# Patient Record
Sex: Male | Born: 1950 | Race: Black or African American | Hispanic: No | Marital: Single | Smoking: Current every day smoker
Health system: Southern US, Community
[De-identification: ages and names within clinical notes are randomized; demographics above are authoritative.]

## PROBLEM LIST (undated history)

## (undated) DIAGNOSIS — E785 Hyperlipidemia, unspecified: Secondary | ICD-10-CM

## (undated) DIAGNOSIS — H269 Unspecified cataract: Secondary | ICD-10-CM

## (undated) DIAGNOSIS — C3492 Malignant neoplasm of unspecified part of left bronchus or lung: Secondary | ICD-10-CM

## (undated) DIAGNOSIS — J449 Chronic obstructive pulmonary disease, unspecified: Secondary | ICD-10-CM

## (undated) DIAGNOSIS — R05 Cough: Secondary | ICD-10-CM

## (undated) DIAGNOSIS — R0600 Dyspnea, unspecified: Secondary | ICD-10-CM

## (undated) DIAGNOSIS — Z86010 Personal history of colonic polyps: Secondary | ICD-10-CM

## (undated) DIAGNOSIS — R0609 Other forms of dyspnea: Secondary | ICD-10-CM

## (undated) DIAGNOSIS — R053 Chronic cough: Secondary | ICD-10-CM

## (undated) DIAGNOSIS — Z972 Presence of dental prosthetic device (complete) (partial): Secondary | ICD-10-CM

## (undated) DIAGNOSIS — R058 Other specified cough: Secondary | ICD-10-CM

## (undated) DIAGNOSIS — K5520 Angiodysplasia of colon without hemorrhage: Secondary | ICD-10-CM

## (undated) DIAGNOSIS — K08109 Complete loss of teeth, unspecified cause, unspecified class: Secondary | ICD-10-CM

## (undated) DIAGNOSIS — C61 Malignant neoplasm of prostate: Secondary | ICD-10-CM

## (undated) HISTORY — DX: Personal history of colonic polyps: Z86.010

## (undated) HISTORY — DX: Hyperlipidemia, unspecified: E78.5

## (undated) HISTORY — DX: Malignant neoplasm of unspecified part of left bronchus or lung: C34.92

## (undated) HISTORY — DX: Unspecified cataract: H26.9

## (undated) HISTORY — DX: Angiodysplasia of colon without hemorrhage: K55.20

## (undated) HISTORY — DX: Malignant neoplasm of prostate: C61

---

## 1998-04-02 ENCOUNTER — Ambulatory Visit (HOSPITAL_COMMUNITY): Admission: RE | Admit: 1998-04-02 | Discharge: 1998-04-02 | Payer: Self-pay | Admitting: Hematology and Oncology

## 1998-04-02 ENCOUNTER — Encounter: Admission: RE | Admit: 1998-04-02 | Discharge: 1998-04-02 | Payer: Self-pay | Admitting: *Deleted

## 1998-04-03 ENCOUNTER — Ambulatory Visit (HOSPITAL_COMMUNITY): Admission: RE | Admit: 1998-04-03 | Discharge: 1998-04-03 | Payer: Self-pay | Admitting: Hematology and Oncology

## 1998-04-08 ENCOUNTER — Encounter: Admission: RE | Admit: 1998-04-08 | Discharge: 1998-04-08 | Payer: Self-pay | Admitting: Hematology and Oncology

## 1998-04-25 ENCOUNTER — Ambulatory Visit (HOSPITAL_COMMUNITY): Admission: RE | Admit: 1998-04-25 | Discharge: 1998-04-25 | Payer: Self-pay | Admitting: *Deleted

## 2001-12-24 ENCOUNTER — Emergency Department (HOSPITAL_COMMUNITY): Admission: EM | Admit: 2001-12-24 | Discharge: 2001-12-24 | Payer: Self-pay | Admitting: *Deleted

## 2001-12-24 ENCOUNTER — Encounter: Payer: Self-pay | Admitting: *Deleted

## 2004-12-28 ENCOUNTER — Encounter: Admission: RE | Admit: 2004-12-28 | Discharge: 2005-01-06 | Payer: Self-pay | Admitting: Family Medicine

## 2007-11-21 ENCOUNTER — Encounter: Payer: Self-pay | Admitting: Internal Medicine

## 2008-04-24 ENCOUNTER — Ambulatory Visit: Payer: Self-pay | Admitting: Internal Medicine

## 2008-04-24 DIAGNOSIS — R195 Other fecal abnormalities: Secondary | ICD-10-CM

## 2008-04-30 HISTORY — PX: COLONOSCOPY: SHX174

## 2008-05-16 ENCOUNTER — Ambulatory Visit: Payer: Self-pay | Admitting: Internal Medicine

## 2008-05-16 ENCOUNTER — Encounter: Payer: Self-pay | Admitting: Internal Medicine

## 2008-05-29 ENCOUNTER — Encounter: Payer: Self-pay | Admitting: Internal Medicine

## 2014-01-28 ENCOUNTER — Encounter: Payer: Self-pay | Admitting: Internal Medicine

## 2014-01-28 ENCOUNTER — Ambulatory Visit (INDEPENDENT_AMBULATORY_CARE_PROVIDER_SITE_OTHER): Payer: Medicaid Other | Admitting: Internal Medicine

## 2014-01-28 VITALS — BP 148/76 | HR 72 | Ht 74.0 in | Wt 198.0 lb

## 2014-01-28 DIAGNOSIS — R590 Localized enlarged lymph nodes: Secondary | ICD-10-CM

## 2014-01-28 DIAGNOSIS — J439 Emphysema, unspecified: Secondary | ICD-10-CM

## 2014-01-28 DIAGNOSIS — R0609 Other forms of dyspnea: Secondary | ICD-10-CM

## 2014-01-28 DIAGNOSIS — R0600 Dyspnea, unspecified: Secondary | ICD-10-CM

## 2014-01-28 DIAGNOSIS — F17200 Nicotine dependence, unspecified, uncomplicated: Secondary | ICD-10-CM

## 2014-01-28 DIAGNOSIS — J438 Other emphysema: Secondary | ICD-10-CM

## 2014-01-28 DIAGNOSIS — J449 Chronic obstructive pulmonary disease, unspecified: Secondary | ICD-10-CM

## 2014-01-28 DIAGNOSIS — R0989 Other specified symptoms and signs involving the circulatory and respiratory systems: Secondary | ICD-10-CM

## 2014-01-28 DIAGNOSIS — Z720 Tobacco use: Secondary | ICD-10-CM | POA: Insufficient documentation

## 2014-01-28 DIAGNOSIS — R599 Enlarged lymph nodes, unspecified: Secondary | ICD-10-CM

## 2014-01-28 MED ORDER — FLUTICASONE FUROATE-VILANTEROL 100-25 MCG/INH IN AEPB: 1.0000 | INHALATION_SPRAY | Freq: Every day | RESPIRATORY_TRACT | Status: DC

## 2014-01-28 NOTE — Assessment & Plan Note (Signed)
Long term smoker with COPD and no comparison imaging from past for comparison. Possibility of bronchogenic CA is significant. No recent acute illness to cause reactive nodes. Rather than waiting for a couple of months for comparison image, or proceeding now with bronchoscopy when airways appear clear and there may not be visible endobronchial disease, it is more appropriate to get PET now. If cold, then therapy can focus on his COPD and smoking cessation.

## 2014-01-28 NOTE — Patient Instructions (Addendum)
Order- Office spirometry- done    Dx COPD with emphysema  Order- Schedule PET scan neck to thigh    Dx left hilar adenopathy, COPD with emphysema (radiologist to compare with CT from [facility] 01/03/14)  Sample x 2 Breo Ellipta Inhaler     One puff then rinse mouth, once time daily  Please stop smoking!

## 2014-01-28 NOTE — Assessment & Plan Note (Signed)
Counseling efforts begun

## 2014-01-28 NOTE — Assessment & Plan Note (Signed)
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  Plan- Sample [facility] for initial trial

## 2014-01-28 NOTE — Progress Notes (Signed)
01/28/14- 25 yoM 1PPD smoker, Pt referred courtesy of  [name], [place] at [facility]  for SOB and abnormal CT. Mr Larry [name] is moderately illiterate and unable to provide much information. He has been noting increasing DOE on hills and stairs for several months, and stable chronic daily cough with white phlegm over the past year. He denies blood, chest pain, fever or weight loss.  He doesn't know of previous lung or heart disease diagnosis, pneumonia or TB exposure. He does know his smoking has caused his problems. 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 Followed for BPH CT 01/03/14 [facility]- Left hilar adenopathy, etiology indeterminate, with patent airways, emphysema, miniscule left upper lobe nodules.  Office Spirometry- 01/28/14- severe obstructive airways disease. FVC 2.47/ 55%, FEV1 1.43/ 42%, FEV1/FVC 0.58, FEF25-75% 0.66/19%  Prior to Admission medications   Medication Sig Start Date End Date Taking? Authorizing Provider  pravastatin (PRAVACHOL) 80 MG tablet Take 80 mg by mouth daily.   Yes Historical Provider, MD  Fluticasone Furoate-Vilanterol (BREO ELLIPTA) 100-25 MCG/INH AEPB Inhale 1 puff into the lungs daily. 01/28/14   Deneise Lever, MD   Past Medical History  Diagnosis Date  . Hyperlipidemia    Past Surgical History  Procedure Laterality Date  . Colon surgery     History reviewed. No pertinent family history. History   Social History  . Marital Status: Single    Spouse Name: N/A    Number of Children: N/A  . Years of Education: N/A   Occupational History  . unemployed    Social History Main Topics  . Smoking status: Current Every Day Smoker -- 1.00 packs/day for 30 years    Types: Cigarettes  . Smokeless tobacco: Never Used  . Alcohol Use: No  . Drug Use: No  . Sexual Activity: Not on file   Other Topics Concern  . Not on file   Social History Narrative  . No narrative on file   ROS-see HPI Constitutional:   No-   weight loss,  night sweats, fevers, chills, fatigue, lassitude. HEENT:   No-  headaches, difficulty swallowing, tooth/dental problems, sore throat,       No-  sneezing, itching, ear ache, nasal congestion, post nasal drip,  CV:  No-   chest pain, orthopnea, PND, swelling in lower extremities, anasarca,                                  dizziness, palpitations Resp: +shortness of breath with exertion or at rest.            +productive cough,  No non-productive cough,  No- coughing up of blood.              No-   change in color of mucus.  No- wheezing.   Skin: No-   rash or lesions. GI:  No-   heartburn, indigestion, abdominal pain, nausea, vomiting, diarrhea,                 change in bowel habits, loss of appetite GU: No-   dysuria, change in color of urine, + urgency or frequency.  No- flank pain. MS:  No-   joint pain or swelling.  No- decreased range of motion.  No- back pain. Neuro-     nothing unusual Psych:  No- change in mood or affect. No depression or anxiety.  No memory loss.  OBJ- Physical Exam General-  Alert, Oriented, Affect-appropriate, Distress- none acute Skin- rash-none, lesions- none, excoriation- none Lymphadenopathy- none Head- +old scar R cheek            Eyes- Gross vision intact, PERRLA, conjunctivae and secretions clear            Ears- Hearing, canals-normal            Nose- Clear, no-Septal dev, mucus, polyps, erosion, perforation             Throat- Mallampati II , mucosa clear , drainage- none, tonsils- atrophic Neck- flexible , trachea midline, no stridor , thyroid nl, carotid no bruit Chest - symmetrical excursion , unlabored           Heart/CV- RRR , no murmur , no gallop  , no rub, nl s1 s2                           - JVD- none , edema- none, stasis changes- none, varices- none           Lung-  +diminished, Wheeze+mild, unlabored cough- none , dullness-none, rub- none           Chest wall-  Abd- tender-no, distended-no, bowel sounds-present, HSM- no Br/ Gen/ Rectal-  Not done, not indicated Extrem- cyanosis- none, clubbing, none, atrophy- none, strength- nl Neuro- grossly intact to observation

## 2014-02-05 ENCOUNTER — Encounter (HOSPITAL_COMMUNITY): Payer: Self-pay

## 2014-02-05 ENCOUNTER — Ambulatory Visit (HOSPITAL_COMMUNITY)
Admission: RE | Admit: 2014-02-05 | Discharge: 2014-02-05 | Disposition: A | Discharge status: Home / Self Care | Payer: Medicaid Other | Source: Ambulatory Visit | Attending: Internal Medicine | Admitting: Internal Medicine

## 2014-02-05 DIAGNOSIS — R599 Enlarged lymph nodes, unspecified: Secondary | ICD-10-CM | POA: Insufficient documentation

## 2014-02-05 DIAGNOSIS — J438 Other emphysema: Secondary | ICD-10-CM | POA: Diagnosis not present

## 2014-02-05 DIAGNOSIS — R918 Other nonspecific abnormal finding of lung field: Secondary | ICD-10-CM | POA: Diagnosis not present

## 2014-02-05 DIAGNOSIS — J439 Emphysema, unspecified: Secondary | ICD-10-CM

## 2014-02-05 LAB — GLUCOSE, CAPILLARY: GLUCOSE-CAPILLARY: 123 mg/dL — AB (ref 70–99)

## 2014-02-05 MED ORDER — FLUDEOXYGLUCOSE F - 18 (FDG) INJECTION: 9.6900 | Freq: Once | INTRAVENOUS | Status: AC | PRN

## 2014-03-18 ENCOUNTER — Telehealth: Payer: Self-pay | Admitting: Internal Medicine

## 2014-03-18 ENCOUNTER — Telehealth: Payer: Self-pay | Admitting: Pulmonary Disease

## 2014-03-18 ENCOUNTER — Ambulatory Visit (INDEPENDENT_AMBULATORY_CARE_PROVIDER_SITE_OTHER): Payer: Medicaid Other | Admitting: Internal Medicine

## 2014-03-18 ENCOUNTER — Encounter: Payer: Self-pay | Admitting: *Deleted

## 2014-03-18 ENCOUNTER — Ambulatory Visit (INDEPENDENT_AMBULATORY_CARE_PROVIDER_SITE_OTHER)
Admission: RE | Admit: 2014-03-18 | Discharge: 2014-03-18 | Disposition: A | Discharge status: Home / Self Care | Payer: Medicaid Other | Source: Ambulatory Visit | Attending: Internal Medicine | Admitting: Internal Medicine

## 2014-03-18 ENCOUNTER — Encounter: Payer: Self-pay | Admitting: Internal Medicine

## 2014-03-18 ENCOUNTER — Other Ambulatory Visit (INDEPENDENT_AMBULATORY_CARE_PROVIDER_SITE_OTHER): Payer: Medicaid Other

## 2014-03-18 VITALS — BP 108/78 | HR 74 | Ht 74.0 in | Wt 194.4 lb

## 2014-03-18 DIAGNOSIS — R911 Solitary pulmonary nodule: Secondary | ICD-10-CM | POA: Diagnosis not present

## 2014-03-18 DIAGNOSIS — R590 Localized enlarged lymph nodes: Secondary | ICD-10-CM

## 2014-03-18 DIAGNOSIS — R918 Other nonspecific abnormal finding of lung field: Secondary | ICD-10-CM

## 2014-03-18 DIAGNOSIS — J449 Chronic obstructive pulmonary disease, unspecified: Secondary | ICD-10-CM

## 2014-03-18 DIAGNOSIS — Z720 Tobacco use: Secondary | ICD-10-CM

## 2014-03-18 DIAGNOSIS — R599 Enlarged lymph nodes, unspecified: Secondary | ICD-10-CM

## 2014-03-18 LAB — CBC WITH DIFFERENTIAL/PLATELET
BASOS PCT: 0.6 % (ref 0.0–3.0)
Basophils Absolute: 0.1 10*3/uL (ref 0.0–0.1)
Eosinophils Absolute: 0.4 10*3/uL (ref 0.0–0.7)
Eosinophils Relative: 4.3 % (ref 0.0–5.0)
HCT: 42 % (ref 39.0–52.0)
Hemoglobin: 13.8 g/dL (ref 13.0–17.0)
LYMPHS ABS: 3.2 10*3/uL (ref 0.7–4.0)
Lymphocytes Relative: 33.1 % (ref 12.0–46.0)
MCHC: 32.8 g/dL (ref 30.0–36.0)
MCV: 81.3 fl (ref 78.0–100.0)
MONO ABS: 0.9 10*3/uL (ref 0.1–1.0)
MONOS PCT: 9.2 % (ref 3.0–12.0)
Neutro Abs: 5.2 10*3/uL (ref 1.4–7.7)
Neutrophils Relative %: 52.8 % (ref 43.0–77.0)
PLATELETS: 214 10*3/uL (ref 150.0–400.0)
RBC: 5.17 Mil/uL (ref 4.22–5.81)
RDW: 15.1 % (ref 11.5–15.5)
WBC: 9.8 10*3/uL (ref 4.0–10.5)

## 2014-03-18 LAB — BASIC METABOLIC PANEL
BUN: 10 mg/dL (ref 6–23)
CO2: 28 meq/L (ref 19–32)
Calcium: 9.3 mg/dL (ref 8.4–10.5)
Chloride: 101 mEq/L (ref 96–112)
Creatinine, Ser: 0.8 mg/dL (ref 0.4–1.5)
GFR: 118.23 mL/min (ref >60.00)
GLUCOSE: 100 mg/dL — AB (ref 70–99)
POTASSIUM: 4.2 meq/L (ref 3.5–5.1)
SODIUM: 139 meq/L (ref 135–145)

## 2014-03-18 LAB — PROTIME-INR
INR: 1.1 ratio — ABNORMAL HIGH (ref 0.8–1.0)
Prothrombin Time: 11.7 s (ref 9.6–13.1)

## 2014-03-18 LAB — APTT: APTT: 28.9 s (ref 23.4–32.7)

## 2014-03-18 MED ORDER — FLUTICASONE FUROATE-VILANTEROL 100-25 MCG/INH IN AEPB
1.0000 | INHALATION_SPRAY | Freq: Every day | RESPIRATORY_TRACT | Status: DC
Start: 2014-03-18 — End: 2014-03-21

## 2014-03-18 NOTE — Assessment & Plan Note (Signed)
Confirmed with abnormal PET scan Dr Gwenette Greet has reviewed at my request and will contact the patient a bput possible  bronchoscopy

## 2014-03-18 NOTE — Patient Instructions (Addendum)
Flu vax  Order- CXR   Dx Lung nodules  Order- lab- Pt/INR. PTT, CBC w diff, BMET, liver enzymes      Dx lung nodules  Script for Breo Ellipta Inhaler   1 puff, then rinse mouth, once daily  I will be asking one of the doctors here to call about scheduling a bronchpscopy test to look in your lungs and try to get biopsy samples of the lung nodules

## 2014-03-18 NOTE — Progress Notes (Signed)
01/28/14- 2 yoM 1PPD smoker, Pt referred courtesy of  [name], [place] at [facility]  for SOB and abnormal CT. Larry [name] is moderately illiterate and unable to provide much information. He has been noting increasing DOE on hills and stairs for several months, and stable chronic daily cough with white phlegm over the past year. He denies blood, chest pain, fever or weight loss.  He doesn't know of previous lung or heart disease diagnosis, pneumonia or TB exposure. He does know his smoking has caused his problems. 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 Followed for BPH CT 01/03/14 [facility]- Left hilar adenopathy, etiology indeterminate, with patent airways, emphysema, miniscule left upper lobe nodules.  Office Spirometry- 01/28/14- severe obstructive airways disease. FVC 2.47/ 55%, FEV1 1.43/ 42%, FEV1/FVC 0.58, FEF25-75% 0.66/19%  03/18/14- 58 yoM 1PPD smoker followed for lung mass, COPD, tobacco use, illiterate FOLLOWS FOR: Pt states he keeps "a cold" year round; review PET scan with patient in detail. Refill needed for [facility] sent to pharmacy as he felt the inhaler helped his breathing.  He denies fever, chest pain, adenopathy, blood. We reviewed PET scan. I asked about problem of blood in stool on problem list. He denies this his current in chart lists benign endoscopies in 2009. PET scan 02/05/14 IMPRESSION:  1. Enlarged hypermetabolic nodule versus lymph node in the left  suprahilar location is concerning for metastatic adenopathy.  2. Small hypermetabolic nodular focus in the left upper lobe could  represent a primary bronchogenic carcinoma.  3. Hypermetabolic nodular focus in the superior segment of the right  lower lobe could represent a second primary neoplasm versus focus of  infection.  4. Low metabolic activity of small mediastinal favors reactive  adenopathy.  5. These central lesions would likely be accessible for tissue  sampling by bronchoscopy.   Electronically Signed  By: Suzy Bouchard M.D.   On: 02/05/2014 09:04  ROS-see HPI Constitutional:   No-   weight loss, night sweats, fevers, chills, fatigue, lassitude. HEENT:   No-  headaches, difficulty swallowing, tooth/dental problems, sore throat,       No-  sneezing, itching, ear ache, nasal congestion, post nasal drip,  CV:  No-   chest pain, orthopnea, PND, swelling in lower extremities, anasarca,                                  dizziness, palpitations Resp: +shortness of breath with exertion or at rest.            +productive cough,  No non-productive cough,  No- coughing up of blood.              No-   change in color of mucus.  No- wheezing.   Skin: No-   rash or lesions. GI:  No-   heartburn, indigestion, abdominal pain, nausea, vomiting,  GU: . MS:  No-   joint pain or swelling.   Neuro-     nothing unusual Psych:  No- change in mood or affect. No depression or anxiety.  No memory loss.  OBJ- Physical Exam General- Alert, Oriented, Affect-appropriate, Distress- none acute, +odor tobacco Skin- rash-none, lesions- none, excoriation- none Lymphadenopathy- none Head- +old scar R cheek            Eyes- Gross vision intact, PERRLA, conjunctivae and secretions clear            Ears- Hearing, canals-normal  Nose- Clear, no-Septal dev, mucus, polyps, erosion, perforation             Throat- Mallampati II , mucosa clear , drainage- none, tonsils- atrophic Neck- flexible , trachea midline, no stridor , thyroid nl, carotid no bruit Chest - symmetrical excursion , unlabored           Heart/CV- RRR , no murmur , no gallop  , no rub, nl s1 s2                           - JVD- none , edema- none, stasis changes- none, varices- none           Lung-  +diminished, Wheeze+-none, unlabored cough- none , dullness-none, rub- none           Chest wall-  Abd-  Br/ Gen/ Rectal- Not done, not indicated Extrem- cyanosis- none, clubbing, none, atrophy- none, strength- nl Neuro-  grossly intact to observation

## 2014-03-18 NOTE — Telephone Encounter (Signed)
Pt is aware that I have scheduled him with CY on 04-19-14 at 11:15am. I have sent a letter as a reminder as well. Also, patient states his [facility] Rx is not at the pharmacy. I have called the Rx to the [facility]. Nothing more needed at this time.

## 2014-03-18 NOTE — Assessment & Plan Note (Signed)
Not sure that these nodules can be reached. CT and clinical history do not indicate obvious endobronchial disease. Guided bronchoscopy, versus needle biopsy, versus observation strategy options discussed with the patient. I discussed bronchoscopy with him. Dr Gwenette Greet is reviewing and can talk with the patient about possible guided bronch, if lung function allows.

## 2014-03-18 NOTE — Telephone Encounter (Signed)
Larry [name], would like to get this pt in to discuss his xray findings.  Dr. Annamaria Boots asked me to see him about the abnormal xray.  He may not understand you, and we may have to send him a letter to get in.  Thanks.

## 2014-03-18 NOTE — Assessment & Plan Note (Signed)
Ongoing. He is not yet motivated to quit

## 2014-03-18 NOTE — Assessment & Plan Note (Signed)
Bronchitis and emphysema features Office Spirometry- 01/28/14- severe obstructive airways disease. FVC 2.47/ 55%, FEV1 1.43/ 42%, FEV1/FVC 0.58, FEF25-75% 0.66/19% He found Breo helpful Plan-Breo Ellipta, flu shot

## 2014-03-19 LAB — HEPATIC FUNCTION PANEL
ALBUMIN: 3.7 g/dL (ref 3.5–5.2)
ALT: 12 U/L (ref 0–53)
AST: 22 U/L (ref 0–37)
Alkaline Phosphatase: 80 U/L (ref 39–117)
Bilirubin, Direct: 0.1 mg/dL (ref 0.0–0.3)
Total Bilirubin: 0.4 mg/dL (ref 0.2–1.2)
Total Protein: 8.4 g/dL — ABNORMAL HIGH (ref 6.0–8.3)

## 2014-03-19 NOTE — Telephone Encounter (Signed)
I called pt. He is scheduled to see [facility] 03/21/14 at 11:45 but will arrive at 11:30. Pt is aware of our address. Will forward to [facility] as an [place]

## 2014-03-21 ENCOUNTER — Encounter: Payer: Self-pay | Admitting: Pulmonary Disease

## 2014-03-21 ENCOUNTER — Telehealth: Payer: Self-pay | Admitting: *Deleted

## 2014-03-21 ENCOUNTER — Ambulatory Visit (INDEPENDENT_AMBULATORY_CARE_PROVIDER_SITE_OTHER): Payer: Medicaid Other | Admitting: Pulmonary Disease

## 2014-03-21 VITALS — BP 140/76 | HR 80 | Temp 98.0°F | Ht 74.0 in | Wt 193.8 lb

## 2014-03-21 DIAGNOSIS — R918 Other nonspecific abnormal finding of lung field: Secondary | ICD-10-CM

## 2014-03-21 DIAGNOSIS — R599 Enlarged lymph nodes, unspecified: Secondary | ICD-10-CM

## 2014-03-21 DIAGNOSIS — R590 Localized enlarged lymph nodes: Secondary | ICD-10-CM

## 2014-03-21 MED ORDER — BUDESONIDE-FORMOTEROL FUMARATE 160-4.5 MCG/ACT IN AERO: 2.0000 | INHALATION_SPRAY | Freq: Two times a day (BID) | RESPIRATORY_TRACT | Status: DC

## 2014-03-21 NOTE — Progress Notes (Signed)
   Subjective:    Patient ID: Larry [name], male    DOB: 1950/06/30, 63 y.o.   MRN: [id number]  HPI The patient is a 63 year old male who I've been asked to see for pulmonary nodules and lymphadenopathy.  The patient has been diagnosed with COPD, and is being followed by Dr. Annamaria Boots.  The patient was found to have an abnormal left hilar density on chest x-ray, and this was verified on CT chest. This scan is not available for my review, but the report shows a left hilar lymph node enlargement, as well as nodular densities in the left upper lobe. The patient has subsequently had a PET scan showing significant uptake in the left hilar lymph node, and 69mm left upper lobe nodule with mildly increased uptake, as well as at 15 mm nodule in the superior segment of the right lower lobe with borderline uptake. The patient tells me that he has an intermittent cough but has not had hemoptysis. Unfortunately he has continued to smoke. He tells me that his weight has been up and down, but he thinks he has lost about 17 pounds this year. He denies any issues with his appetite.   Review of Systems  Constitutional: Negative for fever and unexpected weight change.  HENT: Negative for congestion, dental problem, ear pain, nosebleeds, postnasal drip, rhinorrhea, sinus pressure, sneezing, sore throat and trouble swallowing.   Eyes: Negative for redness and itching.  Respiratory: Positive for cough and shortness of breath. Negative for chest tightness and wheezing.   Cardiovascular: Negative for palpitations and leg swelling.  Gastrointestinal: Negative for nausea and vomiting.  Genitourinary: Negative for dysuria.  Musculoskeletal: Negative for joint swelling.  Skin: Negative for rash.  Neurological: Negative for headaches.  Hematological: Does not bruise/bleed easily.  Psychiatric/Behavioral: Negative for dysphoric mood. The patient is not nervous/anxious.        Objective:   Physical Exam  Constitutional:   Well developed, no acute distress  HENT:  Nares patent without discharge  Oropharynx without exudate, palate and uvula are normal  Eyes:  Perrla, eomi, no scleral icterus  Neck:  No JVD, no TMG  Cardiovascular:  Normal rate, regular rhythm, no rubs or gallops.  No murmurs        Intact distal pulses  Pulmonary :  Mildly decreased breath sounds, no stridor or respiratory distress   No rales, rhonchi, or wheezing  Abdominal:  Soft, nondistended, bowel sounds present.  No tenderness noted.   Musculoskeletal:  No lower extremity edema noted.  Lymph Nodes:  No cervical lymphadenopathy noted  Skin:  No cyanosis noted  Neurologic:  Alert, appropriate, moves all 4 extremities without obvious deficit.        Assessment & Plan:

## 2014-03-21 NOTE — Assessment & Plan Note (Signed)
The patient has left hilar lymphadenopathy on his CT and PET scan that has abnormal uptake. He also has 2 nodules that are 23mm in the left upper lobe and 15 mm in the superior segment of the right lower lobe. I have tried to reviewed the various ways to obtain tissue diagnosis, and unfortunately the nodules are small and in a difficult position. Be a millimeter nodule in the left upper lobe is approachable by navigational bronchoscopy, but its size makes the yield very poor.  The superior segment of the right lower lobe nodule is in a very difficult position, and has a poor yield even with navigational bronchoscopy. I do think the left hilar lymph node is approachable with endobronchial ultrasound, and have recommended this procedure along with traditional bronchoscopy to try and approach the nodules in question. I have discussed with him the procedure in detail, and reviewed the potential complications. The patient is agreeable to this approach.

## 2014-03-21 NOTE — Patient Instructions (Signed)
Will arrange for bronchoscopy to get a biopsy of your abnormal lung spot.  Will check the schedule, and call you.  Work on stopping smoking.

## 2014-03-21 NOTE — Telephone Encounter (Signed)
Pt was seen in the office by [facility] today. Pt was told by the pharmacy BREO is not covered under his insurance.  Spoke with CDY. He wants to change pt to symbicort 160 2 puffs BID.  Explained inhaler and technique to pt. RX sent to the pharmacy.

## 2014-03-22 ENCOUNTER — Encounter: Payer: Self-pay | Admitting: Pulmonary Disease

## 2014-03-22 ENCOUNTER — Telehealth: Payer: Self-pay | Admitting: Pulmonary Disease

## 2014-03-22 NOTE — Telephone Encounter (Signed)
Please see if there is an EBUS slot at [place] at 730 am On NOV 9th.  Let me know and I can give you pt info to make contact.

## 2014-03-22 NOTE — Telephone Encounter (Signed)
Will give to [facility] on Monday 03/25/14 Dawne J Law

## 2014-03-25 ENCOUNTER — Encounter (HOSPITAL_COMMUNITY): Payer: Self-pay | Admitting: *Deleted

## 2014-03-25 NOTE — Telephone Encounter (Signed)
EBUS @[facility]  04/02/14@7 :30am Joellen Jersey

## 2014-03-29 NOTE — Progress Notes (Signed)
Quick Note:  ATC pt. Line rang then stated " person you are trying to call is unavailable." WCB. ______

## 2014-03-29 NOTE — Progress Notes (Signed)
Quick Note:  ATC pt. Line rang then stated " person you are trying to call is unavailable." WCB.  ______

## 2014-04-07 NOTE — Anesthesia Preprocedure Evaluation (Addendum)
Anesthesia Evaluation  Patient identified by MRN, date of birth, ID band Patient awake    Reviewed: Allergy & Precautions, H&P , NPO status , Patient's Chart, lab work & pertinent test results  Airway Mallampati: II       Dental  (+) Edentulous Upper, Edentulous Lower   Pulmonary COPDCurrent Smoker (30+ pack year),  PFT FVC 42% FEV25 only 22% both obstructive and restrictive, 2nd COPD smoker breath sounds clear to auscultation        Cardiovascular Rhythm:Regular     Neuro/Psych    GI/Hepatic   Endo/Other    Renal/GU      Musculoskeletal   Abdominal (+)  Abdomen: soft.    Peds  Hematology   Anesthesia Other Findings   Reproductive/Obstetrics                            Anesthesia Physical Anesthesia Plan  ASA: III  Anesthesia Plan: General   Post-op Pain Management:    Induction: Intravenous  Airway Management Planned: Oral ETT  Additional Equipment:   Intra-op Plan:   Post-operative Plan:   Informed Consent: I have reviewed the patients History and Physical, chart, labs and discussed the procedure including the risks, benefits and alternatives for the proposed anesthesia with the patient or authorized representative who has indicated his/her understanding and acceptance.     Plan Discussed with:   Anesthesia Plan Comments: (For mediastinal node BX, good IV, EKG if not done, 8.5 0r larger ET tube)        Anesthesia Quick Evaluation

## 2014-04-08 ENCOUNTER — Ambulatory Visit (HOSPITAL_COMMUNITY)
Admission: RE | Admit: 2014-04-08 | Discharge: 2014-04-08 | Disposition: A | Discharge status: Home / Self Care | Payer: Medicaid Other | Source: Ambulatory Visit | Attending: Pulmonary Disease | Admitting: Pulmonary Disease

## 2014-04-08 ENCOUNTER — Encounter (HOSPITAL_COMMUNITY)
Admission: RE | Disposition: A | Discharge status: Home / Self Care | Payer: Self-pay | Source: Ambulatory Visit | Attending: Pulmonary Disease

## 2014-04-08 ENCOUNTER — Ambulatory Visit (HOSPITAL_COMMUNITY): Payer: Medicaid Other | Admitting: Anesthesiology

## 2014-04-08 ENCOUNTER — Encounter (HOSPITAL_COMMUNITY): Payer: Self-pay | Admitting: *Deleted

## 2014-04-08 DIAGNOSIS — R590 Localized enlarged lymph nodes: Secondary | ICD-10-CM | POA: Diagnosis present

## 2014-04-08 DIAGNOSIS — J449 Chronic obstructive pulmonary disease, unspecified: Secondary | ICD-10-CM | POA: Insufficient documentation

## 2014-04-08 DIAGNOSIS — R918 Other nonspecific abnormal finding of lung field: Secondary | ICD-10-CM | POA: Diagnosis not present

## 2014-04-08 DIAGNOSIS — F17210 Nicotine dependence, cigarettes, uncomplicated: Secondary | ICD-10-CM | POA: Diagnosis not present

## 2014-04-08 DIAGNOSIS — R599 Enlarged lymph nodes, unspecified: Secondary | ICD-10-CM | POA: Insufficient documentation

## 2014-04-08 HISTORY — DX: Chronic obstructive pulmonary disease, unspecified: J44.9

## 2014-04-08 HISTORY — PX: ENDOBRONCHIAL ULTRASOUND: SHX5096

## 2014-04-08 SURGERY — ENDOBRONCHIAL ULTRASOUND (EBUS)
Anesthesia: General | Laterality: Bilateral

## 2014-04-08 MED ORDER — DEXAMETHASONE SODIUM PHOSPHATE 10 MG/ML IJ SOLN
INTRAMUSCULAR | Status: AC
  Filled 2014-04-08: qty 1

## 2014-04-08 MED ORDER — SUCCINYLCHOLINE CHLORIDE 20 MG/ML IJ SOLN
INTRAMUSCULAR | Status: DC | PRN
  Administered 2014-04-08: 100 mg via INTRAVENOUS

## 2014-04-08 MED ORDER — DEXAMETHASONE SODIUM PHOSPHATE 10 MG/ML IJ SOLN
INTRAMUSCULAR | Status: DC | PRN
  Administered 2014-04-08: 10 mg via INTRAVENOUS

## 2014-04-08 MED ORDER — LIDOCAINE HCL (CARDIAC) 20 MG/ML IV SOLN
INTRAVENOUS | Status: AC
  Filled 2014-04-08: qty 5

## 2014-04-08 MED ORDER — PROMETHAZINE HCL 25 MG/ML IJ SOLN: 6.2500 mg | INTRAMUSCULAR | Status: DC | PRN

## 2014-04-08 MED ORDER — FENTANYL CITRATE 0.05 MG/ML IJ SOLN: 25.0000 ug | INTRAMUSCULAR | Status: DC | PRN

## 2014-04-08 MED ORDER — MIDAZOLAM HCL 2 MG/2ML IJ SOLN
INTRAMUSCULAR | Status: AC
  Filled 2014-04-08: qty 2

## 2014-04-08 MED ORDER — MEPERIDINE HCL 100 MG/ML IJ SOLN
6.2500 mg | INTRAMUSCULAR | Status: DC | PRN
Start: 2014-04-08 — End: 2014-04-08

## 2014-04-08 MED ORDER — LACTATED RINGERS IV SOLN
INTRAVENOUS | Status: DC
  Administered 2014-04-08: 1000 mL via INTRAVENOUS

## 2014-04-08 MED ORDER — ONDANSETRON HCL 4 MG/2ML IJ SOLN
INTRAMUSCULAR | Status: DC | PRN
  Administered 2014-04-08: 4 mg via INTRAVENOUS

## 2014-04-08 MED ORDER — PROPOFOL 10 MG/ML IV BOLUS
INTRAVENOUS | Status: AC
  Filled 2014-04-08: qty 20

## 2014-04-08 MED ORDER — FENTANYL CITRATE 0.05 MG/ML IJ SOLN
INTRAMUSCULAR | Status: AC
  Filled 2014-04-08: qty 2

## 2014-04-08 MED ORDER — FENTANYL CITRATE 0.05 MG/ML IJ SOLN
INTRAMUSCULAR | Status: DC | PRN
  Administered 2014-04-08 (×4): 50 ug via INTRAVENOUS

## 2014-04-08 MED ORDER — LIDOCAINE HCL (PF) 2 % IJ SOLN
INTRAMUSCULAR | Status: DC | PRN
  Administered 2014-04-08: 30 mg via INTRADERMAL

## 2014-04-08 MED ORDER — PROPOFOL 10 MG/ML IV BOLUS
INTRAVENOUS | Status: DC | PRN
  Administered 2014-04-08: 70 mg via INTRAVENOUS
  Administered 2014-04-08: 130 mg via INTRAVENOUS

## 2014-04-08 MED ORDER — ONDANSETRON HCL 4 MG/2ML IJ SOLN
INTRAMUSCULAR | Status: AC
  Filled 2014-04-08: qty 2

## 2014-04-08 NOTE — H&P (View-Only) (Signed)
   Subjective:    Patient ID: Larry [name], male    DOB: 1950-07-18, 63 y.o.   MRN: [id number]  HPI The patient is a 63 year old male who I've been asked to see for pulmonary nodules and lymphadenopathy.  The patient has been diagnosed with COPD, and is being followed by Dr. Annamaria Boots.  The patient was found to have an abnormal left hilar density on chest x-ray, and this was verified on CT chest. This scan is not available for my review, but the report shows a left hilar lymph node enlargement, as well as nodular densities in the left upper lobe. The patient has subsequently had a PET scan showing significant uptake in the left hilar lymph node, and 25mm left upper lobe nodule with mildly increased uptake, as well as at 15 mm nodule in the superior segment of the right lower lobe with borderline uptake. The patient tells me that he has an intermittent cough but has not had hemoptysis. Unfortunately he has continued to smoke. He tells me that his weight has been up and down, but he thinks he has lost about 17 pounds this year. He denies any issues with his appetite.   Review of Systems  Constitutional: Negative for fever and unexpected weight change.  HENT: Negative for congestion, dental problem, ear pain, nosebleeds, postnasal drip, rhinorrhea, sinus pressure, sneezing, sore throat and trouble swallowing.   Eyes: Negative for redness and itching.  Respiratory: Positive for cough and shortness of breath. Negative for chest tightness and wheezing.   Cardiovascular: Negative for palpitations and leg swelling.  Gastrointestinal: Negative for nausea and vomiting.  Genitourinary: Negative for dysuria.  Musculoskeletal: Negative for joint swelling.  Skin: Negative for rash.  Neurological: Negative for headaches.  Hematological: Does not bruise/bleed easily.  Psychiatric/Behavioral: Negative for dysphoric mood. The patient is not nervous/anxious.        Objective:   Physical Exam  Constitutional:   Well developed, no acute distress  HENT:  Nares patent without discharge  Oropharynx without exudate, palate and uvula are normal  Eyes:  Perrla, eomi, no scleral icterus  Neck:  No JVD, no TMG  Cardiovascular:  Normal rate, regular rhythm, no rubs or gallops.  No murmurs        Intact distal pulses  Pulmonary :  Mildly decreased breath sounds, no stridor or respiratory distress   No rales, rhonchi, or wheezing  Abdominal:  Soft, nondistended, bowel sounds present.  No tenderness noted.   Musculoskeletal:  No lower extremity edema noted.  Lymph Nodes:  No cervical lymphadenopathy noted  Skin:  No cyanosis noted  Neurologic:  Alert, appropriate, moves all 4 extremities without obvious deficit.        Assessment & Plan:

## 2014-04-08 NOTE — Transfer of Care (Signed)
Immediate Anesthesia Transfer of Care Note  Patient: Larry [name]  Procedure(s) Performed: Procedure(s) (LRB): ENDOBRONCHIAL ULTRASOUND (Bilateral)  Patient Location: PACU  Anesthesia Type: General  Level of Consciousness: sedated, patient cooperative and responds to stimulation  Airway & Oxygen Therapy: Patient Spontanous Breathing and Patient connected to face mask oxgen  Post-op Assessment: Report given to PACU RN and Post -op Vital signs reviewed and stable  Post vital signs: Reviewed and stable  Complications: No apparent anesthesia complications

## 2014-04-08 NOTE — Progress Notes (Signed)
Video Bronchoscopy done  Post EBUS procedure  Intervention Bronchial washing done Intervention Bronchial brushing done

## 2014-04-08 NOTE — Op Note (Signed)
NAME:  CHASON, MCIVER NO.:  [id number]  MEDICAL RECORD NO.:  [id number]  LOCATION:                               FACILITY:  [facility]  PHYSICIAN:  Kathee Delton, MD,FCCPDATE OF BIRTH:  13-[birth year]1949  DATE OF PROCEDURE:  04/08/2014 DATE OF DISCHARGE:  04/08/2014                              OPERATIVE REPORT   PROCEDURE:  Flexible fiberoptic bronchoscopy with biopsy and endobronchial ultrasound.  OPERATOR:  Kathee Delton, MD, FCCP  INDICATION FOR THE PROCEDURE:  Left hilar mass/adenopathy with pulmonary nodules.  ANESTHESIA:  General with endotracheal intubation.  DESCRIPTION OF PROCEDURE:  After obtaining informed consent and under close cardiopulmonary monitoring, the patient underwent general anesthesia with endotracheal intubation for the procedure.  The standard fiberoptic bronchoscope was passed through the endotracheal tube and an examination of the airways was done which showed no endobronchial abnormality.  The scope was then removed and the endobronchial ultrasound scope was passed through the endotracheal tube and into the left tracheobronchial tree.  The scope was passed into the left upper lobe where the lymph node/mass in question were easily imaged with ultrasound.  However, when the biopsy needle was passed through the scope, I was unable to flex the ultrasound probe to the proper location for imaging because of increased stiffness of the scope.  Multiple attempts were tried with various maneuvers, however, there was no way to get the ultrasound probe into the proper location with the biopsy needle within the channel.  Biopsy of the 10L station was not able to be done. The scope was then passed into the right tracheobronchial tree and into the superior segment of the right lower lobe where brushes and lavage were done in the area of the abnormality seen on PET scanning.  Overall, the patient tolerated the procedure well and there were  no complications.     Kathee Delton, MD,FCCP     [facility]/MEDQ  D:  04/08/2014  T:  04/08/2014  Job:  [id number]

## 2014-04-08 NOTE — Discharge Instructions (Signed)
Flexible Bronchoscopy, Care After These instructions give you information on caring for yourself after your procedure. Your doctor may also give you more specific instructions. Call your doctor if you have any problems or questions after your procedure. HOME CARE  Do not eat or drink anything for 2 hours after your procedure. If you try to eat or drink before the medicine wears off, food or drink could go into your lungs. You could also burn yourself.  After 2 hours have passed and when you can cough and gag normally, you may eat soft food and drink liquids slowly.  The day after the test, you may eat your normal diet.  You may do your normal activities.  Keep all doctor visits. GET HELP RIGHT AWAY IF:  You get more and more short of breath.  You get light-headed.  You feel like you are going to pass out (faint).  You have chest pain.  You have new problems that worry you.  You cough up more than a little blood.  You cough up more blood than before. MAKE SURE YOU:  Understand these instructions.  Will watch your condition.  Will get help right away if you are not doing well or get worse. Document Released: 03/14/2009 Document Revised: 05/22/2013 Document Reviewed: 01/19/2013 [facility] Patient Information 2015 [place], [place]. This information is not intended to replace advice given to you by your health care provider. Make sure you discuss any questions you have with your health care provider.   General Anesthesia, Care After Refer to this sheet in the next few weeks. These instructions provide you with information on caring for yourself after your procedure. Your health care provider may also give you more specific instructions. Your treatment has been planned according to current medical practices, but problems sometimes occur. Call your health care provider if you have any problems or questions after your procedure. WHAT TO EXPECT AFTER THE PROCEDURE After the procedure,  it is typical to experience:  Sleepiness.  Nausea and vomiting. HOME CARE INSTRUCTIONS  For the first 24 hours after general anesthesia:  Have a responsible person with you.  Do not drive a car. If you are alone, do not take public transportation.  Do not drink alcohol.  Do not take medicine that has not been prescribed by your health care provider.  Do not sign important papers or make important decisions.  You may resume a normal diet and activities as directed by your health care provider.  Change bandages (dressings) as directed.  If you have questions or problems that seem related to general anesthesia, call the hospital and ask for the anesthetist or anesthesiologist on call. SEEK MEDICAL CARE IF:  You have nausea and vomiting that continue the day after anesthesia.  You develop a rash. SEEK IMMEDIATE MEDICAL CARE IF:   You have difficulty breathing.  You have chest pain.  You have any allergic problems. Document Released: 08/23/2000 Document Revised: 05/22/2013 Document Reviewed: 11/30/2012 [facility] Patient Information 2015 [place], [place]. This information is not intended to replace advice given to you by your health care provider. Make sure you discuss any questions you have with your health care provider.

## 2014-04-08 NOTE — Interval H&P Note (Signed)
History and Physical Interval Note:  04/08/2014 7:30 AM  Larry [name]  has presented today for surgery, with the diagnosis of lung mass  The various methods of treatment have been discussed with the patient and family. After consideration of risks, benefits and other options for treatment, the patient has consented to  Procedure(s): ENDOBRONCHIAL ULTRASOUND (Bilateral) as a surgical intervention .  The patient's history has been reviewed, patient examined, no change in status, stable for surgery.  I have reviewed the patient's chart and labs.  Questions were answered to the patient's satisfaction.     Kathee Delton

## 2014-04-08 NOTE — Op Note (Signed)
Dictation #:  [id number]

## 2014-04-08 NOTE — Anesthesia Postprocedure Evaluation (Signed)
  Anesthesia Post-op Note  Patient: Larry [name]  Procedure(s) Performed: Procedure(s): ENDOBRONCHIAL ULTRASOUND (Bilateral)  Patient Location: PACU  Anesthesia Type:General  Level of Consciousness: awake  Airway and Oxygen Therapy: Patient Spontanous Breathing  Post-op Pain: none  Post-op Assessment: Post-op Vital signs reviewed and Patient's Cardiovascular Status Stable  Post-op Vital Signs: Reviewed and stable  Last Vitals:  Filed Vitals:   04/08/14 0910  BP: 143/85  Pulse: 88  Temp:   Resp: 13    Complications: No apparent anesthesia complications

## 2014-04-08 NOTE — Op Note (Deleted)
NAME:  Larry [name], Larry [name] NO.:  [id number]  MEDICAL RECORD NO.:  [id number]  LOCATION:                               FACILITY:  [facility]  PHYSICIAN:  Kathee Delton, MD,FCCPDATE OF BIRTH:  02-20[birth year]50  DATE OF PROCEDURE:  04/08/2014 DATE OF DISCHARGE:  04/08/2014                              OPERATIVE REPORT   PROCEDURE:  Flexible fiberoptic bronchoscopy with biopsy and endobronchial ultrasound.  OPERATOR:  Kathee Delton, MD, FCCP  INDICATION FOR THE PROCEDURE:  Left hilar mass/adenopathy with pulmonary nodules.  ANESTHESIA:  General with endotracheal intubation.  DESCRIPTION OF PROCEDURE:  After obtaining informed consent and under close cardiopulmonary monitoring, the patient underwent general anesthesia with endotracheal intubation for the procedure.  The standard fiberoptic bronchoscope was passed through the endotracheal tube and an examination of the airways was done which showed no endobronchial abnormality.  The scope was then removed and the endobronchial ultrasound scope was passed through the endotracheal tube and into the left tracheobronchial tree.  The scope was passed into the left upper lobe where the lymph node/mass in question were easily imaged with ultrasound.  However, when the biopsy needle was passed through the scope, I was unable to flex the ultrasound probe to the proper location for imaging because of increased stiffness of the scope.  Multiple attempts were tried with various maneuvers, however, there was no way to get the ultrasound probe into the proper location with the biopsy needle within the channel.  Biopsy of the 10L station was not able to be done. The scope was then passed into the right tracheobronchial tree and into the superior segment of the right lower lobe where brushes and lavage were done in the area of the abnormality seen on PET scanning.  Overall, the patient tolerated the procedure well and there were  no complications.     Kathee Delton, MD,FCCP     [facility]/MEDQ  D:  04/08/2014  T:  04/08/2014  Job:  [id number]

## 2014-04-09 ENCOUNTER — Encounter (HOSPITAL_COMMUNITY): Payer: Self-pay | Admitting: Pulmonary Disease

## 2014-04-10 ENCOUNTER — Telehealth: Payer: Self-pay | Admitting: Pulmonary Disease

## 2014-04-10 NOTE — Telephone Encounter (Signed)
Called spoke with pt sister. Appt scheduled for 11/13 to see [facility] to discuss results. Nothing further needed

## 2014-04-12 ENCOUNTER — Encounter: Payer: Self-pay | Admitting: Pulmonary Disease

## 2014-04-12 ENCOUNTER — Encounter (INDEPENDENT_AMBULATORY_CARE_PROVIDER_SITE_OTHER): Payer: Self-pay

## 2014-04-12 ENCOUNTER — Ambulatory Visit (INDEPENDENT_AMBULATORY_CARE_PROVIDER_SITE_OTHER): Payer: Medicaid Other | Admitting: Pulmonary Disease

## 2014-04-12 VITALS — BP 140/78 | HR 78 | Temp 99.0°F | Ht 74.0 in | Wt 197.6 lb

## 2014-04-12 DIAGNOSIS — R918 Other nonspecific abnormal finding of lung field: Secondary | ICD-10-CM

## 2014-04-12 DIAGNOSIS — R599 Enlarged lymph nodes, unspecified: Secondary | ICD-10-CM

## 2014-04-12 DIAGNOSIS — R590 Localized enlarged lymph nodes: Secondary | ICD-10-CM

## 2014-04-12 NOTE — Patient Instructions (Signed)
Will schedule for special ct chest to get the GPS coordinates for your upcoming procedure. Will speak with my partner and get you scheduled for the GPS bronchoscopy.

## 2014-04-12 NOTE — Assessment & Plan Note (Signed)
The patient has had endobronchial ultrasound with very easy visualization of the lymph node mass in question. However, once the biopsy needle was placed into the channel of the scope, there was not adequate and to acquire the ultrasound image again for accurate biopsy. We obviously still need tissue diagnosis, and I've discussed with him trying navigational bronchoscopy versus thoracoscopic surgical biopsy. After a long discussion, the the patient wishes to try navigational bronchoscopy, but understands the yield is not 100%. He will need a super dimension CT chest, and will arrange for his procedure. This will also allow [place] to biopsy the abnormality in the superior segment of the right lower lobe.

## 2014-04-12 NOTE — Progress Notes (Signed)
   Subjective:    Patient ID: Larry [name], male    DOB: 11-07-1950, 63 y.o.   MRN: [id number]  HPI The patient comes in today for follow-up of his recent endobronchial ultrasound. The lymph node mass in the 10 L position was easily identified by ultrasound, but unfortunately there was not adequate flexion of the scope once the biopsy needle was entered into the channel. The patient also had BAL and brushings of his sleep or segment right lower lobe, with only inflammatory cells being noted. I have reviewed the results with him in detail.   Review of Systems  Constitutional: Negative for fever and unexpected weight change.  HENT: Negative for congestion, dental problem, ear pain, nosebleeds, postnasal drip, rhinorrhea, sinus pressure, sneezing, sore throat and trouble swallowing.   Eyes: Negative for redness and itching.  Respiratory: Positive for cough. Negative for chest tightness, shortness of breath and wheezing.   Cardiovascular: Negative for palpitations and leg swelling.  Gastrointestinal: Negative for nausea and vomiting.  Genitourinary: Negative for dysuria.  Musculoskeletal: Negative for joint swelling.  Skin: Negative for rash.  Neurological: Negative for headaches.  Hematological: Does not bruise/bleed easily.  Psychiatric/Behavioral: Negative for dysphoric mood. The patient is not nervous/anxious.        Objective:   Physical Exam Thin male in no acute distress Nose without purulence or discharge noted Neck without lymphadenopathy or thyromegaly Lower extremities without edema, no cyanosis Alert and oriented, moves all 4 extremities.       Assessment & Plan:

## 2014-04-15 ENCOUNTER — Ambulatory Visit (INDEPENDENT_AMBULATORY_CARE_PROVIDER_SITE_OTHER)
Admission: RE | Admit: 2014-04-15 | Discharge: 2014-04-15 | Disposition: A | Discharge status: Home / Self Care | Payer: Medicaid Other | Source: Ambulatory Visit | Attending: Pulmonary Disease | Admitting: Pulmonary Disease

## 2014-04-15 DIAGNOSIS — R918 Other nonspecific abnormal finding of lung field: Secondary | ICD-10-CM

## 2014-04-15 DIAGNOSIS — R599 Enlarged lymph nodes, unspecified: Secondary | ICD-10-CM

## 2014-04-15 DIAGNOSIS — R590 Localized enlarged lymph nodes: Secondary | ICD-10-CM

## 2014-04-19 ENCOUNTER — Other Ambulatory Visit: Payer: Self-pay | Admitting: Emergency Medicine

## 2014-04-19 ENCOUNTER — Ambulatory Visit (INDEPENDENT_AMBULATORY_CARE_PROVIDER_SITE_OTHER): Payer: Medicaid Other | Admitting: Internal Medicine

## 2014-04-19 ENCOUNTER — Encounter: Payer: Self-pay | Admitting: Internal Medicine

## 2014-04-19 VITALS — BP 122/84 | HR 75 | Ht 74.0 in | Wt 197.6 lb

## 2014-04-19 DIAGNOSIS — Z720 Tobacco use: Secondary | ICD-10-CM

## 2014-04-19 DIAGNOSIS — R918 Other nonspecific abnormal finding of lung field: Secondary | ICD-10-CM

## 2014-04-19 DIAGNOSIS — J449 Chronic obstructive pulmonary disease, unspecified: Secondary | ICD-10-CM

## 2014-04-19 NOTE — Assessment & Plan Note (Signed)
Decreased smoking- encouraged. Fair control.

## 2014-04-19 NOTE — Assessment & Plan Note (Signed)
RML process improved so was inflammatory Now scheduled for navigational bronch/ Dr Gwenette Greet to try again to sample the most concerning area.  The patient does not read or write, by self-report, and is unclear about when, where and for which tasks he is to present next week (lab, bronchoscopy). Plan- We will have [facility] meet and review schedule for next week with him and  His neice. He will follow with Dr Gwenette Greet. I will see him again as needed.

## 2014-04-19 NOTE — Patient Instructions (Addendum)
Plan to follow with Dr Gwenette Greet as your lung doctor for now, until he directs otherwise.  Our Patient Care Coordinators will help with instructions so you know when to be where for your blood tests on Monday 10/23  At 11:00AM at [facility] and your Bronchoscopy in the operating room at [facility] with Dr Gwenette Greet on Wednesday 10/25.   Please call if you need help

## 2014-04-19 NOTE — Progress Notes (Signed)
01/28/14- 45 yoM 1PPD smoker, Pt referred courtesy of  [name], [place] at [facility]  for SOB and abnormal CT. Larry [name] is moderately illiterate and unable to provide much information. He has been noting increasing DOE on hills and stairs for several months, and stable chronic daily cough with white phlegm over the past year. He denies blood, chest pain, fever or weight loss.  He doesn't know of previous lung or heart disease diagnosis, pneumonia or TB exposure. He does know his smoking has caused his problems. 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 Followed for BPH CT 01/03/14 [facility]- Left hilar adenopathy, etiology indeterminate, with patent airways, emphysema, miniscule left upper lobe nodules.  Office Spirometry- 01/28/14- severe obstructive airways disease. FVC 2.47/ 55%, FEV1 1.43/ 42%, FEV1/FVC 0.58, FEF25-75% 0.66/19%  03/18/14- 11 yoM 1PPD smoker followed for lung mass, COPD, tobacco use, illiterate FOLLOWS FOR: Pt states he keeps "a cold" year round; review PET scan with patient in detail. Refill needed for [facility] sent to pharmacy as he felt the inhaler helped his breathing.  He denies fever, chest pain, adenopathy, blood. We reviewed PET scan. I asked about problem of blood in stool on problem list. He denies this his current in chart lists benign endoscopies in 2009. PET scan 02/05/14 IMPRESSION:  1. Enlarged hypermetabolic nodule versus lymph node in the left  suprahilar location is concerning for metastatic adenopathy.  2. Small hypermetabolic nodular focus in the left upper lobe could  represent a primary bronchogenic carcinoma.  3. Hypermetabolic nodular focus in the superior segment of the right  lower lobe could represent a second primary neoplasm versus focus of  infection.  4. Low metabolic activity of small mediastinal favors reactive  adenopathy.  5. These central lesions would likely be accessible for tissue  sampling by bronchoscopy.   Electronically Signed  By: Suzy Bouchard M.D.   On: 02/05/2014 09:04  04/12/14-Dr Clance HPI The patient comes in today for follow-up of his recent endobronchial ultrasound. The lymph node mass in the 10 L position was easily identified by ultrasound, but unfortunately there was not adequate flexion of the scope once the biopsy needle was entered into the channel. The patient also had BAL and brushings of his superior segment right lower lobe, with only inflammatory cells being noted. I have reviewed the results with him in detail.  04/19/14- 13 yoM 1PPD smoker followed for lung mass, COPD, tobacco use, illiterate S/P EBUS bronchoscopy by Dr Gwenette Greet, limited samples due to difficult airway anatomy.  FOLLOWS FOR: seen by [facility] for bronch-pt states he is doing well.  Niece here Smoking less. Denies cough, chest pain, acute discomfort He doesn't understand scheduling for next week- labs then navigational bronchoscopy. CT Super D 04/15/14 IMPRESSION: 1. 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 This is intimately associated with the left pulmonary artery. 2. This superior segment right lower lobe opacity/nodule has nearly completely resolved and was likely infectious. 3. An area of endobronchial soft tissue density measures 5 mm within the posterior left upper lobe and is felt to be similar to on the prior exam. This causes adjacent bronchiectasis and could represent an endobronchial synchronous neoplasm or an area of mucous plugging. Electronically Signed  By: Abigail Miyamoto M.D.  On: 04/15/2014 17:36  ROS-see HPI Constitutional:   No-   weight loss, night sweats, fevers, chills, fatigue, lassitude. HEENT:   No-  headaches, difficulty swallowing, tooth/dental problems, sore throat,  No-  sneezing, itching, ear ache, nasal congestion, post nasal drip,  CV:  No-   chest pain, orthopnea, PND, swelling in lower extremities,  anasarca,                                  dizziness, palpitations Resp: +shortness of breath with exertion or at rest.            +productive cough,  No non-productive cough,  No- coughing up of blood.              No-   change in color of mucus.  No- wheezing.   Skin: No-   rash or lesions. GI:  No-   heartburn, indigestion, abdominal pain, nausea, vomiting,  GU: . MS:  No-   joint pain or swelling.   Neuro-     nothing unusual Psych:  No- change in mood or affect. No depression or anxiety.  No memory loss.  OBJ- Physical Exam General- Alert, Oriented, Affect-appropriate, Distress- none acute,  Skin- rash-none, lesions- none, excoriation- none Lymphadenopathy- none Head- +old scar R cheek            Eyes- Gross vision intact, PERRLA, conjunctivae and secretions clear            Ears- Hearing, canals-normal            Nose- Clear, no-Septal dev, mucus, polyps, erosion, perforation             Throat- Mallampati II , mucosa clear , drainage- none, tonsils- atrophic Neck- flexible , trachea midline, no stridor , thyroid nl, carotid no bruit Chest - symmetrical excursion , unlabored           Heart/CV- RRR , no murmur , no gallop  , no rub, nl s1 s2                           - JVD- none , edema- none, stasis changes- none, varices- none           Lung-  +diminished, Wheeze-none, unlabored cough- none , dullness-none,                rub- none           Chest wall-  Abd-  Br/ Gen/ Rectal- Not done, not indicated Extrem- cyanosis- none, clubbing, none, atrophy- none, strength- nl Neuro- grossly intact to observation

## 2014-04-19 NOTE — Assessment & Plan Note (Signed)
He has reduced but not stopped. Cessation goal.

## 2014-04-20 NOTE — Pre-Procedure Instructions (Addendum)
Larry [name]  04/20/2014   Your procedure is scheduled on:  November 25  Report to [facility] Admitting at 08:20 AM.  Call this number if you have problems the morning of surgery: [phone]   Remember:   Do not eat food or drink liquids after midnight.   Take these medicines the morning of surgery with A SIP OF WATER: Symbicort     STOP Fish Oil today   STOP/ Do not take Aspirin, Aleve, Naproxen, Advil, Ibuprofen, Motrin, Vitamins, Herbs, or Supplements starting today   Do not wear jewelry, make-up or nail polish.  Do not wear lotions, powders, or perfumes. You may wear deodorant.  Do not shave 48 hours prior to surgery. Men may shave face and neck.  Do not bring valuables to the hospital.  [facility] is not responsible  for any belongings or valuables.               Contacts, dentures or bridgework may not be worn into surgery.  Leave suitcase in the car. After surgery it may be brought to your room.  For patients admitted to the hospital, discharge time is determined by your treatment team.               Patients discharged the day of surgery will not be allowed to drive  home.  Name and phone number of your driver: Family/ Friend  Special Instructions: [place] - Preparing for Surgery  Before surgery, you can play an important role.  Because skin is not sterile, your skin needs to be as free of germs as possible.  You can reduce the number of germs on you skin by washing with CHG (chlorahexidine gluconate) soap before surgery.  CHG is an antiseptic cleaner which kills germs and bonds with the skin to continue killing germs even after washing.  Please DO NOT use if you have an allergy to CHG or antibacterial soaps.  If your skin becomes reddened/irritated stop using the CHG and inform your nurse when you arrive at Short Stay.  Do not shave (including legs and underarms) for at least 48 hours prior to the first CHG shower.  You may shave your face.  Please  follow these instructions carefully:   1.  Shower with CHG Soap the night before surgery and the morning of Surgery.  2.  If you choose to wash your hair, wash your hair first as usual with your normal shampoo.  3.  After you shampoo, rinse your hair and body thoroughly to remove the shampoo.  4.  Use CHG as you would any other liquid soap.  You can apply CHG directly to the skin and wash gently with scrungie or a clean washcloth.  5.  Apply the CHG Soap to your body ONLY FROM THE NECK DOWN.  Do not use on open wounds or open sores.  Avoid contact with your eyes, ears, mouth and genitals (private parts).  Wash genitals (private parts) with your normal soap.  6.  Wash thoroughly, paying special attention to the area where your surgery will be performed.  7.  Thoroughly rinse your body with warm water from the neck down.  8.  DO NOT shower/wash with your normal soap after using and rinsing off the CHG Soap.  9.  Pat yourself dry with a clean towel.            10.  Wear clean pajamas.  11.  Place clean sheets on your bed the night of your first shower and do not sleep with pets.  Day of Surgery  Do not apply any lotions the morning of surgery.  Please wear clean clothes to the hospital/surgery center.     Please read over the following fact sheets that you were given: Pain Booklet, Coughing and Deep Breathing and Surgical Site Infection Prevention

## 2014-04-22 ENCOUNTER — Encounter (HOSPITAL_COMMUNITY)
Admission: RE | Admit: 2014-04-22 | Discharge: 2014-04-22 | Disposition: A | Discharge status: Home / Self Care | Payer: Medicaid Other | Source: Ambulatory Visit | Attending: Emergency Medicine | Admitting: Emergency Medicine

## 2014-04-22 ENCOUNTER — Encounter (HOSPITAL_COMMUNITY): Payer: Self-pay

## 2014-04-22 DIAGNOSIS — E785 Hyperlipidemia, unspecified: Secondary | ICD-10-CM | POA: Diagnosis not present

## 2014-04-22 DIAGNOSIS — M1990 Unspecified osteoarthritis, unspecified site: Secondary | ICD-10-CM | POA: Diagnosis not present

## 2014-04-22 DIAGNOSIS — R911 Solitary pulmonary nodule: Secondary | ICD-10-CM | POA: Diagnosis present

## 2014-04-22 DIAGNOSIS — R591 Generalized enlarged lymph nodes: Secondary | ICD-10-CM | POA: Diagnosis not present

## 2014-04-22 DIAGNOSIS — J42 Unspecified chronic bronchitis: Secondary | ICD-10-CM | POA: Diagnosis not present

## 2014-04-22 DIAGNOSIS — F17210 Nicotine dependence, cigarettes, uncomplicated: Secondary | ICD-10-CM | POA: Diagnosis not present

## 2014-04-22 DIAGNOSIS — J449 Chronic obstructive pulmonary disease, unspecified: Secondary | ICD-10-CM | POA: Diagnosis not present

## 2014-04-22 LAB — COMPREHENSIVE METABOLIC PANEL
ALT: 13 U/L (ref 0–53)
AST: 16 U/L (ref 0–37)
Albumin: 4 g/dL (ref 3.5–5.2)
Alkaline Phosphatase: 81 U/L (ref 39–117)
Anion gap: 14 (ref 5–15)
BUN: 8 mg/dL (ref 6–23)
CO2: 23 meq/L (ref 19–32)
Calcium: 9.5 mg/dL (ref 8.4–10.5)
Chloride: 102 mEq/L (ref 96–112)
Creatinine, Ser: 0.71 mg/dL (ref 0.50–1.35)
GFR calc non Af Amer: >90 mL/min (ref >90)
Glucose, Bld: 89 mg/dL (ref 70–99)
POTASSIUM: 4.2 meq/L (ref 3.7–5.3)
SODIUM: 139 meq/L (ref 137–147)
Total Bilirubin: 0.2 mg/dL — ABNORMAL LOW (ref 0.3–1.2)
Total Protein: 8 g/dL (ref 6.0–8.3)

## 2014-04-22 LAB — PROTIME-INR
INR: 0.93 (ref 0.00–1.49)
Prothrombin Time: 12.6 seconds (ref 11.6–15.2)

## 2014-04-22 LAB — CBC
HCT: 39 % (ref 39.0–52.0)
Hemoglobin: 13.5 g/dL (ref 13.0–17.0)
MCH: 26.4 pg (ref 26.0–34.0)
MCHC: 34.6 g/dL (ref 30.0–36.0)
MCV: 76.3 fL — ABNORMAL LOW (ref 78.0–100.0)
PLATELETS: 194 10*3/uL (ref 150–400)
RBC: 5.11 MIL/uL (ref 4.22–5.81)
RDW: 14.3 % (ref 11.5–15.5)
WBC: 9.3 10*3/uL (ref 4.0–10.5)

## 2014-04-22 NOTE — Progress Notes (Signed)
req'd notes, ekg any heart tests from [facility]

## 2014-04-22 NOTE — Progress Notes (Signed)
Anesthesia Chart Review:  Pt is 63 year old male scheduled for video bronchoscopy with endobronchial navigation on 04/24/2014 with Dr. Gwenette Greet.   PMH: COPD, hyperlipidemia. Current smoker.   Preoperative labs reviewed.    EKG: NSR. Anteroseptal infarct, age undetermined. No prior EKG available for comparison.   Pt underwent bronchoscopy with endobronchial ultrasound on 82/12/32 without complications.   Pt reported to RN in PAT remote hx of some kind of "heart test" in the past. Doesn't remember what kind of test or where he had it.   If no changes, I anticipate pt can proceed with surgery as scheduled.   Willeen Cass, FNP-BC [facility] Stay [facility]/Anesthesiology Phone: [phone] 04/22/2014 3:31 PM

## 2014-04-24 ENCOUNTER — Ambulatory Visit (HOSPITAL_COMMUNITY): Payer: Medicaid Other | Admitting: Emergency Medicine

## 2014-04-24 ENCOUNTER — Encounter (HOSPITAL_COMMUNITY): Payer: Self-pay | Admitting: *Deleted

## 2014-04-24 ENCOUNTER — Encounter (HOSPITAL_COMMUNITY)
Admission: RE | Disposition: A | Discharge status: Home / Self Care | Payer: Self-pay | Source: Ambulatory Visit | Attending: Emergency Medicine

## 2014-04-24 ENCOUNTER — Ambulatory Visit (HOSPITAL_COMMUNITY)
Admission: RE | Admit: 2014-04-24 | Discharge: 2014-04-24 | Disposition: A | Discharge status: Home / Self Care | Payer: Medicaid Other | Source: Ambulatory Visit | Attending: Emergency Medicine | Admitting: Emergency Medicine

## 2014-04-24 ENCOUNTER — Ambulatory Visit (HOSPITAL_COMMUNITY): Payer: Medicaid Other

## 2014-04-24 ENCOUNTER — Ambulatory Visit (HOSPITAL_COMMUNITY): Payer: Medicaid Other | Admitting: Critical Care Medicine

## 2014-04-24 DIAGNOSIS — R591 Generalized enlarged lymph nodes: Secondary | ICD-10-CM | POA: Diagnosis not present

## 2014-04-24 DIAGNOSIS — M1990 Unspecified osteoarthritis, unspecified site: Secondary | ICD-10-CM | POA: Insufficient documentation

## 2014-04-24 DIAGNOSIS — J449 Chronic obstructive pulmonary disease, unspecified: Secondary | ICD-10-CM | POA: Diagnosis not present

## 2014-04-24 DIAGNOSIS — Z419 Encounter for procedure for purposes other than remedying health state, unspecified: Secondary | ICD-10-CM

## 2014-04-24 DIAGNOSIS — J42 Unspecified chronic bronchitis: Secondary | ICD-10-CM | POA: Diagnosis not present

## 2014-04-24 DIAGNOSIS — R599 Enlarged lymph nodes, unspecified: Secondary | ICD-10-CM

## 2014-04-24 DIAGNOSIS — E785 Hyperlipidemia, unspecified: Secondary | ICD-10-CM | POA: Insufficient documentation

## 2014-04-24 DIAGNOSIS — R918 Other nonspecific abnormal finding of lung field: Secondary | ICD-10-CM

## 2014-04-24 DIAGNOSIS — R590 Localized enlarged lymph nodes: Secondary | ICD-10-CM

## 2014-04-24 DIAGNOSIS — Z720 Tobacco use: Secondary | ICD-10-CM

## 2014-04-24 DIAGNOSIS — R911 Solitary pulmonary nodule: Secondary | ICD-10-CM | POA: Diagnosis not present

## 2014-04-24 DIAGNOSIS — F17210 Nicotine dependence, cigarettes, uncomplicated: Secondary | ICD-10-CM | POA: Insufficient documentation

## 2014-04-24 HISTORY — PX: VIDEO BRONCHOSCOPY WITH ENDOBRONCHIAL ULTRASOUND: SHX6177

## 2014-04-24 HISTORY — PX: VIDEO BRONCHOSCOPY WITH ENDOBRONCHIAL NAVIGATION: SHX6175

## 2014-04-24 SURGERY — VIDEO BRONCHOSCOPY WITH ENDOBRONCHIAL NAVIGATION
Anesthesia: General | Laterality: Right

## 2014-04-24 MED ORDER — PROPOFOL 10 MG/ML IV BOLUS
INTRAVENOUS | Status: AC
  Filled 2014-04-24: qty 20

## 2014-04-24 MED ORDER — LACTATED RINGERS IV SOLN
INTRAVENOUS | Status: DC
  Administered 2014-04-24 (×2): via INTRAVENOUS

## 2014-04-24 MED ORDER — PHENYLEPHRINE HCL 10 MG/ML IJ SOLN
INTRAMUSCULAR | Status: DC | PRN
  Administered 2014-04-24 (×3): 80 ug via INTRAVENOUS
  Administered 2014-04-24: 40 ug via INTRAVENOUS
  Administered 2014-04-24: 80 ug via INTRAVENOUS

## 2014-04-24 MED ORDER — OXYCODONE HCL 5 MG/5ML PO SOLN: 5.0000 mg | Freq: Once | ORAL | Status: DC | PRN

## 2014-04-24 MED ORDER — PHENYLEPHRINE HCL 10 MG/ML IJ SOLN
10.0000 mg | INTRAVENOUS | Status: DC | PRN
  Administered 2014-04-24: 15 ug/min via INTRAVENOUS

## 2014-04-24 MED ORDER — ONDANSETRON HCL 4 MG/2ML IJ SOLN
INTRAMUSCULAR | Status: AC
  Filled 2014-04-24: qty 2

## 2014-04-24 MED ORDER — NEOSTIGMINE METHYLSULFATE 10 MG/10ML IV SOLN
INTRAVENOUS | Status: AC
  Filled 2014-04-24: qty 1

## 2014-04-24 MED ORDER — MIDAZOLAM HCL 5 MG/5ML IJ SOLN
INTRAMUSCULAR | Status: DC | PRN
  Administered 2014-04-24 (×2): 1 mg via INTRAVENOUS

## 2014-04-24 MED ORDER — FENTANYL CITRATE 0.05 MG/ML IJ SOLN
INTRAMUSCULAR | Status: AC
  Filled 2014-04-24: qty 5

## 2014-04-24 MED ORDER — LIDOCAINE HCL (CARDIAC) 20 MG/ML IV SOLN
INTRAVENOUS | Status: AC
  Filled 2014-04-24: qty 5

## 2014-04-24 MED ORDER — ROCURONIUM BROMIDE 100 MG/10ML IV SOLN
INTRAVENOUS | Status: DC | PRN
  Administered 2014-04-24: 50 mg via INTRAVENOUS

## 2014-04-24 MED ORDER — ROCURONIUM BROMIDE 50 MG/5ML IV SOLN
INTRAVENOUS | Status: AC
  Filled 2014-04-24: qty 1

## 2014-04-24 MED ORDER — LIDOCAINE HCL (CARDIAC) 20 MG/ML IV SOLN
INTRAVENOUS | Status: DC | PRN
  Administered 2014-04-24: 80 mg via INTRAVENOUS

## 2014-04-24 MED ORDER — SODIUM CHLORIDE 0.9 % IJ SOLN
INTRAMUSCULAR | Status: AC
  Filled 2014-04-24: qty 10

## 2014-04-24 MED ORDER — PROMETHAZINE HCL 25 MG/ML IJ SOLN: 6.2500 mg | INTRAMUSCULAR | Status: DC | PRN

## 2014-04-24 MED ORDER — EPHEDRINE SULFATE 50 MG/ML IJ SOLN
INTRAMUSCULAR | Status: AC
  Filled 2014-04-24: qty 1

## 2014-04-24 MED ORDER — 0.9 % SODIUM CHLORIDE (POUR BTL) OPTIME
TOPICAL | Status: DC | PRN
  Administered 2014-04-24: 1000 mL

## 2014-04-24 MED ORDER — MIDAZOLAM HCL 2 MG/2ML IJ SOLN
INTRAMUSCULAR | Status: AC
  Filled 2014-04-24: qty 2

## 2014-04-24 MED ORDER — GLYCOPYRROLATE 0.2 MG/ML IJ SOLN
INTRAMUSCULAR | Status: AC
  Filled 2014-04-24: qty 3

## 2014-04-24 MED ORDER — FENTANYL CITRATE 0.05 MG/ML IJ SOLN
INTRAMUSCULAR | Status: DC | PRN
  Administered 2014-04-24 (×3): 50 ug via INTRAVENOUS

## 2014-04-24 MED ORDER — OXYCODONE HCL 5 MG PO TABS: 5.0000 mg | ORAL_TABLET | Freq: Once | ORAL | Status: DC | PRN

## 2014-04-24 MED ORDER — PROPOFOL 10 MG/ML IV BOLUS
INTRAVENOUS | Status: DC | PRN
  Administered 2014-04-24: 150 mg via INTRAVENOUS
  Administered 2014-04-24: 50 mg via INTRAVENOUS

## 2014-04-24 MED ORDER — PHENYLEPHRINE 40 MCG/ML (10ML) SYRINGE FOR IV PUSH (FOR BLOOD PRESSURE SUPPORT)
PREFILLED_SYRINGE | INTRAVENOUS | Status: AC
  Filled 2014-04-24: qty 10

## 2014-04-24 MED ORDER — GLYCOPYRROLATE 0.2 MG/ML IJ SOLN
INTRAMUSCULAR | Status: DC | PRN
  Administered 2014-04-24: 0.6 mg via INTRAVENOUS

## 2014-04-24 MED ORDER — ONDANSETRON HCL 4 MG/2ML IJ SOLN
INTRAMUSCULAR | Status: DC | PRN
  Administered 2014-04-24: 4 mg via INTRAVENOUS

## 2014-04-24 MED ORDER — HYDROMORPHONE HCL 1 MG/ML IJ SOLN: 0.2500 mg | INTRAMUSCULAR | Status: DC | PRN

## 2014-04-24 MED ORDER — NEOSTIGMINE METHYLSULFATE 10 MG/10ML IV SOLN
INTRAVENOUS | Status: DC | PRN
  Administered 2014-04-24: 4 mg via INTRAVENOUS

## 2014-04-24 SURGICAL SUPPLY — 44 items
BLADE 10 SAFETY STRL DISP (BLADE) ×1 IMPLANT
BRUSH CYTOL CELLEBRITY 1.5X140 (MISCELLANEOUS) ×3 IMPLANT
BRUSH SUPERTRAX BIOPSY (INSTRUMENTS) IMPLANT
BRUSH SUPERTRAX NDL-TIP CYTO (INSTRUMENTS) ×2 IMPLANT
CANISTER SUCTION 2500CC (MISCELLANEOUS) ×3 IMPLANT
CHANNEL WORK EXTEND EDGE 180 (KITS) IMPLANT
CHANNEL WORK EXTEND EDGE 45 (KITS) IMPLANT
CHANNEL WORK EXTEND EDGE 90 (KITS) IMPLANT
CONT SPEC 4OZ CLIKSEAL STRL BL (MISCELLANEOUS) ×5 IMPLANT
COVER TABLE BACK 60X90 (DRAPES) ×3 IMPLANT
FILTER STRAW FLUID ASPIR (MISCELLANEOUS) IMPLANT
FORCEPS BIOP RJ4 1.8 (CUTTING FORCEPS) ×2 IMPLANT
FORCEPS BIOP SUPERTRX PREMAR (INSTRUMENTS) IMPLANT
GAUZE SPONGE 4X4 12PLY STRL (GAUZE/BANDAGES/DRESSINGS) ×3 IMPLANT
GLOVE BIO SURGEON STRL SZ7.5 (GLOVE) ×6 IMPLANT
GLOVE BIOGEL M 6.5 STRL (GLOVE) ×2 IMPLANT
GLOVE BIOGEL M STRL SZ7.5 (GLOVE) ×3 IMPLANT
GOWN STRL REUS W/ TWL LRG LVL3 (GOWN DISPOSABLE) ×1 IMPLANT
GOWN STRL REUS W/TWL LRG LVL3 (GOWN DISPOSABLE) ×3
KIT CLEAN ENDO COMPLIANCE (KITS) ×3 IMPLANT
KIT LOCATABLE GUIDE (CANNULA) IMPLANT
KIT MARKER FIDUCIAL DELIVERY (KITS) IMPLANT
KIT PROCEDURE EDGE 180 (KITS) IMPLANT
KIT PROCEDURE EDGE 45 (KITS) ×2 IMPLANT
KIT PROCEDURE EDGE 90 (KITS) IMPLANT
KIT ROOM TURNOVER OR (KITS) ×3 IMPLANT
MARKER SKIN DUAL TIP RULER LAB (MISCELLANEOUS) ×3 IMPLANT
NDL BIOPSY TRANSBRONCH 21G (NEEDLE) IMPLANT
NDL SUPERTRX PREMARK BIOPSY (NEEDLE) IMPLANT
NEEDLE BIOPSY TRANSBRONCH 21G (NEEDLE) ×6 IMPLANT
NEEDLE SUPERTRX PREMARK BIOPSY (NEEDLE) ×3 IMPLANT
NEEDLE SYS SONOTIP II EBUSTBNA (NEEDLE) ×4 IMPLANT
NS IRRIG 1000ML POUR BTL (IV SOLUTION) ×3 IMPLANT
OIL SILICONE PENTAX (PARTS (SERVICE/REPAIRS)) ×3 IMPLANT
PAD ARMBOARD 7.5X6 YLW CONV (MISCELLANEOUS) ×6 IMPLANT
PATCHES PATIENT (LABEL) ×7 IMPLANT
SYR 20CC LL (SYRINGE) ×3 IMPLANT
SYR 20ML ECCENTRIC (SYRINGE) ×6 IMPLANT
SYR 50ML SLIP (SYRINGE) ×3 IMPLANT
SYR 5ML LUER SLIP (SYRINGE) ×3 IMPLANT
TOWEL OR 17X24 6PK STRL BLUE (TOWEL DISPOSABLE) ×3 IMPLANT
TRAP SPECIMEN MUCOUS 40CC (MISCELLANEOUS) IMPLANT
TUBE CONNECTING 20'X1/4 (TUBING) ×2
TUBE CONNECTING 20X1/4 (TUBING) ×4 IMPLANT

## 2014-04-24 NOTE — Op Note (Signed)
Video Bronchoscopy with Endobronchial Ultrasound and Electromagnetic Navigation Procedure Note  Date of Operation: 04/24/2014  Pre-op Diagnosis: pulmonary nodules and L hilar mass  Post-op Diagnosis: same  Surgeon: Baltazar Apo  Assistants: none  Anesthesia: General endotracheal anesthesia  Operation: Flexible video fiberoptic bronchoscopy with electromagnetic navigation and biopsies.  Estimated Blood Loss: Minimal  Complications: none apparent  Indications and History: AADYN BUCHHEIT is a 63 y.o. male smoker with pulmonary nodules and a L hilar mass on CT scan. The pulm nodules have decreased in size on subsequent PET, but the L hilar mass remained and was hypermetabolic. Attempts at needle biopsy using standard EBUS were unsuccessful due to the difficult anatomical location of the mass. Recommendation was made to repeat the bronchoscopy and to take advantage of electromagnetic navigation to assist with localization.  The risks, benefits, complications, treatment options and expected outcomes were discussed with the patient.  The possibilities of pneumothorax, pneumonia, reaction to medication, pulmonary aspiration, perforation of a viscus, bleeding, failure to diagnose a condition and creating a complication requiring transfusion or operation were discussed with the patient who freely signed the consent.    Description of Procedure: The patient was seen in the Preoperative Area, was examined and was deemed appropriate to proceed.  The patient was taken to OR10, identified as Lauralee Evener and the procedure verified as Flexible Video Fiberoptic Bronchoscopy.  A Time Out was held and the above information confirmed.   Prior to the date of the procedure a high-resolution CT scan of the chest was performed. Utilizing [place] a virtual tracheobronchial tree was generated to allow specific targeting of the L hilar mass. General anesthesia was initiated and the patient  was  orally intubated. The video fiberoptic bronchoscope was introduced via the endotracheal tube and a general inspection was performed which showed normal airways with the exception of a hypopigmented raised area on a LLL segmental carina. This area was biopsied. The extendable working channel and locator guide then were introduced into the bronchoscope. The distinct navigation target was identified and localized in the very proximal LUL airway.  The EBUS was then introduced into the same area with the baloon in place to confirm the L hilar mass location. Both blind Wang needle biopsies and EBUS-guided biopsies (with some difficulty due to location) were performed. Utilizing the balloon to assist with ultrasound I was able to definitely get into the lesion in question for several passes. There was a small amount of blood loss with the needle biopsies.  At the end of the procedure a general airway inspection was performed and there was no evidence of active bleeding. The bronchoscope was removed.  The patient tolerated the procedure well.   Samples: 1. Wang needle biopsies from L hilar mass 2. Endobronchial biopsies from LLL segmental carina  Plans:  The patient will be discharged from the PACU to home when recovered from anesthesia. We will review the cytology, pathology results with the patient when they become available. Outpatient followup will be with Dr Gwenette Greet or Dr Lamonte Sakai.    Baltazar Apo, MD, PhD 04/24/2014, 12:53 PM  Pulmonary and Critical Care [phone] or if no answer [phone]

## 2014-04-24 NOTE — Discharge Instructions (Signed)
Flexible Bronchoscopy, Care After These instructions give you information on caring for yourself after your procedure. Your doctor may also give you more specific instructions. Call your doctor if you have any problems or questions after your procedure. HOME CARE  Do not eat or drink anything for 2 hours after your procedure. If you try to eat or drink before the medicine wears off, food or drink could go into your lungs. You could also burn yourself.  After 2 hours have passed and when you can cough and gag normally, you may eat soft food and drink liquids slowly.  The day after the test, you may eat your normal diet.  You may do your normal activities.  Keep all doctor visits. GET HELP RIGHT AWAY IF:  You get more and more short of breath.  You get light-headed.  You feel like you are going to pass out (faint).  You have chest pain.  You have new problems that worry you.  You cough up more than a little blood.  You cough up more blood than before. MAKE SURE YOU:  Understand these instructions.  Will watch your condition.  Will get help right away if you are not doing well or get worse. Document Released: 03/14/2009 Document Revised: 05/22/2013 Document Reviewed: 01/19/2013 [facility] Patient Information 2015 [place], [place]. This information is not intended to replace advice given to you by your health care provider. Make sure you discuss any questions you have with your health care provider.  Please call our office for any questions or problems. [phone]  What to eat:  For your first meals, you should eat lightly; only small meals initially.  If you do not have nausea, you may eat larger meals.  Avoid spicy, greasy and heavy food.    General Anesthesia, Adult, Care After  Refer to this sheet in the next few weeks. These instructions provide you with information on caring for yourself after your procedure. Your health care provider may also give you more specific  instructions. Your treatment has been planned according to current medical practices, but problems sometimes occur. Call your health care provider if you have any problems or questions after your procedure.  WHAT TO EXPECT AFTER THE PROCEDURE  After the procedure, it is typical to experience:  Sleepiness.  Nausea and vomiting. HOME CARE INSTRUCTIONS  For the first 24 hours after general anesthesia:  Have a responsible person with you.  Do not drive a car. If you are alone, do not take public transportation.  Do not drink alcohol.  Do not take medicine that has not been prescribed by your health care provider.  Do not sign important papers or make important decisions.  You may resume a normal diet and activities as directed by your health care provider.  Change bandages (dressings) as directed.  If you have questions or problems that seem related to general anesthesia, call the hospital and ask for the anesthetist or anesthesiologist on call. SEEK MEDICAL CARE IF:  You have nausea and vomiting that continue the day after anesthesia.  You develop a rash. SEEK IMMEDIATE MEDICAL CARE IF:  You have difficulty breathing.  You have chest pain.  You have any allergic problems. Document Released: 08/23/2000 Document Revised: 01/17/2013 Document Reviewed: 11/30/2012  [facility] Patient Information 2014 [place], [place].

## 2014-04-24 NOTE — Anesthesia Procedure Notes (Signed)
Procedure Name: Intubation Date/Time: 04/24/2014 10:41 AM Performed by: Carola Frost Pre-anesthesia Checklist: Patient identified, Timeout performed, Emergency Drugs available, Suction available and Patient being monitored Patient Re-evaluated:Patient Re-evaluated prior to inductionOxygen Delivery Method: Circle system utilized Preoxygenation: Pre-oxygenation with 100% oxygen Intubation Type: IV induction Ventilation: Mask ventilation without difficulty Laryngoscope Size: Mac, 3, Miller and 2 Grade View: Grade II Tube type: Oral Tube size: 7.5 mm Number of attempts: 2 Airway Equipment and Method: Stylet Placement Confirmation: CO2 detector,  positive ETCO2,  ETT inserted through vocal cords under direct vision and breath sounds checked- equal and bilateral Secured at: 23 cm Tube secured with: Tape Dental Injury: Teeth and Oropharynx as per pre-operative assessment  Comments: Intubation by Letta Kocher

## 2014-04-24 NOTE — H&P (Signed)
HPI The patient is a 63 year old male who I've been asked to see for pulmonary nodules and lymphadenopathy. The patient has been diagnosed with COPD, and is being followed by Dr. Annamaria Boots. The patient was found to have an abnormal left hilar density on chest x-ray, and this was verified on CT chest. This scan is not available for my review, but the report shows a left hilar lymph node enlargement, as well as nodular densities in the left upper lobe. The patient has subsequently had a PET scan showing significant uptake in the left hilar lymph node, and 80mm left upper lobe nodule with mildly increased uptake, as well as at 15 mm nodule in the superior segment of the right lower lobe with borderline uptake. The patient tells me that he has an intermittent cough but has not had hemoptysis. Unfortunately he has continued to smoke. He tells me that his weight has been up and down, but he thinks he has lost about 17 pounds this year. He denies any issues with his appetite.  Hospital H&P 04/24/14 --  Patient presents today for repeat EBUS and possible ENB to biopsy L hilar LAD. He states that he has felt well, no significant changes in symptoms. No new meds. He understands the procedure, indications, risks.  His niece is here with him.   Past Medical History  Diagnosis Date  . Hyperlipidemia   . COPD (chronic obstructive pulmonary disease)   . Arthritis      Family History  Problem Relation Age of Onset  . Heart attack Mother   . Heart attack Father      History   Social History  . Marital Status: Single    Spouse Name: N/A    Number of Children: N/A  . Years of Education: N/A   Occupational History  . unemployed    Social History Main Topics  . Smoking status: Current Every Day Smoker -- 0.50 packs/day for 30 years    Types: Cigarettes  . Smokeless tobacco: Never Used     Comment: 3-4 cigs daily 04/12/14.   Marland Kitchen Alcohol Use: No  . Drug Use: No  . Sexual Activity: Not on file   Other  Topics Concern  . Not on file   Social History Narrative     No Known Allergies   Filed Vitals:   04/24/14 0803  BP: 130/80  Pulse: 63  Temp: 98.7 F (37.1 C)  TempSrc: Oral  Resp: 16  SpO2: 100%   Gen: Pleasant, well-nourished, in no distress,  normal affect  ENT: No lesions,  mouth clear,  oropharynx clear, no postnasal drip  Neck: No JVD, no TMG, no carotid bruits  Lungs: No use of accessory muscles, clear without rales or rhonchi  Cardiovascular: RRR, heart sounds normal, no murmur or gallops, no peripheral edema  Musculoskeletal: No deformities, no cyanosis or clubbing  Neuro: alert, non focal  Skin: Warm, no lesions or rashes   04/15/14 --  COMPARISON: Chest radiograph of 02/1914.  FINDINGS: Lungs/Pleura: Patent airways, including to the left upper lobe.  Moderate centrilobular emphysema.  The spiculated right lower lobe superior segment density is nearly completely resolved. Residual nodule measures 7 mm on image 32 of series 3 (compare 1.5 cm on the prior exam).  The 8 mm left upper lobe hypermetabolic nodule detailed on the prior PET is less apparent today. There is likely soft tissue density within a posterior left upper lobe bronchial, measuring 5 mm on image 22. The previously measured 8 mm  density was likely this endobronchial soft tissue nodule and the adjacent vascular pedicle. There is immediately supra adjacent bronchiectasis on image 17 of series 3.  No new nodules are identified. Left suprahilar versus central left upper lobe soft tissue mass is inseparable from the left pulmonary artery. Measures on the order of 3.1 x 2.4 cm on image 29 of series 2. Similar to on the prior exam (when remeasured). This is positioned immediately superior and anterior to the bifurcation of the left upper lobe bronchus.  No pleural fluid.  Heart/Mediastinum: Aortic and branch vessel atherosclerosis. Normal heart size, without pericardial  effusion. Multivessel coronary artery atherosclerosis. No middle mediastinal or right hilar adenopathy.  Upper Abdomen: Normal imaged portions of the liver, spleen, stomach, pancreas, adrenal glands, kidneys.  Bones/Musculoskeletal: No acute osseous abnormality.  IMPRESSION: 1. 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 This is intimately associated with the left pulmonary artery. 2. This superior segment right lower lobe opacity/nodule has nearly completely resolved and was likely infectious. 3. An area of endobronchial soft tissue density measures 5 mm within the posterior left upper lobe and is felt to be similar to on the prior exam. This causes adjacent bronchiectasis and could represent an endobronchial synchronous neoplasm or an area of mucous plugging.   Plans:  FOB + EBUS this am. If unable to reach the node with EBUS then will utilize ENB to localize.   Baltazar Apo, MD, PhD 04/24/2014, 10:39 AM [facility] [phone] or if no answer [phone]

## 2014-04-24 NOTE — Transfer of Care (Signed)
Immediate Anesthesia Transfer of Care Note  Patient: Larry [name]  Procedure(s) Performed: Procedure(s): VIDEO BRONCHOSCOPY WITH ENDOBRONCHIAL NAVIGATION (Right) VIDEO BRONCHOSCOPY WITH ENDOBRONCHIAL ULTRASOUND (Right)  Patient Location: PACU  Anesthesia Type:General  Level of Consciousness: awake and alert   Airway & Oxygen Therapy: Patient Spontanous Breathing and Patient connected to nasal cannula oxygen  Post-op Assessment: Report given to PACU RN, Post -op Vital signs reviewed and stable and Patient moving all extremities X 4  Post vital signs: Reviewed and stable  Complications: No apparent anesthesia complications

## 2014-04-24 NOTE — Anesthesia Postprocedure Evaluation (Signed)
Anesthesia Post Note  Patient: Larry [name]  Procedure(s) Performed: Procedure(s) (LRB): VIDEO BRONCHOSCOPY WITH ENDOBRONCHIAL NAVIGATION (Right) VIDEO BRONCHOSCOPY WITH ENDOBRONCHIAL ULTRASOUND (Right)  Anesthesia type: general  Patient location: PACU  Post pain: Pain level controlled  Post assessment: Patient's Cardiovascular Status Stable  Last Vitals:  Filed Vitals:   04/24/14 1345  BP: 130/66  Pulse: 62  Temp:   Resp: 14    Post vital signs: Reviewed and stable  Level of consciousness: sedated  Complications: No apparent anesthesia complications

## 2014-04-24 NOTE — Anesthesia Preprocedure Evaluation (Signed)
Anesthesia Evaluation  Patient identified by MRN, date of birth, ID band Patient awake    Reviewed: Allergy & Precautions, H&P , NPO status , Patient's Chart, lab work & pertinent test results  History of Anesthesia Complications Negative for: history of anesthetic complications  Airway Mallampati: I  TM Distance: >3 FB Neck ROM: Full    Dental  (+) Edentulous Upper, Edentulous Lower, Dental Advisory Given   Pulmonary COPDCurrent Smoker,    Pulmonary exam normal       Cardiovascular negative cardio ROS      Neuro/Psych negative neurological ROS  negative psych ROS   GI/Hepatic negative GI ROS, Neg liver ROS,   Endo/Other  negative endocrine ROS  Renal/GU negative Renal ROS     Musculoskeletal   Abdominal   Peds  Hematology   Anesthesia Other Findings   Reproductive/Obstetrics                             Anesthesia Physical Anesthesia Plan  ASA: III  Anesthesia Plan: General   Post-op Pain Management:    Induction: Intravenous  Airway Management Planned: Oral ETT  Additional Equipment:   Intra-op Plan:   Post-operative Plan: Extubation in OR  Informed Consent: I have reviewed the patients History and Physical, chart, labs and discussed the procedure including the risks, benefits and alternatives for the proposed anesthesia with the patient or authorized representative who has indicated his/her understanding and acceptance.   Dental advisory given  Plan Discussed with: CRNA, Anesthesiologist and Surgeon  Anesthesia Plan Comments:         Anesthesia Quick Evaluation

## 2014-04-29 ENCOUNTER — Encounter (HOSPITAL_COMMUNITY): Payer: Self-pay | Admitting: Emergency Medicine

## 2014-04-29 ENCOUNTER — Telehealth: Payer: Self-pay | Admitting: Internal Medicine

## 2014-04-29 NOTE — Telephone Encounter (Signed)
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 I will have his case presented for review at [facility] this week and call Larry [name] after that to discuss net steps. He indicated agreement.

## 2014-05-06 LAB — FUNGUS CULTURE W SMEAR
FUNGAL SMEAR: NONE SEEN
Special Requests: NORMAL

## 2014-05-09 ENCOUNTER — Other Ambulatory Visit: Payer: Self-pay | Admitting: Internal Medicine

## 2014-05-09 DIAGNOSIS — C3490 Malignant neoplasm of unspecified part of unspecified bronchus or lung: Secondary | ICD-10-CM

## 2014-05-13 ENCOUNTER — Other Ambulatory Visit: Payer: Self-pay | Admitting: *Deleted

## 2014-05-13 ENCOUNTER — Telehealth: Payer: Self-pay | Admitting: *Deleted

## 2014-05-13 DIAGNOSIS — R918 Other nonspecific abnormal finding of lung field: Secondary | ICD-10-CM

## 2014-05-13 NOTE — Telephone Encounter (Signed)
Called to set up appt.  Unable to reach patient or leave vm message

## 2014-05-13 NOTE — Telephone Encounter (Signed)
Called to schedule appt with Dr. Julien Nordmann.  I spoke with patient and he requested I speak with niece to schedule appt.  I did.  I spoke with Peter Congo and gave her appt time and place.  05/22/14 at 11:15.  She verbalized understanding of appt time and place.

## 2014-05-21 LAB — AFB CULTURE WITH SMEAR (NOT AT ARMC)
ACID FAST SMEAR: NONE SEEN
Special Requests: NORMAL

## 2014-05-22 ENCOUNTER — Ambulatory Visit (HOSPITAL_BASED_OUTPATIENT_CLINIC_OR_DEPARTMENT_OTHER): Payer: Medicaid Other | Admitting: Internal Medicine

## 2014-05-22 ENCOUNTER — Telehealth: Payer: Self-pay | Admitting: Internal Medicine

## 2014-05-22 ENCOUNTER — Telehealth: Payer: Self-pay | Admitting: *Deleted

## 2014-05-22 ENCOUNTER — Encounter: Payer: Self-pay | Admitting: Internal Medicine

## 2014-05-22 ENCOUNTER — Ambulatory Visit (HOSPITAL_BASED_OUTPATIENT_CLINIC_OR_DEPARTMENT_OTHER): Payer: Medicaid Other | Admitting: Lab

## 2014-05-22 VITALS — BP 130/69 | HR 70 | Temp 98.6°F | Resp 18 | Ht 74.0 in | Wt 203.1 lb

## 2014-05-22 DIAGNOSIS — C3402 Malignant neoplasm of left main bronchus: Secondary | ICD-10-CM

## 2014-05-22 DIAGNOSIS — C3492 Malignant neoplasm of unspecified part of left bronchus or lung: Secondary | ICD-10-CM

## 2014-05-22 DIAGNOSIS — Z720 Tobacco use: Secondary | ICD-10-CM

## 2014-05-22 DIAGNOSIS — R918 Other nonspecific abnormal finding of lung field: Secondary | ICD-10-CM

## 2014-05-22 HISTORY — DX: Malignant neoplasm of unspecified part of left bronchus or lung: C34.92

## 2014-05-22 LAB — CBC WITH DIFFERENTIAL/PLATELET
BASO%: 0.3 % (ref 0.0–2.0)
BASOS ABS: 0 10*3/uL (ref 0.0–0.1)
EOS%: 4 % (ref 0.0–7.0)
Eosinophils Absolute: 0.4 10*3/uL (ref 0.0–0.5)
HEMATOCRIT: 38.5 % (ref 38.4–49.9)
HEMOGLOBIN: 13.5 g/dL (ref 13.0–17.1)
LYMPH#: 2.9 10*3/uL (ref 0.9–3.3)
LYMPH%: 31.4 % (ref 14.0–49.0)
MCH: 27.1 pg — AB (ref 27.2–33.4)
MCHC: 35.1 g/dL (ref 32.0–36.0)
MCV: 77.3 fL — ABNORMAL LOW (ref 79.3–98.0)
MONO#: 0.8 10*3/uL (ref 0.1–0.9)
MONO%: 9.1 % (ref 0.0–14.0)
NEUT#: 5.1 10*3/uL (ref 1.5–6.5)
NEUT%: 55.2 % (ref 39.0–75.0)
Platelets: 184 10*3/uL (ref 140–400)
RBC: 4.98 10*6/uL (ref 4.20–5.82)
RDW: 14.8 % — ABNORMAL HIGH (ref 11.0–14.6)
WBC: 9.3 10*3/uL (ref 4.0–10.3)

## 2014-05-22 LAB — COMPREHENSIVE METABOLIC PANEL (CC13)
ALBUMIN: 4 g/dL (ref 3.5–5.0)
ALT: 12 U/L (ref 0–55)
ANION GAP: 9 meq/L (ref 3–11)
AST: 14 U/L (ref 5–34)
Alkaline Phosphatase: 81 U/L (ref 40–150)
BUN: 7 mg/dL (ref 7.0–26.0)
CALCIUM: 9.7 mg/dL (ref 8.4–10.4)
CO2: 24 mEq/L (ref 22–29)
Chloride: 106 mEq/L (ref 98–109)
Creatinine: 0.8 mg/dL (ref 0.7–1.3)
EGFR: >90 mL/min/{1.73_m2} (ref >90)
Glucose: 94 mg/dl (ref 70–140)
POTASSIUM: 4 meq/L (ref 3.5–5.1)
Sodium: 140 mEq/L (ref 136–145)
TOTAL PROTEIN: 7.5 g/dL (ref 6.4–8.3)
Total Bilirubin: 0.34 mg/dL (ref 0.20–1.20)

## 2014-05-22 NOTE — Progress Notes (Signed)
[place] [phone]   [phone]  CONSULT NOTE  REFERRING PHYSICIAN: Dr. Danton Sewer  REASON FOR CONSULTATION:  63 years old African-American male recently diagnosed with lung cancer.  HPI Larry [name] is a 63 y.o. male with past medical history significant for COPD, dyslipidemia, arthritis and long history of smoking. The patient mentions that he has been complaining of shortness of breath and cough for years. He was seen by his pulmonologist and treated with inhaler. His symptoms were not improving and the patient was seen by Dr. Braulio Conte and chest x-ray was performed on 12/07/2013 and it showed left hilar prominence. This was followed by CT scan of the chest on 01/03/2014 and it showed clusters of 3 mm nodules in the anterior left upper lobe in addition to enlarge and left hilar lymph node measuring 2.0 x 2.2 cm. A PET scan was performed on 02/05/2014 and it showed enlarged hypermetabolic nodule versus lymph node in the left suprahilar location concerning for metastatic adenopathy. There was a small hypermetabolic nodule or focus in the left upper lobe that could represent a primary bronchogenic carcinoma. There was also hypermetabolic nodular focus in the superior segment of the right lower lobe concerning for a second primary neoplasm versus a focus of infection. On 04/08/2014 the patient underwent flexible fiberoptic bronchoscopy with biopsy and endobronchial ultrasound under the care of Dr. Gwenette Greet. The final cytology was not conclusive for malignancy and showed reactive bronchial epithelial cells. On 04/24/2014 the patient underwent a video bronchoscopy with endobronchial ultrasound and electromagnetic navigational procedure under the care of Dr. Lamonte Sakai. The final cytology of the fine-needle aspiration of the left hilar mass (Accession: [id number]) was positive for squamous cell carcinoma. Repeat CT scan of the chest on 04/15/2014 showed the left  suprahilar soft tissue mass favored to represent a central primary bronchogenic carcinoma. The superior segment right lower lobe opacity/nodule had nearly completely resolved and was likely infectious. There was an area of endobronchial soft tissue density measured 5 mm within the posterior left upper lobe and is felt to be similar to the one on the prior exam. This causes adjacent bronchiectasis and could represent an endobronchial synchronous neoplasm or an area of mucous plugging. Dr. Braulio Conte kindly referred the patient to me today for evaluation and recommendation regarding treatment of his condition. When seen today the patient is feeling fine with no specific complaints except for cough productive of whitish sputum. He denied having any significant chest pain or shortness of breath. He has no hemoptysis. He denied having any significant weight loss or night sweats. He has no nausea or vomiting. He has no visual changes or headache. Family history significant for mother with diabetes mellitus and heart disease. Father has heart disease. Social history the patient is single. He was accompanied by his niece Larry [name]. He is currently on disability. He has a history of smoking 2 pack per day for around 50 years and unfortunately he continues to smoke. He has no history of alcohol or drug abuse. HPI  Past Medical History  Diagnosis Date  . Hyperlipidemia   . COPD (chronic obstructive pulmonary disease)   . Arthritis     Past Surgical History  Procedure Laterality Date  . Colonoscopy    . Endobronchial ultrasound Bilateral 04/08/2014    Procedure: ENDOBRONCHIAL ULTRASOUND;  Surgeon: Kathee Delton, MD;  Location: [facility];  Service: Cardiopulmonary;  Laterality: Bilateral;  . Video bronchoscopy with endobronchial navigation Right 04/24/2014    Procedure:  VIDEO BRONCHOSCOPY WITH ENDOBRONCHIAL NAVIGATION;  Surgeon: Collene Gobble, MD;  Location: [facility];  Service: Thoracic;  Laterality: Right;  .  Video bronchoscopy with endobronchial ultrasound Right 04/24/2014    Procedure: VIDEO BRONCHOSCOPY WITH ENDOBRONCHIAL ULTRASOUND;  Surgeon: Collene Gobble, MD;  Location: [facility];  Service: Thoracic;  Laterality: Right;    Family History  Problem Relation Age of Onset  . Heart attack Mother   . Heart attack Father     Social History History  Substance Use Topics  . Smoking status: Current Every Day Smoker -- 0.50 packs/day for 30 years    Types: Cigarettes  . Smokeless tobacco: Never Used     Comment: 3-4 cigs daily 04/12/14.   Marland Kitchen Alcohol Use: No    No Known Allergies  Current Outpatient Prescriptions  Medication Sig Dispense Refill  . atorvastatin (LIPITOR) 80 MG tablet Take 80 mg by mouth daily.    . budesonide-formoterol (SYMBICORT) 160-4.5 MCG/ACT inhaler Inhale 2 puffs into the lungs 2 (two) times daily. 1 Inhaler 11  . Omega-3 Fatty Acids (FISH OIL PO) Take 1 tablet by mouth 2 (two) times daily.      No current facility-administered medications for this visit.    Review of Systems  Constitutional: positive for fatigue Eyes: negative Ears, nose, mouth, throat, and face: negative Respiratory: positive for cough and sputum Cardiovascular: negative Gastrointestinal: negative Genitourinary:negative Integument/breast: negative Hematologic/lymphatic: negative Musculoskeletal:negative Neurological: negative Behavioral/Psych: negative Endocrine: negative Allergic/Immunologic: negative  Physical Exam  OEV:OJJKK, healthy, no distress, well nourished and well developed SKIN: skin color, texture, turgor are normal, no rashes or significant lesions HEAD: Normocephalic, No masses, lesions, tenderness or abnormalities EYES: normal, PERRLA EARS: External ears normal, Canals clear OROPHARYNX:no exudate, no erythema and lips, buccal mucosa, and tongue normal  NECK: supple, no adenopathy, no JVD LYMPH:  no palpable lymphadenopathy, no hepatosplenomegaly LUNGS: clear to  auscultation , and palpation HEART: regular rate & rhythm, no murmurs and no gallops ABDOMEN:abdomen soft, non-tender, normal bowel sounds and no masses or organomegaly BACK: Back symmetric, no curvature., No CVA tenderness EXTREMITIES:no joint deformities, effusion, or inflammation, no edema, no skin discoloration, no clubbing  NEURO: alert & oriented x 3 with fluent speech, no focal motor/sensory deficits  PERFORMANCE STATUS: ECOG 1  LABORATORY DATA: Lab Results  Component Value Date   WBC 9.3 05/22/2014   HGB 13.5 05/22/2014   HCT 38.5 05/22/2014   MCV 77.3* 05/22/2014   PLT 184 05/22/2014      Chemistry      Component Value Date/Time   NA 140 05/22/2014 1055   NA 139 04/22/2014 1132   K 4.0 05/22/2014 1055   K 4.2 04/22/2014 1132   CL 102 04/22/2014 1132   CO2 24 05/22/2014 1055   CO2 23 04/22/2014 1132   BUN 7.0 05/22/2014 1055   BUN 8 04/22/2014 1132   CREATININE 0.8 05/22/2014 1055   CREATININE 0.71 04/22/2014 1132      Component Value Date/Time   CALCIUM 9.7 05/22/2014 1055   CALCIUM 9.5 04/22/2014 1132   ALKPHOS 81 05/22/2014 1055   ALKPHOS 81 04/22/2014 1132   AST 14 05/22/2014 1055   AST 16 04/22/2014 1132   ALT 12 05/22/2014 1055   ALT 13 04/22/2014 1132   BILITOT 0.34 05/22/2014 1055   BILITOT 0.2* 04/22/2014 1132       RADIOGRAPHIC STUDIES: No results found.  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 This was initially thought to be stage IV with the presence of the right lower lobe lung nodule which completely resolved on restaging scan and was suspicious for inflammatory process.   PLAN: I had a lengthy discussion with the patient and his niece today about his current disease is stage, prognosis and treatment options. I will complete the staging workup by ordering a  MRI of the brain to rule out brain metastasis. I discussed with the patient has treatment options including treatment with neoadjuvant systemic chemotherapy with carboplatin and paclitaxel. I will also consider the patient for evaluation at the multidisciplinary thoracic oncology clinic to see if he is a surgical candidate at this point or if you could benefit from a course of concurrent chemoradiation if he is not a surgical candidate. He is expected to start the first cycle of his chemotherapy on 06/05/2014. I will arrange for the patient to have a chemotherapy education class before starting the first dose of his chemotherapy. I will call his pharmacy was prescription for Compazine 10 mg by mouth every 6 hours as needed for nausea. For smoke cessation, I strongly encouraged the patient to quit smoking and offered him a smoking cessation program. The patient was also seen by the thoracic navigator for smoke cessation counseling today. The patient and his niece that time to ask questions and I answered them completely to their satisfaction. He was advised to call immediately if he has any concerning symptoms in the interval. The patient voices understanding of current disease status and treatment options and is in agreement with the current care plan.  All questions were answered. The patient knows to call the clinic with any problems, questions or concerns. We can certainly see the patient much sooner if necessary.  Thank you so much for allowing me to participate in the care of Larry [name]. I will continue to follow up the patient with you and assist in his care.  I spent 55 minutes counseling the patient face to face. The total time spent in the appointment was 80 minutes.  Disclaimer: This note was dictated with voice recognition software. Similar sounding words can inadvertently be transcribed and may not be corrected upon review.   Delany Steury K. 05/22/2014, 11:56 AM

## 2014-05-22 NOTE — Telephone Encounter (Signed)
Per staff message and POF I have scheduled appts. Advised scheduler of appts. JMW  

## 2014-05-22 NOTE — Telephone Encounter (Signed)
Gave avs & cal for Jan. Sent mess to sch tx.

## 2014-05-27 ENCOUNTER — Telehealth: Payer: Self-pay | Admitting: *Deleted

## 2014-05-27 ENCOUNTER — Other Ambulatory Visit: Payer: Self-pay | Admitting: *Deleted

## 2014-05-27 ENCOUNTER — Encounter: Payer: Self-pay | Admitting: *Deleted

## 2014-05-27 NOTE — Telephone Encounter (Signed)
Per staff message and POF I have scheduled appts. Advised scheduler of appts. JMW  

## 2014-05-27 NOTE — Progress Notes (Signed)
Patient called me back.  He requested I call Peter Congo with appt needs.  I called her and gave her appt's and update on schedule.  She asked that I call her with appt.  I put a note in chart to call her with all appt.

## 2014-05-27 NOTE — Telephone Encounter (Signed)
Spoke with Dr. Julien Nordmann to follow up on appt for thoracic clinic on 06/06/14.  He stated to hold his first chemo until after thoracic clinic.  I called patient to notify him.  He verbalized understanding of appt time and place.

## 2014-05-28 ENCOUNTER — Other Ambulatory Visit: Payer: Self-pay | Admitting: *Deleted

## 2014-05-28 ENCOUNTER — Other Ambulatory Visit: Payer: Medicaid Other

## 2014-05-28 ENCOUNTER — Encounter: Payer: Self-pay | Admitting: *Deleted

## 2014-05-28 DIAGNOSIS — C3490 Malignant neoplasm of unspecified part of unspecified bronchus or lung: Secondary | ICD-10-CM

## 2014-05-28 MED ORDER — PROCHLORPERAZINE MALEATE 10 MG PO TABS: 10.0000 mg | ORAL_TABLET | Freq: Four times a day (QID) | ORAL | Status: DC | PRN

## 2014-06-03 ENCOUNTER — Telehealth: Payer: Self-pay | Admitting: Internal Medicine

## 2014-06-03 NOTE — Telephone Encounter (Signed)
per Norton Blizzard add pt for 1.7 @ 3pm and pt aware.

## 2014-06-05 ENCOUNTER — Ambulatory Visit: Payer: Medicaid Other

## 2014-06-05 ENCOUNTER — Other Ambulatory Visit: Payer: Medicaid Other

## 2014-06-06 ENCOUNTER — Encounter: Payer: Self-pay | Admitting: *Deleted

## 2014-06-06 ENCOUNTER — Encounter: Payer: Self-pay | Admitting: Thoracic Surgery (Cardiothoracic Vascular Surgery)

## 2014-06-06 ENCOUNTER — Telehealth: Payer: Self-pay | Admitting: Internal Medicine

## 2014-06-06 ENCOUNTER — Ambulatory Visit (HOSPITAL_BASED_OUTPATIENT_CLINIC_OR_DEPARTMENT_OTHER): Payer: Medicaid Other | Admitting: Internal Medicine

## 2014-06-06 ENCOUNTER — Ambulatory Visit: Payer: Medicaid Other | Attending: Internal Medicine | Admitting: Physical Therapy

## 2014-06-06 ENCOUNTER — Ambulatory Visit (INDEPENDENT_AMBULATORY_CARE_PROVIDER_SITE_OTHER): Payer: Medicaid Other | Admitting: Thoracic Surgery (Cardiothoracic Vascular Surgery)

## 2014-06-06 ENCOUNTER — Encounter: Payer: Self-pay | Admitting: Internal Medicine

## 2014-06-06 ENCOUNTER — Ambulatory Visit: Payer: Medicaid Other | Admitting: Radiation Oncology

## 2014-06-06 ENCOUNTER — Other Ambulatory Visit: Payer: Self-pay | Admitting: *Deleted

## 2014-06-06 VITALS — BP 151/75 | HR 92 | Temp 98.8°F | Resp 19 | Ht 74.0 in | Wt 203.3 lb

## 2014-06-06 VITALS — BP 151/75 | HR 92 | Temp 98.8°F | Resp 19 | Wt 203.2 lb

## 2014-06-06 DIAGNOSIS — C3490 Malignant neoplasm of unspecified part of unspecified bronchus or lung: Secondary | ICD-10-CM

## 2014-06-06 DIAGNOSIS — C3402 Malignant neoplasm of left main bronchus: Secondary | ICD-10-CM | POA: Diagnosis not present

## 2014-06-06 DIAGNOSIS — J449 Chronic obstructive pulmonary disease, unspecified: Secondary | ICD-10-CM

## 2014-06-06 DIAGNOSIS — R0602 Shortness of breath: Secondary | ICD-10-CM | POA: Diagnosis present

## 2014-06-06 DIAGNOSIS — C3492 Malignant neoplasm of unspecified part of left bronchus or lung: Secondary | ICD-10-CM

## 2014-06-06 NOTE — Progress Notes (Signed)
[place] Clinical Social Work  Clinical Social Work met with patient/family at [employer] appointment to offer support and assess for psychosocial needs.  Patient was accompanied by niece- he reports his sister and niece are his primary supports at this time.  The patient lives alone.  CSW and patient plan to complete SCAT application and financial assistance applications at upcoming chemotherapy appointments.  Clinical Social Work briefly discussed Clinical Social Work role and [employer] support programs/services.  Clinical Social Work encouraged patient to call with any additional questions or concerns.   Polo Riley, MSW, LCSW, OSW-C Clinical Social Worker [facility] [phone]

## 2014-06-06 NOTE — Telephone Encounter (Signed)
gv adn printed appt sched and avs for pt for Jan and Feb....sed added tx. °

## 2014-06-06 NOTE — Progress Notes (Signed)
PCP is Millsaps, Luane School, NP Referring Provider is [name], NP  No chief complaint on file.   HPI: Larry [name] is a 64 yo man sent for consultation regarding a left upper lobe lung cancer.  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 He saw Dr. Gwenette Greet in July and a chest x-ray showed a prominent left hilum. A CT was done in August which showed a left hilar mass 2.0 x 2.4 cm. That led to a PET/CT which showed teh left hilar mass to be hypermetabolic and also showed a hypermetabolic lesion in the superior segment of the RLL. This was felt concerning for stage IV lung cancer.   A bronch/ EBUS was done in early November, but was nondiagnostic. 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 Of note on the new CT the right lower lobe opacity had nearly completely resolved. In light of this finding he no longer had stage 4 disease.  Larry [name] is single and lives alone. He is independent. He smoked over a ppd x 45 years, but says he is now down to 3-4 cigarettes/ day. He denies chest pain. He denies shortness of breath except with heavy exertion. He has a cough productive of white phlegm. He denies hemoptysis. He has gained 6 pounds over the past 3 months but is down 7 pounds over the past 6 months. He does not have any problems with wheezing as long as he is taking his Symbicort.  Zubrod Score: At the time of surgery this patient's most appropriate activity status/level should be described as: [x]     0    Normal activity, no symptoms []     1    Restricted in physical strenuous activity but ambulatory, able to do out light work []     2    Ambulatory and capable of self care, unable to do work activities, up and about >50 % of waking hours                              []     3    Only limited self care, in bed greater than 50% of waking hours []     4    Completely disabled, no self care, confined to bed or chair []     5    Moribund   Past  Medical History  Diagnosis Date  . Hyperlipidemia   . COPD (chronic obstructive pulmonary disease)   . Arthritis     Past Surgical History  Procedure Laterality Date  . Colonoscopy    . Endobronchial ultrasound Bilateral 04/08/2014    Procedure: ENDOBRONCHIAL ULTRASOUND;  Surgeon: Kathee Delton, MD;  Location: [facility];  Service: Cardiopulmonary;  Laterality: Bilateral;  . Video bronchoscopy with endobronchial navigation Right 04/24/2014    Procedure: VIDEO BRONCHOSCOPY WITH ENDOBRONCHIAL NAVIGATION;  Surgeon: Collene Gobble, MD;  Location: [place];  Service: Thoracic;  Laterality: Right;  . Video bronchoscopy with endobronchial ultrasound Right 04/24/2014    Procedure: VIDEO BRONCHOSCOPY WITH ENDOBRONCHIAL ULTRASOUND;  Surgeon: Collene Gobble, MD;  Location: [place];  Service: Thoracic;  Laterality: Right;    Family History  Problem Relation Age of Onset  . Heart attack Mother   . Heart attack Father   . Diabetes Mother   . Heart failure Father   . Cancer Brother     Social History History  Substance Use  Topics  . Smoking status: Current Some Day Smoker -- 1.00 packs/day for 45 years    Types: Cigarettes  . Smokeless tobacco: Never Used     Comment: 3-4 cigs daily 04/12/14.   Marland Kitchen Alcohol Use: No    Current Outpatient Prescriptions  Medication Sig Dispense Refill  . atorvastatin (LIPITOR) 80 MG tablet Take 80 mg by mouth daily.    . budesonide-formoterol (SYMBICORT) 160-4.5 MCG/ACT inhaler Inhale 2 puffs into the lungs 2 (two) times daily. 1 Inhaler 11  . Omega-3 Fatty Acids (FISH OIL PO) Take 1 tablet by mouth 2 (two) times daily.     . prochlorperazine (COMPAZINE) 10 MG tablet Take 1 tablet (10 mg total) by mouth every 6 (six) hours as needed for nausea or vomiting. (Patient not taking: Reported on 06/06/2014) 30 tablet 1   No current facility-administered medications for this visit.    No Known Allergies  Review of Systems  Constitutional: Negative for fever, chills,  appetite change and unexpected weight change.  Respiratory: Positive for cough (white phlegm, no hemoptysis) and shortness of breath (only with heavy exertion). Negative for wheezing (no wheezing on symbicort).   Cardiovascular: Negative for chest pain.  Neurological: Negative.   Hematological: Does not bruise/bleed easily.  All other systems reviewed and are negative.   BP 151/75 mmHg  Pulse 92  Temp(Src) 98.8 F (37.1 C)  Resp 19  Wt 203 lb 3 oz (92.165 kg)  SpO2 100% Physical Exam  Constitutional: He is oriented to person, place, and time. He appears well-developed and well-nourished. No distress.  HENT:  Head: Normocephalic and atraumatic.  Eyes: EOM are normal. Pupils are equal, round, and reactive to light.  Neck: Neck supple. No thyromegaly present.  Cardiovascular: Normal rate, regular rhythm, normal heart sounds and intact distal pulses.  Exam reveals no gallop and no friction rub.   No murmur heard. Pulmonary/Chest: Effort normal and breath sounds normal. He has no wheezes. He has no rales.  Abdominal: Soft. There is no tenderness.  Musculoskeletal: He exhibits no edema.  Lymphadenopathy:    He has no cervical adenopathy.  Neurological: He is alert and oriented to person, place, and time. No cranial nerve deficit.  Skin: Skin is warm and dry.  Vitals reviewed.    Diagnostic Tests: NUCLEAR MEDICINE PET SKULL BASE TO THIGH 02/05/2014  TECHNIQUE: 9.7 mCi F-18 FDG was injected intravenously. Full-ring PET imaging was performed from the skull base to thigh after the radiotracer. CT data was obtained and used for attenuation correction and anatomic localization.  FASTING BLOOD GLUCOSE: Value: 123 mg/dl  COMPARISON: None available  FINDINGS: NECK  No hypermetabolic lymph nodes in the neck.  CHEST  Within the left upper lobe, there is a small nodular focus with peripheral spiculation measuring 8 mm (image 26, series 7) and associated metabolic activity  (SUV max equals 7.3).  There is a hypermetabolic nodular lesion within the left suprahilar location just superior to the left upper lobe bronchus measuring 22 mm short axis with SUV max equal 12.4 (image 84).  Within the superior segment of the right lower lobe, just posterior to the bronchus intermedius there is a 15 mm nodule lesion with irregular margins (image 39, series 7) with SUV max equal 6.6.  There small paratracheal lymph nodes which are less than 10 mm short axis and have low metabolic activity.  ABDOMEN/PELVIS  No abnormal hypermetabolic activity within the liver, pancreas, adrenal glands, or spleen. No hypermetabolic lymph nodes in the abdomen or pelvis.  SKELETON  No focal hypermetabolic activity to suggest skeletal metastasis.  IMPRESSION: 1. Enlarged hypermetabolic nodule versus lymph node in the left suprahilar location is concerning for metastatic adenopathy. 2. Small hypermetabolic nodular focus in the left upper lobe could represent a primary bronchogenic carcinoma. 3. Hypermetabolic nodular focus in the superior segment of the right lower lobe could represent a second primary neoplasm versus focus of infection. 4. Low metabolic activity of small mediastinal favors reactive adenopathy. 5. These central lesions would likely be accessible for tissue sampling by bronchoscopy.   Electronically Signed  By: Suzy Bouchard M.D.  On: 02/05/2014 09:04  CT CHEST WITHOUT CONTRAST 04/15/2014  TECHNIQUE: Multidetector CT imaging of the chest was performed using thin slice collimation for electromagnetic bronchoscopy planning purposes, without intravenous contrast.  COMPARISON: Chest radiograph of 02/1914.  FINDINGS: Lungs/Pleura: Patent airways, including to the left upper lobe.  Moderate centrilobular emphysema.  The spiculated right lower lobe superior segment density is nearly completely resolved. Residual nodule measures 7 mm on  image 32 of series 3 (compare 1.5 cm on the prior exam).  The 8 mm left upper lobe hypermetabolic nodule detailed on the prior PET is less apparent today. There is likely soft tissue density within a posterior left upper lobe bronchial, measuring 5 mm on image 22. The previously measured 8 mm density was likely this endobronchial soft tissue nodule and the adjacent vascular pedicle. There is immediately supra adjacent bronchiectasis on image 17 of series 3.  No new nodules are identified. Left suprahilar versus central left upper lobe soft tissue mass is inseparable from the left pulmonary artery. Measures on the order of 3.1 x 2.4 cm on image 29 of series 2. Similar to on the prior exam (when remeasured). This is positioned immediately superior and anterior to the bifurcation of the left upper lobe bronchus.  No pleural fluid.  Heart/Mediastinum: Aortic and branch vessel atherosclerosis. Normal heart size, without pericardial effusion. Multivessel coronary artery atherosclerosis. No middle mediastinal or right hilar adenopathy.  Upper Abdomen: Normal imaged portions of the liver, spleen, stomach, pancreas, adrenal glands, kidneys.  Bones/Musculoskeletal: No acute osseous abnormality.  IMPRESSION: 1. 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 This is intimately associated with the left pulmonary artery. 2. This superior segment right lower lobe opacity/nodule has nearly completely resolved and was likely infectious. 3. An area of endobronchial soft tissue density measures 5 mm within the posterior left upper lobe and is felt to be similar to on the prior exam. This causes adjacent bronchiectasis and could represent an endobronchial synchronous neoplasm or an area of mucous plugging.   Electronically Signed  By: Abigail Miyamoto M.D.  On: 04/15/2014 17:36'  Adequacy Reason Satisfactory For  Evaluation. Diagnosis LEFT HILAR MASS, ENDOSCOPIC ULTRASOUND GUIDED FINE NEEDLE ASPIRATION (C) (SPECIMEN 3 OF 3, COLLECTED ON 04/24/14): POSITIVE FOR SQUAMOUS CELL CARCINOMA. 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 This is consistent with stage I disease and is potentially resectable. It is in close proximity to the left main pulmonary artery, which could preclude a lobectomy. Unfortunately, given his FEV1 of 1.4 I do not believe he would tolerate a pneumonectomy.  The case was discussed with Dr. Julien Nordmann at our [facility] conference. Our consensus was to try 3 cycles of chemotherapy to see if the lesion would shrink enough to provide  an additional margin of safety for a lobectomy.   I discussed this treatment strategy with Larry [name]. He understands the potential benefit of this strategy as well as the risks associated with it.   He does need a brain MR.  I will see him back after chemo/ restaging studies to discuss surgery.

## 2014-06-06 NOTE — Therapy (Signed)
[place], [place], [address] Phone: [phone]   Fax:  [phone]  Physical Therapy Evaluation  Patient Details  Name: Larry [name] MRN: [id number] Date of Birth: 1950-09-25 Referring Provider:  Curt Bears, MD  Encounter Date: 06/06/2014      PT End of Session - 06/06/14 1608    Visit Number 1   Number of Visits 1   Date for PT Re-Evaluation 08/04/14   PT Start Time 6226   PT Stop Time 1550   PT Time Calculation (min) 20 min      Past Medical History  Diagnosis Date  . Hyperlipidemia   . COPD (chronic obstructive pulmonary disease)   . Arthritis     Past Surgical History  Procedure Laterality Date  . Colonoscopy    . Endobronchial ultrasound Bilateral 04/08/2014    Procedure: ENDOBRONCHIAL ULTRASOUND;  Surgeon: Kathee Delton, MD;  Location: [facility];  Service: Cardiopulmonary;  Laterality: Bilateral;  . Video bronchoscopy with endobronchial navigation Right 04/24/2014    Procedure: VIDEO BRONCHOSCOPY WITH ENDOBRONCHIAL NAVIGATION;  Surgeon: Collene Gobble, MD;  Location: [place];  Service: Thoracic;  Laterality: Right;  . Video bronchoscopy with endobronchial ultrasound Right 04/24/2014    Procedure: VIDEO BRONCHOSCOPY WITH ENDOBRONCHIAL ULTRASOUND;  Surgeon: Collene Gobble, MD;  Location: [place];  Service: Thoracic;  Laterality: Right;    There were no vitals taken for this visit.  Visit Diagnosis:  Shortness of breath - Plan: PT plan of care cert/re-cert  Malignant neoplasm of hilus of left lung - Plan: PT plan of care cert/re-cert      Subjective Assessment - 06/06/14 1558    Symptoms Pt. presented with cough and shortness of breath in July.   Pertinent History Chest x-ray showed left hilar mass; had CT chest, PET, and bronchoscopy with fine needle aspiration showing squamous cell carcinoma.  h/o smoking (15 pack-years).  May have chemotherapy first and/or resection, but  needs a new scan first.  Pt. reports having had a "bad back" for a long time, but has nad no treatmt for that.  COPD, dyslipidemia, arthritis.                                                         Currently in Pain? No/denies          [facility] PT Assessment - 06/06/14 0001    Assessment   Medical Diagnosis left hilar mass: squamous cell carcinoma   Precautions   Precautions Other (comment)   Precaution Comments cancer precautions   Restrictions   Weight Bearing Restrictions No   Balance Screen   Has the patient fallen in the past 6 months No   Has the patient had a decrease in activity level because of a fear of falling?  No   Is the patient reluctant to leave their home because of a fear of falling?  No   Home Environment   Living Enviornment Private residence   Living Arrangements Alone   Type of [place] to enter   Prior Function   Level of Independence Independent with basic ADLs;Independent with homemaking with ambulation;Independent with gait  walks to grocery store but has a car   Sensation   Light Touch Not tested  denies numbness  in feet   Posture/Postural Control   Posture/Postural Control No significant limitations   AROM   Overall AROM  Within functional limits for tasks performed  standing active trunk ROM   Strength   Overall Strength Other (comment)   Overall Strength Comments not tested; pt. denies weakness   Ambulation/Gait   Ambulation/Gait Yes   Ambulation/Gait Assistance 7: Independent   Balance   Balance Assessed Yes   Dynamic Standing Balance   Dynamic Standing - Comments reaches 10 inches forward in standing           LYMPHEDEMA/ONCOLOGY QUESTIONNAIRE - 06/06/14 1606    Type   Cancer Type squamous cell, ? stage IIA                       PT Education - 06/06/14 1608    Education provided Yes   Education Details posture, breathing, walking, energy conservation, cough splinting   Person(s) Educated  Patient;Other (comment)  niece   Methods Explanation;Handout;Demonstration   Comprehension Verbalized understanding               Lung Clinic Goals - 06/06/14 1611    Patient will be able to verbalize understanding of the benefit of exercise to decrease fatigue.   Status Achieved   Patient will be able to verbalize the importance of posture.   Status Achieved   Patient will be able to demonstrate diaphragmatic breathing for improved lung function.   Status Achieved   Patient will be able to verbalize understanding of the role of physical therapy to prevent functional decline and who to contact if physical therapy is needed.   Status Achieved             Plan - 06/06/14 1609    Clinical Impression Statement No further therapy at this time, but patient may benefit from therapy to work on his endurance in the future due to shortness of breath.   Pt will benefit from skilled therapeutic intervention in order to improve on the following deficits Decreased endurance;Cardiopulmonary status limiting activity   Rehab Potential Good   PT Frequency One time visit   PT Treatment/Interventions Patient/family education   PT Next Visit Plan None at this time.   Consulted and Agree with Plan of Care Patient;Family member/caregiver  niece         Problem List Patient Active Problem List   Diagnosis Date Noted  . Squamous cell carcinoma of lung, stage IV 05/22/2014  . Lung nodules 03/18/2014  . Hilar adenopathy, left 01/28/2014  . COPD mixed type 01/28/2014  . Tobacco abuse 01/28/2014  . BLOOD IN STOOL, OCCULT 04/24/2008    SALISBURY,DONNA 06/06/2014, 4:14 PM  [place], [place], [address] Phone: [phone]   Fax:  [phone]   Serafina Royals, [place]

## 2014-06-06 NOTE — Patient Instructions (Signed)
Smoking Cessation, Tips for Success  If you are ready to quit smoking, congratulations! You have chosen to help yourself be healthier. Cigarettes bring nicotine, tar, carbon monoxide, and other irritants into your body. Your lungs, heart, and blood vessels will be able to work better without these poisons. There are many different ways to quit smoking. Nicotine gum, nicotine patches, a nicotine inhaler, or nicotine nasal spray can help with physical craving. Hypnosis, support groups, and medicines help break the habit of smoking.  WHAT THINGS CAN I DO TO MAKE QUITTING EASIER?   Here are some tips to help you quit for good:  · Pick a date when you will quit smoking completely. Tell all of your friends and family about your plan to quit on that date.  · Do not try to slowly cut down on the number of cigarettes you are smoking. Pick a quit date and quit smoking completely starting on that day.  · Throw away all cigarettes.    · Clean and remove all ashtrays from your home, work, and car.  · On a card, write down your reasons for quitting. Carry the card with you and read it when you get the urge to smoke.  · Cleanse your body of nicotine. Drink enough water and fluids to keep your urine clear or pale yellow. Do this after quitting to flush the nicotine from your body.  · Learn to predict your moods. Do not let a bad situation be your excuse to have a cigarette. Some situations in your life might tempt you into wanting a cigarette.  · Never have "just one" cigarette. It leads to wanting another and another. Remind yourself of your decision to quit.  · Change habits associated with smoking. If you smoked while driving or when feeling stressed, try other activities to replace smoking. Stand up when drinking your coffee. Brush your teeth after eating. Sit in a different chair when you read the paper. Avoid alcohol while trying to quit, and try to drink fewer caffeinated beverages. Alcohol and caffeine may urge you to  smoke.  · Avoid foods and drinks that can trigger a desire to smoke, such as sugary or spicy foods and alcohol.  · Ask people who smoke not to smoke around you.  · Have something planned to do right after eating or having a cup of coffee. For example, plan to take a walk or exercise.  · Try a relaxation exercise to calm you down and decrease your stress. Remember, you may be tense and nervous for the first 2 weeks after you quit, but this will pass.  · Find new activities to keep your hands busy. Play with a pen, coin, or rubber band. Doodle or draw things on paper.  · Brush your teeth right after eating. This will help cut down on the craving for the taste of tobacco after meals. You can also try mouthwash.    · Use oral substitutes in place of cigarettes. Try using lemon drops, carrots, cinnamon sticks, or chewing gum. Keep them handy so they are available when you have the urge to smoke.  · When you have the urge to smoke, try deep breathing.  · Designate your home as a nonsmoking area.  · If you are a heavy smoker, ask your health care provider about a prescription for nicotine chewing gum. It can ease your withdrawal from nicotine.  · Reward yourself. Set aside the cigarette money you save and buy yourself something nice.  · Look for   support from others. Join a support group or smoking cessation program. Ask someone at home or at work to help you with your plan to quit smoking.  · Always ask yourself, "Do I need this cigarette or is this just a reflex?" Tell yourself, "Today, I choose not to smoke," or "I do not want to smoke." You are reminding yourself of your decision to quit.  · Do not replace cigarette smoking with electronic cigarettes (commonly called e-cigarettes). The safety of e-cigarettes is unknown, and some may contain harmful chemicals.  · If you relapse, do not give up! Plan ahead and think about what you will do the next time you get the urge to smoke.  HOW WILL I FEEL WHEN I QUIT SMOKING?  You  may have symptoms of withdrawal because your body is used to nicotine (the addictive substance in cigarettes). You may crave cigarettes, be irritable, feel very hungry, cough often, get headaches, or have difficulty concentrating. The withdrawal symptoms are only temporary. They are strongest when you first quit but will go away within 10-14 days. When withdrawal symptoms occur, stay in control. Think about your reasons for quitting. Remind yourself that these are signs that your body is healing and getting used to being without cigarettes. Remember that withdrawal symptoms are easier to treat than the major diseases that smoking can cause.   Even after the withdrawal is over, expect periodic urges to smoke. However, these cravings are generally short lived and will go away whether you smoke or not. Do not smoke!  WHAT RESOURCES ARE AVAILABLE TO HELP ME QUIT SMOKING?  Your health care provider can direct you to community resources or hospitals for support, which may include:  · Group support.  · Education.  · Hypnosis.  · Therapy.  Document Released: 02/13/2004 Document Revised: 10/01/2013 Document Reviewed: 11/02/2012  [employer]® Patient Information ©2015 [employer]. This information is not intended to replace advice given to you by your health care provider. Make sure you discuss any questions you have with your health care provider.

## 2014-06-07 ENCOUNTER — Encounter: Payer: Self-pay | Admitting: Internal Medicine

## 2014-06-07 NOTE — Progress Notes (Signed)
[place] [phone]   [phone] Multidisciplinary thoracic oncology clinic OFFICE PROGRESS NOTE  Millsaps, Luane School, NP [facility] [place] [address]  DIAGNOSIS: Stage IIA (T1a, N1, M0) non-small cell lung cancer, squamous cell carcinoma diagnosed in November 2015, presented with left suprahilar soft tissue mass.  PRIOR THERAPY: None  CURRENT THERAPY: Neoadjuvant systemic chemotherapy with carboplatin for AUC of 5 and paclitaxel 175 MG/M2 every 3 weeks with Neulasta support. First dose 06/13/2014.  INTERVAL HISTORY: Larry [name] 64 y.o. male returns to the clinic today for follow-up visit accompanied by his niece. The patient is feeling fine today with no specific complaints. He was supposed to start the first cycle of his systemic chemotherapy earlier this week but we delayed his treatment until he receives evaluation by thoracic surgery for consideration of surgical resection. The patient denied having any significant chest pain but continues to have shortness breath with exertion with no cough or hemoptysis. He denied having any significant weight loss or night sweats. He has no nausea or vomiting, no fever or chills.  MEDICAL HISTORY: Past Medical History  Diagnosis Date  . Hyperlipidemia   . COPD (chronic obstructive pulmonary disease)   . Arthritis     ALLERGIES:  has No Known Allergies.  MEDICATIONS:  Current Outpatient Prescriptions  Medication Sig Dispense Refill  . atorvastatin (LIPITOR) 80 MG tablet Take 80 mg by mouth daily.    . budesonide-formoterol (SYMBICORT) 160-4.5 MCG/ACT inhaler Inhale 2 puffs into the lungs 2 (two) times daily. 1 Inhaler 11  . Omega-3 Fatty Acids (FISH OIL PO) Take 1 tablet by mouth 2 (two) times daily.     . prochlorperazine (COMPAZINE) 10 MG tablet Take 1 tablet (10 mg total) by mouth every 6 (six) hours as needed for nausea or vomiting. (Patient not  taking: Reported on 06/06/2014) 30 tablet 1   No current facility-administered medications for this visit.    SURGICAL HISTORY:  Past Surgical History  Procedure Laterality Date  . Colonoscopy    . Endobronchial ultrasound Bilateral 04/08/2014    Procedure: ENDOBRONCHIAL ULTRASOUND;  Surgeon: Kathee Delton, MD;  Location: [facility];  Service: Cardiopulmonary;  Laterality: Bilateral;  . Video bronchoscopy with endobronchial navigation Right 04/24/2014    Procedure: VIDEO BRONCHOSCOPY WITH ENDOBRONCHIAL NAVIGATION;  Surgeon: Collene Gobble, MD;  Location: [facility];  Service: Thoracic;  Laterality: Right;  . Video bronchoscopy with endobronchial ultrasound Right 04/24/2014    Procedure: VIDEO BRONCHOSCOPY WITH ENDOBRONCHIAL ULTRASOUND;  Surgeon: Collene Gobble, MD;  Location: [facility];  Service: Thoracic;  Laterality: Right;    REVIEW OF SYSTEMS:  Constitutional: positive for fatigue Eyes: negative Ears, nose, mouth, throat, and face: negative Respiratory: positive for dyspnea on exertion Cardiovascular: negative Gastrointestinal: negative Genitourinary:negative Integument/breast: negative Hematologic/lymphatic: negative Musculoskeletal:negative Neurological: negative Behavioral/Psych: negative Endocrine: negative Allergic/Immunologic: negative   PHYSICAL EXAMINATION: General appearance: alert, cooperative, fatigued and no distress Head: Normocephalic, without obvious abnormality, atraumatic Neck: no adenopathy, no JVD, supple, symmetrical, trachea midline and thyroid not enlarged, symmetric, no tenderness/mass/nodules Lymph nodes: Cervical, supraclavicular, and axillary nodes normal. Resp: clear to auscultation bilaterally Back: symmetric, no curvature. ROM normal. No CVA tenderness. Cardio: regular rate and rhythm, S1, S2 normal, no murmur, click, rub or gallop GI: soft, non-tender; bowel sounds normal; no masses,  no organomegaly Extremities: extremities normal, atraumatic, no  cyanosis or edema Neurologic: Alert and oriented X 3, normal strength and tone. Normal symmetric reflexes.  Normal coordination and gait  ECOG PERFORMANCE STATUS: 1 - Symptomatic but completely ambulatory  Blood pressure 151/75, pulse 92, temperature 98.8 F (37.1 C), temperature source Oral, resp. rate 19, height 6\' 2"  (1.88 m), weight 203 lb 4.8 oz (92.216 kg), SpO2 100 %.  LABORATORY DATA: Lab Results  Component Value Date   WBC 9.3 05/22/2014   HGB 13.5 05/22/2014   HCT 38.5 05/22/2014   MCV 77.3* 05/22/2014   PLT 184 05/22/2014      Chemistry      Component Value Date/Time   NA 140 05/22/2014 1055   NA 139 04/22/2014 1132   K 4.0 05/22/2014 1055   K 4.2 04/22/2014 1132   CL 102 04/22/2014 1132   CO2 24 05/22/2014 1055   CO2 23 04/22/2014 1132   BUN 7.0 05/22/2014 1055   BUN 8 04/22/2014 1132   CREATININE 0.8 05/22/2014 1055   CREATININE 0.71 04/22/2014 1132      Component Value Date/Time   CALCIUM 9.7 05/22/2014 1055   CALCIUM 9.5 04/22/2014 1132   ALKPHOS 81 05/22/2014 1055   ALKPHOS 81 04/22/2014 1132   AST 14 05/22/2014 1055   AST 16 04/22/2014 1132   ALT 12 05/22/2014 1055   ALT 13 04/22/2014 1132   BILITOT 0.34 05/22/2014 1055   BILITOT 0.2* 04/22/2014 1132       RADIOGRAPHIC STUDIES: No results found.  ASSESSMENT AND PLAN: This is a very pleasant 64 years old African-American male with potentially resectable stage IIA non-small cell lung cancer, squamous cell carcinoma. The patient is seen by Dr. Roxan Hockey today and he would probably consider him for surgical resection but preferred for the patient to receive few cycles of neoadjuvant chemotherapy first to avoid the need for left pneumonectomy. I had a lengthy discussion with the patient about these recommendations. I recommended for him to proceed with neoadjuvant chemotherapy with carboplatin and paclitaxel as previously scheduled. I don't think the patient would tolerate aggressive treatment with  cisplatin. He is expected to start the first cycle of his chemotherapy on 06/13/2014. He was reminded of the adverse effect of the chemotherapy. He would come back for follow-up visit in 2 weeks for reevaluation and management of any adverse effect of his treatment. He was advised to call immediately if he has any concerning symptoms in the interval. The patient voices understanding of current disease status and treatment options and is in agreement with the current care plan.  All questions were answered. The patient knows to call the clinic with any problems, questions or concerns. We can certainly see the patient much sooner if necessary.  I spent 15 minutes counseling the patient face to face. The total time spent in the appointment was 25 minutes.  Disclaimer: This note was dictated with voice recognition software. Similar sounding words can inadvertently be transcribed and may not be corrected upon review.

## 2014-06-10 ENCOUNTER — Ambulatory Visit (HOSPITAL_COMMUNITY)
Admission: RE | Admit: 2014-06-10 | Discharge: 2014-06-10 | Disposition: A | Discharge status: Home / Self Care | Payer: Medicaid Other | Source: Ambulatory Visit | Attending: Internal Medicine | Admitting: Internal Medicine

## 2014-06-10 ENCOUNTER — Other Ambulatory Visit: Payer: Self-pay | Admitting: *Deleted

## 2014-06-10 ENCOUNTER — Telehealth: Payer: Self-pay | Admitting: Internal Medicine

## 2014-06-10 DIAGNOSIS — C3492 Malignant neoplasm of unspecified part of left bronchus or lung: Secondary | ICD-10-CM

## 2014-06-10 DIAGNOSIS — Z720 Tobacco use: Secondary | ICD-10-CM | POA: Diagnosis not present

## 2014-06-10 MED ORDER — GADOBENATE DIMEGLUMINE 529 MG/ML IV SOLN
20.0000 mL | Freq: Once | INTRAVENOUS | Status: AC | PRN
  Administered 2014-06-10: 19 mL via INTRAVENOUS

## 2014-06-10 NOTE — Telephone Encounter (Signed)
s.w. Peter Congo and advised on 1.14 and 1.21 appts...Marland Kitchenok and aware

## 2014-06-13 ENCOUNTER — Ambulatory Visit (HOSPITAL_BASED_OUTPATIENT_CLINIC_OR_DEPARTMENT_OTHER): Payer: Medicaid Other

## 2014-06-13 ENCOUNTER — Other Ambulatory Visit: Payer: Medicaid Other

## 2014-06-13 ENCOUNTER — Other Ambulatory Visit (HOSPITAL_BASED_OUTPATIENT_CLINIC_OR_DEPARTMENT_OTHER): Payer: Medicaid Other

## 2014-06-13 ENCOUNTER — Encounter: Payer: Self-pay | Admitting: *Deleted

## 2014-06-13 ENCOUNTER — Ambulatory Visit: Payer: Medicaid Other | Admitting: Physician Assistant

## 2014-06-13 DIAGNOSIS — Z5111 Encounter for antineoplastic chemotherapy: Secondary | ICD-10-CM

## 2014-06-13 DIAGNOSIS — C3402 Malignant neoplasm of left main bronchus: Secondary | ICD-10-CM

## 2014-06-13 DIAGNOSIS — C3492 Malignant neoplasm of unspecified part of left bronchus or lung: Secondary | ICD-10-CM

## 2014-06-13 LAB — COMPREHENSIVE METABOLIC PANEL (CC13)
ALK PHOS: 92 U/L (ref 40–150)
ALT: 17 U/L (ref 0–55)
ANION GAP: 7 meq/L (ref 3–11)
AST: 18 U/L (ref 5–34)
Albumin: 3.9 g/dL (ref 3.5–5.0)
BILIRUBIN TOTAL: 0.23 mg/dL (ref 0.20–1.20)
BUN: 8.6 mg/dL (ref 7.0–26.0)
CHLORIDE: 106 meq/L (ref 98–109)
CO2: 24 meq/L (ref 22–29)
CREATININE: 0.9 mg/dL (ref 0.7–1.3)
Calcium: 9.3 mg/dL (ref 8.4–10.4)
EGFR: >90 mL/min/{1.73_m2} (ref >90)
GLUCOSE: 104 mg/dL (ref 70–140)
Potassium: 4 mEq/L (ref 3.5–5.1)
SODIUM: 137 meq/L (ref 136–145)
TOTAL PROTEIN: 7.6 g/dL (ref 6.4–8.3)

## 2014-06-13 LAB — CBC WITH DIFFERENTIAL/PLATELET
BASO%: 0.3 % (ref 0.0–2.0)
Basophils Absolute: 0 10*3/uL (ref 0.0–0.1)
EOS%: 3.6 % (ref 0.0–7.0)
Eosinophils Absolute: 0.3 10*3/uL (ref 0.0–0.5)
HEMATOCRIT: 38.1 % — AB (ref 38.4–49.9)
HEMOGLOBIN: 13.1 g/dL (ref 13.0–17.1)
LYMPH%: 33.1 % (ref 14.0–49.0)
MCH: 26.7 pg — ABNORMAL LOW (ref 27.2–33.4)
MCHC: 34.4 g/dL (ref 32.0–36.0)
MCV: 77.8 fL — AB (ref 79.3–98.0)
MONO#: 0.8 10*3/uL (ref 0.1–0.9)
MONO%: 8.9 % (ref 0.0–14.0)
NEUT#: 4.7 10*3/uL (ref 1.5–6.5)
NEUT%: 54.1 % (ref 39.0–75.0)
Platelets: 217 10*3/uL (ref 140–400)
RBC: 4.9 10*6/uL (ref 4.20–5.82)
RDW: 14.4 % (ref 11.0–14.6)
WBC: 8.7 10*3/uL (ref 4.0–10.3)
lymph#: 2.9 10*3/uL (ref 0.9–3.3)

## 2014-06-13 MED ORDER — ONDANSETRON 16 MG/50ML IVPB (CHCC)
16.0000 mg | Freq: Once | INTRAVENOUS | Status: AC
  Administered 2014-06-13: 16 mg via INTRAVENOUS

## 2014-06-13 MED ORDER — DIPHENHYDRAMINE HCL 50 MG/ML IJ SOLN
50.0000 mg | Freq: Once | INTRAMUSCULAR | Status: AC
  Administered 2014-06-13: 50 mg via INTRAVENOUS

## 2014-06-13 MED ORDER — DEXTROSE 5 % IV SOLN
175.0000 mg/m2 | Freq: Once | INTRAVENOUS | Status: AC
  Administered 2014-06-13: 384 mg via INTRAVENOUS
  Filled 2014-06-13: qty 64

## 2014-06-13 MED ORDER — DEXAMETHASONE SODIUM PHOSPHATE 20 MG/5ML IJ SOLN
20.0000 mg | Freq: Once | INTRAMUSCULAR | Status: AC
  Administered 2014-06-13: 20 mg via INTRAVENOUS

## 2014-06-13 MED ORDER — ONDANSETRON 16 MG/50ML IVPB (CHCC)
INTRAVENOUS | Status: AC
  Filled 2014-06-13: qty 16

## 2014-06-13 MED ORDER — SODIUM CHLORIDE 0.9 % IV SOLN
700.0000 mg | Freq: Once | INTRAVENOUS | Status: AC
  Administered 2014-06-13: 700 mg via INTRAVENOUS
  Filled 2014-06-13: qty 70

## 2014-06-13 MED ORDER — FAMOTIDINE IN NACL 20-0.9 MG/50ML-% IV SOLN
INTRAVENOUS | Status: AC
  Filled 2014-06-13: qty 50

## 2014-06-13 MED ORDER — DEXAMETHASONE SODIUM PHOSPHATE 20 MG/5ML IJ SOLN
INTRAMUSCULAR | Status: AC
  Filled 2014-06-13: qty 5

## 2014-06-13 MED ORDER — FAMOTIDINE IN NACL 20-0.9 MG/50ML-% IV SOLN
20.0000 mg | Freq: Once | INTRAVENOUS | Status: AC
  Administered 2014-06-13: 20 mg via INTRAVENOUS

## 2014-06-13 MED ORDER — SODIUM CHLORIDE 0.9 % IV SOLN
Freq: Once | INTRAVENOUS | Status: AC
  Administered 2014-06-13: 11:00:00 via INTRAVENOUS

## 2014-06-13 MED ORDER — DIPHENHYDRAMINE HCL 50 MG/ML IJ SOLN
INTRAMUSCULAR | Status: AC
  Filled 2014-06-13: qty 1

## 2014-06-13 NOTE — Progress Notes (Signed)
[place] Work  Holiday representative met with patient at [facility] to complete SCAT application. CSW and pt completed application and CSW faxed in forms to SCAT. Pt educated on how to use SCAT and next steps. No other needs noted at this time. Pt's niece, Peter Congo plans to pick him up after his treatment today.     Clinical Social Work interventions: Resource education and assistance  Loren Racer, [place] Worker [facility]  [place] Phone: [phone] Fax: [phone]

## 2014-06-13 NOTE — Patient Instructions (Signed)
[facility] Discharge Instructions for Patients Receiving Chemotherapy  Today you received the following chemotherapy agents taxol, carboplatin To help prevent nausea and vomiting after your treatment, we encourage you to take your nausea medication as prescribed by MD   If you develop nausea and vomiting that is not controlled by your nausea medication, call the clinic.   BELOW ARE SYMPTOMS THAT SHOULD BE REPORTED IMMEDIATELY:  *FEVER GREATER THAN 100.5 F  *CHILLS WITH OR WITHOUT FEVER  NAUSEA AND VOMITING THAT IS NOT CONTROLLED WITH YOUR NAUSEA MEDICATION  *UNUSUAL SHORTNESS OF BREATH  *UNUSUAL BRUISING OR BLEEDING  TENDERNESS IN MOUTH AND THROAT WITH OR WITHOUT PRESENCE OF ULCERS  *URINARY PROBLEMS  *BOWEL PROBLEMS  UNUSUAL RASH Items with * indicate a potential emergency and should be followed up as soon as possible.  Feel free to call the clinic you have any questions or concerns. The clinic phone number is [phone].   Carboplatin injection What is this medicine? CARBOPLATIN (KAR boe pla tin) is a chemotherapy drug. It targets fast dividing cells, like cancer cells, and causes these cells to die. This medicine is used to treat ovarian cancer and many other cancers. This medicine may be used for other purposes; ask your health care provider or pharmacist if you have questions. COMMON BRAND NAME(S): Paraplatin What should I tell my health care provider before I take this medicine? They need to know if you have any of these conditions: -blood disorders -hearing problems -kidney disease -recent or ongoing radiation therapy -an unusual or allergic reaction to carboplatin, cisplatin, other chemotherapy, other medicines, foods, dyes, or preservatives -pregnant or trying to get pregnant -breast-feeding How should I use this medicine? This drug is usually given as an infusion into a vein. It is administered in a hospital or clinic by a specially trained  health care professional. Talk to your pediatrician regarding the use of this medicine in children. Special care may be needed. Overdosage: If you think you have taken too much of this medicine contact a poison control center or emergency room at once. NOTE: This medicine is only for you. Do not share this medicine with others. What if I miss a dose? It is important not to miss a dose. Call your doctor or health care professional if you are unable to keep an appointment. What may interact with this medicine? -medicines for seizures -medicines to increase blood counts like filgrastim, pegfilgrastim, sargramostim -some antibiotics like amikacin, gentamicin, neomycin, streptomycin, tobramycin -vaccines Talk to your doctor or health care professional before taking any of these medicines: -acetaminophen -aspirin -ibuprofen -ketoprofen -naproxen This list may not describe all possible interactions. Give your health care provider a list of all the medicines, herbs, non-prescription drugs, or dietary supplements you use. Also tell them if you smoke, drink alcohol, or use illegal drugs. Some items may interact with your medicine. What should I watch for while using this medicine? Your condition will be monitored carefully while you are receiving this medicine. You will need important blood work done while you are taking this medicine. This drug may make you feel generally unwell. This is not uncommon, as chemotherapy can affect healthy cells as well as cancer cells. Report any side effects. Continue your course of treatment even though you feel ill unless your doctor tells you to stop. In some cases, you may be given additional medicines to help with side effects. Follow all directions for their use. Call your doctor or health care professional for advice if  you get a fever, chills or sore throat, or other symptoms of a cold or flu. Do not treat yourself. This drug decreases your body's ability to fight  infections. Try to avoid being around people who are sick. This medicine may increase your risk to bruise or bleed. Call your doctor or health care professional if you notice any unusual bleeding. Be careful brushing and flossing your teeth or using a toothpick because you may get an infection or bleed more easily. If you have any dental work done, tell your dentist you are receiving this medicine. Avoid taking products that contain aspirin, acetaminophen, ibuprofen, naproxen, or ketoprofen unless instructed by your doctor. These medicines may hide a fever. Do not become pregnant while taking this medicine. Women should inform their doctor if they wish to become pregnant or think they might be pregnant. There is a potential for serious side effects to an unborn child. Talk to your health care professional or pharmacist for more information. Do not breast-feed an infant while taking this medicine. What side effects may I notice from receiving this medicine? Side effects that you should report to your doctor or health care professional as soon as possible: -allergic reactions like skin rash, itching or hives, swelling of the face, lips, or tongue -signs of infection - fever or chills, cough, sore throat, pain or difficulty passing urine -signs of decreased platelets or bleeding - bruising, pinpoint red spots on the skin, black, tarry stools, nosebleeds -signs of decreased red blood cells - unusually weak or tired, fainting spells, lightheadedness -breathing problems -changes in hearing -changes in vision -chest pain -high blood pressure -low blood counts - This drug may decrease the number of white blood cells, red blood cells and platelets. You may be at increased risk for infections and bleeding. -nausea and vomiting -pain, swelling, redness or irritation at the injection site -pain, tingling, numbness in the hands or feet -problems with balance, talking, walking -trouble passing urine or change  in the amount of urine Side effects that usually do not require medical attention (report to your doctor or health care professional if they continue or are bothersome): -hair loss -loss of appetite -metallic taste in the mouth or changes in taste This list may not describe all possible side effects. Call your doctor for medical advice about side effects. You may report side effects to FDA at 1-800-FDA-1088. Where should I keep my medicine? This drug is given in a hospital or clinic and will not be stored at home. NOTE: This sheet is a summary. It may not cover all possible information. If you have questions about this medicine, talk to your doctor, pharmacist, or health care provider.  2015, Elsevier/Gold Standard. (2007-08-22 14:38:05) Paclitaxel injection What is this medicine? PACLITAXEL (PAK li TAX el) is a chemotherapy drug. It targets fast dividing cells, like cancer cells, and causes these cells to die. This medicine is used to treat ovarian cancer, breast cancer, and other cancers. This medicine may be used for other purposes; ask your health care provider or pharmacist if you have questions. COMMON BRAND NAME(S): Onxol, Taxol What should I tell my health care provider before I take this medicine? They need to know if you have any of these conditions: -blood disorders -irregular heartbeat -infection (especially a virus infection such as chickenpox, cold sores, or herpes) -liver disease -previous or ongoing radiation therapy -an unusual or allergic reaction to paclitaxel, alcohol, polyoxyethylated castor oil, other chemotherapy agents, other medicines, foods, dyes, or preservatives -pregnant or  trying to get pregnant -breast-feeding How should I use this medicine? This drug is given as an infusion into a vein. It is administered in a hospital or clinic by a specially trained health care professional. Talk to your pediatrician regarding the use of this medicine in children. Special  care may be needed. Overdosage: If you think you have taken too much of this medicine contact a poison control center or emergency room at once. NOTE: This medicine is only for you. Do not share this medicine with others. What if I miss a dose? It is important not to miss your dose. Call your doctor or health care professional if you are unable to keep an appointment. What may interact with this medicine? Do not take this medicine with any of the following medications: -disulfiram -metronidazole This medicine may also interact with the following medications: -cyclosporine -diazepam -ketoconazole -medicines to increase blood counts like filgrastim, pegfilgrastim, sargramostim -other chemotherapy drugs like cisplatin, doxorubicin, epirubicin, etoposide, teniposide, vincristine -quinidine -testosterone -vaccines -verapamil Talk to your doctor or health care professional before taking any of these medicines: -acetaminophen -aspirin -ibuprofen -ketoprofen -naproxen This list may not describe all possible interactions. Give your health care provider a list of all the medicines, herbs, non-prescription drugs, or dietary supplements you use. Also tell them if you smoke, drink alcohol, or use illegal drugs. Some items may interact with your medicine. What should I watch for while using this medicine? Your condition will be monitored carefully while you are receiving this medicine. You will need important blood work done while you are taking this medicine. This drug may make you feel generally unwell. This is not uncommon, as chemotherapy can affect healthy cells as well as cancer cells. Report any side effects. Continue your course of treatment even though you feel ill unless your doctor tells you to stop. In some cases, you may be given additional medicines to help with side effects. Follow all directions for their use. Call your doctor or health care professional for advice if you get a fever,  chills or sore throat, or other symptoms of a cold or flu. Do not treat yourself. This drug decreases your body's ability to fight infections. Try to avoid being around people who are sick. This medicine may increase your risk to bruise or bleed. Call your doctor or health care professional if you notice any unusual bleeding. Be careful brushing and flossing your teeth or using a toothpick because you may get an infection or bleed more easily. If you have any dental work done, tell your dentist you are receiving this medicine. Avoid taking products that contain aspirin, acetaminophen, ibuprofen, naproxen, or ketoprofen unless instructed by your doctor. These medicines may hide a fever. Do not become pregnant while taking this medicine. Women should inform their doctor if they wish to become pregnant or think they might be pregnant. There is a potential for serious side effects to an unborn child. Talk to your health care professional or pharmacist for more information. Do not breast-feed an infant while taking this medicine. Men are advised not to father a child while receiving this medicine. What side effects may I notice from receiving this medicine? Side effects that you should report to your doctor or health care professional as soon as possible: -allergic reactions like skin rash, itching or hives, swelling of the face, lips, or tongue -low blood counts - This drug may decrease the number of white blood cells, red blood cells and platelets. You may be at  increased risk for infections and bleeding. -signs of infection - fever or chills, cough, sore throat, pain or difficulty passing urine -signs of decreased platelets or bleeding - bruising, pinpoint red spots on the skin, black, tarry stools, nosebleeds -signs of decreased red blood cells - unusually weak or tired, fainting spells, lightheadedness -breathing problems -chest pain -high or low blood pressure -mouth sores -nausea and  vomiting -pain, swelling, redness or irritation at the injection site -pain, tingling, numbness in the hands or feet -slow or irregular heartbeat -swelling of the ankle, feet, hands Side effects that usually do not require medical attention (report to your doctor or health care professional if they continue or are bothersome): -bone pain -complete hair loss including hair on your head, underarms, pubic hair, eyebrows, and eyelashes -changes in the color of fingernails -diarrhea -loosening of the fingernails -loss of appetite -muscle or joint pain -red flush to skin -sweating This list may not describe all possible side effects. Call your doctor for medical advice about side effects. You may report side effects to FDA at 1-800-FDA-1088. Where should I keep my medicine? This drug is given in a hospital or clinic and will not be stored at home. NOTE: This sheet is a summary. It may not cover all possible information. If you have questions about this medicine, talk to your doctor, pharmacist, or health care provider.  2015, Elsevier/Gold Standard. (2012-07-10 16:41:21)

## 2014-06-14 ENCOUNTER — Ambulatory Visit (HOSPITAL_BASED_OUTPATIENT_CLINIC_OR_DEPARTMENT_OTHER): Payer: Medicaid Other

## 2014-06-14 ENCOUNTER — Telehealth: Payer: Self-pay | Admitting: *Deleted

## 2014-06-14 DIAGNOSIS — C3492 Malignant neoplasm of unspecified part of left bronchus or lung: Secondary | ICD-10-CM

## 2014-06-14 DIAGNOSIS — Z5189 Encounter for other specified aftercare: Secondary | ICD-10-CM

## 2014-06-14 DIAGNOSIS — C3402 Malignant neoplasm of left main bronchus: Secondary | ICD-10-CM

## 2014-06-14 MED ORDER — PEGFILGRASTIM INJECTION 6 MG/0.6ML ~~LOC~~
6.0000 mg | PREFILLED_SYRINGE | Freq: Once | SUBCUTANEOUS | Status: AC
  Administered 2014-06-14: 6 mg via SUBCUTANEOUS
  Filled 2014-06-14: qty 0.6

## 2014-06-14 NOTE — Telephone Encounter (Signed)
No adverse effect from chemo. Understands his Neulasta needs to be given at least 24 hours after completion of his last chemo drug. Will be here at 4 pm today.

## 2014-06-14 NOTE — Patient Instructions (Signed)
Pegfilgrastim injection What is this medicine? PEGFILGRASTIM (peg fil GRA stim) is a long-acting granulocyte colony-stimulating factor that stimulates the growth of neutrophils, a type of white blood cell important in the body's fight against infection. It is used to reduce the incidence of fever and infection in patients with certain types of cancer who are receiving chemotherapy that affects the bone marrow. This medicine may be used for other purposes; ask your health care provider or pharmacist if you have questions. COMMON BRAND NAME(S): Neulasta What should I tell my health care provider before I take this medicine? They need to know if you have any of these conditions: -latex allergy -ongoing radiation therapy -sickle cell disease -skin reactions to acrylic adhesives (On-Body Injector only) -an unusual or allergic reaction to pegfilgrastim, filgrastim, other medicines, foods, dyes, or preservatives -pregnant or trying to get pregnant -breast-feeding How should I use this medicine? This medicine is for injection under the skin. If you get this medicine at home, you will be taught how to prepare and give the pre-filled syringe or how to use the On-body Injector. Refer to the patient Instructions for Use for detailed instructions. Use exactly as directed. Take your medicine at regular intervals. Do not take your medicine more often than directed. It is important that you put your used needles and syringes in a special sharps container. Do not put them in a trash can. If you do not have a sharps container, call your pharmacist or healthcare provider to get one. Talk to your pediatrician regarding the use of this medicine in children. Special care may be needed. Overdosage: If you think you have taken too much of this medicine contact a poison control center or emergency room at once. NOTE: This medicine is only for you. Do not share this medicine with others. What if I miss a dose? It is  important not to miss your dose. Call your doctor or health care professional if you miss your dose. If you miss a dose due to an On-body Injector failure or leakage, a new dose should be administered as soon as possible using a single prefilled syringe for manual use. What may interact with this medicine? Interactions have not been studied. Give your health care provider a list of all the medicines, herbs, non-prescription drugs, or dietary supplements you use. Also tell them if you smoke, drink alcohol, or use illegal drugs. Some items may interact with your medicine. This list may not describe all possible interactions. Give your health care provider a list of all the medicines, herbs, non-prescription drugs, or dietary supplements you use. Also tell them if you smoke, drink alcohol, or use illegal drugs. Some items may interact with your medicine. What should I watch for while using this medicine? You may need blood work done while you are taking this medicine. If you are going to need a MRI, CT scan, or other procedure, tell your doctor that you are using this medicine (On-Body Injector only). What side effects may I notice from receiving this medicine? Side effects that you should report to your doctor or health care professional as soon as possible: -allergic reactions like skin rash, itching or hives, swelling of the face, lips, or tongue -dizziness -fever -pain, redness, or irritation at site where injected -pinpoint red spots on the skin -shortness of breath or breathing problems -stomach or side pain, or pain at the shoulder -swelling -tiredness -trouble passing urine Side effects that usually do not require medical attention (report to your doctor   or health care professional if they continue or are bothersome): -bone pain -muscle pain This list may not describe all possible side effects. Call your doctor for medical advice about side effects. You may report side effects to FDA at  1-800-FDA-1088. Where should I keep my medicine? Keep out of the reach of children. Store pre-filled syringes in a refrigerator between 2 and 8 degrees C (36 and 46 degrees F). Do not freeze. Keep in carton to protect from light. Throw away this medicine if it is left out of the refrigerator for more than 48 hours. Throw away any unused medicine after the expiration date. NOTE: This sheet is a summary. It may not cover all possible information. If you have questions about this medicine, talk to your doctor, pharmacist, or health care provider.  2015, Elsevier/Gold Standard. (2013-08-16 16:14:05)  

## 2014-06-20 ENCOUNTER — Other Ambulatory Visit: Payer: Medicaid Other

## 2014-06-20 ENCOUNTER — Ambulatory Visit (HOSPITAL_BASED_OUTPATIENT_CLINIC_OR_DEPARTMENT_OTHER): Payer: Medicaid Other | Admitting: Physician Assistant

## 2014-06-20 ENCOUNTER — Other Ambulatory Visit (HOSPITAL_BASED_OUTPATIENT_CLINIC_OR_DEPARTMENT_OTHER): Payer: Medicaid Other

## 2014-06-20 ENCOUNTER — Encounter: Payer: Self-pay | Admitting: *Deleted

## 2014-06-20 ENCOUNTER — Encounter: Payer: Self-pay | Admitting: Physician Assistant

## 2014-06-20 VITALS — BP 148/81 | HR 97 | Temp 98.6°F | Resp 18 | Ht 74.0 in | Wt 204.2 lb

## 2014-06-20 DIAGNOSIS — C3402 Malignant neoplasm of left main bronchus: Secondary | ICD-10-CM

## 2014-06-20 DIAGNOSIS — C3492 Malignant neoplasm of unspecified part of left bronchus or lung: Secondary | ICD-10-CM

## 2014-06-20 DIAGNOSIS — Z720 Tobacco use: Secondary | ICD-10-CM

## 2014-06-20 LAB — CBC WITH DIFFERENTIAL/PLATELET
BASO%: 0.5 % (ref 0.0–2.0)
Basophils Absolute: 0.1 10*3/uL (ref 0.0–0.1)
EOS ABS: 0.3 10*3/uL (ref 0.0–0.5)
EOS%: 1.1 % (ref 0.0–7.0)
HCT: 37.7 % — ABNORMAL LOW (ref 38.4–49.9)
HEMOGLOBIN: 12.5 g/dL — AB (ref 13.0–17.1)
LYMPH#: 3.1 10*3/uL (ref 0.9–3.3)
LYMPH%: 13.1 % — AB (ref 14.0–49.0)
MCH: 26.5 pg — ABNORMAL LOW (ref 27.2–33.4)
MCHC: 33.1 g/dL (ref 32.0–36.0)
MCV: 80 fL (ref 79.3–98.0)
MONO#: 2.8 10*3/uL — ABNORMAL HIGH (ref 0.1–0.9)
MONO%: 12.2 % (ref 0.0–14.0)
NEUT#: 17.1 10*3/uL — ABNORMAL HIGH (ref 1.5–6.5)
NEUT%: 73.1 % (ref 39.0–75.0)
Platelets: 169 10*3/uL (ref 140–400)
RBC: 4.71 10*6/uL (ref 4.20–5.82)
RDW: 14.3 % (ref 11.0–14.6)
WBC: 23.3 10*3/uL — AB (ref 4.0–10.3)

## 2014-06-20 LAB — COMPREHENSIVE METABOLIC PANEL (CC13)
ALT: 32 U/L (ref 0–55)
ANION GAP: 8 meq/L (ref 3–11)
AST: 24 U/L (ref 5–34)
Albumin: 3.9 g/dL (ref 3.5–5.0)
Alkaline Phosphatase: 155 U/L — ABNORMAL HIGH (ref 40–150)
BILIRUBIN TOTAL: 0.21 mg/dL (ref 0.20–1.20)
BUN: 10.9 mg/dL (ref 7.0–26.0)
CALCIUM: 8.8 mg/dL (ref 8.4–10.4)
CO2: 23 mEq/L (ref 22–29)
Chloride: 106 mEq/L (ref 98–109)
Creatinine: 0.8 mg/dL (ref 0.7–1.3)
EGFR: >90 mL/min/{1.73_m2} (ref >90)
Glucose: 116 mg/dl (ref 70–140)
POTASSIUM: 3.8 meq/L (ref 3.5–5.1)
Sodium: 138 mEq/L (ref 136–145)
Total Protein: 7.5 g/dL (ref 6.4–8.3)

## 2014-06-20 NOTE — [facility] Oncology Navigator Note (Unsigned)
Spoke with patient today regarding smoking cessation.  He stated he is smoking but has reduced to 2 or 3 per day.  We discussed tips to help quit all together.  Patient states he is committed to quit.  He will work on quitting.  I asked that he call me if he is not successful.  He stated he would.

## 2014-06-20 NOTE — Patient Instructions (Signed)
Take care nausea pills as prescribed but only as needed Continue weekly labs as scheduled Follow-up in 2 weeks prior to your next scheduled cycle of chemotherapy She was a quit date to stop smoking completely
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
## 2014-06-21 ENCOUNTER — Telehealth: Payer: Self-pay | Admitting: Internal Medicine

## 2014-06-21 NOTE — Telephone Encounter (Signed)
s.w. pt and advised on appts....pt ok adn aware of all appts

## 2014-06-24 ENCOUNTER — Encounter (HOSPITAL_COMMUNITY): Payer: Self-pay | Admitting: Emergency Medicine

## 2014-06-24 ENCOUNTER — Emergency Department (HOSPITAL_COMMUNITY)
Admission: EM | Admit: 2014-06-24 | Discharge: 2014-06-25 | Disposition: A | Discharge status: Home / Self Care | Payer: Medicaid Other | Attending: Emergency Medicine | Admitting: Emergency Medicine

## 2014-06-24 DIAGNOSIS — Z720 Tobacco use: Secondary | ICD-10-CM | POA: Diagnosis not present

## 2014-06-24 DIAGNOSIS — Z8739 Personal history of other diseases of the musculoskeletal system and connective tissue: Secondary | ICD-10-CM | POA: Insufficient documentation

## 2014-06-24 DIAGNOSIS — R1084 Generalized abdominal pain: Secondary | ICD-10-CM | POA: Insufficient documentation

## 2014-06-24 DIAGNOSIS — Z79899 Other long term (current) drug therapy: Secondary | ICD-10-CM | POA: Insufficient documentation

## 2014-06-24 DIAGNOSIS — Z7951 Long term (current) use of inhaled steroids: Secondary | ICD-10-CM | POA: Insufficient documentation

## 2014-06-24 DIAGNOSIS — R109 Unspecified abdominal pain: Secondary | ICD-10-CM | POA: Diagnosis present

## 2014-06-24 DIAGNOSIS — C3491 Malignant neoplasm of unspecified part of right bronchus or lung: Secondary | ICD-10-CM | POA: Insufficient documentation

## 2014-06-24 DIAGNOSIS — E785 Hyperlipidemia, unspecified: Secondary | ICD-10-CM | POA: Diagnosis not present

## 2014-06-24 DIAGNOSIS — J441 Chronic obstructive pulmonary disease with (acute) exacerbation: Secondary | ICD-10-CM | POA: Diagnosis not present

## 2014-06-24 LAB — COMPREHENSIVE METABOLIC PANEL
ALT: 29 U/L (ref 0–53)
AST: 25 U/L (ref 0–37)
Albumin: 4.1 g/dL (ref 3.5–5.2)
Alkaline Phosphatase: 127 U/L — ABNORMAL HIGH (ref 39–117)
Anion gap: 12 (ref 5–15)
BILIRUBIN TOTAL: 0.4 mg/dL (ref 0.3–1.2)
BUN: 8 mg/dL (ref 6–23)
CALCIUM: 9.3 mg/dL (ref 8.4–10.5)
CO2: 24 mmol/L (ref 19–32)
Chloride: 103 mmol/L (ref 96–112)
Creatinine, Ser: 0.87 mg/dL (ref 0.50–1.35)
GFR calc Af Amer: >90 mL/min (ref >90)
GFR calc non Af Amer: 89 mL/min — ABNORMAL LOW (ref >90)
GLUCOSE: 154 mg/dL — AB (ref 70–99)
Potassium: 4.2 mmol/L (ref 3.5–5.1)
Sodium: 139 mmol/L (ref 135–145)
Total Protein: 7.7 g/dL (ref 6.0–8.3)

## 2014-06-24 LAB — CBC WITH DIFFERENTIAL/PLATELET
Basophils Absolute: 0 10*3/uL (ref 0.0–0.1)
Basophils Relative: 0 % (ref 0–1)
EOS ABS: 0 10*3/uL (ref 0.0–0.7)
Eosinophils Relative: 0 % (ref 0–5)
HEMATOCRIT: 34.8 % — AB (ref 39.0–52.0)
HEMOGLOBIN: 12.2 g/dL — AB (ref 13.0–17.0)
Lymphocytes Relative: 8 % — ABNORMAL LOW (ref 12–46)
Lymphs Abs: 2.1 10*3/uL (ref 0.7–4.0)
MCH: 27.4 pg (ref 26.0–34.0)
MCHC: 35.1 g/dL (ref 30.0–36.0)
MCV: 78.2 fL (ref 78.0–100.0)
MONO ABS: 1.1 10*3/uL — AB (ref 0.1–1.0)
Monocytes Relative: 4 % (ref 3–12)
NEUTROS PCT: 88 % — AB (ref 43–77)
Neutro Abs: 23.5 10*3/uL — ABNORMAL HIGH (ref 1.7–7.7)
PLATELETS: 177 10*3/uL (ref 150–400)
RBC: 4.45 MIL/uL (ref 4.22–5.81)
RDW: 14.2 % (ref 11.5–15.5)
WBC: 26.7 10*3/uL — ABNORMAL HIGH (ref 4.0–10.5)

## 2014-06-24 LAB — LIPASE, BLOOD: LIPASE: 24 U/L (ref 11–59)

## 2014-06-24 MED ORDER — SODIUM CHLORIDE 0.9 % IV BOLUS (SEPSIS)
500.0000 mL | Freq: Once | INTRAVENOUS | Status: AC
  Administered 2014-06-24: 500 mL via INTRAVENOUS

## 2014-06-24 MED ORDER — ONDANSETRON HCL 4 MG/2ML IJ SOLN
4.0000 mg | Freq: Once | INTRAMUSCULAR | Status: AC
  Administered 2014-06-24: 4 mg via INTRAVENOUS
  Filled 2014-06-24: qty 2

## 2014-06-24 MED ORDER — GI COCKTAIL ~~LOC~~
30.0000 mL | Freq: Once | ORAL | Status: AC
  Administered 2014-06-24: 30 mL via ORAL
  Filled 2014-06-24: qty 30

## 2014-06-24 NOTE — ED Notes (Signed)
Pt is aware that urine is needed for sample, urinal at bedside.

## 2014-06-24 NOTE — ED Notes (Signed)
Per EMS, Pt started chemo last week for stage 4 lung cancer. Today around 4pm pt began having abdominal pain, generalized and sharp. About thirty minutes ago pt began to feel nauseous. Pt was advised to call 911 if experiencing any symptoms from chemo. Pt took "brown pill" for the nausea, and has not helped. A&Ox4. Ambulatory.

## 2014-06-24 NOTE — ED Provider Notes (Signed)
CSN: [id number]     Arrival date & time 06/24/14  2052 History   First MD Initiated Contact with Patient 06/24/14 2129     Chief Complaint  Patient presents with  . Abdominal Pain  . Nausea  . Cancer     Patient is a 64 y.o. male presenting with abdominal pain. The history is provided by the patient. No language interpreter was used.  Abdominal Pain  [name] presents for evaluation of abdominal pain. He developed central abdominal pain that feels like pins and needles in his abdomen that started about 4 PM today. He has nausea and dry heaves but no vomiting. No fevers. He denies any diarrhea or constipation. He is able to pass gas without difficulty. He has not had similar symptoms previously. He was started on chemotherapy about a week ago for lung cancer. He's had no prior abdominal surgeries. Symptoms are moderate and constant. There is no radiation of some abdominal pain.  Past Medical History  Diagnosis Date  . Hyperlipidemia   . COPD (chronic obstructive pulmonary disease)   . Arthritis    Past Surgical History  Procedure Laterality Date  . Colonoscopy    . Endobronchial ultrasound Bilateral 04/08/2014    Procedure: ENDOBRONCHIAL ULTRASOUND;  Surgeon: Kathee Delton, MD;  Location: [facility];  Service: Cardiopulmonary;  Laterality: Bilateral;  . Video bronchoscopy with endobronchial navigation Right 04/24/2014    Procedure: VIDEO BRONCHOSCOPY WITH ENDOBRONCHIAL NAVIGATION;  Surgeon: Collene Gobble, MD;  Location: [facility] [place];  Service: Thoracic;  Laterality: Right;  . Video bronchoscopy with endobronchial ultrasound Right 04/24/2014    Procedure: VIDEO BRONCHOSCOPY WITH ENDOBRONCHIAL ULTRASOUND;  Surgeon: Collene Gobble, MD;  Location: [place];  Service: Thoracic;  Laterality: Right;   Family History  Problem Relation Age of Onset  . Heart attack Mother   . Heart attack Father   . Diabetes Mother   . Heart failure Father   . Cancer Brother    History  Substance Use Topics   . Smoking status: Current Some Day Smoker -- 1.00 packs/day for 45 years    Types: Cigarettes  . Smokeless tobacco: Never Used     Comment: 3-4 cigs daily 04/12/14.   Marland Kitchen Alcohol Use: No    Review of Systems  Gastrointestinal: Positive for abdominal pain.  All other systems reviewed and are negative.     Allergies  Review of patient's allergies indicates no known allergies.  Home Medications   Prior to Admission medications   Medication Sig Start Date End Date Taking? Authorizing Provider  atorvastatin (LIPITOR) 80 MG tablet Take 80 mg by mouth daily.   Yes Historical Provider, MD  budesonide-formoterol (SYMBICORT) 160-4.5 MCG/ACT inhaler Inhale 2 puffs into the lungs 2 (two) times daily. 03/21/14  Yes Deneise Lever, MD  Omega-3 Fatty Acids (FISH OIL PO) Take 1 tablet by mouth 2 (two) times daily.    Yes Historical Provider, MD  prochlorperazine (COMPAZINE) 10 MG tablet Take 1 tablet (10 mg total) by mouth every 6 (six) hours as needed for nausea or vomiting. 05/28/14  Yes Curt Bears, MD   BP 149/79 mmHg  Pulse 78  Temp(Src) 98.8 F (37.1 C) (Oral)  Resp 18  Ht 6\' 2"  (1.88 m)  Wt 204 lb (92.534 kg)  BMI 26.18 kg/m2  SpO2 98% Physical Exam  Constitutional: He is oriented to person, place, and time. He appears well-developed and well-nourished.  HENT:  Head: Normocephalic and atraumatic.  Cardiovascular: Normal rate and regular rhythm.  No murmur heard. Pulmonary/Chest: Effort normal. No respiratory distress.  Wheezing and rhonchi intact  Abdominal: Soft. There is no rebound and no guarding.  Mild diffuse abdominal tenderness  Musculoskeletal: He exhibits no edema or tenderness.  Neurological: He is alert and oriented to person, place, and time.  Skin: Skin is warm and dry.  Psychiatric: He has a normal mood and affect. His behavior is normal.  Nursing note and vitals reviewed.   ED Course  Procedures (including critical care time) Labs Review Labs  Reviewed  COMPREHENSIVE METABOLIC PANEL - Abnormal; Notable for the following:    Glucose, Bld 154 (*)    Alkaline Phosphatase 127 (*)    GFR calc non Af Amer 89 (*)    All other components within normal limits  CBC WITH DIFFERENTIAL/PLATELET - Abnormal; Notable for the following:    WBC 26.7 (*)    Hemoglobin 12.2 (*)    HCT 34.8 (*)    Neutrophils Relative % 88 (*)    Lymphocytes Relative 8 (*)    Neutro Abs 23.5 (*)    Monocytes Absolute 1.1 (*)    All other components within normal limits  URINALYSIS, ROUTINE W REFLEX MICROSCOPIC - Abnormal; Notable for the following:    APPearance CLOUDY (*)    Specific Gravity, Urine 1.036 (*)    Protein, ur 30 (*)    All other components within normal limits  I-STAT CG4 LACTIC ACID, ED - Abnormal; Notable for the following:    Lactic Acid, Venous 2.29 (*)    All other components within normal limits  LIPASE, BLOOD  URINE MICROSCOPIC-ADD ON    Imaging Review Ct Abdomen Pelvis W Contrast  06/25/2014   CLINICAL DATA:  Generalized sharp abdominal pain and nausea. Recently started chemotherapy for stage IV lung cancer.  EXAM: CT ABDOMEN AND PELVIS WITH CONTRAST  TECHNIQUE: Multidetector CT imaging of the abdomen and pelvis was performed using the standard protocol following bolus administration of intravenous contrast.  CONTRAST:  190mL OMNIPAQUE IOHEXOL 300 MG/ML  SOLN  COMPARISON:  CT chest 04/15/2014.  PET-CT 02/05/2014.  FINDINGS: Focal infiltration or atelectasis in the right lung base.  The liver, spleen, gallbladder, pancreas, adrenal glands, inferior vena cava, and retroperitoneal lymph nodes are unremarkable. Calcification of the abdominal aorta without aneurysm. Small subcentimeter cysts in the kidneys. No hydronephrosis in either kidney. The stomach, small bowel, and colon are normal for degree of distention. Small periumbilical hernia containing fat. No free air or free fluid in the abdomen.  Pelvis: The appendix is normal. Diverticulosis  of the sigmoid colon without evidence of diverticulitis. Prostate gland is not enlarged. Bladder wall is not thickened. No free or loculated pelvic fluid collections. No pelvic mass or lymphadenopathy. Degenerative changes in the lower lumbar spine. Circumscribed lucency in the sacrum towards the right. This was present on prior PET-CT without interval change and likely represents degenerative cyst.  IMPRESSION: Focal infiltration or atelectasis in the right lung base. No acute process demonstrated in the abdomen or pelvis. Small umbilical hernia containing fat. Sub cm renal cysts.   Electronically Signed   By: Lucienne Capers M.D.   On: 06/25/2014 01:53     EKG Interpretation None      MDM   Final diagnoses:  Abdominal pain in male    Patient here for evaluation of abdominal pain after chemotherapy. Patient's abdominal exam is benign but he does have some ongoing discomfort in the emergency department. He did not have any change with GI cocktail.  He likely has pain secondary to his chemotherapy use, but given leukocytosis will check CT abdomen to rule out other causes. Clinical picture is not consistent with cholecystitis or acute appendicitis. Patient's leukocytosis may be secondary to Neulasta injection.    Quintella Reichert, MD 06/25/14 [phone]

## 2014-06-24 NOTE — ED Notes (Signed)
Pt provided w/ sprite for PO challenge.

## 2014-06-25 ENCOUNTER — Encounter (HOSPITAL_COMMUNITY): Payer: Self-pay

## 2014-06-25 ENCOUNTER — Emergency Department (HOSPITAL_COMMUNITY): Payer: Medicaid Other

## 2014-06-25 LAB — URINE MICROSCOPIC-ADD ON

## 2014-06-25 LAB — URINALYSIS, ROUTINE W REFLEX MICROSCOPIC
Bilirubin Urine: NEGATIVE
Glucose, UA: NEGATIVE mg/dL
Hgb urine dipstick: NEGATIVE
KETONES UR: NEGATIVE mg/dL
Leukocytes, UA: NEGATIVE
Nitrite: NEGATIVE
Protein, ur: 30 mg/dL — AB
SPECIFIC GRAVITY, URINE: 1.036 — AB (ref 1.005–1.030)
Urobilinogen, UA: 0.2 mg/dL (ref 0.0–1.0)
pH: 7.5 (ref 5.0–8.0)

## 2014-06-25 LAB — I-STAT CG4 LACTIC ACID, ED: Lactic Acid, Venous: 2.29 mmol/L (ref 0.5–2.0)

## 2014-06-25 MED ORDER — ONDANSETRON 8 MG PO TBDP: 8.0000 mg | ORAL_TABLET | Freq: Three times a day (TID) | ORAL | Status: DC | PRN

## 2014-06-25 MED ORDER — HYDROCODONE-ACETAMINOPHEN 5-325 MG PO TABS: 1.0000 | ORAL_TABLET | Freq: Four times a day (QID) | ORAL | Status: DC | PRN

## 2014-06-25 MED ORDER — IOHEXOL 300 MG/ML  SOLN
50.0000 mL | Freq: Once | INTRAMUSCULAR | Status: AC | PRN
  Administered 2014-06-25: 50 mL via ORAL

## 2014-06-25 MED ORDER — IOHEXOL 300 MG/ML  SOLN
100.0000 mL | Freq: Once | INTRAMUSCULAR | Status: AC | PRN
  Administered 2014-06-25: 100 mL via INTRAVENOUS

## 2014-06-25 NOTE — ED Notes (Signed)
Pt stated that he is not able to urinate.   Will try again in 30 minutes.

## 2014-06-25 NOTE — Discharge Instructions (Signed)
We saw you in the ER for the abdominal pain. All of our results are normal, including all labs and imaging. Kidney function is fine as well. We are not sure what is causing your abdominal pain, and recommend that you see your primary care doctor within 2-3 days for further evaluation. If your symptoms get worse, return to the ER. Take the pain meds and nausea meds as prescribed.   Abdominal Pain Many things can cause abdominal pain. Usually, abdominal pain is not caused by a disease and will improve without treatment. It can often be observed and treated at home. Your health care provider will do a physical exam and possibly order blood tests and X-rays to help determine the seriousness of your pain. However, in many cases, more time must pass before a clear cause of the pain can be found. Before that point, your health care provider may not know if you need more testing or further treatment. HOME CARE INSTRUCTIONS  Monitor your abdominal pain for any changes. The following actions may help to alleviate any discomfort you are experiencing:  Only take over-the-counter or prescription medicines as directed by your health care provider.  Do not take laxatives unless directed to do so by your health care provider.  Try a clear liquid diet (broth, tea, or water) as directed by your health care provider. Slowly move to a bland diet as tolerated. SEEK MEDICAL CARE IF:  You have unexplained abdominal pain.  You have abdominal pain associated with nausea or diarrhea.  You have pain when you urinate or have a bowel movement.  You experience abdominal pain that wakes you in the night.  You have abdominal pain that is worsened or improved by eating food.  You have abdominal pain that is worsened with eating fatty foods.  You have a fever. SEEK IMMEDIATE MEDICAL CARE IF:   Your pain does not go away within 2 hours.  You keep throwing up (vomiting).  Your pain is felt only in portions of the  abdomen, such as the right side or the left lower portion of the abdomen.  You pass bloody or black tarry stools. MAKE SURE YOU:  Understand these instructions.   Will watch your condition.   Will get help right away if you are not doing well or get worse.  Document Released: 02/24/2005 Document Revised: 05/22/2013 Document Reviewed: 01/24/2013 [facility] Patient Information 2015 [place], [place]. This information is not intended to replace advice given to you by your health care provider. Make sure you discuss any questions you have with your health care provider.

## 2014-06-25 NOTE — ED Provider Notes (Addendum)
  Physical Exam  BP 142/77 mmHg  Pulse 84  Temp(Src) 98.8 F (37.1 C) (Oral)  Resp 18  Ht 6\' 2"  (1.88 m)  Wt 204 lb (92.534 kg)  BMI 26.18 kg/m2  SpO2 98%  Physical Exam  ED Course  Procedures  MDM Pt  Comes in with cc of abd pain. Pt has stage 2 lung CA, comes in with abd pain. He has diffuse tenderness, no peritoneal findings. Pt has leukocytosis. Elevated WBC - suspected from his mefications.  CT abd ordered, if neg and pain in control then pt can be discharged.  Filed Vitals:   06/24/14 2338  BP: 142/77  Pulse: 84  Temp:   Resp: 18      Celester Lech, MD 06/25/14 0112  7:13 AM CT scan shows no acute findings. UA is normal. Pt has leukocytosis - no fevers. Dr. Ayesha Rumpf already looked into the cause of the elevated WC, and she is sure it is not infectious. Pt has had leukocytosis before.  Post CT - abd exam is non peritoneal, and with distraction he had no tenderness. PO challenge passed. Will d.c. Return precautions discussed.   Varney Biles, MD 06/25/14 [phone]

## 2014-06-26 ENCOUNTER — Ambulatory Visit: Payer: Medicaid Other

## 2014-06-27 ENCOUNTER — Other Ambulatory Visit (HOSPITAL_BASED_OUTPATIENT_CLINIC_OR_DEPARTMENT_OTHER): Payer: Medicaid Other

## 2014-06-27 ENCOUNTER — Other Ambulatory Visit: Payer: Self-pay | Admitting: Medical Oncology

## 2014-06-27 DIAGNOSIS — C3492 Malignant neoplasm of unspecified part of left bronchus or lung: Secondary | ICD-10-CM

## 2014-06-27 DIAGNOSIS — C3402 Malignant neoplasm of left main bronchus: Secondary | ICD-10-CM

## 2014-06-27 LAB — CBC WITH DIFFERENTIAL/PLATELET
BASO%: 0.2 % (ref 0.0–2.0)
BASOS ABS: 0 10*3/uL (ref 0.0–0.1)
EOS%: 0.9 % (ref 0.0–7.0)
Eosinophils Absolute: 0.2 10*3/uL (ref 0.0–0.5)
HEMATOCRIT: 35.3 % — AB (ref 38.4–49.9)
HGB: 12.5 g/dL — ABNORMAL LOW (ref 13.0–17.1)
LYMPH%: 7.9 % — AB (ref 14.0–49.0)
MCH: 27.1 pg — AB (ref 27.2–33.4)
MCHC: 35.4 g/dL (ref 32.0–36.0)
MCV: 76.6 fL — AB (ref 79.3–98.0)
MONO#: 1.2 10*3/uL — ABNORMAL HIGH (ref 0.1–0.9)
MONO%: 6.9 % (ref 0.0–14.0)
NEUT%: 84.1 % — ABNORMAL HIGH (ref 39.0–75.0)
NEUTROS ABS: 13.9 10*3/uL — AB (ref 1.5–6.5)
Platelets: 138 10*3/uL — ABNORMAL LOW (ref 140–400)
RBC: 4.61 10*6/uL (ref 4.20–5.82)
RDW: 14.2 % (ref 11.0–14.6)
WBC: 16.6 10*3/uL — AB (ref 4.0–10.3)
lymph#: 1.3 10*3/uL (ref 0.9–3.3)

## 2014-06-27 LAB — COMPREHENSIVE METABOLIC PANEL (CC13)
ALT: 144 U/L — ABNORMAL HIGH (ref 0–55)
AST: 66 U/L — ABNORMAL HIGH (ref 5–34)
Albumin: 3.3 g/dL — ABNORMAL LOW (ref 3.5–5.0)
Alkaline Phosphatase: 275 U/L — ABNORMAL HIGH (ref 40–150)
Anion Gap: 11 mEq/L (ref 3–11)
BILIRUBIN TOTAL: 4.43 mg/dL — AB (ref 0.20–1.20)
BUN: 10.9 mg/dL (ref 7.0–26.0)
CALCIUM: 9.2 mg/dL (ref 8.4–10.4)
CO2: 20 mEq/L — ABNORMAL LOW (ref 22–29)
CREATININE: 0.9 mg/dL (ref 0.7–1.3)
Chloride: 104 mEq/L (ref 98–109)
EGFR: >90 mL/min/{1.73_m2} (ref >90)
Glucose: 135 mg/dl (ref 70–140)
POTASSIUM: 3.5 meq/L (ref 3.5–5.1)
Sodium: 135 mEq/L — ABNORMAL LOW (ref 136–145)
Total Protein: 7.6 g/dL (ref 6.4–8.3)

## 2014-06-27 NOTE — Progress Notes (Signed)
Per Julien Nordmann he reviewed CT scan and ordered redraw bilirubin next week . I spoke to pt in lobby . He denies abdominal pain, n/v/d/c or any other problems. I advised pt to call for any new abdominal pain , nausea vomiting and he understands. He is aware of redraw next week.

## 2014-07-04 ENCOUNTER — Encounter: Payer: Self-pay | Admitting: Internal Medicine

## 2014-07-04 ENCOUNTER — Other Ambulatory Visit (HOSPITAL_BASED_OUTPATIENT_CLINIC_OR_DEPARTMENT_OTHER): Payer: Medicaid Other

## 2014-07-04 ENCOUNTER — Ambulatory Visit (HOSPITAL_BASED_OUTPATIENT_CLINIC_OR_DEPARTMENT_OTHER): Payer: Medicaid Other | Admitting: Internal Medicine

## 2014-07-04 ENCOUNTER — Encounter: Payer: Self-pay | Admitting: *Deleted

## 2014-07-04 ENCOUNTER — Telehealth: Payer: Self-pay | Admitting: Internal Medicine

## 2014-07-04 ENCOUNTER — Ambulatory Visit (HOSPITAL_BASED_OUTPATIENT_CLINIC_OR_DEPARTMENT_OTHER): Payer: Medicaid Other

## 2014-07-04 ENCOUNTER — Other Ambulatory Visit: Payer: Medicaid Other

## 2014-07-04 VITALS — BP 125/83 | HR 87 | Temp 98.4°F | Resp 18 | Ht 74.0 in | Wt 198.1 lb

## 2014-07-04 DIAGNOSIS — C3402 Malignant neoplasm of left main bronchus: Secondary | ICD-10-CM

## 2014-07-04 DIAGNOSIS — C3492 Malignant neoplasm of unspecified part of left bronchus or lung: Secondary | ICD-10-CM

## 2014-07-04 DIAGNOSIS — Z5111 Encounter for antineoplastic chemotherapy: Secondary | ICD-10-CM

## 2014-07-04 LAB — COMPREHENSIVE METABOLIC PANEL (CC13)
ALT: 66 U/L — AB (ref 0–55)
ANION GAP: 11 meq/L (ref 3–11)
AST: 34 U/L (ref 5–34)
Albumin: 3.4 g/dL — ABNORMAL LOW (ref 3.5–5.0)
Alkaline Phosphatase: 209 U/L — ABNORMAL HIGH (ref 40–150)
BUN: 9.4 mg/dL (ref 7.0–26.0)
CHLORIDE: 104 meq/L (ref 98–109)
CO2: 24 meq/L (ref 22–29)
Calcium: 9.9 mg/dL (ref 8.4–10.4)
Creatinine: 0.8 mg/dL (ref 0.7–1.3)
Glucose: 132 mg/dl (ref 70–140)
POTASSIUM: 4.2 meq/L (ref 3.5–5.1)
Sodium: 139 mEq/L (ref 136–145)
TOTAL PROTEIN: 8 g/dL (ref 6.4–8.3)
Total Bilirubin: 1.01 mg/dL (ref 0.20–1.20)

## 2014-07-04 LAB — CBC WITH DIFFERENTIAL/PLATELET
BASO%: 0.9 % (ref 0.0–2.0)
BASOS ABS: 0.1 10*3/uL (ref 0.0–0.1)
EOS%: 1.2 % (ref 0.0–7.0)
Eosinophils Absolute: 0.1 10*3/uL (ref 0.0–0.5)
HEMATOCRIT: 34.3 % — AB (ref 38.4–49.9)
HEMOGLOBIN: 11.8 g/dL — AB (ref 13.0–17.1)
LYMPH#: 2.2 10*3/uL (ref 0.9–3.3)
LYMPH%: 21.4 % (ref 14.0–49.0)
MCH: 27.1 pg — AB (ref 27.2–33.4)
MCHC: 34.4 g/dL (ref 32.0–36.0)
MCV: 78.7 fL — AB (ref 79.3–98.0)
MONO#: 1.2 10*3/uL — AB (ref 0.1–0.9)
MONO%: 11.3 % (ref 0.0–14.0)
NEUT%: 65.2 % (ref 39.0–75.0)
NEUTROS ABS: 6.8 10*3/uL — AB (ref 1.5–6.5)
PLATELETS: 466 10*3/uL — AB (ref 140–400)
RBC: 4.36 10*6/uL (ref 4.20–5.82)
RDW: 14.7 % — AB (ref 11.0–14.6)
WBC: 10.4 10*3/uL — ABNORMAL HIGH (ref 4.0–10.3)

## 2014-07-04 MED ORDER — ONDANSETRON 16 MG/50ML IVPB (CHCC)
INTRAVENOUS | Status: AC
  Filled 2014-07-04: qty 16

## 2014-07-04 MED ORDER — ONDANSETRON 16 MG/50ML IVPB (CHCC)
16.0000 mg | Freq: Once | INTRAVENOUS | Status: AC
  Administered 2014-07-04: 16 mg via INTRAVENOUS

## 2014-07-04 MED ORDER — DEXAMETHASONE SODIUM PHOSPHATE 20 MG/5ML IJ SOLN
INTRAMUSCULAR | Status: AC
  Filled 2014-07-04: qty 5

## 2014-07-04 MED ORDER — FAMOTIDINE IN NACL 20-0.9 MG/50ML-% IV SOLN
INTRAVENOUS | Status: AC
Start: 2014-07-04 — End: 2014-07-04
  Filled 2014-07-04: qty 50

## 2014-07-04 MED ORDER — SODIUM CHLORIDE 0.9 % IV SOLN
Freq: Once | INTRAVENOUS | Status: AC
  Administered 2014-07-04: 10:00:00 via INTRAVENOUS

## 2014-07-04 MED ORDER — PACLITAXEL CHEMO INJECTION 300 MG/50ML
175.0000 mg/m2 | Freq: Once | INTRAVENOUS | Status: AC
  Administered 2014-07-04: 384 mg via INTRAVENOUS
  Filled 2014-07-04: qty 64

## 2014-07-04 MED ORDER — DEXAMETHASONE SODIUM PHOSPHATE 20 MG/5ML IJ SOLN
20.0000 mg | Freq: Once | INTRAMUSCULAR | Status: AC
  Administered 2014-07-04: 20 mg via INTRAVENOUS

## 2014-07-04 MED ORDER — DIPHENHYDRAMINE HCL 50 MG/ML IJ SOLN
50.0000 mg | Freq: Once | INTRAMUSCULAR | Status: AC
  Administered 2014-07-04: 50 mg via INTRAVENOUS

## 2014-07-04 MED ORDER — SODIUM CHLORIDE 0.9 % IV SOLN
700.0000 mg | Freq: Once | INTRAVENOUS | Status: AC
  Administered 2014-07-04: 700 mg via INTRAVENOUS
  Filled 2014-07-04: qty 70

## 2014-07-04 MED ORDER — DIPHENHYDRAMINE HCL 50 MG/ML IJ SOLN
INTRAMUSCULAR | Status: AC
  Filled 2014-07-04: qty 1

## 2014-07-04 MED ORDER — FAMOTIDINE IN NACL 20-0.9 MG/50ML-% IV SOLN
20.0000 mg | Freq: Once | INTRAVENOUS | Status: AC
  Administered 2014-07-04: 20 mg via INTRAVENOUS

## 2014-07-04 NOTE — Patient Instructions (Signed)
[facility] Discharge Instructions for Patients Receiving Chemotherapy  Today you received the following chemotherapy agents: Taxol and Carboplatin.  To help prevent nausea and vomiting after your treatment, we encourage you to take your nausea medication as prescribed.   If you develop nausea and vomiting that is not controlled by your nausea medication, call the clinic.   BELOW ARE SYMPTOMS THAT SHOULD BE REPORTED IMMEDIATELY:  *FEVER GREATER THAN 100.5 F  *CHILLS WITH OR WITHOUT FEVER  NAUSEA AND VOMITING THAT IS NOT CONTROLLED WITH YOUR NAUSEA MEDICATION  *UNUSUAL SHORTNESS OF BREATH  *UNUSUAL BRUISING OR BLEEDING  TENDERNESS IN MOUTH AND THROAT WITH OR WITHOUT PRESENCE OF ULCERS  *URINARY PROBLEMS  *BOWEL PROBLEMS  UNUSUAL RASH Items with * indicate a potential emergency and should be followed up as soon as possible.  Feel free to call the clinic you have any questions or concerns. The clinic phone number is [phone].    

## 2014-07-04 NOTE — [facility] Oncology Navigator Note (Unsigned)
Spoke with Mr. Silverio today at clinic.  He is doing well.  He gave me insurance papers and I took to [place] to complete.   I spoke to Mr. Ostrow about smoking cessation.  He stated he quit a few days ago  I asked what was working for him. He stated that peppermint candy is working best for him.

## 2014-07-04 NOTE — Patient Instructions (Signed)
Smoking Cessation, Tips for Success  If you are ready to quit smoking, congratulations! You have chosen to help yourself be healthier. Cigarettes bring nicotine, tar, carbon monoxide, and other irritants into your body. Your lungs, heart, and blood vessels will be able to work better without these poisons. There are many different ways to quit smoking. Nicotine gum, nicotine patches, a nicotine inhaler, or nicotine nasal spray can help with physical craving. Hypnosis, support groups, and medicines help break the habit of smoking.  WHAT THINGS CAN I DO TO MAKE QUITTING EASIER?   Here are some tips to help you quit for good:  · Pick a date when you will quit smoking completely. Tell all of your friends and family about your plan to quit on that date.  · Do not try to slowly cut down on the number of cigarettes you are smoking. Pick a quit date and quit smoking completely starting on that day.  · Throw away all cigarettes.    · Clean and remove all ashtrays from your home, work, and car.  · On a card, write down your reasons for quitting. Carry the card with you and read it when you get the urge to smoke.  · Cleanse your body of nicotine. Drink enough water and fluids to keep your urine clear or pale yellow. Do this after quitting to flush the nicotine from your body.  · Learn to predict your moods. Do not let a bad situation be your excuse to have a cigarette. Some situations in your life might tempt you into wanting a cigarette.  · Never have "just one" cigarette. It leads to wanting another and another. Remind yourself of your decision to quit.  · Change habits associated with smoking. If you smoked while driving or when feeling stressed, try other activities to replace smoking. Stand up when drinking your coffee. Brush your teeth after eating. Sit in a different chair when you read the paper. Avoid alcohol while trying to quit, and try to drink fewer caffeinated beverages. Alcohol and caffeine may urge you to  smoke.  · Avoid foods and drinks that can trigger a desire to smoke, such as sugary or spicy foods and alcohol.  · Ask people who smoke not to smoke around you.  · Have something planned to do right after eating or having a cup of coffee. For example, plan to take a walk or exercise.  · Try a relaxation exercise to calm you down and decrease your stress. Remember, you may be tense and nervous for the first 2 weeks after you quit, but this will pass.  · Find new activities to keep your hands busy. Play with a pen, coin, or rubber band. Doodle or draw things on paper.  · Brush your teeth right after eating. This will help cut down on the craving for the taste of tobacco after meals. You can also try mouthwash.    · Use oral substitutes in place of cigarettes. Try using lemon drops, carrots, cinnamon sticks, or chewing gum. Keep them handy so they are available when you have the urge to smoke.  · When you have the urge to smoke, try deep breathing.  · Designate your home as a nonsmoking area.  · If you are a heavy smoker, ask your health care provider about a prescription for nicotine chewing gum. It can ease your withdrawal from nicotine.  · Reward yourself. Set aside the cigarette money you save and buy yourself something nice.  · Look for   support from others. Join a support group or smoking cessation program. Ask someone at home or at work to help you with your plan to quit smoking.  · Always ask yourself, "Do I need this cigarette or is this just a reflex?" Tell yourself, "Today, I choose not to smoke," or "I do not want to smoke." You are reminding yourself of your decision to quit.  · Do not replace cigarette smoking with electronic cigarettes (commonly called e-cigarettes). The safety of e-cigarettes is unknown, and some may contain harmful chemicals.  · If you relapse, do not give up! Plan ahead and think about what you will do the next time you get the urge to smoke.  HOW WILL I FEEL WHEN I QUIT SMOKING?  You  may have symptoms of withdrawal because your body is used to nicotine (the addictive substance in cigarettes). You may crave cigarettes, be irritable, feel very hungry, cough often, get headaches, or have difficulty concentrating. The withdrawal symptoms are only temporary. They are strongest when you first quit but will go away within 10-14 days. When withdrawal symptoms occur, stay in control. Think about your reasons for quitting. Remind yourself that these are signs that your body is healing and getting used to being without cigarettes. Remember that withdrawal symptoms are easier to treat than the major diseases that smoking can cause.   Even after the withdrawal is over, expect periodic urges to smoke. However, these cravings are generally short lived and will go away whether you smoke or not. Do not smoke!  WHAT RESOURCES ARE AVAILABLE TO HELP ME QUIT SMOKING?  Your health care provider can direct you to community resources or hospitals for support, which may include:  · Group support.  · Education.  · Hypnosis.  · Therapy.  Document Released: 02/13/2004 Document Revised: 10/01/2013 Document Reviewed: 11/02/2012  [employer]® Patient Information ©2015 [employer]. This information is not intended to replace advice given to you by your health care provider. Make sure you discuss any questions you have with your health care provider.

## 2014-07-04 NOTE — Progress Notes (Signed)
Put [employer] form on nurse's desk
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
## 2014-07-04 NOTE — Telephone Encounter (Signed)
gv and printed appt sched and avs for pt for Feb and March....sed added tx.

## 2014-07-05 ENCOUNTER — Encounter: Payer: Self-pay | Admitting: Internal Medicine

## 2014-07-05 ENCOUNTER — Encounter: Payer: Self-pay | Admitting: Medical Oncology

## 2014-07-05 ENCOUNTER — Ambulatory Visit (HOSPITAL_BASED_OUTPATIENT_CLINIC_OR_DEPARTMENT_OTHER): Payer: Medicaid Other

## 2014-07-05 DIAGNOSIS — Z5189 Encounter for other specified aftercare: Secondary | ICD-10-CM

## 2014-07-05 DIAGNOSIS — C3492 Malignant neoplasm of unspecified part of left bronchus or lung: Secondary | ICD-10-CM

## 2014-07-05 DIAGNOSIS — C3402 Malignant neoplasm of left main bronchus: Secondary | ICD-10-CM

## 2014-07-05 MED ORDER — PEGFILGRASTIM INJECTION 6 MG/0.6ML ~~LOC~~
6.0000 mg | PREFILLED_SYRINGE | Freq: Once | SUBCUTANEOUS | Status: AC
  Administered 2014-07-05: 6 mg via SUBCUTANEOUS
  Filled 2014-07-05: qty 0.6

## 2014-07-05 NOTE — Progress Notes (Signed)
employee for given to [place].

## 2014-07-05 NOTE — Progress Notes (Signed)
Put [employer] form in registration desk

## 2014-07-11 ENCOUNTER — Other Ambulatory Visit (HOSPITAL_BASED_OUTPATIENT_CLINIC_OR_DEPARTMENT_OTHER): Payer: Medicaid Other

## 2014-07-11 DIAGNOSIS — C3492 Malignant neoplasm of unspecified part of left bronchus or lung: Secondary | ICD-10-CM

## 2014-07-11 DIAGNOSIS — C3490 Malignant neoplasm of unspecified part of unspecified bronchus or lung: Secondary | ICD-10-CM

## 2014-07-11 LAB — CBC WITH DIFFERENTIAL/PLATELET
BASO%: 0.3 % (ref 0.0–2.0)
Basophils Absolute: 0.1 10*3/uL (ref 0.0–0.1)
EOS ABS: 0.5 10*3/uL (ref 0.0–0.5)
EOS%: 1.9 % (ref 0.0–7.0)
HCT: 34.9 % — ABNORMAL LOW (ref 38.4–49.9)
HGB: 12.3 g/dL — ABNORMAL LOW (ref 13.0–17.1)
LYMPH%: 11 % — ABNORMAL LOW (ref 14.0–49.0)
MCH: 27.8 pg (ref 27.2–33.4)
MCHC: 35.2 g/dL (ref 32.0–36.0)
MCV: 78.8 fL — AB (ref 79.3–98.0)
MONO#: 2.4 10*3/uL — AB (ref 0.1–0.9)
MONO%: 8.3 % (ref 0.0–14.0)
NEUT#: 22.1 10*3/uL — ABNORMAL HIGH (ref 1.5–6.5)
NEUT%: 78.5 % — ABNORMAL HIGH (ref 39.0–75.0)
PLATELETS: 326 10*3/uL (ref 140–400)
RBC: 4.43 10*6/uL (ref 4.20–5.82)
RDW: 14.4 % (ref 11.0–14.6)
WBC: 28.2 10*3/uL — ABNORMAL HIGH (ref 4.0–10.3)
lymph#: 3.1 10*3/uL (ref 0.9–3.3)

## 2014-07-11 LAB — COMPREHENSIVE METABOLIC PANEL (CC13)
ALBUMIN: 3.8 g/dL (ref 3.5–5.0)
ALT: 32 U/L (ref 0–55)
AST: 21 U/L (ref 5–34)
Alkaline Phosphatase: 246 U/L — ABNORMAL HIGH (ref 40–150)
Anion Gap: 8 mEq/L (ref 3–11)
BILIRUBIN TOTAL: 0.61 mg/dL (ref 0.20–1.20)
BUN: 12 mg/dL (ref 7.0–26.0)
CO2: 25 mEq/L (ref 22–29)
CREATININE: 0.9 mg/dL (ref 0.7–1.3)
Calcium: 9.7 mg/dL (ref 8.4–10.4)
Chloride: 104 mEq/L (ref 98–109)
EGFR: >90 mL/min/{1.73_m2} (ref >90)
Glucose: 111 mg/dl (ref 70–140)
Potassium: 4.3 mEq/L (ref 3.5–5.1)
Sodium: 138 mEq/L (ref 136–145)
Total Protein: 7.9 g/dL (ref 6.4–8.3)

## 2014-07-18 ENCOUNTER — Other Ambulatory Visit (HOSPITAL_BASED_OUTPATIENT_CLINIC_OR_DEPARTMENT_OTHER): Payer: Medicaid Other

## 2014-07-18 DIAGNOSIS — C3492 Malignant neoplasm of unspecified part of left bronchus or lung: Secondary | ICD-10-CM

## 2014-07-18 DIAGNOSIS — C3490 Malignant neoplasm of unspecified part of unspecified bronchus or lung: Secondary | ICD-10-CM

## 2014-07-18 LAB — COMPREHENSIVE METABOLIC PANEL (CC13)
ALT: 19 U/L (ref 0–55)
ANION GAP: 10 meq/L (ref 3–11)
AST: 20 U/L (ref 5–34)
Albumin: 3.7 g/dL (ref 3.5–5.0)
Alkaline Phosphatase: 175 U/L — ABNORMAL HIGH (ref 40–150)
BUN: 7.1 mg/dL (ref 7.0–26.0)
CHLORIDE: 104 meq/L (ref 98–109)
CO2: 25 meq/L (ref 22–29)
Calcium: 9.7 mg/dL (ref 8.4–10.4)
Creatinine: 0.9 mg/dL (ref 0.7–1.3)
EGFR: >90 mL/min/{1.73_m2} (ref >90)
Glucose: 115 mg/dl (ref 70–140)
Potassium: 4.3 mEq/L (ref 3.5–5.1)
SODIUM: 139 meq/L (ref 136–145)
TOTAL PROTEIN: 7.9 g/dL (ref 6.4–8.3)
Total Bilirubin: 0.52 mg/dL (ref 0.20–1.20)

## 2014-07-18 LAB — CBC WITH DIFFERENTIAL/PLATELET
BASO%: 0.6 % (ref 0.0–2.0)
Basophils Absolute: 0.1 10*3/uL (ref 0.0–0.1)
EOS%: 2.5 % (ref 0.0–7.0)
Eosinophils Absolute: 0.4 10*3/uL (ref 0.0–0.5)
HEMATOCRIT: 37.1 % — AB (ref 38.4–49.9)
HGB: 12 g/dL — ABNORMAL LOW (ref 13.0–17.1)
LYMPH%: 16.1 % (ref 14.0–49.0)
MCH: 26.5 pg — ABNORMAL LOW (ref 27.2–33.4)
MCHC: 32.5 g/dL (ref 32.0–36.0)
MCV: 81.5 fL (ref 79.3–98.0)
MONO#: 1.3 10*3/uL — AB (ref 0.1–0.9)
MONO%: 7.7 % (ref 0.0–14.0)
NEUT%: 73.1 % (ref 39.0–75.0)
NEUTROS ABS: 11.9 10*3/uL — AB (ref 1.5–6.5)
PLATELETS: 176 10*3/uL (ref 140–400)
RBC: 4.54 10*6/uL (ref 4.20–5.82)
RDW: 15.2 % — ABNORMAL HIGH (ref 11.0–14.6)
WBC: 16.3 10*3/uL — AB (ref 4.0–10.3)
lymph#: 2.6 10*3/uL (ref 0.9–3.3)

## 2014-07-24 ENCOUNTER — Telehealth: Payer: Self-pay | Admitting: *Deleted

## 2014-07-24 NOTE — Telephone Encounter (Signed)
Patient called asking if the form he left for Charlena Cross is ready for pick up.  Ebony's note documentation for 07-05-2014 reads form at registration desk.  Patient "will pick up tomorrow before infusion appointment".

## 2014-07-25 ENCOUNTER — Ambulatory Visit (HOSPITAL_BASED_OUTPATIENT_CLINIC_OR_DEPARTMENT_OTHER): Payer: Medicaid Other

## 2014-07-25 ENCOUNTER — Ambulatory Visit (HOSPITAL_BASED_OUTPATIENT_CLINIC_OR_DEPARTMENT_OTHER): Payer: Medicaid Other | Admitting: Physician Assistant

## 2014-07-25 ENCOUNTER — Encounter: Payer: Self-pay | Admitting: Physician Assistant

## 2014-07-25 ENCOUNTER — Other Ambulatory Visit (HOSPITAL_BASED_OUTPATIENT_CLINIC_OR_DEPARTMENT_OTHER): Payer: Medicaid Other

## 2014-07-25 ENCOUNTER — Ambulatory Visit: Payer: Medicaid Other

## 2014-07-25 ENCOUNTER — Telehealth: Payer: Self-pay | Admitting: Internal Medicine

## 2014-07-25 ENCOUNTER — Telehealth: Payer: Self-pay | Admitting: *Deleted

## 2014-07-25 VITALS — BP 140/81 | HR 98 | Temp 98.5°F | Resp 18 | Ht 74.0 in | Wt 198.0 lb

## 2014-07-25 DIAGNOSIS — Z5111 Encounter for antineoplastic chemotherapy: Secondary | ICD-10-CM

## 2014-07-25 DIAGNOSIS — C3402 Malignant neoplasm of left main bronchus: Secondary | ICD-10-CM

## 2014-07-25 DIAGNOSIS — C3492 Malignant neoplasm of unspecified part of left bronchus or lung: Secondary | ICD-10-CM

## 2014-07-25 LAB — COMPREHENSIVE METABOLIC PANEL (CC13)
ALK PHOS: 125 U/L (ref 40–150)
ALT: 18 U/L (ref 0–55)
AST: 20 U/L (ref 5–34)
Albumin: 3.6 g/dL (ref 3.5–5.0)
Anion Gap: 11 mEq/L (ref 3–11)
BUN: 10.5 mg/dL (ref 7.0–26.0)
CHLORIDE: 106 meq/L (ref 98–109)
CO2: 23 mEq/L (ref 22–29)
Calcium: 9.9 mg/dL (ref 8.4–10.4)
Creatinine: 0.8 mg/dL (ref 0.7–1.3)
Glucose: 101 mg/dl (ref 70–140)
Potassium: 4.1 mEq/L (ref 3.5–5.1)
SODIUM: 139 meq/L (ref 136–145)
TOTAL PROTEIN: 7.7 g/dL (ref 6.4–8.3)
Total Bilirubin: 0.45 mg/dL (ref 0.20–1.20)

## 2014-07-25 LAB — CBC WITH DIFFERENTIAL/PLATELET
BASO%: 1.4 % (ref 0.0–2.0)
Basophils Absolute: 0.1 10*3/uL (ref 0.0–0.1)
EOS%: 2.3 % (ref 0.0–7.0)
Eosinophils Absolute: 0.2 10*3/uL (ref 0.0–0.5)
HCT: 36 % — ABNORMAL LOW (ref 38.4–49.9)
HGB: 12 g/dL — ABNORMAL LOW (ref 13.0–17.1)
LYMPH%: 25.9 % (ref 14.0–49.0)
MCH: 26.8 pg — AB (ref 27.2–33.4)
MCHC: 33.3 g/dL (ref 32.0–36.0)
MCV: 80.5 fL (ref 79.3–98.0)
MONO#: 1.2 10*3/uL — AB (ref 0.1–0.9)
MONO%: 13.5 % (ref 0.0–14.0)
NEUT#: 5.1 10*3/uL (ref 1.5–6.5)
NEUT%: 56.9 % (ref 39.0–75.0)
PLATELETS: 211 10*3/uL (ref 140–400)
RBC: 4.47 10*6/uL (ref 4.20–5.82)
RDW: 15.1 % — ABNORMAL HIGH (ref 11.0–14.6)
WBC: 8.9 10*3/uL (ref 4.0–10.3)
lymph#: 2.3 10*3/uL (ref 0.9–3.3)

## 2014-07-25 MED ORDER — DIPHENHYDRAMINE HCL 50 MG/ML IJ SOLN
50.0000 mg | Freq: Once | INTRAMUSCULAR | Status: AC
  Administered 2014-07-25: 50 mg via INTRAVENOUS

## 2014-07-25 MED ORDER — FAMOTIDINE IN NACL 20-0.9 MG/50ML-% IV SOLN
INTRAVENOUS | Status: AC
  Filled 2014-07-25: qty 50

## 2014-07-25 MED ORDER — FAMOTIDINE IN NACL 20-0.9 MG/50ML-% IV SOLN
20.0000 mg | Freq: Once | INTRAVENOUS | Status: AC
  Administered 2014-07-25: 20 mg via INTRAVENOUS

## 2014-07-25 MED ORDER — SODIUM CHLORIDE 0.9 % IV SOLN
700.0000 mg | Freq: Once | INTRAVENOUS | Status: AC
  Administered 2014-07-25: 700 mg via INTRAVENOUS
  Filled 2014-07-25: qty 70

## 2014-07-25 MED ORDER — DEXAMETHASONE SODIUM PHOSPHATE 20 MG/5ML IJ SOLN
INTRAMUSCULAR | Status: AC
  Filled 2014-07-25: qty 5

## 2014-07-25 MED ORDER — ONDANSETRON 16 MG/50ML IVPB (CHCC)
INTRAVENOUS | Status: AC
  Filled 2014-07-25: qty 16

## 2014-07-25 MED ORDER — SODIUM CHLORIDE 0.9 % IV SOLN
Freq: Once | INTRAVENOUS | Status: AC
  Administered 2014-07-25: 12:00:00 via INTRAVENOUS

## 2014-07-25 MED ORDER — ONDANSETRON 16 MG/50ML IVPB (CHCC)
16.0000 mg | Freq: Once | INTRAVENOUS | Status: AC
  Administered 2014-07-25: 16 mg via INTRAVENOUS

## 2014-07-25 MED ORDER — DEXAMETHASONE SODIUM PHOSPHATE 20 MG/5ML IJ SOLN
20.0000 mg | Freq: Once | INTRAMUSCULAR | Status: AC
  Administered 2014-07-25: 20 mg via INTRAVENOUS

## 2014-07-25 MED ORDER — DEXTROSE 5 % IV SOLN
175.0000 mg/m2 | Freq: Once | INTRAVENOUS | Status: AC
  Administered 2014-07-25: 384 mg via INTRAVENOUS
  Filled 2014-07-25: qty 64

## 2014-07-25 MED ORDER — DIPHENHYDRAMINE HCL 50 MG/ML IJ SOLN
INTRAMUSCULAR | Status: AC
  Filled 2014-07-25: qty 1

## 2014-07-25 NOTE — Progress Notes (Addendum)
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  PRIOR THERAPY: None  CURRENT THERAPY: Neoadjuvant systemic chemotherapy with carboplatin for AUC of 5 and paclitaxel 175 MG/M2 every 3 weeks with Neulasta support. First dose 06/13/2014. Status post 2 cycles.  INTERVAL HISTORY: Larry [name] 64 y.o. male returns to the clinic today for follow. The patient is feeling fine today with no specific complaints. He is tolerating his neoadjuvant systemic chemotherapy with carboplatin and paclitaxel fairly well. The patient denied having any significant nausea or vomiting. The patient denied having any significant chest pain but continues to have shortness breath with exertion with no cough or hemoptysis. He denied having any significant weight loss or night sweats. He has no fever or chills. He notes some episodes of itching without documented rash.  He is here today to start cycle #3 of his chemotherapy.  MEDICAL HISTORY: Past Medical History  Diagnosis Date  . Hyperlipidemia   . COPD (chronic obstructive pulmonary disease)   . Arthritis     ALLERGIES:  has No Known Allergies.  MEDICATIONS:  Current Outpatient Prescriptions  Medication Sig Dispense Refill  . atorvastatin (LIPITOR) 80 MG tablet Take 80 mg by mouth daily.    . budesonide-formoterol (SYMBICORT) 160-4.5 MCG/ACT inhaler Inhale 2 puffs into the lungs 2 (two) times daily. 1 Inhaler 11  . Omega-3 Fatty Acids (FISH OIL PO) Take 1 tablet by mouth 2 (two) times daily.     Marland Kitchen HYDROcodone-acetaminophen (NORCO/VICODIN) 5-325 MG per tablet Take 1 tablet by mouth every 6 (six) hours as needed.  (Patient not taking: Reported on 07/25/2014) 15 tablet 0  . ondansetron (ZOFRAN ODT) 8 MG disintegrating tablet Take 1 tablet (8 mg total) by mouth every 8 (eight) hours as needed for nausea. (Patient not taking: Reported on 07/25/2014) 20 tablet 0  . prochlorperazine (COMPAZINE) 10 MG tablet Take 1 tablet (10 mg total) by mouth every 6 (six) hours as needed for nausea or vomiting. (Patient not taking: Reported on 07/25/2014) 30 tablet 1   No current facility-administered medications for this visit.    SURGICAL HISTORY:  Past Surgical History  Procedure Laterality Date  . Colonoscopy    . Endobronchial ultrasound Bilateral 04/08/2014    Procedure: ENDOBRONCHIAL ULTRASOUND;  Surgeon: Kathee Delton, MD;  [facility];  Service: Cardiopulmonary;  Laterality: Bilateral;  . Video bronchoscopy with endobronchial navigation Right 04/24/2014    Procedure: VIDEO BRONCHOSCOPY WITH ENDOBRONCHIAL NAVIGATION;  Surgeon: Collene Gobble, MD;  Location: [place];  Service: Thoracic;  Laterality: Right;  . Video bronchoscopy with endobronchial ultrasound Right 04/24/2014    Procedure: VIDEO BRONCHOSCOPY WITH ENDOBRONCHIAL ULTRASOUND;  Surgeon: Collene Gobble, MD;  Location: [place];  Service: Thoracic;  Laterality: Right;    REVIEW OF SYSTEMS:  Constitutional: positive for fatigue Eyes: negative Ears, nose, mouth, throat, and face: negative Respiratory: positive for dyspnea on exertion Cardiovascular: negative Gastrointestinal: negative Genitourinary:negative Integument/breast: negative Hematologic/lymphatic: negative Musculoskeletal:negative Neurological: negative Behavioral/Psych: negative Endocrine: negative Allergic/Immunologic: negative   PHYSICAL EXAMINATION: General appearance: alert, cooperative, fatigued and no distress Head: Normocephalic, without obvious abnormality, atraumatic Neck: no adenopathy, no JVD, supple, symmetrical, trachea midline and thyroid not enlarged, symmetric, no  tenderness/mass/nodules  Lymph nodes: Cervical, supraclavicular, and axillary nodes normal. Resp: clear to auscultation bilaterally Back: symmetric, no curvature. ROM normal. No CVA tenderness. Cardio: regular rate and rhythm, S1, S2 normal, no murmur, click, rub or gallop GI: soft, non-tender; bowel sounds normal; no masses,  no organomegaly Extremities: extremities normal, atraumatic, no cyanosis or edema Neurologic: Alert and oriented X 3, normal strength and tone. Normal symmetric reflexes. Normal coordination and gait Skin: no skin eruptions/lesions noted  ECOG PERFORMANCE STATUS: 1 - Symptomatic but completely ambulatory  Blood pressure 140/81, pulse 98, temperature 98.5 F (36.9 C), temperature source Oral, resp. rate 18, height 6\' 2"  (1.88 m), weight 198 lb (89.812 kg), SpO2 99 %.  LABORATORY DATA: Lab Results  Component Value Date   WBC 8.9 07/25/2014   HGB 12.0* 07/25/2014   HCT 36.0* 07/25/2014   MCV 80.5 07/25/2014   PLT 211 07/25/2014      Chemistry      Component Value Date/Time   NA 139 07/25/2014 1026   NA 139 06/24/2014 2152   K 4.1 07/25/2014 1026   K 4.2 06/24/2014 2152   CL 103 06/24/2014 2152   CO2 23 07/25/2014 1026   CO2 24 06/24/2014 2152   BUN 10.5 07/25/2014 1026   BUN 8 06/24/2014 2152   CREATININE 0.8 07/25/2014 1026   CREATININE 0.87 06/24/2014 2152      Component Value Date/Time   CALCIUM 9.9 07/25/2014 1026   CALCIUM 9.3 06/24/2014 2152   ALKPHOS 125 07/25/2014 1026   ALKPHOS 127* 06/24/2014 2152   AST 20 07/25/2014 1026   AST 25 06/24/2014 2152   ALT 18 07/25/2014 1026   ALT 29 06/24/2014 2152   BILITOT 0.45 07/25/2014 1026   BILITOT 0.4 06/24/2014 2152       RADIOGRAPHIC STUDIES: No results found.  ASSESSMENT AND PLAN: This is a very pleasant 64 years old African-American male with potentially resectable stage IIA non-small cell lung cancer, squamous cell carcinoma. He is currently undergoing neoadjuvant systemic chemotherapy  was carboplatin and paclitaxel status post 2 cycles and tolerating his treatment fairly well with no significant adverse effects. Patient was discussed with and also seen by Dr. Julien Nordmann. He'll proceed with cycle #3 today as scheduled. Will follow-up in 3 weeks prior to the start of cycle #4. In the interim he will continue with weekly labs as scheduled. recommended for the patient to proceed with cycle #2 today as scheduled.  He was advised to call immediately if he has any concerning symptoms in the interval. The patient voices understanding of current disease status and treatment options and is in agreement with the current care plan.  All questions were answered. The patient knows to call the clinic with any problems, questions or concerns. We can certainly see the patient much sooner if necessary.  Carlton Adam, PA-C 07/25/2014  ADDENDUM: Hematology/Oncology Attending: I had a face to face encounter with the patient. I recommended his care plan. This is a very pleasant 64 years old African-American male with unresectable a stage IIa non-small cell lung cancer who is currently undergoing neoadjuvant systemic chemotherapy was carboplatin and paclitaxel is status post 2 cycles. The patient is doing fine and tolerating his treatment fairly well was no significant adverse effects. I recommended for him to proceed with cycle #3 today as a scheduled. He would come back for follow-up visit in 3 weeks for evaluation after repeating CT scan of the chest for restaging of his disease. If he has a good response, we  will consider referring the patient back to thoracic surgery for consideration of surgical resection. The patient was advised to call immediately if he has any concerning symptoms in the interval.  Disclaimer: This note was dictated with voice recognition software. Similar sounding words can inadvertently be transcribed and may not be corrected upon review. Eilleen Kempf.,  MD 07/31/2014

## 2014-07-25 NOTE — Telephone Encounter (Signed)
Per staff message and POF I have scheduled appts. Advised scheduler of appts. JMW  

## 2014-07-25 NOTE — Patient Instructions (Signed)
[facility] Discharge Instructions for Patients Receiving Chemotherapy  Today you received the following chemotherapy agents Paclitaxel/Carboplatin.   To help prevent nausea and vomiting after your treatment, we encourage you to take your nausea medication as directed.    If you develop nausea and vomiting that is not controlled by your nausea medication, call the clinic.   BELOW ARE SYMPTOMS THAT SHOULD BE REPORTED IMMEDIATELY:  *FEVER GREATER THAN 100.5 F  *CHILLS WITH OR WITHOUT FEVER  NAUSEA AND VOMITING THAT IS NOT CONTROLLED WITH YOUR NAUSEA MEDICATION  *UNUSUAL SHORTNESS OF BREATH  *UNUSUAL BRUISING OR BLEEDING  TENDERNESS IN MOUTH AND THROAT WITH OR WITHOUT PRESENCE OF ULCERS  *URINARY PROBLEMS  *BOWEL PROBLEMS  UNUSUAL RASH Items with * indicate a potential emergency and should be followed up as soon as possible.  Feel free to call the clinic you have any questions or concerns. The clinic phone number is [phone].    

## 2014-07-25 NOTE — Telephone Encounter (Signed)
Pt confirmed labs/ov per 02/25 POF, gave pt AVS.... KJ

## 2014-07-26 ENCOUNTER — Ambulatory Visit (HOSPITAL_BASED_OUTPATIENT_CLINIC_OR_DEPARTMENT_OTHER): Payer: Medicaid Other

## 2014-07-26 DIAGNOSIS — C3402 Malignant neoplasm of left main bronchus: Secondary | ICD-10-CM

## 2014-07-26 DIAGNOSIS — C3492 Malignant neoplasm of unspecified part of left bronchus or lung: Secondary | ICD-10-CM

## 2014-07-26 DIAGNOSIS — Z5189 Encounter for other specified aftercare: Secondary | ICD-10-CM

## 2014-07-26 MED ORDER — PEGFILGRASTIM INJECTION 6 MG/0.6ML ~~LOC~~
6.0000 mg | PREFILLED_SYRINGE | Freq: Once | SUBCUTANEOUS | Status: AC
  Administered 2014-07-26: 6 mg via SUBCUTANEOUS

## 2014-07-26 NOTE — Patient Instructions (Signed)
Pegfilgrastim injection What is this medicine? PEGFILGRASTIM (peg fil GRA stim) is a long-acting granulocyte colony-stimulating factor that stimulates the growth of neutrophils, a type of white blood cell important in the body's fight against infection. It is used to reduce the incidence of fever and infection in patients with certain types of cancer who are receiving chemotherapy that affects the bone marrow. This medicine may be used for other purposes; ask your health care provider or pharmacist if you have questions. COMMON BRAND NAME(S): Neulasta What should I tell my health care provider before I take this medicine? They need to know if you have any of these conditions: -latex allergy -ongoing radiation therapy -sickle cell disease -skin reactions to acrylic adhesives (On-Body Injector only) -an unusual or allergic reaction to pegfilgrastim, filgrastim, other medicines, foods, dyes, or preservatives -pregnant or trying to get pregnant -breast-feeding How should I use this medicine? This medicine is for injection under the skin. If you get this medicine at home, you will be taught how to prepare and give the pre-filled syringe or how to use the On-body Injector. Refer to the patient Instructions for Use for detailed instructions. Use exactly as directed. Take your medicine at regular intervals. Do not take your medicine more often than directed. It is important that you put your used needles and syringes in a special sharps container. Do not put them in a trash can. If you do not have a sharps container, call your pharmacist or healthcare provider to get one. Talk to your pediatrician regarding the use of this medicine in children. Special care may be needed. Overdosage: If you think you have taken too much of this medicine contact a poison control center or emergency room at once. NOTE: This medicine is only for you. Do not share this medicine with others. What if I miss a dose? It is  important not to miss your dose. Call your doctor or health care professional if you miss your dose. If you miss a dose due to an On-body Injector failure or leakage, a new dose should be administered as soon as possible using a single prefilled syringe for manual use. What may interact with this medicine? Interactions have not been studied. Give your health care provider a list of all the medicines, herbs, non-prescription drugs, or dietary supplements you use. Also tell them if you smoke, drink alcohol, or use illegal drugs. Some items may interact with your medicine. This list may not describe all possible interactions. Give your health care provider a list of all the medicines, herbs, non-prescription drugs, or dietary supplements you use. Also tell them if you smoke, drink alcohol, or use illegal drugs. Some items may interact with your medicine. What should I watch for while using this medicine? You may need blood work done while you are taking this medicine. If you are going to need a MRI, CT scan, or other procedure, tell your doctor that you are using this medicine (On-Body Injector only). What side effects may I notice from receiving this medicine? Side effects that you should report to your doctor or health care professional as soon as possible: -allergic reactions like skin rash, itching or hives, swelling of the face, lips, or tongue -dizziness -fever -pain, redness, or irritation at site where injected -pinpoint red spots on the skin -shortness of breath or breathing problems -stomach or side pain, or pain at the shoulder -swelling -tiredness -trouble passing urine Side effects that usually do not require medical attention (report to your doctor   or health care professional if they continue or are bothersome): -bone pain -muscle pain This list may not describe all possible side effects. Call your doctor for medical advice about side effects. You may report side effects to FDA at  1-800-FDA-1088. Where should I keep my medicine? Keep out of the reach of children. Store pre-filled syringes in a refrigerator between 2 and 8 degrees C (36 and 46 degrees F). Do not freeze. Keep in carton to protect from light. Throw away this medicine if it is left out of the refrigerator for more than 48 hours. Throw away any unused medicine after the expiration date. NOTE: This sheet is a summary. It may not cover all possible information. If you have questions about this medicine, talk to your doctor, pharmacist, or health care provider.  2015, Elsevier/Gold Standard. (2013-08-16 16:14:05)  

## 2014-07-31 NOTE — Patient Instructions (Signed)
Continue labs and chemotherapy as scheduled Follow up in 3 weeks

## 2014-08-01 ENCOUNTER — Other Ambulatory Visit (HOSPITAL_BASED_OUTPATIENT_CLINIC_OR_DEPARTMENT_OTHER): Payer: Medicaid Other

## 2014-08-01 DIAGNOSIS — C3402 Malignant neoplasm of left main bronchus: Secondary | ICD-10-CM

## 2014-08-01 DIAGNOSIS — C3492 Malignant neoplasm of unspecified part of left bronchus or lung: Secondary | ICD-10-CM

## 2014-08-01 LAB — CBC WITH DIFFERENTIAL/PLATELET
BASO%: 0.3 % (ref 0.0–2.0)
BASOS ABS: 0 10*3/uL (ref 0.0–0.1)
EOS%: 3.1 % (ref 0.0–7.0)
Eosinophils Absolute: 0.5 10*3/uL (ref 0.0–0.5)
HCT: 33.2 % — ABNORMAL LOW (ref 38.4–49.9)
HGB: 11.7 g/dL — ABNORMAL LOW (ref 13.0–17.1)
LYMPH#: 2.3 10*3/uL (ref 0.9–3.3)
LYMPH%: 14.6 % (ref 14.0–49.0)
MCH: 27.8 pg (ref 27.2–33.4)
MCHC: 35.2 g/dL (ref 32.0–36.0)
MCV: 78.9 fL — AB (ref 79.3–98.0)
MONO#: 1.4 10*3/uL — ABNORMAL HIGH (ref 0.1–0.9)
MONO%: 8.7 % (ref 0.0–14.0)
NEUT#: 11.5 10*3/uL — ABNORMAL HIGH (ref 1.5–6.5)
NEUT%: 73.3 % (ref 39.0–75.0)
Platelets: 209 10*3/uL (ref 140–400)
RBC: 4.21 10*6/uL (ref 4.20–5.82)
RDW: 14.9 % — AB (ref 11.0–14.6)
WBC: 15.7 10*3/uL — ABNORMAL HIGH (ref 4.0–10.3)

## 2014-08-01 LAB — COMPREHENSIVE METABOLIC PANEL (CC13)
ALK PHOS: 159 U/L — AB (ref 40–150)
ALT: 12 U/L (ref 0–55)
AST: 15 U/L (ref 5–34)
Albumin: 3.8 g/dL (ref 3.5–5.0)
Anion Gap: 11 mEq/L (ref 3–11)
BILIRUBIN TOTAL: 0.42 mg/dL (ref 0.20–1.20)
BUN: 8.4 mg/dL (ref 7.0–26.0)
CHLORIDE: 103 meq/L (ref 98–109)
CO2: 26 meq/L (ref 22–29)
Calcium: 9.8 mg/dL (ref 8.4–10.4)
Creatinine: 0.8 mg/dL (ref 0.7–1.3)
EGFR: >90 mL/min/{1.73_m2} (ref >90)
Glucose: 97 mg/dl (ref 70–140)
Potassium: 4.1 mEq/L (ref 3.5–5.1)
Sodium: 139 mEq/L (ref 136–145)
TOTAL PROTEIN: 7.6 g/dL (ref 6.4–8.3)

## 2014-08-08 ENCOUNTER — Other Ambulatory Visit: Payer: Self-pay | Admitting: Nurse Practitioner

## 2014-08-08 ENCOUNTER — Ambulatory Visit (HOSPITAL_BASED_OUTPATIENT_CLINIC_OR_DEPARTMENT_OTHER): Payer: Medicaid Other | Admitting: Nurse Practitioner

## 2014-08-08 ENCOUNTER — Other Ambulatory Visit (HOSPITAL_BASED_OUTPATIENT_CLINIC_OR_DEPARTMENT_OTHER): Payer: Medicaid Other

## 2014-08-08 DIAGNOSIS — C3490 Malignant neoplasm of unspecified part of unspecified bronchus or lung: Secondary | ICD-10-CM

## 2014-08-08 DIAGNOSIS — L509 Urticaria, unspecified: Secondary | ICD-10-CM | POA: Diagnosis present

## 2014-08-08 DIAGNOSIS — C3402 Malignant neoplasm of left main bronchus: Secondary | ICD-10-CM

## 2014-08-08 DIAGNOSIS — C3492 Malignant neoplasm of unspecified part of left bronchus or lung: Secondary | ICD-10-CM

## 2014-08-08 LAB — COMPREHENSIVE METABOLIC PANEL (CC13)
ALBUMIN: 3.7 g/dL (ref 3.5–5.0)
ALT: 14 U/L (ref 0–55)
AST: 18 U/L (ref 5–34)
Alkaline Phosphatase: 131 U/L (ref 40–150)
Anion Gap: 12 mEq/L — ABNORMAL HIGH (ref 3–11)
BUN: 8.6 mg/dL (ref 7.0–26.0)
CHLORIDE: 103 meq/L (ref 98–109)
CO2: 24 mEq/L (ref 22–29)
Calcium: 9.6 mg/dL (ref 8.4–10.4)
Creatinine: 0.9 mg/dL (ref 0.7–1.3)
EGFR: >90 mL/min/{1.73_m2} (ref >90)
Glucose: 140 mg/dl (ref 70–140)
Potassium: 4.5 mEq/L (ref 3.5–5.1)
Sodium: 139 mEq/L (ref 136–145)
TOTAL PROTEIN: 7.8 g/dL (ref 6.4–8.3)
Total Bilirubin: 0.36 mg/dL (ref 0.20–1.20)

## 2014-08-08 LAB — CBC WITH DIFFERENTIAL/PLATELET
BASO%: 0.3 % (ref 0.0–2.0)
Basophils Absolute: 0 10*3/uL (ref 0.0–0.1)
EOS%: 2.5 % (ref 0.0–7.0)
Eosinophils Absolute: 0.3 10*3/uL (ref 0.0–0.5)
HCT: 34.3 % — ABNORMAL LOW (ref 38.4–49.9)
HEMOGLOBIN: 12 g/dL — AB (ref 13.0–17.1)
LYMPH%: 15.1 % (ref 14.0–49.0)
MCH: 27.9 pg (ref 27.2–33.4)
MCHC: 35 g/dL (ref 32.0–36.0)
MCV: 79.8 fL (ref 79.3–98.0)
MONO#: 0.8 10*3/uL (ref 0.1–0.9)
MONO%: 5.7 % (ref 0.0–14.0)
NEUT%: 76.4 % — ABNORMAL HIGH (ref 39.0–75.0)
NEUTROS ABS: 10 10*3/uL — AB (ref 1.5–6.5)
Platelets: 220 10*3/uL (ref 140–400)
RBC: 4.3 10*6/uL (ref 4.20–5.82)
RDW: 15.4 % — AB (ref 11.0–14.6)
WBC: 13.1 10*3/uL — ABNORMAL HIGH (ref 4.0–10.3)
lymph#: 2 10*3/uL (ref 0.9–3.3)

## 2014-08-08 MED ORDER — METHYLPREDNISOLONE (PAK) 4 MG PO TABS: ORAL_TABLET | ORAL | Status: DC

## 2014-08-09 ENCOUNTER — Encounter: Payer: Self-pay | Admitting: Nurse Practitioner

## 2014-08-09 DIAGNOSIS — L509 Urticaria, unspecified: Secondary | ICD-10-CM | POA: Insufficient documentation

## 2014-08-09 NOTE — Assessment & Plan Note (Signed)
Patient received cycle 3 of his carboplatin/Taxol chemotherapy regimen on 07/25/2014.  He is scheduled for a restaging CT on 08/12/2014.  He will return on 08/15/2014 for labs, follow up visit to review scan results, and his next cycle of chemotherapy.

## 2014-08-09 NOTE — Assessment & Plan Note (Signed)
Patient last received carboplatin/Taxol chemotherapy on 07/25/2014; and received his Neulasta injection on 07/26/2014.  He denies any other new medications, soaps/shampoos, new foods, etc.  Patient states that he has now developed hives to his generalized body; he is complaining of pruritus.  He has not tried any over-the-counter medications to treat the rash.  On exam-patient does have scattered hives to bilateral arms, back and chest, and to his legs.  Patient continues managing his secretions and airway with no difficulty.  Patient has received a total of 3 cycles of his chemotherapy so far; and did mention some mild rash/itching on occasion only with his prior chemotherapies.  Development of hives could very well be secondary to chemotherapy; but could also be a result of another irritant as well.  Patient was advised to try Benadryl 25 mg every 6 hours and Pepcid 20 mg every 12 hours.  He was also given a prescription for Medrol Dosepak to try as well.  Patient was advised to call/return ago directly to the emergency department if he develops any worsening symptoms whatsoever.

## 2014-08-09 NOTE — Progress Notes (Signed)
SYMPTOM MANAGEMENT CLINIC   HPI: Larry [name] 64 y.o. male diagnosed with lung cancer.  Currently undergoing neoadjuvant carboplatin/Taxol chemotherapy regimen.  Patient presented as a walk-in to the [facility] today with complaint of recent development of itching and hives.  Patient denies any difficulty managing secretions or airway problems.  He has tried no over-the-counter medications to clear the rash.  He denies any other new symptoms.  He also denies any recent fevers or chills.   HPI  ROS  Past Medical History  Diagnosis Date  . Hyperlipidemia   . COPD (chronic obstructive pulmonary disease)   . Arthritis     Past Surgical History  Procedure Laterality Date  . Colonoscopy    . Endobronchial ultrasound Bilateral 04/08/2014    Procedure: ENDOBRONCHIAL ULTRASOUND;  Surgeon: Kathee Delton, MD;  Location: [facility];  Service: Cardiopulmonary;  Laterality: Bilateral;  . Video bronchoscopy with endobronchial navigation Right 04/24/2014    Procedure: VIDEO BRONCHOSCOPY WITH ENDOBRONCHIAL NAVIGATION;  Surgeon: Collene Gobble, MD;  Location: [facility];  Service: Thoracic;  Laterality: Right;  . Video bronchoscopy with endobronchial ultrasound Right 04/24/2014    Procedure: VIDEO BRONCHOSCOPY WITH ENDOBRONCHIAL ULTRASOUND;  Surgeon: Collene Gobble, MD;  Location: [facility];  Service: Thoracic;  Laterality: Right;    has BLOOD IN STOOL, OCCULT; Hilar adenopathy, left; COPD mixed type; Tobacco abuse; Lung nodules; Squamous cell carcinoma of lung, stage II; and Hives on his problem list.    has No Known Allergies.    Medication List       This list is accurate as of: 08/08/14 11:59 PM.  Always use your most recent med list.               atorvastatin 80 MG tablet  Commonly known as:  LIPITOR  Take 80 mg by mouth daily.     budesonide-formoterol 160-4.5 MCG/ACT inhaler  Commonly known as:  SYMBICORT  Inhale 2 puffs into the lungs 2 (two) times daily.     FISH OIL PO    Take 1 tablet by mouth 2 (two) times daily.     HYDROcodone-acetaminophen 5-325 MG per tablet  Commonly known as:  NORCO/VICODIN  Take 1 tablet by mouth every 6 (six) hours as needed.     methylPREDNIsolone 4 MG tablet  Commonly known as:  MEDROL DOSPACK  follow package directions     ondansetron 8 MG disintegrating tablet  Commonly known as:  ZOFRAN ODT  Take 1 tablet (8 mg total) by mouth every 8 (eight) hours as needed for nausea.     prochlorperazine 10 MG tablet  Commonly known as:  COMPAZINE  Take 1 tablet (10 mg total) by mouth every 6 (six) hours as needed for nausea or vomiting.         PHYSICAL EXAMINATION  Oncology Vitals 07/26/2014 07/25/2014 07/05/2014 07/04/2014 06/25/2014 06/25/2014 06/25/2014  Height - 188 cm - 188 cm - - -  Weight - 89.812 kg - 89.858 kg - - -  Weight (lbs) - 198 lbs - 198 lbs 2 oz - - -  BMI (kg/m2) - 25.42 kg/m2 - 25.43 kg/m2 - - -  Temp 98.3 98.5 98.2 98.4 98.8 - -  Pulse 86 98 71 87 79 91 88  Resp - 18 - _0 SpO2 - 99 - 99 98 97 97  BSA (m2) - 2.17 m2 - 2.17 m2 - - -   BP Readings from Last 3 Encounters:  07/26/14 138/83  07/25/14 140/81  07/05/14 128/75    Physical Exam  Constitutional: He is oriented to person, place, and time.  Patient appears fatigued and chronically ill.  HENT:  Head: Normocephalic and atraumatic.  Mouth/Throat: Oropharynx is clear and moist.  Eyes: Conjunctivae and EOM are normal. Pupils are equal, round, and reactive to light. Right eye exhibits no discharge. Left eye exhibits no discharge. No scleral icterus.  Neck: Normal range of motion. Neck supple. No JVD present. No tracheal deviation present. No thyromegaly present.  Cardiovascular: Normal rate, regular rhythm, normal heart sounds and intact distal pulses.   Pulmonary/Chest: Effort normal and breath sounds normal. No stridor. No respiratory distress. He has no wheezes. He has no rales. He exhibits no tenderness.  Abdominal: Soft. Bowel sounds are  normal. He exhibits no distension and no mass. There is no tenderness. There is no rebound and no guarding.  Musculoskeletal: Normal range of motion. He exhibits no edema or tenderness.  Lymphadenopathy:    He has no cervical adenopathy.  Neurological: He is alert and oriented to person, place, and time. Gait normal.  Skin: Skin is warm and dry. Rash noted. No erythema.  Patient has scattered hives to his bilateral upper and lower extremities; as well as his chest and his back.  Psychiatric: Affect normal.    LABORATORY DATA:. Appointment on 08/08/2014  Component Date Value Ref Range Status  . WBC 08/08/2014 13.1* 4.0 - 10.3 10e3/uL Final  . NEUT# 08/08/2014 10.0* 1.5 - 6.5 10e3/uL Final  . HGB 08/08/2014 12.0* 13.0 - 17.1 g/dL Final  . HCT 08/08/2014 34.3* 38.4 - 49.9 % Final  . Platelets 08/08/2014 220  140 - 400 10e3/uL Final  . MCV 08/08/2014 79.8  79.3 - 98.0 fL Final  . MCH 08/08/2014 27.9  27.2 - 33.4 pg Final  . MCHC 08/08/2014 35.0  32.0 - 36.0 g/dL Final  . RBC 08/08/2014 4.30  4.20 - 5.82 10e6/uL Final  . RDW 08/08/2014 15.4* 11.0 - 14.6 % Final  . lymph# 08/08/2014 2.0  0.9 - 3.3 10e3/uL Final  . MONO# 08/08/2014 0.8  0.1 - 0.9 10e3/uL Final  . Eosinophils Absolute 08/08/2014 0.3  0.0 - 0.5 10e3/uL Final  . Basophils Absolute 08/08/2014 0.0  0.0 - 0.1 10e3/uL Final  . NEUT% 08/08/2014 76.4* 39.0 - 75.0 % Final  . LYMPH% 08/08/2014 15.1  14.0 - 49.0 % Final  . MONO% 08/08/2014 5.7  0.0 - 14.0 % Final  . EOS% 08/08/2014 2.5  0.0 - 7.0 % Final  . BASO% 08/08/2014 0.3  0.0 - 2.0 % Final  . Sodium 08/08/2014 139  136 - 145 mEq/L Final  . Potassium 08/08/2014 4.5  3.5 - 5.1 mEq/L Final  . Chloride 08/08/2014 103  98 - 109 mEq/L Final  . CO2 08/08/2014 24  22 - 29 mEq/L Final  . Glucose 08/08/2014 140  70 - 140 mg/dl Final  . BUN 08/08/2014 8.6  7.0 - 26.0 mg/dL Final  . Creatinine 08/08/2014 0.9  0.7 - 1.3 mg/dL Final  . Total Bilirubin 08/08/2014 0.36  0.20 - 1.20 mg/dL  Final  . Alkaline Phosphatase 08/08/2014 131  40 - 150 U/L Final  . AST 08/08/2014 18  5 - 34 U/L Final  . ALT 08/08/2014 14  0 - 55 U/L Final  . Total Protein 08/08/2014 7.8  6.4 - 8.3 g/dL Final  . Albumin 08/08/2014 3.7  3.5 - 5.0 g/dL Final  . Calcium 08/08/2014 9.6  8.4 - 10.4 mg/dL  Final  . Anion Gap 08/08/2014 12* 3 - 11 mEq/L Final  . EGFR 08/08/2014 >90  >90 ml/min/1.73 m2 Final   eGFR is calculated using the CKD-EPI Creatinine Equation (2009)     RADIOGRAPHIC STUDIES: No results found.  ASSESSMENT/PLAN:    Hives Patient last received carboplatin/Taxol chemotherapy on 07/25/2014; and received his Neulasta injection on 07/26/2014.  He denies any other new medications, soaps/shampoos, new foods, etc.  Patient states that he has now developed hives to his generalized body; he is complaining of pruritus.  He has not tried any over-the-counter medications to treat the rash.  On exam-patient does have scattered hives to bilateral arms, back and chest, and to his legs.  Patient continues managing his secretions and airway with no difficulty.  Patient has received a total of 3 cycles of his chemotherapy so far; and did mention some mild rash/itching on occasion only with his prior chemotherapies.  Development of hives could very well be secondary to chemotherapy; but could also be a result of another irritant as well.  Patient was advised to try Benadryl 25 mg every 6 hours and Pepcid 20 mg every 12 hours.  He was also given a prescription for Medrol Dosepak to try as well.  Patient was advised to call/return ago directly to the emergency department if he develops any worsening symptoms whatsoever.     Squamous cell carcinoma of lung, stage II Patient received cycle 3 of his carboplatin/Taxol chemotherapy regimen on 07/25/2014.  He is scheduled for a restaging CT on 08/12/2014.  He will return on 08/15/2014 for labs, follow up visit to review scan results, and his next cycle of  chemotherapy.   Patient stated understanding of all instructions; and was in agreement with this plan of care. The patient knows to call the clinic with any problems, questions or concerns.   Review/collaboration with Dr. Julien Nordmann regarding all aspects of patient's visit today.   Total time spent with patient was 25 minutes;  with greater than 75 percent of that time spent in face to face counseling regarding patient's symptoms,  and coordination of care and follow up.  Disclaimer: This note was dictated with voice recognition software. Similar sounding words can inadvertently be transcribed and may not be corrected upon review.   Drue Second, NP 08/09/2014

## 2014-08-12 ENCOUNTER — Ambulatory Visit (HOSPITAL_COMMUNITY)
Admission: RE | Admit: 2014-08-12 | Discharge: 2014-08-12 | Disposition: A | Discharge status: Home / Self Care | Payer: Medicaid Other | Source: Ambulatory Visit | Attending: Physician Assistant | Admitting: Physician Assistant

## 2014-08-12 ENCOUNTER — Encounter (HOSPITAL_COMMUNITY): Payer: Self-pay

## 2014-08-12 DIAGNOSIS — Z79899 Other long term (current) drug therapy: Secondary | ICD-10-CM | POA: Insufficient documentation

## 2014-08-12 DIAGNOSIS — C3492 Malignant neoplasm of unspecified part of left bronchus or lung: Secondary | ICD-10-CM | POA: Insufficient documentation

## 2014-08-12 DIAGNOSIS — Z720 Tobacco use: Secondary | ICD-10-CM | POA: Insufficient documentation

## 2014-08-12 DIAGNOSIS — R0602 Shortness of breath: Secondary | ICD-10-CM | POA: Diagnosis not present

## 2014-08-12 MED ORDER — IOHEXOL 300 MG/ML  SOLN
100.0000 mL | Freq: Once | INTRAMUSCULAR | Status: AC | PRN
  Administered 2014-08-12: 100 mL via INTRAVENOUS

## 2014-08-13 ENCOUNTER — Telehealth: Payer: Self-pay

## 2014-08-13 NOTE — Telephone Encounter (Signed)
S/w pt the hives and itching have cleared up "a whole lot". He will f/u on 3/17 with Ned Card NP.

## 2014-08-15 ENCOUNTER — Ambulatory Visit (HOSPITAL_BASED_OUTPATIENT_CLINIC_OR_DEPARTMENT_OTHER): Payer: Medicaid Other | Admitting: Nurse Practitioner

## 2014-08-15 ENCOUNTER — Other Ambulatory Visit (HOSPITAL_BASED_OUTPATIENT_CLINIC_OR_DEPARTMENT_OTHER): Payer: Medicaid Other

## 2014-08-15 ENCOUNTER — Ambulatory Visit: Payer: Medicaid Other

## 2014-08-15 VITALS — BP 153/71 | HR 82 | Temp 98.3°F | Resp 19 | Ht 74.0 in | Wt 193.3 lb

## 2014-08-15 DIAGNOSIS — C3402 Malignant neoplasm of left main bronchus: Secondary | ICD-10-CM | POA: Diagnosis not present

## 2014-08-15 DIAGNOSIS — C3490 Malignant neoplasm of unspecified part of unspecified bronchus or lung: Secondary | ICD-10-CM

## 2014-08-15 DIAGNOSIS — C3492 Malignant neoplasm of unspecified part of left bronchus or lung: Secondary | ICD-10-CM

## 2014-08-15 LAB — COMPREHENSIVE METABOLIC PANEL (CC13)
ALT: 30 U/L (ref 0–55)
AST: 19 U/L (ref 5–34)
Albumin: 3.9 g/dL (ref 3.5–5.0)
Alkaline Phosphatase: 114 U/L (ref 40–150)
Anion Gap: 10 mEq/L (ref 3–11)
BILIRUBIN TOTAL: 0.39 mg/dL (ref 0.20–1.20)
BUN: 9.7 mg/dL (ref 7.0–26.0)
CO2: 24 meq/L (ref 22–29)
Calcium: 9.6 mg/dL (ref 8.4–10.4)
Chloride: 103 mEq/L (ref 98–109)
Creatinine: 0.8 mg/dL (ref 0.7–1.3)
EGFR: >90 mL/min/{1.73_m2} (ref >90)
Glucose: 108 mg/dl (ref 70–140)
Potassium: 4 mEq/L (ref 3.5–5.1)
SODIUM: 137 meq/L (ref 136–145)
Total Protein: 8 g/dL (ref 6.4–8.3)

## 2014-08-15 LAB — CBC WITH DIFFERENTIAL/PLATELET
BASO%: 0.8 % (ref 0.0–2.0)
BASOS ABS: 0.1 10*3/uL (ref 0.0–0.1)
EOS%: 1.2 % (ref 0.0–7.0)
Eosinophils Absolute: 0.1 10*3/uL (ref 0.0–0.5)
HCT: 40.1 % (ref 38.4–49.9)
HGB: 13 g/dL (ref 13.0–17.1)
LYMPH%: 23 % (ref 14.0–49.0)
MCH: 27.1 pg — AB (ref 27.2–33.4)
MCHC: 32.5 g/dL (ref 32.0–36.0)
MCV: 83.2 fL (ref 79.3–98.0)
MONO#: 1.5 10*3/uL — AB (ref 0.1–0.9)
MONO%: 13 % (ref 0.0–14.0)
NEUT#: 7.2 10*3/uL — ABNORMAL HIGH (ref 1.5–6.5)
NEUT%: 62 % (ref 39.0–75.0)
Platelets: 242 10*3/uL (ref 140–400)
RBC: 4.82 10*6/uL (ref 4.20–5.82)
RDW: 16 % — ABNORMAL HIGH (ref 11.0–14.6)
WBC: 11.6 10*3/uL — ABNORMAL HIGH (ref 4.0–10.3)
lymph#: 2.7 10*3/uL (ref 0.9–3.3)

## 2014-08-15 NOTE — Progress Notes (Addendum)
  [facility] OFFICE PROGRESS NOTE   Diagnosis:  Stage IIA (T1a, N1, M0) non-small cell lung cancer, squamous cell carcinoma diagnosed in November 2015, presented with left suprahilar soft tissue mass.  PRIOR THERAPY: None  CURRENT THERAPY: Neoadjuvant systemic chemotherapy with carboplatin for AUC of 5 and paclitaxel 175 MG/M2 every 3 weeks with Neulasta support. First dose 06/13/2014. Status post 3 cycles.  INTERVAL HISTORY:   [name] returns as scheduled. He completed cycle 3 carboplatin/Taxol 07/25/2014. He denies nausea/vomiting. No mouth sores. No diarrhea or constipation. No numbness or tingling in his hands or feet. He denies shortness of breath. No cough or fever. He has a good appetite. He denies pain. He developed "hives" about 2 weeks after the most recent chemotherapy. He was seen in the symptom management clinic. He was given instructions to take Benadryl and Pepcid. The hives resolved. He occasionally notes a few similar type lesions  Objective:  Vital signs in last 24 hours:  Blood pressure 153/71, pulse 82, temperature 98.3 F (36.8 C), temperature source Oral, resp. rate 19, height 6\' 2"  (1.88 m), weight 193 lb 4.8 oz (87.68 kg), SpO2 98 %.    HEENT: No thrush or ulcers. Lymphatics: No palpable cervical or supraclavicular lymph nodes. Resp: Lungs clear bilaterally. Cardio: Regular rate and rhythm. GI: Abdomen soft and nontender. No hepatomegaly. Vascular: No leg edema. Calves soft and nontender.  Skin: No rash.    Lab Results:  Lab Results  Component Value Date   WBC 11.6* 08/15/2014   HGB 13.0 08/15/2014   HCT 40.1 08/15/2014   MCV 83.2 08/15/2014   PLT 242 08/15/2014   NEUTROABS 7.2* 08/15/2014    Imaging:  No results found.  Medications: I have reviewed the patient's current medications.  Assessment/Plan: 1. Stage IIA (T1a, N1, M0) non-small cell lung cancer, squamous cell carcinoma diagnosed in November 2015, presented with left  suprahilar soft tissue mass. Initiation of carboplatin/Taxol every 3 weeks beginning 06/13/2014. Cycle 3 completed 07/25/2014. Restaging chest CT 08/12/2014 showed a decrease in the size of the left suprahilar mass and right lower lobe nodule.   Disposition: [name] has completed 3 cycles of carboplatin/Taxol. Chest CT shows improvement. We are referring him back to Dr. Roxan Hockey for consideration of resection. We will schedule a follow-up visit here pending his appointment with Dr. Roxan Hockey.  Patient seen with Dr. Julien Nordmann.    Ned Card ANP/GNP-BC   08/15/2014  10:30 AM  ADDENDUM: Hematology/Oncology Attending: I had a face to face encounter with the patient. I recommended his care plan. This is a very pleasant 64 years old African-American male with a stage II a non-small cell lung cancer who underwent 3 cycles of neoadjuvant chemotherapy was carboplatin and paclitaxel. His recent CT scan of the chest shows significant improvement in his disease. I discussed the scan results with the patient today. I recommended for the patient to see Dr. Roxan Hockey for consideration of surgical resection of his pulmonary functions are appropriate for resection. I will see him back for follow-up visit weeks after his surgical resection but not a good surgical candidate, I would see the patient sooner for reevaluation and consideration of a course of concurrent chemoradiation. He was advised to call immediately if he has any concerning symptoms in the interval.  Disclaimer: This note was dictated with voice recognition software. Similar sounding words can inadvertently be transcribed and may be missed upon review. Eilleen Kempf., MD 08/18/2014

## 2014-08-16 ENCOUNTER — Other Ambulatory Visit: Payer: Self-pay

## 2014-08-16 DIAGNOSIS — R911 Solitary pulmonary nodule: Secondary | ICD-10-CM

## 2014-08-17 ENCOUNTER — Ambulatory Visit: Payer: Medicaid Other

## 2014-08-18 ENCOUNTER — Encounter: Payer: Self-pay | Admitting: Nurse Practitioner

## 2014-08-20 ENCOUNTER — Ambulatory Visit: Payer: Medicaid Other | Admitting: Thoracic Surgery (Cardiothoracic Vascular Surgery)

## 2014-08-20 ENCOUNTER — Ambulatory Visit (HOSPITAL_COMMUNITY)
Admission: RE | Admit: 2014-08-20 | Discharge: 2014-08-20 | Disposition: A | Discharge status: Home / Self Care | Payer: Medicaid Other | Source: Ambulatory Visit | Attending: Thoracic Surgery (Cardiothoracic Vascular Surgery) | Admitting: Thoracic Surgery (Cardiothoracic Vascular Surgery)

## 2014-08-20 DIAGNOSIS — R918 Other nonspecific abnormal finding of lung field: Secondary | ICD-10-CM | POA: Diagnosis not present

## 2014-08-20 DIAGNOSIS — R911 Solitary pulmonary nodule: Secondary | ICD-10-CM

## 2014-08-20 DIAGNOSIS — F17210 Nicotine dependence, cigarettes, uncomplicated: Secondary | ICD-10-CM | POA: Insufficient documentation

## 2014-08-20 DIAGNOSIS — R0609 Other forms of dyspnea: Secondary | ICD-10-CM | POA: Diagnosis not present

## 2014-08-20 LAB — PULMONARY FUNCTION TEST
DL/VA % PRED: 74 %
DL/VA: 3.54 ml/min/mmHg/L
DLCO cor % pred: 56 %
DLCO cor: 20.59 ml/min/mmHg
DLCO unc % pred: 53 %
DLCO unc: 19.6 ml/min/mmHg
FEF 25-75 POST: 0.42 L/s
FEF 25-75 Pre: 0.69 L/sec
FEF2575-%Change-Post: -39 %
FEF2575-%PRED-POST: 13 %
FEF2575-%Pred-Pre: 23 %
FEV1-%Change-Post: -16 %
FEV1-%Pred-Post: 54 %
FEV1-%Pred-Pre: 65 %
FEV1-Post: 1.85 L
FEV1-Pre: 2.22 L
FEV1FVC-%CHANGE-POST: -11 %
FEV1FVC-%Pred-Pre: 75 %
FEV6-%Change-Post: -8 %
FEV6-%PRED-POST: 71 %
FEV6-%Pred-Pre: 78 %
FEV6-PRE: 3.31 L
FEV6-Post: 3.03 L
FEV6FVC-%Change-Post: -2 %
FEV6FVC-%PRED-PRE: 90 %
FEV6FVC-%Pred-Post: 87 %
FVC-%Change-Post: -5 %
FVC-%PRED-POST: 82 %
FVC-%Pred-Pre: 87 %
FVC-POST: 3.61 L
FVC-Pre: 3.84 L
Post FEV1/FVC ratio: 51 %
Post FEV6/FVC ratio: 84 %
Pre FEV1/FVC ratio: 58 %
Pre FEV6/FVC Ratio: 86 %
RV % PRED: 106 %
RV: 2.67 L
TLC % pred: 84 %
TLC: 6.46 L

## 2014-08-20 MED ORDER — ALBUTEROL SULFATE (2.5 MG/3ML) 0.083% IN NEBU
2.5000 mg | INHALATION_SOLUTION | Freq: Once | RESPIRATORY_TRACT | Status: AC
  Administered 2014-08-20: 2.5 mg via RESPIRATORY_TRACT

## 2014-08-23 ENCOUNTER — Telehealth: Payer: Self-pay | Admitting: *Deleted

## 2014-08-23 NOTE — Telephone Encounter (Signed)
Called to check on patient and ask about his smoking cessation.  He stated he is doing well.  We talked about his smoking cessation.  He stated he has not quit.  I asked him what did he think would work for him to quit.  He stated stop buying them.  I stated that is a good place to start.  I stated that he can call me at cancer center if he was having trouble quitting.  He stated he would.  I encourage him to quit.

## 2014-08-28 ENCOUNTER — Ambulatory Visit: Payer: Medicaid Other | Admitting: Thoracic Surgery (Cardiothoracic Vascular Surgery)

## 2014-08-29 ENCOUNTER — Ambulatory Visit (INDEPENDENT_AMBULATORY_CARE_PROVIDER_SITE_OTHER): Payer: Medicaid Other | Admitting: Thoracic Surgery (Cardiothoracic Vascular Surgery)

## 2014-08-29 ENCOUNTER — Encounter: Payer: Self-pay | Admitting: Thoracic Surgery (Cardiothoracic Vascular Surgery)

## 2014-08-29 ENCOUNTER — Other Ambulatory Visit: Payer: Self-pay | Admitting: *Deleted

## 2014-08-29 VITALS — Ht 74.0 in | Wt 195.0 lb

## 2014-08-29 DIAGNOSIS — C3492 Malignant neoplasm of unspecified part of left bronchus or lung: Secondary | ICD-10-CM

## 2014-08-29 DIAGNOSIS — J449 Chronic obstructive pulmonary disease, unspecified: Secondary | ICD-10-CM | POA: Diagnosis not present

## 2014-08-29 DIAGNOSIS — C3412 Malignant neoplasm of upper lobe, left bronchus or lung: Secondary | ICD-10-CM

## 2014-08-29 NOTE — Progress Notes (Signed)
[address]       [place] [address]             [phone]      PCP is Millsaps, Luane School, NP Referring Provider is Curt Bears, MD  Chief Complaint  Patient presents with  . 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  He was diagnosed with this at bronchoscopy after CT of the chest showed a left hilar mass and possible hilar adenopathy. The mass was in close proximity to the main pulmonary artery and it was felt that a pneumonectomy would be required for primary resection. He is not a candidate for that procedure due to his pulmonary function tests.  He then was treated with 3 cycles of carboplatin and Taxol. He tolerated that well for the most part, but did develop hives about 2 weeks after his last cycle of chemotherapy. He had a repeat CT of the chest which showed a decrease in the size of the left hilar mass. There also was a small right lower lobe nodule that decreased in size from 34mm to 3 mm. It is unclear if this represents a second primary.  He says that his weight has been stable. He denies any chest pain, pressure, or tightness. He has not had any unusual headaches or visual changes. He continues to smoke. He has about a 45-pack-year history of smoking. He says he is down to 3-4 cigarettes per day. Of note, this is exactly what he told me his last visit in January.  Zubrod Score: At the time of surgery this patient's most appropriate activity status/level should be described as: []     0    Normal activity, no symptoms [x]     1    Restricted in physical strenuous activity but ambulatory, able to do out light work []     2    Ambulatory and capable of self care, unable to do work activities, up and about >50 % of waking hours                              []     3    Only limited self  care, in bed greater than 50% of waking hours []     4    Completely disabled, no self care, confined to bed or chair []     5    Moribund     Past Medical History  Diagnosis Date  . Hyperlipidemia   . COPD (chronic obstructive pulmonary disease)   . Arthritis   . Non-small cell carcinoma of left lung     Past Surgical History  Procedure Laterality Date  . Colonoscopy    . Endobronchial ultrasound Bilateral 04/08/2014    Procedure: ENDOBRONCHIAL ULTRASOUND;  Surgeon: Kathee Delton, MD;  Location: [facility];  Service: Cardiopulmonary;  Laterality: Bilateral;  . Video bronchoscopy with endobronchial navigation Right 04/24/2014    Procedure: VIDEO BRONCHOSCOPY WITH ENDOBRONCHIAL NAVIGATION;  Surgeon: Collene Gobble, MD;  Location: [facility];  Service: Thoracic;  Laterality: Right;  . Video bronchoscopy with endobronchial ultrasound Right 04/24/2014    Procedure: VIDEO BRONCHOSCOPY WITH ENDOBRONCHIAL ULTRASOUND;  Surgeon: Collene Gobble, MD;  Location: [facility];  Service: Thoracic;  Laterality: Right;    Family  History  Problem Relation Age of Onset  . Heart attack Mother   . Heart attack Father   . Diabetes Mother   . Heart failure Father   . Cancer Brother     Social History History  Substance Use Topics  . Smoking status: Current Some Day Smoker -- 1.00 packs/day for 45 years    Types: Cigarettes  . Smokeless tobacco: Never Used     Comment: 3-4 cigs daily 04/12/14.   Marland Kitchen Alcohol Use: No    Current Outpatient Prescriptions  Medication Sig Dispense Refill  . atorvastatin (LIPITOR) 80 MG tablet Take 80 mg by mouth daily.    . budesonide-formoterol (SYMBICORT) 160-4.5 MCG/ACT inhaler Inhale 2 puffs into the lungs 2 (two) times daily. 1 Inhaler 11  . Omega-3 Fatty Acids (FISH OIL PO) Take 1 tablet by mouth 2 (two) times daily.      No current facility-administered medications for this visit.    No Known Allergies  Review of Systems  Constitutional: Negative for fever,  activity change, appetite change and unexpected weight change.  Respiratory: Positive for cough and shortness of breath. Negative for wheezing.   Cardiovascular: Negative for chest pain.  Neurological: Negative.   Hematological: Negative for adenopathy. Does not bruise/bleed easily.  All other systems reviewed and are negative.   Ht 6\' 2"  (1.88 m)  Wt 195 lb (88.451 kg)  BMI 25.03 kg/m2  SpO2  Physical Exam  Constitutional: He is oriented to person, place, and time. He appears well-developed and well-nourished. No distress.  HENT:  Head: Normocephalic and atraumatic.  Eyes: EOM are normal. Pupils are equal, round, and reactive to light.  Neck: Neck supple. No thyromegaly present.  Cardiovascular: Normal rate, regular rhythm and normal heart sounds.   No murmur heard. Pulmonary/Chest: Effort normal and breath sounds normal. He has no wheezes. He has no rales.  Abdominal: Soft. There is no tenderness.  Musculoskeletal: He exhibits no edema.  Lymphadenopathy:    He has no cervical adenopathy.  Neurological: He is alert and oriented to person, place, and time. No cranial nerve deficit.  Skin: Skin is warm and dry.  Vitals reviewed.    Diagnostic Tests: CT CHEST WITH CONTRAST 08/12/14  TECHNIQUE: Multidetector CT imaging of the chest was performed during intravenous contrast administration.  CONTRAST: 169mL OMNIPAQUE IOHEXOL 300 MG/ML SOLN  COMPARISON: 04/15/2014 chest CT, PET-CT 02/05/2014  FINDINGS: Mediastinum/Nodes: Allowing for poor visualization of the left suprahilar mass separate from the left upper lobe and main pulmonary artery, the mass appears subjectively decreased in size, now 1.5 x 1.3 cm image 30.  Moderate atheromatous aortic and coronary arterial calcifications are reidentified without evidence for aneurysm.  Trace fluid within the superior pericardial recess is identified.  Stable 0.7 cm short axis diameter pretracheal lymph  node reidentified image 26.  Lungs/Pleura: Diffuse emphysematous changes reidentified.  Right lower lobe spiculated nodule has decreased further in size, now 0.4 cm maximally image 32 compared to 0.7 cm previously. Previously questioned endobronchial lesion within the left upper lobe is no longer identified, compatible with previously seen mucous plugs. No new pulmonary nodule, mass, or consolidation is identified.  Upper abdomen: Adrenal glands are unremarkable.  Musculoskeletal: No lytic or sclerotic osseous lesion or acute osseous finding.  IMPRESSION: Decrease in size of left suprahilar squamous cell carcinoma and irregular right lower lobe pulmonary parenchymal nodule, compatible with response to treatment.  No new abnormality within the chest is identified.   Electronically Signed  By: Roena Malady.D.  PULMONARY FUNCTION TESTING   FVC= 2.47(55%) FEV1= 1.43(42%) FEV1/FVC= 58%   I personally reviewed the CT scans, previous PET scan, and pathology. I also reviewed the before and after CT scan with Mr. Donahoe.  Impression: Mr. Goggins is a 64 year old smoker with a squamous cell carcinoma of the left upper lobe. This is clinically a stage IIA lesion (T1, N1). The mass abuts the main pulmonary artery. He was treated with 3 cycles of cis-platinum and Taxol for neoadjuvant therapy. He has had a good initial response, albeit a partial one.  I reviewed the films with Mr. Brassfield. We discussed the options going forward. Those include an attempt at surgical resection versus continue with chemotherapy with or without radiation. I discussed the relative advantages and disadvantages to each of those approaches. He understands that a complete surgical resection gives him the best chance for a long-term cure. He also understands that there is no guarantee of cure.  I do think he could tolerate a lobectomy. It still is not entirely clear from the CT that a lobectomy is a  possibility given the proximity to the pulmonary artery. Certainly the dissection of the upper lobe branches may be difficult. I do think that given his good response it is worth considering. He is a moderate risk patient for a lobectomy. Again I do not think a pneumonectomy is an option.  I discussed the indications, risks, benefits, and alternatives with Mr. Brine. I described the general nature of the operation, including the need for general anesthesia, the incisions to be used, the expected hospital stay, and the overall recovery. He understands the risks include, but are not limited to death, MI, DVT, PE, stroke, bleeding, possible need for transfusion, infection, prolonged air leak, cardiac arrhythmias, as well as the possibility of unforeseeable complications.  He wishes to proceed with surgical resection.  The importance of smoking cessation was once again emphasized.  Plan: Left VATS, possible thoracotomy, left upper lobectomy on Monday, 09/09/2014.   Melrose Nakayama, MD [facility] [phone]  I spent 45 minutes with Mr. Popper during this visit

## 2014-09-02 ENCOUNTER — Telehealth: Payer: Self-pay | Admitting: *Deleted

## 2014-09-02 NOTE — Telephone Encounter (Signed)
Called patient to update him on no appt for chemotherapy this week.  He verbalized understanding. Appt cancelled per Dr. Julien Nordmann

## 2014-09-04 ENCOUNTER — Other Ambulatory Visit: Payer: Self-pay

## 2014-09-04 ENCOUNTER — Encounter (HOSPITAL_COMMUNITY)
Admission: RE | Admit: 2014-09-04 | Discharge: 2014-09-04 | Disposition: A | Discharge status: Home / Self Care | Payer: Medicaid Other | Source: Ambulatory Visit | Attending: Thoracic Surgery (Cardiothoracic Vascular Surgery) | Admitting: Thoracic Surgery (Cardiothoracic Vascular Surgery)

## 2014-09-04 ENCOUNTER — Encounter (HOSPITAL_COMMUNITY): Payer: Self-pay

## 2014-09-04 VITALS — BP 129/76 | HR 88 | Temp 98.3°F | Resp 20 | Ht 74.0 in | Wt 197.5 lb

## 2014-09-04 DIAGNOSIS — C3412 Malignant neoplasm of upper lobe, left bronchus or lung: Secondary | ICD-10-CM

## 2014-09-04 LAB — COMPREHENSIVE METABOLIC PANEL
ALBUMIN: 3.8 g/dL (ref 3.5–5.2)
ALT: 17 U/L (ref 0–53)
ANION GAP: 8 (ref 5–15)
AST: 21 U/L (ref 0–37)
Alkaline Phosphatase: 84 U/L (ref 39–117)
BILIRUBIN TOTAL: 0.3 mg/dL (ref 0.3–1.2)
BUN: 9 mg/dL (ref 6–23)
CHLORIDE: 104 mmol/L (ref 96–112)
CO2: 26 mmol/L (ref 19–32)
CREATININE: 0.77 mg/dL (ref 0.50–1.35)
Calcium: 9.2 mg/dL (ref 8.4–10.5)
GFR calc Af Amer: >90 mL/min (ref >90)
GFR calc non Af Amer: >90 mL/min (ref >90)
GLUCOSE: 101 mg/dL — AB (ref 70–99)
Potassium: 4.1 mmol/L (ref 3.5–5.1)
Sodium: 138 mmol/L (ref 135–145)
TOTAL PROTEIN: 7.3 g/dL (ref 6.0–8.3)

## 2014-09-04 LAB — BLOOD GAS, ARTERIAL
Acid-Base Excess: 0 mmol/L (ref 0.0–2.0)
BICARBONATE: 24 meq/L (ref 20.0–24.0)
Drawn by: 42180
FIO2: 0.21 %
O2 SAT: 98.4 %
PATIENT TEMPERATURE: 98.6
TCO2: 25.1 mmol/L (ref 0–100)
pCO2 arterial: 37.7 mmHg (ref 35.0–45.0)
pH, Arterial: 7.419 (ref 7.350–7.450)
pO2, Arterial: 106 mmHg — ABNORMAL HIGH (ref 80.0–100.0)

## 2014-09-04 LAB — SURGICAL PCR SCREEN
MRSA, PCR: NEGATIVE
Staphylococcus aureus: NEGATIVE

## 2014-09-04 LAB — CBC
HCT: 37.1 % — ABNORMAL LOW (ref 39.0–52.0)
Hemoglobin: 12.9 g/dL — ABNORMAL LOW (ref 13.0–17.0)
MCH: 27.8 pg (ref 26.0–34.0)
MCHC: 34.8 g/dL (ref 30.0–36.0)
MCV: 80 fL (ref 78.0–100.0)
Platelets: 202 10*3/uL (ref 150–400)
RBC: 4.64 MIL/uL (ref 4.22–5.81)
RDW: 15.5 % (ref 11.5–15.5)
WBC: 7.1 10*3/uL (ref 4.0–10.5)

## 2014-09-04 LAB — APTT: APTT: 30 s (ref 24–37)

## 2014-09-04 LAB — PROTIME-INR
INR: 0.98 (ref 0.00–1.49)
Prothrombin Time: 13.1 seconds (ref 11.6–15.2)

## 2014-09-04 LAB — ABO/RH: ABO/RH(D): O POS

## 2014-09-04 NOTE — Pre-Procedure Instructions (Signed)
Larry [name]  09/04/2014   Your procedure is scheduled on:  09/09/14  Report to [facility] Admitting at 830 AM.  Call this number if you have problems the morning of surgery: [phone]   Remember:   Do not eat food or drink liquids after midnight.   Take these medicines the morning of surgery with A SIP OF WATER: all inhalers   Do not wear jewelry, make-up or nail polish.  Do not wear lotions, powders, or perfumes. You may wear deodorant.  Do not shave 48 hours prior to surgery. Men may shave face and neck.  Do not bring valuables to the hospital.  [facility] is not responsible                  for any belongings or valuables.               Contacts, dentures or bridgework may not be worn into surgery.  Leave suitcase in the car. After surgery it may be brought to your room.  For patients admitted to the hospital, discharge time is determined by your                treatment team.               Patients discharged the day of surgery will not be allowed to drive  home.  Name and phone number of your driver: family  Special Instructions: Shower using CHG 2 nights before surgery and the night before surgery.  If you shower the day of surgery use CHG.  Use special wash - you have one bottle of CHG for all showers.  You should use approximately 1/3 of the bottle for each shower.   Please read over the following fact sheets that you were given: Pain Booklet, Coughing and Deep Breathing, Blood Transfusion Information, MRSA Information and Surgical Site Infection Prevention

## 2014-09-05 ENCOUNTER — Ambulatory Visit: Payer: Medicaid Other

## 2014-09-08 MED ORDER — DEXTROSE 5 % IV SOLN
1.5000 g | INTRAVENOUS | Status: AC
  Administered 2014-09-09: 1.5 g via INTRAVENOUS
  Filled 2014-09-08: qty 1.5

## 2014-09-09 ENCOUNTER — Inpatient Hospital Stay (HOSPITAL_COMMUNITY): Payer: Medicaid Other | Admitting: Critical Care Medicine

## 2014-09-09 ENCOUNTER — Inpatient Hospital Stay (HOSPITAL_COMMUNITY): Payer: Medicaid Other

## 2014-09-09 ENCOUNTER — Encounter (HOSPITAL_COMMUNITY): Payer: Self-pay | Admitting: *Deleted

## 2014-09-09 ENCOUNTER — Inpatient Hospital Stay (HOSPITAL_COMMUNITY)
Admission: RE | Admit: 2014-09-09 | Discharge: 2014-09-14 | DRG: 164 | Disposition: A | Discharge status: Home / Self Care | Payer: Medicaid Other | Source: Ambulatory Visit | Attending: Thoracic Surgery (Cardiothoracic Vascular Surgery) | Admitting: Thoracic Surgery (Cardiothoracic Vascular Surgery)

## 2014-09-09 ENCOUNTER — Encounter (HOSPITAL_COMMUNITY)
Admission: RE | Disposition: A | Discharge status: Home / Self Care | Payer: Self-pay | Source: Ambulatory Visit | Attending: Thoracic Surgery (Cardiothoracic Vascular Surgery)

## 2014-09-09 DIAGNOSIS — Z9221 Personal history of antineoplastic chemotherapy: Secondary | ICD-10-CM | POA: Diagnosis not present

## 2014-09-09 DIAGNOSIS — M1990 Unspecified osteoarthritis, unspecified site: Secondary | ICD-10-CM | POA: Diagnosis present

## 2014-09-09 DIAGNOSIS — C3412 Malignant neoplasm of upper lobe, left bronchus or lung: Secondary | ICD-10-CM | POA: Diagnosis not present

## 2014-09-09 DIAGNOSIS — R000 Tachycardia, unspecified: Secondary | ICD-10-CM | POA: Diagnosis present

## 2014-09-09 DIAGNOSIS — J449 Chronic obstructive pulmonary disease, unspecified: Secondary | ICD-10-CM | POA: Diagnosis not present

## 2014-09-09 DIAGNOSIS — K567 Ileus, unspecified: Secondary | ICD-10-CM | POA: Diagnosis not present

## 2014-09-09 DIAGNOSIS — J9382 Other air leak: Secondary | ICD-10-CM | POA: Diagnosis not present

## 2014-09-09 DIAGNOSIS — Z902 Acquired absence of lung [part of]: Secondary | ICD-10-CM

## 2014-09-09 DIAGNOSIS — E785 Hyperlipidemia, unspecified: Secondary | ICD-10-CM | POA: Diagnosis present

## 2014-09-09 DIAGNOSIS — J939 Pneumothorax, unspecified: Secondary | ICD-10-CM

## 2014-09-09 DIAGNOSIS — F17210 Nicotine dependence, cigarettes, uncomplicated: Secondary | ICD-10-CM | POA: Diagnosis present

## 2014-09-09 DIAGNOSIS — Z4682 Encounter for fitting and adjustment of non-vascular catheter: Secondary | ICD-10-CM

## 2014-09-09 HISTORY — PX: VIDEO ASSISTED THORACOSCOPY (VATS)/ LOBECTOMY: SHX6169

## 2014-09-09 LAB — POCT I-STAT 7, (LYTES, BLD GAS, ICA,H+H)
BICARBONATE: 28.3 meq/L — AB (ref 20.0–24.0)
Calcium, Ion: 1.21 mmol/L (ref 1.13–1.30)
HCT: 36 % — ABNORMAL LOW (ref 39.0–52.0)
Hemoglobin: 12.2 g/dL — ABNORMAL LOW (ref 13.0–17.0)
O2 SAT: 100 %
PH ART: 7.304 — AB (ref 7.350–7.450)
Potassium: 4.6 mmol/L (ref 3.5–5.1)
Sodium: 136 mmol/L (ref 135–145)
TCO2: 30 mmol/L (ref 0–100)
pCO2 arterial: 55.6 mmHg — ABNORMAL HIGH (ref 35.0–45.0)
pO2, Arterial: 326 mmHg — ABNORMAL HIGH (ref 80.0–100.0)

## 2014-09-09 LAB — GLUCOSE, CAPILLARY: Glucose-Capillary: 162 mg/dL — ABNORMAL HIGH (ref 70–99)

## 2014-09-09 LAB — URINALYSIS, ROUTINE W REFLEX MICROSCOPIC
Bilirubin Urine: NEGATIVE
GLUCOSE, UA: NEGATIVE mg/dL
HGB URINE DIPSTICK: NEGATIVE
KETONES UR: NEGATIVE mg/dL
Leukocytes, UA: NEGATIVE
Nitrite: NEGATIVE
Protein, ur: NEGATIVE mg/dL
Specific Gravity, Urine: 1.013 (ref 1.005–1.030)
UROBILINOGEN UA: 0.2 mg/dL (ref 0.0–1.0)
pH: 5 (ref 5.0–8.0)

## 2014-09-09 LAB — PREPARE RBC (CROSSMATCH)

## 2014-09-09 SURGERY — VIDEO ASSISTED THORACOSCOPY (VATS)/ LOBECTOMY
Anesthesia: General | Site: Chest | Laterality: Left

## 2014-09-09 MED ORDER — NALOXONE HCL 0.4 MG/ML IJ SOLN: 0.4000 mg | INTRAMUSCULAR | Status: DC | PRN

## 2014-09-09 MED ORDER — OMEGA-3-ACID ETHYL ESTERS 1 G PO CAPS
1.0000 g | ORAL_CAPSULE | Freq: Every day | ORAL | Status: DC
  Administered 2014-09-09 – 2014-09-13 (×5): 1 g via ORAL
  Filled 2014-09-09 (×6): qty 1

## 2014-09-09 MED ORDER — BUPIVACAINE 0.5 % ON-Q PUMP SINGLE CATH 400 ML
400.0000 mL | INJECTION | Status: DC
  Administered 2014-09-09: 400 mL
  Filled 2014-09-09: qty 400

## 2014-09-09 MED ORDER — INSULIN ASPART 100 UNIT/ML ~~LOC~~ SOLN
0.0000 [IU] | Freq: Four times a day (QID) | SUBCUTANEOUS | Status: DC
  Administered 2014-09-09: 4 [IU] via SUBCUTANEOUS
  Administered 2014-09-10 (×2): 2 [IU] via SUBCUTANEOUS

## 2014-09-09 MED ORDER — BUPIVACAINE HCL (PF) 0.5 % IJ SOLN
INTRAMUSCULAR | Status: DC | PRN
  Administered 2014-09-09: 5 mL

## 2014-09-09 MED ORDER — GLYCOPYRROLATE 0.2 MG/ML IJ SOLN
INTRAMUSCULAR | Status: AC
  Filled 2014-09-09: qty 2

## 2014-09-09 MED ORDER — FENTANYL CITRATE 0.05 MG/ML IJ SOLN
75.0000 ug | Freq: Once | INTRAMUSCULAR | Status: AC
  Administered 2014-09-09: 75 ug via INTRAVENOUS

## 2014-09-09 MED ORDER — ATORVASTATIN CALCIUM 80 MG PO TABS
80.0000 mg | ORAL_TABLET | Freq: Every day | ORAL | Status: DC
  Administered 2014-09-09 – 2014-09-13 (×5): 80 mg via ORAL
  Filled 2014-09-09 (×6): qty 1

## 2014-09-09 MED ORDER — PROMETHAZINE HCL 25 MG/ML IJ SOLN: 6.2500 mg | INTRAMUSCULAR | Status: DC | PRN

## 2014-09-09 MED ORDER — CEFUROXIME SODIUM 1.5 G IJ SOLR
1.5000 g | INTRAMUSCULAR | Status: AC
  Administered 2014-09-09: 1.5 g via INTRAVENOUS
  Filled 2014-09-09: qty 1.5

## 2014-09-09 MED ORDER — PHENYLEPHRINE HCL 10 MG/ML IJ SOLN
10.0000 mg | INTRAVENOUS | Status: DC | PRN
  Administered 2014-09-09: 25 ug/min via INTRAVENOUS

## 2014-09-09 MED ORDER — OXYCODONE HCL 5 MG PO TABS: 5.0000 mg | ORAL_TABLET | ORAL | Status: DC | PRN

## 2014-09-09 MED ORDER — METOPROLOL TARTRATE 1 MG/ML IV SOLN
INTRAVENOUS | Status: DC | PRN
  Administered 2014-09-09: 1 mg via INTRAVENOUS

## 2014-09-09 MED ORDER — DIPHENHYDRAMINE HCL 12.5 MG/5ML PO ELIX
12.5000 mg | ORAL_SOLUTION | Freq: Four times a day (QID) | ORAL | Status: DC | PRN
  Filled 2014-09-09: qty 5

## 2014-09-09 MED ORDER — FENTANYL CITRATE 0.05 MG/ML IJ SOLN
INTRAMUSCULAR | Status: AC
  Filled 2014-09-09: qty 5

## 2014-09-09 MED ORDER — FENTANYL 10 MCG/ML IV SOLN
INTRAVENOUS | Status: DC
  Administered 2014-09-09: 30 ug via INTRAVENOUS
  Administered 2014-09-09: 16:00:00 via INTRAVENOUS
  Administered 2014-09-10: 50 ug via INTRAVENOUS
  Administered 2014-09-10: 130 ug via INTRAVENOUS
  Administered 2014-09-10: 10 ug via INTRAVENOUS
  Administered 2014-09-10: 29.31 ug via INTRAVENOUS
  Administered 2014-09-10: 30 ug via INTRAVENOUS
  Administered 2014-09-10: 200 ug via INTRAVENOUS
  Administered 2014-09-10: 17:00:00 via INTRAVENOUS
  Administered 2014-09-11: 40 ug via INTRAVENOUS
  Administered 2014-09-11: 180 ug via INTRAVENOUS
  Administered 2014-09-11: 100 ug via INTRAVENOUS
  Administered 2014-09-11: 90 ug via INTRAVENOUS
  Administered 2014-09-11: 40 ug via INTRAVENOUS
  Administered 2014-09-11: 60 ug via INTRAVENOUS
  Administered 2014-09-12: 10 ug via INTRAVENOUS
  Administered 2014-09-12: 50 ug via INTRAVENOUS
  Administered 2014-09-12: 20 ug via INTRAVENOUS
  Administered 2014-09-12: 40 ug via INTRAVENOUS
  Administered 2014-09-12: 30 ug via INTRAVENOUS
  Administered 2014-09-13: 0 ug via INTRAVENOUS
  Administered 2014-09-13 (×2): 10 ug via INTRAVENOUS
  Filled 2014-09-09 (×3): qty 50

## 2014-09-09 MED ORDER — ROCURONIUM BROMIDE 100 MG/10ML IV SOLN
INTRAVENOUS | Status: DC | PRN
  Administered 2014-09-09: 40 mg via INTRAVENOUS
  Administered 2014-09-09: 10 mg via INTRAVENOUS

## 2014-09-09 MED ORDER — LIDOCAINE HCL (CARDIAC) 20 MG/ML IV SOLN
INTRAVENOUS | Status: DC | PRN
  Administered 2014-09-09: 70 mg via INTRAVENOUS

## 2014-09-09 MED ORDER — BUPIVACAINE HCL (PF) 0.5 % IJ SOLN
INTRAMUSCULAR | Status: AC
  Filled 2014-09-09: qty 10

## 2014-09-09 MED ORDER — SODIUM CHLORIDE 0.9 % IJ SOLN
9.0000 mL | INTRAMUSCULAR | Status: DC | PRN
  Administered 2014-09-12: 3 mL via INTRAVENOUS
  Filled 2014-09-09: qty 9

## 2014-09-09 MED ORDER — ONDANSETRON HCL 4 MG/2ML IJ SOLN
INTRAMUSCULAR | Status: DC | PRN
  Administered 2014-09-09: 4 mg via INTRAVENOUS

## 2014-09-09 MED ORDER — BUPIVACAINE ON-Q PAIN PUMP (FOR ORDER SET NO CHG)
INJECTION | Status: AC
  Filled 2014-09-09: qty 1

## 2014-09-09 MED ORDER — BUDESONIDE-FORMOTEROL FUMARATE 160-4.5 MCG/ACT IN AERO
2.0000 | INHALATION_SPRAY | Freq: Two times a day (BID) | RESPIRATORY_TRACT | Status: DC
  Administered 2014-09-09 – 2014-09-14 (×8): 2 via RESPIRATORY_TRACT
  Filled 2014-09-09: qty 6

## 2014-09-09 MED ORDER — PROPOFOL 10 MG/ML IV BOLUS
INTRAVENOUS | Status: DC | PRN
  Administered 2014-09-09: 150 mg via INTRAVENOUS

## 2014-09-09 MED ORDER — MIDAZOLAM HCL 2 MG/2ML IJ SOLN
INTRAMUSCULAR | Status: AC
  Filled 2014-09-09: qty 2

## 2014-09-09 MED ORDER — ACETAMINOPHEN 500 MG PO TABS
1000.0000 mg | ORAL_TABLET | Freq: Four times a day (QID) | ORAL | Status: DC
  Administered 2014-09-09 – 2014-09-14 (×17): 1000 mg via ORAL
  Filled 2014-09-09 (×19): qty 2

## 2014-09-09 MED ORDER — VECURONIUM BROMIDE 10 MG IV SOLR
INTRAVENOUS | Status: DC | PRN
  Administered 2014-09-09: 1 mg via INTRAVENOUS
  Administered 2014-09-09 (×4): 2 mg via INTRAVENOUS

## 2014-09-09 MED ORDER — METOPROLOL TARTRATE 1 MG/ML IV SOLN
INTRAVENOUS | Status: AC
  Filled 2014-09-09: qty 5

## 2014-09-09 MED ORDER — 0.9 % SODIUM CHLORIDE (POUR BTL) OPTIME
TOPICAL | Status: DC | PRN
  Administered 2014-09-09: 2000 mL

## 2014-09-09 MED ORDER — HYDROMORPHONE HCL 1 MG/ML IJ SOLN
INTRAMUSCULAR | Status: AC
  Filled 2014-09-09: qty 1

## 2014-09-09 MED ORDER — CEFUROXIME SODIUM 1.5 G IJ SOLR
1.5000 g | Freq: Two times a day (BID) | INTRAMUSCULAR | Status: AC
  Administered 2014-09-09 – 2014-09-10 (×2): 1.5 g via INTRAVENOUS
  Filled 2014-09-09 (×2): qty 1.5

## 2014-09-09 MED ORDER — ACETAMINOPHEN 160 MG/5ML PO SOLN
1000.0000 mg | Freq: Four times a day (QID) | ORAL | Status: DC
  Filled 2014-09-09: qty 40

## 2014-09-09 MED ORDER — BISACODYL 5 MG PO TBEC
10.0000 mg | DELAYED_RELEASE_TABLET | Freq: Every day | ORAL | Status: DC
  Administered 2014-09-10 – 2014-09-13 (×3): 10 mg via ORAL
  Filled 2014-09-09 (×3): qty 2

## 2014-09-09 MED ORDER — MIDAZOLAM HCL 5 MG/ML IJ SOLN: 1.5000 mg | Freq: Once | INTRAMUSCULAR | Status: DC

## 2014-09-09 MED ORDER — TRAMADOL HCL 50 MG PO TABS
50.0000 mg | ORAL_TABLET | Freq: Four times a day (QID) | ORAL | Status: DC | PRN
  Administered 2014-09-13: 100 mg via ORAL
  Filled 2014-09-09: qty 2

## 2014-09-09 MED ORDER — ONDANSETRON HCL 4 MG/2ML IJ SOLN
INTRAMUSCULAR | Status: AC
  Filled 2014-09-09: qty 2

## 2014-09-09 MED ORDER — LACTATED RINGERS IV SOLN
INTRAVENOUS | Status: DC | PRN
  Administered 2014-09-09 (×2): via INTRAVENOUS

## 2014-09-09 MED ORDER — PANTOPRAZOLE SODIUM 40 MG PO TBEC
40.0000 mg | DELAYED_RELEASE_TABLET | Freq: Every day | ORAL | Status: DC
  Administered 2014-09-09 – 2014-09-13 (×5): 40 mg via ORAL
  Filled 2014-09-09 (×5): qty 1

## 2014-09-09 MED ORDER — DIPHENHYDRAMINE HCL 50 MG/ML IJ SOLN: 12.5000 mg | Freq: Four times a day (QID) | INTRAMUSCULAR | Status: DC | PRN

## 2014-09-09 MED ORDER — FENTANYL CITRATE 0.05 MG/ML IJ SOLN
INTRAMUSCULAR | Status: DC | PRN
  Administered 2014-09-09: 150 ug via INTRAVENOUS
  Administered 2014-09-09 (×7): 50 ug via INTRAVENOUS

## 2014-09-09 MED ORDER — DEXAMETHASONE SODIUM PHOSPHATE 4 MG/ML IJ SOLN
INTRAMUSCULAR | Status: DC | PRN
  Administered 2014-09-09: 4 mg via INTRAVENOUS

## 2014-09-09 MED ORDER — STERILE WATER FOR INJECTION IJ SOLN
INTRAMUSCULAR | Status: AC
  Filled 2014-09-09: qty 10

## 2014-09-09 MED ORDER — NEOSTIGMINE METHYLSULFATE 10 MG/10ML IV SOLN
INTRAVENOUS | Status: AC
  Filled 2014-09-09: qty 1

## 2014-09-09 MED ORDER — DEXAMETHASONE SODIUM PHOSPHATE 4 MG/ML IJ SOLN
INTRAMUSCULAR | Status: AC
  Filled 2014-09-09: qty 1

## 2014-09-09 MED ORDER — ONDANSETRON HCL 4 MG/2ML IJ SOLN: 4.0000 mg | Freq: Four times a day (QID) | INTRAMUSCULAR | Status: DC | PRN

## 2014-09-09 MED ORDER — PROPOFOL 10 MG/ML IV BOLUS
INTRAVENOUS | Status: AC
  Filled 2014-09-09: qty 20

## 2014-09-09 MED ORDER — LIDOCAINE HCL (CARDIAC) 20 MG/ML IV SOLN
INTRAVENOUS | Status: AC
  Filled 2014-09-09: qty 5

## 2014-09-09 MED ORDER — LACTATED RINGERS IV SOLN
INTRAVENOUS | Status: DC
  Administered 2014-09-09: 10:00:00 via INTRAVENOUS

## 2014-09-09 MED ORDER — FENTANYL CITRATE 0.05 MG/ML IJ SOLN
INTRAMUSCULAR | Status: AC
  Filled 2014-09-09: qty 2

## 2014-09-09 MED ORDER — MIDAZOLAM HCL 2 MG/2ML IJ SOLN
INTRAMUSCULAR | Status: AC
  Administered 2014-09-09: 1.5 mg
  Filled 2014-09-09: qty 2

## 2014-09-09 MED ORDER — CETYLPYRIDINIUM CHLORIDE 0.05 % MT LIQD
7.0000 mL | Freq: Two times a day (BID) | OROMUCOSAL | Status: DC
  Administered 2014-09-10 – 2014-09-13 (×8): 7 mL via OROMUCOSAL

## 2014-09-09 MED ORDER — POTASSIUM CHLORIDE 10 MEQ/50ML IV SOLN
10.0000 meq | Freq: Every day | INTRAVENOUS | Status: DC | PRN
  Filled 2014-09-09: qty 50

## 2014-09-09 MED ORDER — KCL IN DEXTROSE-NACL 20-5-0.9 MEQ/L-%-% IV SOLN
INTRAVENOUS | Status: DC
  Administered 2014-09-09: 100 mL/h via INTRAVENOUS
  Administered 2014-09-11: via INTRAVENOUS
  Filled 2014-09-09 (×4): qty 1000

## 2014-09-09 MED ORDER — GLYCOPYRROLATE 0.2 MG/ML IJ SOLN
INTRAMUSCULAR | Status: DC | PRN
  Administered 2014-09-09: 0.4 mg via INTRAVENOUS

## 2014-09-09 MED ORDER — SENNOSIDES-DOCUSATE SODIUM 8.6-50 MG PO TABS
1.0000 | ORAL_TABLET | Freq: Every day | ORAL | Status: DC
  Administered 2014-09-09 – 2014-09-13 (×5): 1 via ORAL
  Filled 2014-09-09 (×7): qty 1

## 2014-09-09 MED ORDER — VECURONIUM BROMIDE 10 MG IV SOLR
INTRAVENOUS | Status: AC
  Filled 2014-09-09: qty 10

## 2014-09-09 MED ORDER — NEOSTIGMINE METHYLSULFATE 10 MG/10ML IV SOLN
INTRAVENOUS | Status: DC | PRN
  Administered 2014-09-09: 3 mg via INTRAVENOUS

## 2014-09-09 MED ORDER — HYDROMORPHONE HCL 1 MG/ML IJ SOLN
0.2500 mg | INTRAMUSCULAR | Status: DC | PRN
  Administered 2014-09-09 (×4): 0.5 mg via INTRAVENOUS

## 2014-09-09 SURGICAL SUPPLY — 85 items
ADH SKN CLS LQ APL DERMABOND (GAUZE/BANDAGES/DRESSINGS) ×1
APPLIER CLIP ROT 10 11.4 M/L (STAPLE) ×3
APR CLP MED LRG 11.4X10 (STAPLE) ×1
BAG SPEC RTRVL LRG 6X4 10 (ENDOMECHANICALS) ×1
CANISTER SUCTION 2500CC (MISCELLANEOUS) ×3 IMPLANT
CATH KIT ON Q 5IN SLV (PAIN MANAGEMENT) ×2 IMPLANT
CATH THORACIC 28FR (CATHETERS) IMPLANT
CATH THORACIC 28FR RT ANG (CATHETERS) IMPLANT
CATH THORACIC 36FR (CATHETERS) IMPLANT
CATH THORACIC 36FR RT ANG (CATHETERS) IMPLANT
CLIP APPLIE ROT 10 11.4 M/L (STAPLE) IMPLANT
CLIP TI MEDIUM 6 (CLIP) ×3 IMPLANT
CONN ST 1/4X3/8  BEN (MISCELLANEOUS) ×2
CONN ST 1/4X3/8 BEN (MISCELLANEOUS) IMPLANT
CONN Y 3/8X3/8X3/8  BEN (MISCELLANEOUS)
CONN Y 3/8X3/8X3/8 BEN (MISCELLANEOUS) IMPLANT
CONT SPEC 4OZ CLIKSEAL STRL BL (MISCELLANEOUS) ×26 IMPLANT
COVER SURGICAL LIGHT HANDLE (MISCELLANEOUS) IMPLANT
CUTTER ECHEON FLEX ENDO 45 340 (ENDOMECHANICALS) ×4 IMPLANT
DERMABOND ADHESIVE PROPEN (GAUZE/BANDAGES/DRESSINGS) ×2
DERMABOND ADVANCED .7 DNX6 (GAUZE/BANDAGES/DRESSINGS) IMPLANT
DRAPE LAPAROSCOPIC ABDOMINAL (DRAPES) ×3 IMPLANT
DRAPE WARM FLUID 44X44 (DRAPE) ×3 IMPLANT
ECHELON FLEX 45 POWERED ENDOPATH STAPLER ×2 IMPLANT
ELECT BLADE 6.5 EXT (BLADE) ×1 IMPLANT
ELECT REM PT RETURN 9FT ADLT (ELECTROSURGICAL) ×3
ELECTRODE REM PT RTRN 9FT ADLT (ELECTROSURGICAL) ×1 IMPLANT
GAUZE SPONGE 4X4 12PLY STRL (GAUZE/BANDAGES/DRESSINGS) ×3 IMPLANT
GLOVE SURG SIGNA 7.5 PF LTX (GLOVE) ×6 IMPLANT
GOWN STRL REUS W/ TWL LRG LVL3 (GOWN DISPOSABLE) ×2 IMPLANT
GOWN STRL REUS W/ TWL XL LVL3 (GOWN DISPOSABLE) ×1 IMPLANT
GOWN STRL REUS W/TWL LRG LVL3 (GOWN DISPOSABLE) ×9
GOWN STRL REUS W/TWL XL LVL3 (GOWN DISPOSABLE) ×3
HEMOSTAT SURGICEL 2X14 (HEMOSTASIS) IMPLANT
KIT BASIN OR (CUSTOM PROCEDURE TRAY) ×3 IMPLANT
KIT ROOM TURNOVER OR (KITS) ×3 IMPLANT
KIT SUCTION CATH 14FR (SUCTIONS) IMPLANT
LIQUID BAND (GAUZE/BANDAGES/DRESSINGS) ×2 IMPLANT
NS IRRIG 1000ML POUR BTL (IV SOLUTION) ×6 IMPLANT
PACK CHEST (CUSTOM PROCEDURE TRAY) ×3 IMPLANT
PAD ARMBOARD 7.5X6 YLW CONV (MISCELLANEOUS) ×6 IMPLANT
POUCH ENDO CATCH II 15MM (MISCELLANEOUS) ×2 IMPLANT
POUCH SPECIMEN RETRIEVAL 10MM (ENDOMECHANICALS) ×2 IMPLANT
SCISSORS ENDO CVD 5DCS (MISCELLANEOUS) IMPLANT
SEALANT PROGEL (MISCELLANEOUS) IMPLANT
SEALANT SURG COSEAL 4ML (VASCULAR PRODUCTS) IMPLANT
SEALANT SURG COSEAL 8ML (VASCULAR PRODUCTS) IMPLANT
SOLUTION ANTI FOG 6CC (MISCELLANEOUS) ×3 IMPLANT
SPECIMEN JAR MEDIUM (MISCELLANEOUS) IMPLANT
SPONGE GAUZE 4X4 12PLY STER LF (GAUZE/BANDAGES/DRESSINGS) ×2 IMPLANT
SPONGE INTESTINAL PEANUT (DISPOSABLE) ×6 IMPLANT
SPONGE TONSIL 1 RF SGL (DISPOSABLE) ×2 IMPLANT
STAPLER ECHELON POWERED (MISCELLANEOUS) ×4 IMPLANT
SUT PROLENE 4 0 RB 1 (SUTURE) ×6
SUT PROLENE 4-0 RB1 .5 CRCL 36 (SUTURE) IMPLANT
SUT SILK  1 MH (SUTURE) ×4
SUT SILK 1 MH (SUTURE) ×2 IMPLANT
SUT SILK 1 TIES 10X30 (SUTURE) ×3 IMPLANT
SUT SILK 2 0 SH (SUTURE) IMPLANT
SUT SILK 2 0SH CR/8 30 (SUTURE) IMPLANT
SUT SILK 3 0 SH 30 (SUTURE) ×2 IMPLANT
SUT SILK 3 0SH CR/8 30 (SUTURE) IMPLANT
SUT VIC AB 1 CTX 36 (SUTURE) ×3
SUT VIC AB 1 CTX36XBRD ANBCTR (SUTURE) IMPLANT
SUT VIC AB 2-0 CTX 36 (SUTURE) ×2 IMPLANT
SUT VIC AB 2-0 UR6 27 (SUTURE) ×2 IMPLANT
SUT VIC AB 3-0 MH 27 (SUTURE) IMPLANT
SUT VIC AB 3-0 SH 27 (SUTURE) ×6
SUT VIC AB 3-0 SH 27X BRD (SUTURE) IMPLANT
SUT VIC AB 3-0 X1 27 (SUTURE) ×7 IMPLANT
SUT VICRYL 2 TP 1 (SUTURE) IMPLANT
SWAB COLLECTION DEVICE MRSA (MISCELLANEOUS) IMPLANT
SYR CONTROL 10ML LL (SYRINGE) ×2 IMPLANT
SYSTEM SAHARA CHEST DRAIN ATS (WOUND CARE) ×3 IMPLANT
TAPE CLOTH 4X10 WHT NS (GAUZE/BANDAGES/DRESSINGS) ×3 IMPLANT
TAPE CLOTH SURG 4X10 WHT LF (GAUZE/BANDAGES/DRESSINGS) ×2 IMPLANT
TIP APPLICATOR SPRAY EXTEND 16 (VASCULAR PRODUCTS) IMPLANT
TOWEL OR 17X24 6PK STRL BLUE (TOWEL DISPOSABLE) ×3 IMPLANT
TOWEL OR 17X26 10 PK STRL BLUE (TOWEL DISPOSABLE) ×3 IMPLANT
TRAP SPECIMEN MUCOUS 40CC (MISCELLANEOUS) IMPLANT
TRAY FOLEY CATH 16FRSI W/METER (SET/KITS/TRAYS/PACK) ×3 IMPLANT
TROCAR XCEL BLADELESS 5X75MML (TROCAR) ×3 IMPLANT
TUBE ANAEROBIC SPECIMEN COL (MISCELLANEOUS) IMPLANT
TUNNELER SHEATH ON-Q 11GX8 DSP (PAIN MANAGEMENT) ×2 IMPLANT
WATER STERILE IRR 1000ML POUR (IV SOLUTION) ×4 IMPLANT

## 2014-09-09 NOTE — Progress Notes (Signed)
Pt. Stated he drank 16 oz. Of water this morning at 0500. Notified Dr. Oletta Lamas.

## 2014-09-09 NOTE — Transfer of Care (Signed)
Immediate Anesthesia Transfer of Care Note  Patient: Larry [name]  Procedure(s) Performed: Procedure(s): LEFT VIDEO ASSISTED THORACOSCOPY (VATS) with LEFT UPPER LOBECTOMY (Left)  Patient Location: PACU  Anesthesia Type:General  Level of Consciousness: sedated  Airway & Oxygen Therapy: Patient Spontanous Breathing and Patient connected to face mask oxygen  Post-op Assessment: Report given to RN, Post -op Vital signs reviewed and stable and Patient moving all extremities X 4  Post vital signs: Reviewed and stable  Last Vitals:  Filed Vitals:   09/09/14 1539  BP: 124/69  Pulse: 98  Temp: 36.4 C  Resp: 19    Complications: No apparent anesthesia complications

## 2014-09-09 NOTE — Anesthesia Postprocedure Evaluation (Signed)
  Anesthesia Post-op Note  Patient: Larry [name]  Procedure(s) Performed: Procedure(s): LEFT VIDEO ASSISTED THORACOSCOPY (VATS) with LEFT UPPER LOBECTOMY (Left)  Patient Location: PACU  Anesthesia Type:General  Level of Consciousness: awake  Airway and Oxygen Therapy: Patient Spontanous Breathing  Post-op Pain: mild  Post-op Assessment: Post-op Vital signs reviewed  Post-op Vital Signs: Reviewed  Last Vitals:  Filed Vitals:   09/09/14 1600  BP: 139/83  Pulse: 76  Temp:   Resp: 17    Complications: No apparent anesthesia complications

## 2014-09-09 NOTE — H&P (View-Only) (Signed)
[address]       [place] [address]             [phone]      PCP is Millsaps, Luane School, NP Referring Provider is Curt Bears, MD  Chief Complaint  Patient presents with  . 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  He was diagnosed with this at bronchoscopy after CT of the chest showed a left hilar mass and possible hilar adenopathy. The mass was in close proximity to the main pulmonary artery and it was felt that a pneumonectomy would be required for primary resection. He is not a candidate for that procedure due to his pulmonary function tests.  He then was treated with 3 cycles of carboplatin and Taxol. He tolerated that well for the most part, but did develop hives about 2 weeks after his last cycle of chemotherapy. He had a repeat CT of the chest which showed a decrease in the size of the left hilar mass. There also was a small right lower lobe nodule that decreased in size from 19mm to 3 mm. It is unclear if this represents a second primary.  He says that his weight has been stable. He denies any chest pain, pressure, or tightness. He has not had any unusual headaches or visual changes. He continues to smoke. He has about a 45-pack-year history of smoking. He says he is down to 3-4 cigarettes per day. Of note, this is exactly what he told me his last visit in January.  Zubrod Score: At the time of surgery this patient's most appropriate activity status/level should be described as: []     0    Normal activity, no symptoms [x]     1    Restricted in physical strenuous activity but ambulatory, able to do out light work []     2    Ambulatory and capable of self care, unable to do work activities, up and about >50 % of waking hours                              []     3    Only limited self  care, in bed greater than 50% of waking hours []     4    Completely disabled, no self care, confined to bed or chair []     5    Moribund     Past Medical History  Diagnosis Date  . Hyperlipidemia   . COPD (chronic obstructive pulmonary disease)   . Arthritis   . Non-small cell carcinoma of left lung     Past Surgical History  Procedure Laterality Date  . Colonoscopy    . Endobronchial ultrasound Bilateral 04/08/2014    Procedure: ENDOBRONCHIAL ULTRASOUND;  Surgeon: Kathee Delton, MD;  Location: [facility];  Service: Cardiopulmonary;  Laterality: Bilateral;  . Video bronchoscopy with endobronchial navigation Right 04/24/2014    Procedure: VIDEO BRONCHOSCOPY WITH ENDOBRONCHIAL NAVIGATION;  Surgeon: Collene Gobble, MD;  Location: [facility];  Service: Thoracic;  Laterality: Right;  . Video bronchoscopy with endobronchial ultrasound Right 04/24/2014    Procedure: VIDEO BRONCHOSCOPY WITH ENDOBRONCHIAL ULTRASOUND;  Surgeon: Collene Gobble, MD;  Location: [facility];  Service: Thoracic;  Laterality: Right;    Family  History  Problem Relation Age of Onset  . Heart attack Mother   . Heart attack Father   . Diabetes Mother   . Heart failure Father   . Cancer Brother     Social History History  Substance Use Topics  . Smoking status: Current Some Day Smoker -- 1.00 packs/day for 45 years    Types: Cigarettes  . Smokeless tobacco: Never Used     Comment: 3-4 cigs daily 04/12/14.   Marland Kitchen Alcohol Use: No    Current Outpatient Prescriptions  Medication Sig Dispense Refill  . atorvastatin (LIPITOR) 80 MG tablet Take 80 mg by mouth daily.    . budesonide-formoterol (SYMBICORT) 160-4.5 MCG/ACT inhaler Inhale 2 puffs into the lungs 2 (two) times daily. 1 Inhaler 11  . Omega-3 Fatty Acids (FISH OIL PO) Take 1 tablet by mouth 2 (two) times daily.      No current facility-administered medications for this visit.    No Known Allergies  Review of Systems  Constitutional: Negative for fever,  activity change, appetite change and unexpected weight change.  Respiratory: Positive for cough and shortness of breath. Negative for wheezing.   Cardiovascular: Negative for chest pain.  Neurological: Negative.   Hematological: Negative for adenopathy. Does not bruise/bleed easily.  All other systems reviewed and are negative.   Ht 6\' 2"  (1.88 m)  Wt 195 lb (88.451 kg)  BMI 25.03 kg/m2  SpO2  Physical Exam  Constitutional: He is oriented to person, place, and time. He appears well-developed and well-nourished. No distress.  HENT:  Head: Normocephalic and atraumatic.  Eyes: EOM are normal. Pupils are equal, round, and reactive to light.  Neck: Neck supple. No thyromegaly present.  Cardiovascular: Normal rate, regular rhythm and normal heart sounds.   No murmur heard. Pulmonary/Chest: Effort normal and breath sounds normal. He has no wheezes. He has no rales.  Abdominal: Soft. There is no tenderness.  Musculoskeletal: He exhibits no edema.  Lymphadenopathy:    He has no cervical adenopathy.  Neurological: He is alert and oriented to person, place, and time. No cranial nerve deficit.  Skin: Skin is warm and dry.  Vitals reviewed.    Diagnostic Tests: CT CHEST WITH CONTRAST 08/12/14  TECHNIQUE: Multidetector CT imaging of the chest was performed during intravenous contrast administration.  CONTRAST: 152mL OMNIPAQUE IOHEXOL 300 MG/ML SOLN  COMPARISON: 04/15/2014 chest CT, PET-CT 02/05/2014  FINDINGS: Mediastinum/Nodes: Allowing for poor visualization of the left suprahilar mass separate from the left upper lobe and main pulmonary artery, the mass appears subjectively decreased in size, now 1.5 x 1.3 cm image 30.  Moderate atheromatous aortic and coronary arterial calcifications are reidentified without evidence for aneurysm.  Trace fluid within the superior pericardial recess is identified.  Stable 0.7 cm short axis diameter pretracheal lymph  node reidentified image 26.  Lungs/Pleura: Diffuse emphysematous changes reidentified.  Right lower lobe spiculated nodule has decreased further in size, now 0.4 cm maximally image 32 compared to 0.7 cm previously. Previously questioned endobronchial lesion within the left upper lobe is no longer identified, compatible with previously seen mucous plugs. No new pulmonary nodule, mass, or consolidation is identified.  Upper abdomen: Adrenal glands are unremarkable.  Musculoskeletal: No lytic or sclerotic osseous lesion or acute osseous finding.  IMPRESSION: Decrease in size of left suprahilar squamous cell carcinoma and irregular right lower lobe pulmonary parenchymal nodule, compatible with response to treatment.  No new abnormality within the chest is identified.   Electronically Signed  By: Roena Malady.D.  PULMONARY FUNCTION TESTING   FVC= 2.47(55%) FEV1= 1.43(42%) FEV1/FVC= 58%   I personally reviewed the CT scans, previous PET scan, and pathology. I also reviewed the before and after CT scan with Mr. Schamp.  Impression: Mr. Glasheen is a 64 year old smoker with a squamous cell carcinoma of the left upper lobe. This is clinically a stage IIA lesion (T1, N1). The mass abuts the main pulmonary artery. He was treated with 3 cycles of cis-platinum and Taxol for neoadjuvant therapy. He has had a good initial response, albeit a partial one.  I reviewed the films with Mr. Balandran. We discussed the options going forward. Those include an attempt at surgical resection versus continue with chemotherapy with or without radiation. I discussed the relative advantages and disadvantages to each of those approaches. He understands that a complete surgical resection gives him the best chance for a long-term cure. He also understands that there is no guarantee of cure.  I do think he could tolerate a lobectomy. It still is not entirely clear from the CT that a lobectomy is a  possibility given the proximity to the pulmonary artery. Certainly the dissection of the upper lobe branches may be difficult. I do think that given his good response it is worth considering. He is a moderate risk patient for a lobectomy. Again I do not think a pneumonectomy is an option.  I discussed the indications, risks, benefits, and alternatives with Mr. Carchi. I described the general nature of the operation, including the need for general anesthesia, the incisions to be used, the expected hospital stay, and the overall recovery. He understands the risks include, but are not limited to death, MI, DVT, PE, stroke, bleeding, possible need for transfusion, infection, prolonged air leak, cardiac arrhythmias, as well as the possibility of unforeseeable complications.  He wishes to proceed with surgical resection.  The importance of smoking cessation was once again emphasized.  Plan: Left VATS, possible thoracotomy, left upper lobectomy on Monday, 09/09/2014.   Melrose Nakayama, MD [facility] [phone]  I spent 45 minutes with Mr. Joyce during this visit

## 2014-09-09 NOTE — Interval H&P Note (Signed)
History and Physical Interval Note:  09/09/2014 9:39 AM  Lauralee Evener  has presented today for surgery, with the diagnosis of LUL SQUAMOUS CELL CARCINOMA  The various methods of treatment have been discussed with the patient and family. After consideration of risks, benefits and other options for treatment, the patient has consented to  Procedure(s): LEFT VIDEO ASSISTED THORACOSCOPY (VATS)/ LEFT UPPER LOBECTOMY (Left) as a surgical intervention .  The patient's history has been reviewed, patient examined, no change in status, stable for surgery.  I have reviewed the patient's chart and labs.  Questions were answered to the patient's satisfaction.     Melrose Nakayama

## 2014-09-09 NOTE — Progress Notes (Signed)
      [address]       [place] [address]             [phone]      Resting comfortably  BP 147/84 mmHg  Pulse 95  Temp(Src) 97.7 F (36.5 C) (Oral)  Resp 15  SpO2 100%   Intake/Output Summary (Last 24 hours) at 09/09/14 1725 Last data filed at 09/09/14 1630  Gross per 24 hour  Intake   1700 ml  Output   1384 ml  Net    316 ml    Small air leak, minimal CT drainage  Doing well early postop  Remo Lipps C. Roxan Hockey, MD [facility] [phone]

## 2014-09-09 NOTE — Anesthesia Procedure Notes (Addendum)
Procedure Name: Intubation Date/Time: 09/09/2014 10:18 AM Performed by: Merrilyn Puma B Pre-anesthesia Checklist: Patient identified, Timeout performed, Emergency Drugs available, Suction available and Patient being monitored Patient Re-evaluated:Patient Re-evaluated prior to inductionOxygen Delivery Method: Circle system utilized Preoxygenation: Pre-oxygenation with 100% oxygen Intubation Type: IV induction Ventilation: Mask ventilation without difficulty and Oral airway inserted - appropriate to patient size Laryngoscope Size: Mac and 3 Grade View: Grade I Endobronchial tube: Double lumen EBT, EBT position confirmed by fiberoptic bronchoscope and EBT position confirmed by auscultation and 39 Fr Number of attempts: 1 Airway Equipment and Method: Stylet Placement Confirmation: CO2 detector,  positive ETCO2,  ETT inserted through vocal cords under direct vision and breath sounds checked- equal and bilateral Tube secured with: Tape Dental Injury: Teeth and Oropharynx as per pre-operative assessment     RIJ CVP Dual Luaemn: [id number] The patient was identified and consent obtained.  TO was performed, and full barrier precautions were used.  The skin was anesthetized with lidocaine.  Once the vein was located with the 22 ga. needle using ultrasound guidance , the wire was inserted into the vein.  The wire location was confirmed with ultrasound.  The tissue was dilated and the catheter was carefully inserted, then sutured in place. A dressing was applied. The patient tolerated the procedure well.   CE

## 2014-09-09 NOTE — Brief Op Note (Addendum)
09/09/2014  3:20 PM  PATIENT:  Larry [name]  64 y.o. male  PRE-OPERATIVE DIAGNOSIS:  LEFT UPPER LOBE SQUAMOUS CELL CARCINOMA, CLINICAL STAGE IIA  POST-OPERATIVE DIAGNOSIS:  LEFT UPPER LOBE SQUAMOUS CELL CARCINOMA, CLINICAL STAGE IIA  PROCEDURE:  LEFT VIDEO ASSISTED THORACOSCOPY (VATS) THORACOSCOPIC LEFT UPPER LOBECTOMY MEDIASTINAL LYMPH NODE DISSECTION, ON Q LOCAL ANESTHETIC CATHTER PLACEMENT  SURGEON:  Surgeon(s) and Role:    * Melrose Nakayama, MD - Primary  PHYSICIAN ASSISTANT: Lars Pinks PA-C  ANESTHESIA:   general  EBL:  Total I/O In: 1700 [I.V.:1700] Out: 1125 [Urine:925; Blood:200]  BLOOD ADMINISTERED:none  DRAINS: 58 Bard drain and straight chest tube placed in the left pleural space   LOCAL MEDICATIONS USED:  MARCAINE     SPECIMEN:  Source of Specimen:  LUL, multiple lymph nodes.  DISPOSITION OF SPECIMEN:  PATHOLOGY. Margins NEGATIVE for cancer. Frozen lymph node 11 positive for cancer.  COUNTS CORRECT:  YES  PLAN OF CARE: Admit to inpatient   PATIENT DISPOSITION:  PACU - hemodynamically stable.   Delay start of Pharmacological VTE agent (>24hrs) due to surgical blood loss or risk of bleeding: yes   FINDINGS- central left upper lobe mass. Bronchial margin negative, + level 11 node adjacent to bronchus

## 2014-09-09 NOTE — Anesthesia Preprocedure Evaluation (Addendum)
Anesthesia Evaluation  Patient identified by MRN, date of birth, ID band Patient awake    Reviewed: Allergy & Precautions, NPO status , Patient's Chart, lab work & pertinent test results  Airway Mallampati: II  TM Distance: >3 FB Neck ROM: Full    Dental  (+) Dental Advisory Given   Pulmonary shortness of breath and with exertion, COPDCurrent Smoker,  breath sounds clear to auscultation        Cardiovascular negative cardio ROS  Rhythm:Regular Rate:Normal     Neuro/Psych    GI/Hepatic negative GI ROS, Neg liver ROS,   Endo/Other  negative endocrine ROS  Renal/GU negative Renal ROS     Musculoskeletal   Abdominal   Peds  Hematology   Anesthesia Other Findings   Reproductive/Obstetrics                          Anesthesia Physical Anesthesia Plan  ASA: III  Anesthesia Plan: General   Post-op Pain Management:    Induction: Intravenous  Airway Management Planned: Double Lumen EBT  Additional Equipment: Arterial line and CVP  Intra-op Plan:   Post-operative Plan: Possible Post-op intubation/ventilation  Informed Consent: I have reviewed the patients History and Physical, chart, labs and discussed the procedure including the risks, benefits and alternatives for the proposed anesthesia with the patient or authorized representative who has indicated his/her understanding and acceptance.   Dental advisory given  Plan Discussed with: Anesthesiologist, Surgeon and CRNA  Anesthesia Plan Comments:       Anesthesia Quick Evaluation

## 2014-09-10 ENCOUNTER — Inpatient Hospital Stay (HOSPITAL_COMMUNITY): Payer: Medicaid Other

## 2014-09-10 LAB — POCT I-STAT 3, ART BLOOD GAS (G3+)
Acid-Base Excess: 4 mmol/L — ABNORMAL HIGH (ref 0.0–2.0)
Bicarbonate: 29.2 mEq/L — ABNORMAL HIGH (ref 20.0–24.0)
O2 SAT: 98 %
PO2 ART: 98 mmHg (ref 80.0–100.0)
Patient temperature: 98.7
TCO2: 31 mmol/L (ref 0–100)
pCO2 arterial: 44.5 mmHg (ref 35.0–45.0)
pH, Arterial: 7.425 (ref 7.350–7.450)

## 2014-09-10 LAB — CBC
HEMATOCRIT: 33.7 % — AB (ref 39.0–52.0)
Hemoglobin: 11.7 g/dL — ABNORMAL LOW (ref 13.0–17.0)
MCH: 27.6 pg (ref 26.0–34.0)
MCHC: 34.7 g/dL (ref 30.0–36.0)
MCV: 79.5 fL (ref 78.0–100.0)
PLATELETS: 185 10*3/uL (ref 150–400)
RBC: 4.24 MIL/uL (ref 4.22–5.81)
RDW: 15.2 % (ref 11.5–15.5)
WBC: 13.3 10*3/uL — AB (ref 4.0–10.5)

## 2014-09-10 LAB — GLUCOSE, CAPILLARY
GLUCOSE-CAPILLARY: 125 mg/dL — AB (ref 70–99)
GLUCOSE-CAPILLARY: 138 mg/dL — AB (ref 70–99)
GLUCOSE-CAPILLARY: 144 mg/dL — AB (ref 70–99)
GLUCOSE-CAPILLARY: 146 mg/dL — AB (ref 70–99)
GLUCOSE-CAPILLARY: 136 mg/dL — AB (ref 70–99)

## 2014-09-10 LAB — BASIC METABOLIC PANEL
Anion gap: 8 (ref 5–15)
BUN: 8 mg/dL (ref 6–23)
CO2: 27 mmol/L (ref 19–32)
Calcium: 8.6 mg/dL (ref 8.4–10.5)
Chloride: 100 mmol/L (ref 96–112)
Creatinine, Ser: 0.74 mg/dL (ref 0.50–1.35)
GFR calc Af Amer: >90 mL/min (ref >90)
Glucose, Bld: 140 mg/dL — ABNORMAL HIGH (ref 70–99)
POTASSIUM: 4.1 mmol/L (ref 3.5–5.1)
SODIUM: 135 mmol/L (ref 135–145)

## 2014-09-10 MED ORDER — IPRATROPIUM-ALBUTEROL 20-100 MCG/ACT IN AERS: 2.0000 | INHALATION_SPRAY | Freq: Four times a day (QID) | RESPIRATORY_TRACT | Status: DC

## 2014-09-10 MED ORDER — IPRATROPIUM-ALBUTEROL 0.5-2.5 (3) MG/3ML IN SOLN
3.0000 mL | Freq: Four times a day (QID) | RESPIRATORY_TRACT | Status: DC | PRN
  Filled 2014-09-10: qty 3

## 2014-09-10 MED ORDER — IPRATROPIUM-ALBUTEROL 0.5-2.5 (3) MG/3ML IN SOLN
3.0000 mL | Freq: Four times a day (QID) | RESPIRATORY_TRACT | Status: DC
  Administered 2014-09-10: 3 mL via RESPIRATORY_TRACT
  Filled 2014-09-10: qty 3

## 2014-09-10 MED ORDER — INSULIN ASPART 100 UNIT/ML ~~LOC~~ SOLN
0.0000 [IU] | Freq: Three times a day (TID) | SUBCUTANEOUS | Status: DC
  Administered 2014-09-10 – 2014-09-14 (×6): 2 [IU] via SUBCUTANEOUS

## 2014-09-10 MED ORDER — IPRATROPIUM-ALBUTEROL 0.5-2.5 (3) MG/3ML IN SOLN
3.0000 mL | Freq: Three times a day (TID) | RESPIRATORY_TRACT | Status: DC
  Administered 2014-09-10 – 2014-09-11 (×5): 3 mL via RESPIRATORY_TRACT
  Filled 2014-09-10 (×6): qty 3

## 2014-09-10 MED ORDER — ENOXAPARIN SODIUM 40 MG/0.4ML ~~LOC~~ SOLN
40.0000 mg | SUBCUTANEOUS | Status: DC
  Administered 2014-09-10 – 2014-09-13 (×4): 40 mg via SUBCUTANEOUS
  Filled 2014-09-10 (×5): qty 0.4

## 2014-09-10 MED ORDER — IPRATROPIUM-ALBUTEROL 20-100 MCG/ACT IN AERS: 2.0000 | INHALATION_SPRAY | Freq: Four times a day (QID) | RESPIRATORY_TRACT | Status: DC | PRN

## 2014-09-10 NOTE — Progress Notes (Signed)
1 Day Post-Op Procedure(s) (LRB): LEFT VIDEO ASSISTED THORACOSCOPY (VATS) with LEFT UPPER LOBECTOMY (Left) Subjective: Some incisional pain Denies nausea  Objective: Vital signs in last 24 hours: Temp:  [97.6 F (36.4 C)-99.7 F (37.6 C)] 97.6 F (36.4 C) (04/12 0713) Pulse Rate:  [68-113] 100 (04/12 0800) Cardiac Rhythm:  [-] Normal sinus rhythm (04/12 0800) Resp:  [6-21] 20 (04/12 0800) BP: (105-147)/(62-94) 134/79 mmHg (04/12 0800) SpO2:  [97 %-100 %] 98 % (04/12 0800) Arterial Line BP: (84-168)/(68-103) 140/68 mmHg (04/12 0800)  Hemodynamic parameters for last 24 hours:    Intake/Output from previous day: 04/11 0701 - 04/12 0700 In: 3124.7 [P.O.:120; I.V.:2954.7; IV Piggyback:50] Out: 2500 [Urine:2860; Blood:200; Chest Tube:368] Intake/Output this shift: Total I/O In: 100 [I.V.:100] Out: 130 [Urine:130]  General appearance: alert, cooperative and no distress Neurologic: intact Heart: regular rate and rhythm Lungs: rhonchi bilaterally Abdomen: normal findings: soft, non-tender small air leak  Lab Results:  Recent Labs  09/09/14 1318 09/10/14 0500  WBC  --  13.3*  HGB 12.2* 11.7*  HCT 36.0* 33.7*  PLT  --  185   BMET:  Recent Labs  09/09/14 1318 09/10/14 0500  NA 136 135  K 4.6 4.1  CL  --  100  CO2  --  27  GLUCOSE  --  140*  BUN  --  8  CREATININE  --  0.74  CALCIUM  --  8.6    PT/INR: No results for input(s): LABPROT, INR in the last 72 hours. ABG    Component Value Date/Time   PHART 7.425 09/10/2014 0538   HCO3 29.2* 09/10/2014 0538   TCO2 31 09/10/2014 0538   O2SAT 98.0 09/10/2014 0538   CBG (last 3)   Recent Labs  09/09/14 1835 09/10/14 0102 09/10/14 0506  GLUCAP 162* 125* 138*    Assessment/Plan: S/P Procedure(s) (LRB): LEFT VIDEO ASSISTED THORACOSCOPY (VATS) with LEFT UPPER LOBECTOMY (Left) Plan for transfer to step-down: see transfer orders   Doing well POD # 1 Has a small air leak- will try on water seal SCD for DVT  prophylaxis- add enoxaparin Mobilize Dc a line DC Foley   LOS: 1 day    Larry [name] 09/10/2014

## 2014-09-10 NOTE — Op Note (Signed)
NAME:  ACHERON, SUGG NO.:  [id number]  MEDICAL RECORD NO.:  [id number]  LOCATION:  2S15C                        FACILITY:  [facility]  PHYSICIAN:  Revonda Standard. 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  PROCEDURES:   Left video-assisted thoracoscopy Thoracoscopic left upper lobectomy Mediastinal lymph node dissection On-Q local anesthetic catheter placement.  SURGEON:  Revonda Standard. Roxan Hockey, M.D.  ASSISTANT:  Lars Pinks, PA  ANESTHESIA:  General.  FINDINGS:  Central left upper lobe mass, very difficult to dissect off the pulmonary artery. Enlarged node adjacent to the left upper lobe bronchus was positive for non-small cell carcinoma.  Bronchial margin was clear.  4L node showed no evidence of carcinoma.  CLINICAL NOTE:  Mr. Brau is a 64 year old gentleman who recently was diagnosed with squamous cell carcinoma of the left upper lobe.  The mass was in close proximity to the main pulmonary artery and it was felt that a pneumonectomy would be required to resect the lesion.  He was not a candidate for that due to his pulmonary function testing.  He was treated with three cycles of neoadjuvant chemotherapy with carboplatin and Taxol.  A repeat CT of the chest showed a significant decrease in the size of the left hilar mass, although was still in close proximity to the pulmonary artery.  It was felt reasonable to attempt a left upper lobectomy.  The indications, risks, benefits, and alternatives were discussed in detail with the patient.  He understood and accepted the risks and agreed to proceed.  He  did understand there was a possibility that a resection might not be feasible.  OPERATIVE NOTE:  Mr. Besecker was brought to the preoperative holding area on September 09, 2014, there, the Anesthesia Service placed a central line and an arterial blood pressure monitoring line.  He was taken to the operating room, anesthetized, and intubated with a double-lumen endotracheal tube.  A Foley catheter was placed.  Intravenous antibiotics were administered.  Sequential compressive devices were placed on the calves for DVT prophylaxis.  He was placed in a right lateral decubitus position, and the left chest was prepped and draped in usual sterile fashion.  An incision was made in the midaxillary line in the seventh intercostal space.  The chest was entered bluntly using a 5- mm port.  There was good isolation of the left lung.  The thoracoscope was advanced into the chest.  There was no pleural effusion and no visible abnormality of the visceral or parietal pleura.  A small working Incision, 5 cm in length, was made in the fourth intercostal space anterolaterally.  No rib spreading was performed during the procedure. A second port incision was made anterior to the first for instrumentation and placement of the staplers.  A level 4L lymph node was visible under the pleura.  The pleura was incised and the lymph node was  taken and sent for frozen section, which subsequently returned showing no evidence of disease.  While awaiting that result, the pleura overlying the aortopulmonary window was incised and level 5 lymph nodes were removed, these were similar in appearance to the 4L node. The level 5 nodes were sent for permanent pathology.  Next, the inferior pulmonary ligament was divided.  A level 9 inferior pulmonary ligament node was identified. It was removed and sent for pathology.  The pleural reflection was divided at the hilum medially.  The fissure was inspected.  The pulmonary artery was visible in  the midportion of the fissure.  The anterior portion of the fissure was nearly complete and was divided with electrocautery. Posteriorly the fissure was not as complete and was divided with an endoscopic stapler.  Multiple enlarged lymph nodes were encountered during the dissection. All of these nodes were dissected out and sent separately for permanent pathology.  The decision was made to proceed with resection, although it was still not clear that this would be entirely feasible.  The lingular vein branches were dissected out, encircled and divided with an endoscopic vascular stapler.  Next, two lingular arterial branches were Identified. These were relatively widely separated. They were divided separately with the endoscopic vascular stapler.  At this point, the remaining pulmonary artery branches could be identified and it was felt that this lesion could be resected, although was still not clear whether a sleeve resection or some type of cut and sew repair would be necessary on the main pulmonary artery.  A posterior branch was identified arising from the main pulmonary artery.  This was dissected out and divided with the vascular stapler.  Next, the dissection was carried back anteriorly and the remainder of the pulmonary vein was encircled and divided with the stapler.  There were two pulmonary arterial branches visible, the more anterior one was easily dissected out and divided. However, the final left upper lobe pulmonary arterial branch was in close proximity to the tumor and the bronchus.  Dissection in this area was very tedious and ultimately it was determined that the bronchus would need to be divided before the final pulmonary arterial branch.  In order to place a stapler across the bronchus, a third port-type incision was made posteriorly in line with the tip of the scapula.  A 45-mm stapler was placed across the left upper lobe bronchus.  A test inflation showed good  aeration of the lower lobe.  The stapler was fired transecting the bronchus.  After this was done, gentle upward traction on the upper lobe allowed the remaining branch to safely be divided conventionally with stapler rather than needing to be done with a cut-and-sew technique.  The stapler was placed across the branch and fired, dividing the final pulmonary arterial branch to the upper lobe and the specimen was now free.  It was placed into an endoscopic retrieval bag and removed through the utility incision.  Inspection of the specimen showed a large node adjacent to the bronchus, but not invading the bronchus.  This node was dissected off and sent separately as level 11 #6.  It was sent for frozen section because of high index of suspicion. The bronchial margin also was sent for frozen section.  While awaiting those results, additional nodes were taken.  The chest was filled with warm saline.  A test inflation to 30 cm of water showed no leakage from the bronchial stump.  There was a small air leak from the superior segment  along the fissure.  Staples and sutures were used to decrease the air leak, although there was still a small leak present.  At this point, the bronchial margin had come back negative for tumor.  The level 11 #6 lymph node was positive for metastatic carcinoma.  An attempt was made to identify and isolate the subcarinal nodes, but this was not possible. The chest was again copiously irrigated with warm saline.  An On-Q local anesthetic catheter was placed through a port incision posteriorly and tunneled into a subpleural location.  It was primed with 5 mL of 0.5% Marcaine.  A 28-French Blake drain was placed through the original port incision and a 28-French chest tube was placed through the most anterior port incision, these were both secured with #1 silk sutures.  The remaining posterior port incision was closed in standard fashion as was the utility incision.  The  left lung was reinflated.  The chest tubes were placed to suction.  The patient was placed back in the supine position.  He was extubated in the operating room and taken to the postanesthetic care unit in good condition.     Revonda Standard Roxan Hockey, M.D.     SCH/MEDQ  D:  09/09/2014  T:  09/10/2014  Job:  [id number]

## 2014-09-10 NOTE — Progress Notes (Signed)
CT surgery p.m. Rounds  Patient examined and record reviewed.Hemodynamics stable,labs satisfactory.Patient had stable day small-moderate air leak with cough [place].Continue current care. Tharon Aquas Trigt III 09/10/2014

## 2014-09-11 ENCOUNTER — Inpatient Hospital Stay (HOSPITAL_COMMUNITY): Payer: Medicaid Other

## 2014-09-11 LAB — CBC
HEMATOCRIT: 32.9 % — AB (ref 39.0–52.0)
Hemoglobin: 11.3 g/dL — ABNORMAL LOW (ref 13.0–17.0)
MCH: 27.6 pg (ref 26.0–34.0)
MCHC: 34.3 g/dL (ref 30.0–36.0)
MCV: 80.2 fL (ref 78.0–100.0)
Platelets: 168 10*3/uL (ref 150–400)
RBC: 4.1 MIL/uL — ABNORMAL LOW (ref 4.22–5.81)
RDW: 15.2 % (ref 11.5–15.5)
WBC: 11.8 10*3/uL — AB (ref 4.0–10.5)

## 2014-09-11 LAB — GLUCOSE, CAPILLARY
GLUCOSE-CAPILLARY: 118 mg/dL — AB (ref 70–99)
GLUCOSE-CAPILLARY: 112 mg/dL — AB (ref 70–99)
Glucose-Capillary: 125 mg/dL — ABNORMAL HIGH (ref 70–99)
Glucose-Capillary: 136 mg/dL — ABNORMAL HIGH (ref 70–99)

## 2014-09-11 LAB — COMPREHENSIVE METABOLIC PANEL
ALT: 17 U/L (ref 0–53)
AST: 26 U/L (ref 0–37)
Albumin: 3 g/dL — ABNORMAL LOW (ref 3.5–5.2)
Alkaline Phosphatase: 62 U/L (ref 39–117)
Anion gap: 8 (ref 5–15)
BUN: 7 mg/dL (ref 6–23)
CO2: 27 mmol/L (ref 19–32)
Calcium: 8.3 mg/dL — ABNORMAL LOW (ref 8.4–10.5)
Chloride: 99 mmol/L (ref 96–112)
Creatinine, Ser: 0.82 mg/dL (ref 0.50–1.35)
GFR calc Af Amer: >90 mL/min (ref >90)
Glucose, Bld: 124 mg/dL — ABNORMAL HIGH (ref 70–99)
Potassium: 3.8 mmol/L (ref 3.5–5.1)
Sodium: 134 mmol/L — ABNORMAL LOW (ref 135–145)
Total Bilirubin: 0.7 mg/dL (ref 0.3–1.2)
Total Protein: 6.2 g/dL (ref 6.0–8.3)

## 2014-09-11 LAB — TYPE AND SCREEN
ABO/RH(D): O POS
Antibody Screen: NEGATIVE
UNIT DIVISION: 0
Unit division: 0

## 2014-09-11 MED ORDER — METOCLOPRAMIDE HCL 5 MG/ML IJ SOLN
10.0000 mg | Freq: Four times a day (QID) | INTRAMUSCULAR | Status: AC
  Administered 2014-09-11 (×3): 10 mg via INTRAVENOUS
  Filled 2014-09-11 (×3): qty 2

## 2014-09-11 MED ORDER — SODIUM CHLORIDE 0.9 % IJ SOLN
10.0000 mL | Freq: Two times a day (BID) | INTRAMUSCULAR | Status: DC
  Administered 2014-09-11 – 2014-09-12 (×3): 10 mL via INTRAVENOUS
  Administered 2014-09-13: 3 mL via INTRAVENOUS

## 2014-09-11 MED ORDER — POTASSIUM CHLORIDE IN NACL 20-0.45 MEQ/L-% IV SOLN
INTRAVENOUS | Status: DC
  Administered 2014-09-11 – 2014-09-12 (×2): via INTRAVENOUS
  Filled 2014-09-11 (×3): qty 1000

## 2014-09-11 NOTE — Progress Notes (Signed)
Patient ID: Larry [name], male   DOB: 05-12-1951, 64 y.o.   MRN: [id number] SICU Evening Rounds:  Hemodynamically stable Sinus tach 110. Increased to 150 when he walked  Urine output ok  CT output low. No air leak  Awaiting bed on 3S.

## 2014-09-11 NOTE — Progress Notes (Signed)
2 Days Post-Op Procedure(s) (LRB): LEFT VIDEO ASSISTED THORACOSCOPY (VATS) with LEFT UPPER LOBECTOMY (Left) Subjective: Some pain from CT Denies nausea  Objective: Vital signs in last 24 hours: Temp:  [98.2 F (36.8 C)-98.7 F (37.1 C)] 98.2 F (36.8 C) (04/13 0718) Pulse Rate:  [91-133] 106 (04/13 0700) Cardiac Rhythm:  [-] Sinus tachycardia (04/13 0722) Resp:  [11-28] 16 (04/13 0718) BP: (111-150)/(69-90) 128/85 mmHg (04/13 0400) SpO2:  [95 %-100 %] 97 % (04/13 0718) Weight:  [194 lb 0.1 oz (88 kg)-197 lb 5 oz (89.5 kg)] 194 lb 0.1 oz (88 kg) (04/13 0600)  Hemodynamic parameters for last 24 hours:    Intake/Output from previous day: 04/12 0701 - 04/13 0700 In: 2740 [P.O.:1440; I.V.:1250; IV Piggyback:50] Out: 3080 [Urine:2590; Chest Tube:490] Intake/Output this shift:    General appearance: alert and no distress Neurologic: intact Heart: regular rate and rhythm Lungs: diminished breath sounds left base Abdomen: distended, tympanitic, + BS small air leak with repeateed cough  Lab Results:  Recent Labs  09/10/14 0500 09/11/14 0425  WBC 13.3* 11.8*  HGB 11.7* 11.3*  HCT 33.7* 32.9*  PLT 185 168   BMET:  Recent Labs  09/10/14 0500 09/11/14 0425  NA 135 134*  K 4.1 3.8  CL 100 99  CO2 27 27  GLUCOSE 140* 124*  BUN 8 7  CREATININE 0.74 0.82  CALCIUM 8.6 8.3*    PT/INR: No results for input(s): LABPROT, INR in the last 72 hours. ABG    Component Value Date/Time   PHART 7.425 09/10/2014 0538   HCO3 29.2* 09/10/2014 0538   TCO2 31 09/10/2014 0538   O2SAT 98.0 09/10/2014 0538   CBG (last 3)   Recent Labs  09/10/14 1205 09/10/14 1653 09/10/14 2156  GLUCAP 144* 146* 136*    Assessment/Plan: S/P Procedure(s) (LRB): LEFT VIDEO ASSISTED THORACOSCOPY (VATS) with LEFT UPPER LOBECTOMY (Left) -  POD # 2 doing well  Still has a small air leak but decreased from yesterday CXR shows a small space and significant gastric dilatation Reglan for gastric  ileus CBG mildly elevated- change to 1/2 NS  Continue ambulation SCD + enoxaparin for DVT prophylaxis  Transfer to 3S when bed available  LOS: 2 days    Melrose Nakayama 09/11/2014

## 2014-09-12 ENCOUNTER — Inpatient Hospital Stay (HOSPITAL_COMMUNITY): Payer: Medicaid Other

## 2014-09-12 ENCOUNTER — Encounter (HOSPITAL_COMMUNITY): Payer: Self-pay | Admitting: Thoracic Surgery (Cardiothoracic Vascular Surgery)

## 2014-09-12 LAB — GLUCOSE, CAPILLARY
GLUCOSE-CAPILLARY: 129 mg/dL — AB (ref 70–99)
Glucose-Capillary: 114 mg/dL — ABNORMAL HIGH (ref 70–99)
Glucose-Capillary: 111 mg/dL — ABNORMAL HIGH (ref 70–99)
Glucose-Capillary: 114 mg/dL — ABNORMAL HIGH (ref 70–99)

## 2014-09-12 LAB — BASIC METABOLIC PANEL
Anion gap: 9 (ref 5–15)
BUN: 6 mg/dL (ref 6–23)
CO2: 28 mmol/L (ref 19–32)
Calcium: 8.7 mg/dL (ref 8.4–10.5)
Chloride: 98 mmol/L (ref 96–112)
Creatinine, Ser: 0.76 mg/dL (ref 0.50–1.35)
Glucose, Bld: 130 mg/dL — ABNORMAL HIGH (ref 70–99)
POTASSIUM: 4.1 mmol/L (ref 3.5–5.1)
Sodium: 135 mmol/L (ref 135–145)

## 2014-09-12 MED ORDER — IPRATROPIUM-ALBUTEROL 0.5-2.5 (3) MG/3ML IN SOLN: 3.0000 mL | Freq: Four times a day (QID) | RESPIRATORY_TRACT | Status: DC | PRN

## 2014-09-12 MED ORDER — GUAIFENESIN ER 600 MG PO TB12
1200.0000 mg | ORAL_TABLET | Freq: Two times a day (BID) | ORAL | Status: DC
  Administered 2014-09-12 – 2014-09-13 (×4): 1200 mg via ORAL
  Filled 2014-09-12 (×7): qty 2

## 2014-09-12 MED ORDER — PNEUMOCOCCAL VAC POLYVALENT 25 MCG/0.5ML IJ INJ: 0.5000 mL | INJECTION | INTRAMUSCULAR | Status: DC

## 2014-09-12 MED ORDER — METOPROLOL SUCCINATE ER 25 MG PO TB24
25.0000 mg | ORAL_TABLET | Freq: Every day | ORAL | Status: DC
  Administered 2014-09-12 – 2014-09-13 (×2): 25 mg via ORAL
  Filled 2014-09-12 (×3): qty 1

## 2014-09-12 NOTE — Progress Notes (Signed)
Pt ambulated 350 in hall with rolling walker at steady pace. Max HR 129. Pt tolerated without shortness of breath or discomfort. Back to chair, call bell in reach.

## 2014-09-12 NOTE — Progress Notes (Signed)
3 Days Post-Op Procedure(s) (LRB): LEFT VIDEO ASSISTED THORACOSCOPY (VATS) with LEFT UPPER LOBECTOMY (Left) Subjective: Some incisional pain- well controlled Denies nausea, appetite fair  Objective: Vital signs in last 24 hours: Temp:  [98.3 F (36.8 C)-99 F (37.2 C)] 98.4 F (36.9 C) (04/14 0727) Pulse Rate:  [101-128] 114 (04/14 0700) Cardiac Rhythm:  [-] Sinus tachycardia (04/14 0400) Resp:  [12-28] 23 (04/14 0700) BP: (110-137)/(55-99) 137/85 mmHg (04/14 0400) SpO2:  [93 %-100 %] 95 % (04/14 0700) Weight:  [192 lb 14.4 oz (87.5 kg)] 192 lb 14.4 oz (87.5 kg) (04/14 0500)  Hemodynamic parameters for last 24 hours:    Intake/Output from previous day: 04/13 0701 - 04/14 0700 In: 2530 [P.O.:1380; I.V.:1150] Out: 2725 [Urine:2525; Chest Tube:200] Intake/Output this shift:    General appearance: alert and no distress Neurologic: intact Heart: tachy, regular Lungs: diminished breath sounds left base Abdomen: normal findings: soft, non-tender no air leak, serous drainage  Lab Results:  Recent Labs  09/10/14 0500 09/11/14 0425  WBC 13.3* 11.8*  HGB 11.7* 11.3*  HCT 33.7* 32.9*  PLT 185 168   BMET:  Recent Labs  09/11/14 0425 09/12/14 0320  NA 134* 135  K 3.8 4.1  CL 99 98  CO2 27 28  GLUCOSE 124* 130*  BUN 7 6  CREATININE 0.82 0.76  CALCIUM 8.3* 8.7    PT/INR: No results for input(s): LABPROT, INR in the last 72 hours. ABG    Component Value Date/Time   PHART 7.425 09/10/2014 0538   HCO3 29.2* 09/10/2014 0538   TCO2 31 09/10/2014 0538   O2SAT 98.0 09/10/2014 0538   CBG (last 3)   Recent Labs  09/11/14 1121 09/11/14 1548 09/11/14 2138  GLUCAP 118* 136* 112*    Assessment/Plan: S/P Procedure(s) (LRB): LEFT VIDEO ASSISTED THORACOSCOPY (VATS) with LEFT UPPER LOBECTOMY (Left) Plan for transfer to step-down: see transfer orders   Still awaiting step down bed  Persistently tachycardic and BP mildly elevated- will start low dose  metoprolol  PATH- T1a,N1- stage IIA- d/w patient  CBG well controlled  No air leak- dc anterior CT, can probably dc posterior CT tomorrow  SCD + enoxaparin for DVT prophylaxis   LOS: 3 days    Melrose Nakayama 09/12/2014

## 2014-09-12 NOTE — Progress Notes (Signed)
Pt received to room 2W20. Assisted to chair. Oriented to room and call system. Left chest tube in place to water seal without air leak. Pt denies pain or needs. Call bell in reach.

## 2014-09-13 ENCOUNTER — Inpatient Hospital Stay (HOSPITAL_COMMUNITY): Payer: Medicaid Other

## 2014-09-13 LAB — CBC
HEMATOCRIT: 32.5 % — AB (ref 39.0–52.0)
HEMOGLOBIN: 11.2 g/dL — AB (ref 13.0–17.0)
MCH: 27.5 pg (ref 26.0–34.0)
MCHC: 34.5 g/dL (ref 30.0–36.0)
MCV: 79.7 fL (ref 78.0–100.0)
Platelets: 166 10*3/uL (ref 150–400)
RBC: 4.08 MIL/uL — ABNORMAL LOW (ref 4.22–5.81)
RDW: 14.9 % (ref 11.5–15.5)
WBC: 12.8 10*3/uL — ABNORMAL HIGH (ref 4.0–10.5)

## 2014-09-13 LAB — GLUCOSE, CAPILLARY
GLUCOSE-CAPILLARY: 94 mg/dL (ref 70–99)
Glucose-Capillary: 108 mg/dL — ABNORMAL HIGH (ref 70–99)
Glucose-Capillary: 148 mg/dL — ABNORMAL HIGH (ref 70–99)

## 2014-09-13 NOTE — Progress Notes (Signed)
Pt ambulated in hall with standby 450 feet. Pt tolerated well without dyspnea or complaints.

## 2014-09-13 NOTE — Progress Notes (Addendum)
      [address]       [place], [address]             [phone]      4 Days Post-Op Procedure(s) (LRB): LEFT VIDEO ASSISTED THORACOSCOPY (VATS) with LEFT UPPER LOBECTOMY (Left)   Subjective:  Larry [name] is doing well this morning.  States his pain control is better after chest tube removal yesterday.  He requests discontinuing PCA because he is barely using it.  He is tolerating oral intake.  Objective: Vital signs in last 24 hours: Temp:  [98 F (36.7 C)-99.1 F (37.3 C)] 98.3 F (36.8 C) (04/15 0612) Pulse Rate:  [99-121] 105 (04/15 0612) Cardiac Rhythm:  [-] Sinus tachycardia (04/15 0741) Resp:  [15-28] 20 (04/15 0741) BP: (112-128)/(74-90) 119/90 mmHg (04/15 0612) SpO2:  [93 %-98 %] 98 % (04/15 0741) Weight:  [187 lb 9.6 oz (85.095 kg)] 187 lb 9.6 oz (85.095 kg) (04/15 0612)  Intake/Output from previous day: 04/14 0701 - 04/15 0700 In: 613 [P.O.:360; I.V.:253] Out: 3262 [Urine:2951; Stool:1; Chest Tube:310] Intake/Output this shift: Total I/O In: -  Out: 30 [Chest Tube:30]  General appearance: alert, cooperative and no distress Heart: regular rate and rhythm and tach Lungs: diminished breath sounds left base Abdomen: soft, non-tender; bowel sounds normal; no masses,  no organomegaly Wound: clean and dry  Lab Results:  Recent Labs  09/11/14 0425 09/13/14 0516  WBC 11.8* 12.8*  HGB 11.3* 11.2*  HCT 32.9* 32.5*  PLT 168 166   BMET:  Recent Labs  09/11/14 0425 09/12/14 0320  NA 134* 135  K 3.8 4.1  CL 99 98  CO2 27 28  GLUCOSE 124* 130*  BUN 7 6  CREATININE 0.82 0.76  CALCIUM 8.3* 8.7    PT/INR: No results for input(s): LABPROT, INR in the last 72 hours. ABG    Component Value Date/Time   PHART 7.425 09/10/2014 0538   HCO3 29.2* 09/10/2014 0538   TCO2 31 09/10/2014 0538   O2SAT 98.0 09/10/2014 0538   CBG (last 3)   Recent Labs  09/12/14 1629 09/12/14 2140 09/13/14 0633  GLUCAP 111* 114* 108*     Assessment/Plan: S/P Procedure(s) (LRB): LEFT VIDEO ASSISTED THORACOSCOPY (VATS) with LEFT UPPER LOBECTOMY (Left)  1. CV- remains tachy, continue Lopressor, however may benefit from 25 mg BID 2. Pulm- off oxygen, CXR stable, trace pneumothorax along lateral wall on left 3. Chest tube- no definitive air leak present, some tidaling with cough 4. CBGs controlled, no documented history of DM, will d/c SSIP and glucose checks 5. Dispo- patient stable, likely remove final chest tube today, watch HR   LOS: 4 days    BARRETT, ERIN 09/13/2014  Patient seen and examined, agree with above No air leak and drainage down - dc CT Home in AM if continues to progress  Remo Lipps C. Roxan Hockey, MD [facility] [phone]

## 2014-09-13 NOTE — Progress Notes (Signed)
Chest x ray results called to Rosaria Ferries, PA.

## 2014-09-13 NOTE — Progress Notes (Signed)
Pt ambulated 250 feet in hall with standby. He tolerated well with no dyspnea or complaints.

## 2014-09-13 NOTE — Progress Notes (Signed)
Utilization review completed.  

## 2014-09-13 NOTE — Discharge Summary (Signed)
[address]       [place] [address]             [phone]              Discharge Summary  Name: Larry [name] DOB: [birth year] 64 y.o. MRN: [id number]   Admission Date: 09/09/2014 Discharge Date: 09/14/2014    Admitting Diagnosis: Squamous cell carcinoma, left upper lobe   Discharge Diagnosis:  Invasive squamous cell carcinoma, left upper lobe (T1a, N1) Mild postoperative ileus, resolved  Past Medical History  Diagnosis Date  . Hyperlipidemia   . COPD (chronic obstructive pulmonary disease)   . Non-small cell carcinoma of left lung   . Shortness of breath dyspnea   . Cough      Procedures: LEFT VIDEO ASSISTED THORACOSCOPY - 09/09/2014  LEFT UPPER LOBECTOMY   MEDIASTINAL LYMPH NODE DISSECTION    HPI:  The patient is a 64 y.o. male who was referred to Dr. Roxan Hockey in January 2016 for evaluation of a left upper lobe lung cancer.  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 He saw Dr. Gwenette Greet in July and a chest x-ray showed a prominent left hilum. A CT was done in August which showed a left hilar mass 2.0 x 2.4 cm. That led to a PET/CT which showed the left hilar mass to be hypermetabolic and also showed a hypermetabolic lesion in the superior segment of the RLL. This was felt concerning for stage IV lung cancer.   A bronch/ EBUS was done in early November, but was nondiagnostic. 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 Of note, on the new CT the right lower lobe opacity had nearly completely resolved. In light of this finding, he no longer had stage 4 disease.  His case was discussed at [facility] and the patient underwent 3 cycles of chemotherapy with Carboplatin and Taxol. He tolerated that well for the most part, but did develop hives about 2 weeks after his last cycle of chemotherapy. He had a repeat CT of the chest which showed a decrease in the size of the left hilar mass.  There also was a small right lower lobe nodule that decreased in size from 31mm to 3 mm. It is unclear if this represents a second primary.  He continues to smoke.  He returned to the office to see Dr. Roxan Hockey recently and it was felt that he should undergo surgical resection at this time. All risks, benefits and alternatives of surgery were explained in detail, and the patient agreed to proceed.    Hospital Course:  The patient was admitted to [facility] on 09/09/2014. The patient was taken to the operating room and underwent the above procedure.    The postoperative course has generally been uneventful.  He did have a mild ileus which was treated conservatively and resolved.  Chest tubes have been removed in the standard fashion and follow up chest x-rays have remained stable.  He has had some tachycardia and mild hypertension and was started on a low dose beta blocker. Incisions are healing well.  He is ambulating in the halls without difficulty and is tolerating a regular diet.  Final pathology revealed invasive squamous cell carcinoma (T1a,N1). The patient is overall progressing well and is medically stable for discharge home on today's date.     Recent vital signs:  Filed Vitals:   09/14/14 0520  BP:  104/68  Pulse: 105  Temp: 98.6 F (37 C)  Resp:     Recent laboratory studies:  CBC:  Recent Labs  09/13/14 0516  WBC 12.8*  HGB 11.2*  HCT 32.5*  PLT 166   BMET:   Recent Labs  09/12/14 0320  NA 135  K 4.1  CL 98  CO2 28  GLUCOSE 130*  BUN 6  CREATININE 0.76  CALCIUM 8.7    PT/INR: No results for input(s): LABPROT, INR in the last 72 hours.   Discharge Medications:     Medication List    TAKE these medications        atorvastatin 80 MG tablet  Commonly known as:  LIPITOR  Take 80 mg by mouth daily.     budesonide-formoterol 160-4.5 MCG/ACT inhaler  Commonly known as:  SYMBICORT  Inhale 2 puffs into the lungs 2 (two) times daily.     FISH OIL PO    Take 1 capsule by mouth daily.     metoprolol succinate 25 MG 24 hr tablet  Commonly known as:  TOPROL-XL  Take 1 tablet (25 mg total) by mouth daily.     oxyCODONE 5 MG immediate release tablet  Commonly known as:  Oxy IR/ROXICODONE  Take 1-2 tablets (5-10 mg total) by mouth every 4 (four) hours as needed for severe pain.         Discharge Instructions:  The patient is to refrain from driving, heavy lifting or strenuous activity.  May shower daily and clean incisions with soap and water.  May resume regular diet.   Follow Up: Follow-up Information    Follow up with Melrose Nakayama, MD On 10/08/2014.   Specialty:  Cardiothoracic Surgery   Why:  Have a chest x-ray at [facility] (first floor) at 9:00, then see MD at 10:00   Contact information:   [address] [place] [address] [phone]       Follow up with [facility] RN On 09/20/2014.   Why:  For suture removal at 11:00         [place] 09/14/2014, 9:03 AM

## 2014-09-13 NOTE — Progress Notes (Signed)
Chest tube removed pre protocol. Suture tied securely, vaseline dressing applied. VSS. Patient tolerated well. No dyspnea or pain.

## 2014-09-14 ENCOUNTER — Inpatient Hospital Stay (HOSPITAL_COMMUNITY): Payer: Medicaid Other

## 2014-09-14 LAB — GLUCOSE, CAPILLARY: Glucose-Capillary: 129 mg/dL — ABNORMAL HIGH (ref 70–99)

## 2014-09-14 MED ORDER — METOPROLOL SUCCINATE ER 25 MG PO TB24: 25.0000 mg | ORAL_TABLET | Freq: Every day | ORAL | Status: DC

## 2014-09-14 MED ORDER — OXYCODONE HCL 5 MG PO TABS: 5.0000 mg | ORAL_TABLET | ORAL | Status: DC | PRN

## 2014-09-14 NOTE — Progress Notes (Signed)
       [address]       [place] [address]             [phone]          5 Days Post-Op Procedure(s) (LRB): LEFT VIDEO ASSISTED THORACOSCOPY (VATS) with LEFT UPPER LOBECTOMY (Left)  Subjective: Feels well, no complaints.   Objective: Vital signs in last 24 hours: Patient Vitals for the past 24 hrs:  BP Temp Temp src Pulse Resp SpO2 Weight  09/14/14 0520 104/68 mmHg 98.6 F (37 C) Oral (!) 105 - 100 % 186 lb 4.6 oz (84.5 kg)  09/13/14 2010 (!) 111/93 mmHg 98.8 F (37.1 C) Oral 98 19 98 % -  09/13/14 1500 119/87 mmHg 97.7 F (36.5 C) Oral 88 18 99 % -  09/13/14 0941 127/90 mmHg - - (!) 106 20 96 % -  09/13/14 0923 110/79 mmHg - - (!) 106 20 95 % -  09/13/14 0909 116/80 mmHg - - (!) 111 20 97 % -   Current Weight  09/14/14 186 lb 4.6 oz (84.5 kg)     Intake/Output from previous day: 04/15 0701 - 04/16 0700 In: 240 [P.O.:240] Out: 30 [Chest Tube:30]    PHYSICAL EXAM:  Heart: RRR Lungs: Slightly decreased BS on L Wound: Clean and dry     Lab Results: CBC: Recent Labs  09/13/14 0516  WBC 12.8*  HGB 11.2*  HCT 32.5*  PLT 166   BMET:  Recent Labs  09/12/14 0320  NA 135  K 4.1  CL 98  CO2 28  GLUCOSE 130*  BUN 6  CREATININE 0.76  CALCIUM 8.7    PT/INR: No results for input(s): LABPROT, INR in the last 72 hours.  CXR: FINDINGS: Cardiac shadow is within normal limits. Postsurgical changes are again noted on the left with mild volume loss. A small left pneumothorax is again identified but stable. The right lung remains clear. No bony abnormality is noted.  IMPRESSION: Stable left apical pneumothorax.    Assessment/Plan: S/P Procedure(s) (LRB): LEFT VIDEO ASSISTED THORACOSCOPY (VATS) with LEFT UPPER LOBECTOMY (Left) CXR stable, pt otherwise doing well. Plan d/c home today- instructions reviewed with patient.    LOS: 5 days    Dewell Monnier H 09/14/2014

## 2014-09-14 NOTE — Progress Notes (Signed)
Discharged to home with family office visits in place teaching done  

## 2014-09-20 ENCOUNTER — Ambulatory Visit (INDEPENDENT_AMBULATORY_CARE_PROVIDER_SITE_OTHER): Payer: Self-pay | Admitting: *Deleted

## 2014-09-20 DIAGNOSIS — C3412 Malignant neoplasm of upper lobe, left bronchus or lung: Secondary | ICD-10-CM

## 2014-09-20 DIAGNOSIS — Z902 Acquired absence of lung [part of]: Secondary | ICD-10-CM

## 2014-09-20 DIAGNOSIS — C3492 Malignant neoplasm of unspecified part of left bronchus or lung: Secondary | ICD-10-CM

## 2014-09-20 DIAGNOSIS — Z4802 Encounter for removal of sutures: Secondary | ICD-10-CM

## 2014-09-20 NOTE — Progress Notes (Signed)
Larry [name] returns for suture removal of two previous chest tube site sutures. These were easily removed and without discomfort. These sites as well as the small Vats incision are healing well with glue remaining.  He does not relate any problems.  He will return as scheduled to see Dr. Roxan Hockey with a chest xray.

## 2014-09-24 ENCOUNTER — Telehealth: Payer: Self-pay | Admitting: Internal Medicine

## 2014-09-24 ENCOUNTER — Other Ambulatory Visit: Payer: Self-pay | Admitting: *Deleted

## 2014-09-24 NOTE — Telephone Encounter (Signed)
no answer....mailed pt appt sched and letter °

## 2014-10-04 ENCOUNTER — Other Ambulatory Visit: Payer: Self-pay | Admitting: Thoracic Surgery (Cardiothoracic Vascular Surgery)

## 2014-10-07 ENCOUNTER — Other Ambulatory Visit: Payer: Self-pay | Admitting: Thoracic Surgery (Cardiothoracic Vascular Surgery)

## 2014-10-07 DIAGNOSIS — C3412 Malignant neoplasm of upper lobe, left bronchus or lung: Secondary | ICD-10-CM

## 2014-10-08 ENCOUNTER — Ambulatory Visit (INDEPENDENT_AMBULATORY_CARE_PROVIDER_SITE_OTHER): Payer: Self-pay | Admitting: Thoracic Surgery (Cardiothoracic Vascular Surgery)

## 2014-10-08 ENCOUNTER — Encounter: Payer: Self-pay | Admitting: Thoracic Surgery (Cardiothoracic Vascular Surgery)

## 2014-10-08 ENCOUNTER — Ambulatory Visit
Admission: RE | Admit: 2014-10-08 | Discharge: 2014-10-08 | Disposition: A | Discharge status: Home / Self Care | Payer: Medicaid Other | Source: Ambulatory Visit | Attending: Thoracic Surgery (Cardiothoracic Vascular Surgery) | Admitting: Thoracic Surgery (Cardiothoracic Vascular Surgery)

## 2014-10-08 VITALS — BP 115/82 | HR 84 | Resp 16 | Ht 74.0 in | Wt 192.0 lb

## 2014-10-08 DIAGNOSIS — C3412 Malignant neoplasm of upper lobe, left bronchus or lung: Secondary | ICD-10-CM

## 2014-10-08 DIAGNOSIS — C3492 Malignant neoplasm of unspecified part of left bronchus or lung: Secondary | ICD-10-CM

## 2014-10-08 DIAGNOSIS — Z902 Acquired absence of lung [part of]: Secondary | ICD-10-CM

## 2014-10-08 DIAGNOSIS — Z9889 Other specified postprocedural states: Secondary | ICD-10-CM

## 2014-10-08 NOTE — Progress Notes (Addendum)
      [address]       [place], [address]             [phone]       HPI:  Larry [name] returns for a scheduled postoperative follow-up visit.  He is a 64 year old man who had a stage II a non-small cell lung cancer. He was treated with neoadjuvant chemotherapy with carboplatin and paclitaxel and then underwent a thoracoscopic left upper lobectomy and mediastinal lymph node dissection on 09/09/2014.  His postoperative course was uncomplicated and he was discharged home on postoperative day #5.  He says he is feeling well. He does not have any problems with shortness of breath. He has minimal pain and is not taking any pain medication. Says he took about 4 pain pills after he went home and hasn't needed one since. He does complain of a hoarse voice.  Past Medical History  Diagnosis Date  . Hyperlipidemia   . COPD (chronic obstructive pulmonary disease)   . Non-small cell carcinoma of left lung   . Shortness of breath dyspnea   . Cough       Current Outpatient Prescriptions  Medication Sig Dispense Refill  . atorvastatin (LIPITOR) 80 MG tablet Take 80 mg by mouth daily.    . budesonide-formoterol (SYMBICORT) 160-4.5 MCG/ACT inhaler Inhale 2 puffs into the lungs 2 (two) times daily. 1 Inhaler 11  . metoprolol succinate (TOPROL-XL) 25 MG 24 hr tablet Take 1 tablet (25 mg total) by mouth daily. 30 tablet 1   No current facility-administered medications for this visit.    Physical Exam BP 115/82 mmHg  Pulse 84  Resp 16  Ht '6\' 2"'$  (1.88 m)  Wt 192 lb (87.091 kg)  BMI 24.64 kg/m2  SpO38 1% 64 year old male in no acute distress Alert and oriented 3 with no focal motor deficit Hoarse Lungs diminished at left base, otherwise clear Cardiac regular rate and rhythm normal S1 and S2 Incisions healing well  Diagnostic Tests: Chest x-ray shows postoperative changes on the left. There is no evidence of active disease.  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 He received neoadjuvant chemotherapy with carboplatin and paclitaxel. He had a good response to treatment prior to surgery.  From a surgical standpoint he is doing well. He does have hoarseness which could be related to recurrent nerve dysfunction due to the lymph node dissection. That may improve with time. We'll give him a few months and see if there is any improvement, if not, we will consider referral to otorhinolaryngology.  He is not having any significant pain and is not requiring any narcotics.  He has a follow-up appointment with Dr. Julien Nordmann later this month.  Plan: I will see Mr. Helfman back in 2 months. We will do a PA and lateral chest x-ray at that time.  Melrose Nakayama, MD [facility] [phone]    I did question him about smoking cessation. He says that he is still smoking "now and then."  I emphasized the importance of complete tobacco cessation and gave him our quit smoking information card.  Smoking cessation instruction/counseling given:  counseled patient on the dangers of tobacco use, advised patient to stop smoking, and reviewed strategies to maximize success   Remo Lipps C. Roxan Hockey, MD [facility] [phone]

## 2014-10-22 ENCOUNTER — Encounter: Payer: Self-pay | Admitting: *Deleted

## 2014-10-22 ENCOUNTER — Encounter: Payer: Self-pay | Admitting: Internal Medicine

## 2014-10-22 ENCOUNTER — Telehealth: Payer: Self-pay | Admitting: Internal Medicine

## 2014-10-22 ENCOUNTER — Other Ambulatory Visit (HOSPITAL_BASED_OUTPATIENT_CLINIC_OR_DEPARTMENT_OTHER): Payer: Medicaid Other

## 2014-10-22 ENCOUNTER — Ambulatory Visit (HOSPITAL_BASED_OUTPATIENT_CLINIC_OR_DEPARTMENT_OTHER): Payer: Medicaid Other | Admitting: Internal Medicine

## 2014-10-22 VITALS — BP 133/83 | HR 73 | Temp 98.6°F | Resp 19 | Ht 74.0 in | Wt 192.1 lb

## 2014-10-22 DIAGNOSIS — C3402 Malignant neoplasm of left main bronchus: Secondary | ICD-10-CM

## 2014-10-22 DIAGNOSIS — C3492 Malignant neoplasm of unspecified part of left bronchus or lung: Secondary | ICD-10-CM

## 2014-10-22 LAB — CBC WITH DIFFERENTIAL/PLATELET
BASO%: 0.3 % (ref 0.0–2.0)
BASOS ABS: 0 10*3/uL (ref 0.0–0.1)
EOS%: 4.2 % (ref 0.0–7.0)
Eosinophils Absolute: 0.3 10*3/uL (ref 0.0–0.5)
HCT: 37.1 % — ABNORMAL LOW (ref 38.4–49.9)
HEMOGLOBIN: 12.8 g/dL — AB (ref 13.0–17.1)
LYMPH%: 31.7 % (ref 14.0–49.0)
MCH: 27.6 pg (ref 27.2–33.4)
MCHC: 34.5 g/dL (ref 32.0–36.0)
MCV: 80 fL (ref 79.3–98.0)
MONO#: 0.6 10*3/uL (ref 0.1–0.9)
MONO%: 8.3 % (ref 0.0–14.0)
NEUT#: 3.8 10*3/uL (ref 1.5–6.5)
NEUT%: 55.5 % (ref 39.0–75.0)
PLATELETS: 224 10*3/uL (ref 140–400)
RBC: 4.64 10*6/uL (ref 4.20–5.82)
RDW: 14.7 % — AB (ref 11.0–14.6)
WBC: 6.8 10*3/uL (ref 4.0–10.3)
lymph#: 2.2 10*3/uL (ref 0.9–3.3)

## 2014-10-22 LAB — COMPREHENSIVE METABOLIC PANEL (CC13)
ALK PHOS: 88 U/L (ref 40–150)
ALT: 9 U/L (ref 0–55)
AST: 17 U/L (ref 5–34)
Albumin: 3.7 g/dL (ref 3.5–5.0)
Anion Gap: 11 mEq/L (ref 3–11)
BUN: 6.9 mg/dL — AB (ref 7.0–26.0)
CALCIUM: 9 mg/dL (ref 8.4–10.4)
CHLORIDE: 104 meq/L (ref 98–109)
CO2: 23 meq/L (ref 22–29)
CREATININE: 0.8 mg/dL (ref 0.7–1.3)
Glucose: 96 mg/dl (ref 70–140)
Potassium: 4 mEq/L (ref 3.5–5.1)
Sodium: 138 mEq/L (ref 136–145)
Total Bilirubin: 0.35 mg/dL (ref 0.20–1.20)
Total Protein: 7.4 g/dL (ref 6.4–8.3)

## 2014-10-22 NOTE — Progress Notes (Signed)
[place] Telephone:([phone]   Fax:(336) [phone]  OFFICE PROGRESS NOTE  Larry Pillow, NP [facility] [place] [address]  DIAGNOSIS: Stage IIA (T1a, N1, M0) non-small cell lung cancer, squamous cell carcinoma diagnosed in November 2015, presented with left suprahilar soft tissue mass.  PRIOR THERAPY:  1) Neoadjuvant systemic chemotherapy with carboplatin for AUC of 5 and paclitaxel 175 MG/M2 every 3 weeks with Neulasta support. First dose 06/13/2014. Status post 3 cycle. 2) Status post Left video-assisted thoracoscopy, Thoracoscopic left upper lobectomy, Mediastinal lymph node dissection under the care of Dr. Roxan Hockey on 09/09/2014.   CURRENT THERAPY: Observation.  INTERVAL HISTORY: Larry [name] 64 y.o. male returns to the clinic today for follow. The patient is feeling fine today with no specific complaints. He underwent left upper lobectomy with mediastinal lymph node dissection under the care of Dr. Roxan Hockey on 09/09/2014 and the final pathology (Accession: [id number]) showed invasive squamous cell carcinoma, poorly differentiated measuring 1.9 cm with negative resection margin but there was evidence for metastatic squamous cell carcinoma to 1 lymph node at 11L. the patient is recovering from his surgery fairly well. He denied having any significant nausea or vomiting. The patient denied having any significant chest pain, shortness breath with exertion with no cough or hemoptysis. He denied having any significant weight loss or night sweats. He is here today for evaluation and discussion of his treatment options.  MEDICAL HISTORY: Past Medical History  Diagnosis Date  . Hyperlipidemia   . COPD (chronic obstructive pulmonary disease)   . Non-small cell carcinoma of left lung   . Shortness of breath dyspnea   . Cough   . Lung cancer, upper lobe     Stage II a non-small cell carcinoma left upper lobe     ALLERGIES:  has No Known Allergies.  MEDICATIONS:  Current Outpatient Prescriptions  Medication Sig Dispense Refill  . atorvastatin (LIPITOR) 80 MG tablet Take 80 mg by mouth daily.    . budesonide-formoterol (SYMBICORT) 160-4.5 MCG/ACT inhaler Inhale 2 puffs into the lungs 2 (two) times daily. 1 Inhaler 11  . metoprolol succinate (TOPROL-XL) 25 MG 24 hr tablet Take 1 tablet (25 mg total) by mouth daily. 30 tablet 1  . oxyCODONE (OXY IR/ROXICODONE) 5 MG immediate release tablet   0   No current facility-administered medications for this visit.    SURGICAL HISTORY:  Past Surgical History  Procedure Laterality Date  . Colonoscopy    . Endobronchial ultrasound Bilateral 04/08/2014    Procedure: ENDOBRONCHIAL ULTRASOUND;  Surgeon: Kathee Delton, MD;  Location: [facility];  Service: Cardiopulmonary;  Laterality: Bilateral;  . Video bronchoscopy with endobronchial navigation Right 04/24/2014    Procedure: VIDEO BRONCHOSCOPY WITH ENDOBRONCHIAL NAVIGATION;  Surgeon: Collene Gobble, MD;  Location: [facility];  Service: Thoracic;  Laterality: Right;  . Video bronchoscopy with endobronchial ultrasound Right 04/24/2014    Procedure: VIDEO BRONCHOSCOPY WITH ENDOBRONCHIAL ULTRASOUND;  Surgeon: Collene Gobble, MD;  Location: [facility];  Service: Thoracic;  Laterality: Right;  . Video assisted thoracoscopy (vats)/ lobectomy Left 09/09/2014    Procedure: LEFT VIDEO ASSISTED THORACOSCOPY (VATS) with LEFT UPPER LOBECTOMY;  Surgeon: Melrose Nakayama, MD;  Location: [facility];  Service: Thoracic;  Laterality: Left;    REVIEW OF SYSTEMS:  Constitutional: positive for fatigue Eyes: negative Ears, nose, mouth, throat, and face: negative Respiratory: positive for dyspnea on exertion Cardiovascular: negative Gastrointestinal: negative Genitourinary:negative Integument/breast: negative Hematologic/lymphatic: negative Musculoskeletal:negative  Neurological: negative Behavioral/Psych: negative Endocrine:  negative Allergic/Immunologic: negative   PHYSICAL EXAMINATION: General appearance: alert, cooperative, fatigued and no distress Head: Normocephalic, without obvious abnormality, atraumatic Neck: no adenopathy, no JVD, supple, symmetrical, trachea midline and thyroid not enlarged, symmetric, no tenderness/mass/nodules Lymph nodes: Cervical, supraclavicular, and axillary nodes normal. Resp: clear to auscultation bilaterally Back: symmetric, no curvature. ROM normal. No CVA tenderness. Cardio: regular rate and rhythm, S1, S2 normal, no murmur, click, rub or gallop GI: soft, non-tender; bowel sounds normal; no masses,  no organomegaly Extremities: extremities normal, atraumatic, no cyanosis or edema Neurologic: Alert and oriented X 3, normal strength and tone. Normal symmetric reflexes. Normal coordination and gait  ECOG PERFORMANCE STATUS: 1 - Symptomatic but completely ambulatory  Blood pressure 133/83, pulse 73, temperature 98.6 F (37 C), temperature source Oral, resp. rate 19, height '6\' 2"'$  (1.88 m), weight 192 lb 1.6 oz (87.136 kg), SpO2 100 %.  LABORATORY DATA: Lab Results  Component Value Date   WBC 6.8 10/22/2014   HGB 12.8* 10/22/2014   HCT 37.1* 10/22/2014   MCV 80.0 10/22/2014   PLT 224 10/22/2014      Chemistry      Component Value Date/Time   NA 138 10/22/2014 1020   NA 135 09/12/2014 0320   K 4.0 10/22/2014 1020   K 4.1 09/12/2014 0320   CL 98 09/12/2014 0320   CO2 23 10/22/2014 1020   CO2 28 09/12/2014 0320   BUN 6.9* 10/22/2014 1020   BUN 6 09/12/2014 0320   CREATININE 0.8 10/22/2014 1020   CREATININE 0.76 09/12/2014 0320      Component Value Date/Time   CALCIUM 9.0 10/22/2014 1020   CALCIUM 8.7 09/12/2014 0320   ALKPHOS 88 10/22/2014 1020   ALKPHOS 62 09/11/2014 0425   AST 17 10/22/2014 1020   AST 26 09/11/2014 0425   ALT 9 10/22/2014 1020   ALT 17 09/11/2014 0425   BILITOT 0.35 10/22/2014 1020   BILITOT 0.7 09/11/2014 0425       RADIOGRAPHIC  STUDIES: Dg Chest 2 View  10/08/2014   CLINICAL DATA:  Lung cancer.  Lung surgery.  EXAM: CHEST  2 VIEW  COMPARISON:  09/14/2014.  09/13/2014.  FINDINGS: Mediastinum hilar structures are normal. Postsurgical changes left lung again noted. Lungs are clear of acute infiltrates. Heart size normal. No acute bony abnormality.  IMPRESSION: Postsurgical changes left lung.  No acute cardiopulmonary disease.   Electronically Signed   By: Marcello Moores  Register   On: 10/08/2014 09:43    ASSESSMENT AND PLAN: This is a very pleasant 64 years old African-American male with potentially resectable stage IIA non-small cell lung cancer, squamous cell carcinoma. He is a status post neoadjuvant systemic chemotherapy with carboplatin and paclitaxel for 3 cycles followed by left upper lobectomy with lymph node dissection. The patient is doing fine and recovering well from his surgery. I had a lengthy discussion with the patient and his family member today about his current condition and treatment options. I explained to the patient that since he received neoadjuvant systemic chemotherapy, I don't see a need for him to be treated with a course of adjuvant chemotherapy again. I recommended for the patient quit monitoring with repeat CT scan of the chest in 3 months. He was advised to call immediately if he has any concerning symptoms in the interval. The patient voices understanding of current disease status and treatment options and is in agreement with the current care plan.  All questions were answered. The patient knows  to call the clinic with any problems, questions or concerns. We can certainly see the patient much sooner if necessary.  Disclaimer: This note was dictated with voice recognition software. Similar sounding words can inadvertently be transcribed and may not be corrected upon review.

## 2014-10-22 NOTE — Telephone Encounter (Signed)
per pof to sch pt appt-gave pt copy of sch-adv pt that [facility] would call -to sch scan-pt understood

## 2014-10-22 NOTE — [facility] Oncology Navigator Note (Unsigned)
Oncology Nurse Navigator Documentation  Oncology Nurse Navigator Flowsheets 10/22/2014  Navigator Encounter Type Other  Patient Visit Type Follow-up  Treatment Phase Other.  Seeing patient back after his surgery.  He looks great and has no complaints at this time.    Barriers/Navigation Needs No barriers at this time  Interventions None required  Time Spent with Patient 15

## 2014-11-11 ENCOUNTER — Encounter: Payer: Self-pay | Admitting: Internal Medicine

## 2014-11-11 NOTE — Progress Notes (Signed)
I placed combined insurance form on the desk of nurse for dr. Julien Nordmann

## 2014-11-13 ENCOUNTER — Encounter: Payer: Self-pay | Admitting: Internal Medicine

## 2014-11-13 NOTE — Progress Notes (Signed)
I faxed form to combined insurance  [phone]

## 2014-11-15 ENCOUNTER — Telehealth: Payer: Self-pay | Admitting: Internal Medicine

## 2014-11-15 NOTE — Telephone Encounter (Signed)
s.w. pt and and advised on 8.24 appt moved to 8.25 due to MD on pal..Marland KitchenMarland KitchenMarland Kitchenpt ok and aware

## 2014-11-20 ENCOUNTER — Encounter: Payer: Self-pay | Admitting: Radiation Oncology

## 2014-11-20 NOTE — Progress Notes (Signed)
GU Location of Tumor / Histology: prostatic adenocarcinoma   If Prostate Cancer, Gleason Score is (4 + 3) and PSA is (4.1). Prostate volume 26 ml.  Larry [name] was referred by his [name], MD (PCP) to Dr. Jeffie Pollock for an elevated PSA of 4.1 found July 2015. However, patient was seen in 2009 for ED.   Biopsies of prostate (if applicable) revealed:    Past/Anticipated interventions by urology, if any: prostate biopsy and referral for definitive treatment  Past/Anticipated interventions by medical oncology, if any: yes PRIOR THERAPY:  1) Neoadjuvant systemic chemotherapy with carboplatin for AUC of 5 and paclitaxel 175 MG/M2 every 3 weeks with Neulasta support. First dose 06/13/2014. Status post 3 cycle. 2) Status post Left video-assisted thoracoscopy, Thoracoscopic left upper lobectomy, Mediastinal lymph node dissection under the care of Dr. Roxan Hockey on 09/09/2014. CURRENT THERAPY: Observation.  Weight changes, if any: no  Bowel/Bladder complaints, if any: initially reported weak intermittent stream and ED; has an older brother treated for prostate ca  Nausea/Vomiting, if any: no  Pain issues, if any:  no  SAFETY ISSUES:  Prior radiation? no  Pacemaker/ICD? no  Possible current pregnancy? no  Is the patient on methotrexate? no  Current Complaints / other details:  64 year old male. Single. Has perineural invasion. Recently, completed chemotherapy with Dr. Earlie Server for lung ca. NKDA. Smokes 1 ppd for 16 years.

## 2014-11-21 ENCOUNTER — Encounter: Payer: Self-pay | Admitting: Radiation Oncology

## 2014-11-21 ENCOUNTER — Ambulatory Visit
Admission: RE | Admit: 2014-11-21 | Discharge: 2014-11-21 | Disposition: A | Discharge status: Home / Self Care | Payer: Medicaid Other | Source: Ambulatory Visit | Attending: Radiation Oncology | Admitting: Radiation Oncology

## 2014-11-21 VITALS — BP 117/76 | HR 78 | Resp 16 | Ht 72.0 in | Wt 188.6 lb

## 2014-11-21 DIAGNOSIS — Z79899 Other long term (current) drug therapy: Secondary | ICD-10-CM | POA: Diagnosis not present

## 2014-11-21 DIAGNOSIS — Z510 Encounter for antineoplastic radiation therapy: Secondary | ICD-10-CM | POA: Insufficient documentation

## 2014-11-21 DIAGNOSIS — C61 Malignant neoplasm of prostate: Secondary | ICD-10-CM | POA: Diagnosis not present

## 2014-11-21 DIAGNOSIS — F17210 Nicotine dependence, cigarettes, uncomplicated: Secondary | ICD-10-CM | POA: Diagnosis not present

## 2014-11-21 DIAGNOSIS — C3490 Malignant neoplasm of unspecified part of unspecified bronchus or lung: Secondary | ICD-10-CM

## 2014-11-21 NOTE — Addendum Note (Signed)
Encounter addended by: Tyler Pita, MD on: 11/21/2014  5:52 PM<BR>     Documentation filed: Notes Section

## 2014-11-21 NOTE — Progress Notes (Addendum)
Radiation Oncology         [phone] ________________________________  Initial outpatient Consultation  Name: OCTAVIA MOTTOLA MRN: [id number]  Date: 11/21/2014  DOB: September 05, 1950  OE:[id number], Luane School, NP  [name], NP   REFERRING PHYSICIAN: [name], NP  DIAGNOSIS: 64 y.o. gentleman with stage TIc adenocarcinoma of the prostate with a Gleason's score of 4+3 and a PSA of 4.1.    ICD-9-CM ICD-10-CM   1. Malignant neoplasm of prostate 185 C61     HISTORY OF PRESENT ILLNESS::Jumar D Kauth is a 64 y.o. gentleman.  He was noted to have an elevated PSA of 4.1 by his primary care physician, Dr. Laverta Baltimore.  Accordingly, he was referred for evaluation in urology by Dr. Jeffie Pollock on 01/21/14,  digital rectal examination was performed at that time revealing a 2+ prostate gland with no nodules.  The patient proceeded to transrectal ultrasound with 12 biopsies of the prostate on 03/14/14.  The prostate volume measured 26.73 cc.  Out of 12 core biopsies,4 were positive.  The maximum Gleason score was 4+3, and this was seen in the left mid gland with gleason's 3+4 seen in the left lateral mid, left base, and left lateral base. During the same time as his prostate workup, the pt was noted to have left hilar lymphadenopathy. Bronchoscopy in November revealed squamous cell carcinoma. PET CT showed localized disease and the pt went on to receive neoadjuvant chemotherapy with carbo taxol and then left upper lobectomy with Dr. Roxan Hockey on 09/09/14. Final pathology confirmed squamous cell carcinoma measuring 1.9 cm with negative margins and negative nodes. The pt has recovered from surgery and presents today to discuss the management of his prostate cancer. Repeat PSA on 11/13/14 was 5.01.  PREVIOUS RADIATION THERAPY: No  PAST MEDICAL HISTORY:  has a past medical history of Hyperlipidemia; COPD (chronic obstructive pulmonary disease); Non-small cell carcinoma of left lung; Shortness of breath  dyspnea; Cough; Lung cancer, upper lobe; and Prostate cancer.    PAST SURGICAL HISTORY: Past Surgical History  Procedure Laterality Date  . Colonoscopy    . Endobronchial ultrasound Bilateral 04/08/2014    Procedure: ENDOBRONCHIAL ULTRASOUND;  Surgeon: Kathee Delton, MD;  Location: [facility];  Service: Cardiopulmonary;  Laterality: Bilateral;  . Video bronchoscopy with endobronchial navigation Right 04/24/2014    Procedure: VIDEO BRONCHOSCOPY WITH ENDOBRONCHIAL NAVIGATION;  Surgeon: Collene Gobble, MD;  Location: [place];  Service: Thoracic;  Laterality: Right;  . Video bronchoscopy with endobronchial ultrasound Right 04/24/2014    Procedure: VIDEO BRONCHOSCOPY WITH ENDOBRONCHIAL ULTRASOUND;  Surgeon: Collene Gobble, MD;  Location: [place];  Service: Thoracic;  Laterality: Right;  . Video assisted thoracoscopy (vats)/ lobectomy Left 09/09/2014    Procedure: LEFT VIDEO ASSISTED THORACOSCOPY (VATS) with LEFT UPPER LOBECTOMY;  Surgeon: Melrose Nakayama, MD;  Location: [place];  Service: Thoracic;  Laterality: Left;    FAMILY HISTORY: family history includes Cancer in his brother; Diabetes in his mother; Heart attack in his father and mother; Heart failure in his father.  SOCIAL HISTORY:  reports that he has been smoking Cigarettes.  He has a 45 pack-year smoking history. He has never used smokeless tobacco. He reports that he does not drink alcohol or use illicit drugs.  ALLERGIES: Review of patient's allergies indicates no known allergies.  MEDICATIONS:  Current Outpatient Prescriptions  Medication Sig Dispense Refill  . atorvastatin (LIPITOR) 80 MG tablet Take 80 mg by mouth daily.    . budesonide-formoterol (SYMBICORT) 160-4.5 MCG/ACT inhaler Inhale 2  puffs into the lungs 2 (two) times daily. 1 Inhaler 11  . metoprolol succinate (TOPROL-XL) 25 MG 24 hr tablet Take 1 tablet (25 mg total) by mouth daily. 30 tablet 1  . oxyCODONE (OXY IR/ROXICODONE) 5 MG immediate release tablet   0   No  current facility-administered medications for this encounter.    REVIEW OF SYSTEMS:  A 15 point review of systems is documented in the electronic medical record. This was obtained by the nursing staff. However, I reviewed this with the patient to discuss relevant findings and make appropriate changes.  Pertinent items are noted in HPI..  The patient completed an IPSS and IIEF questionnaire.  His IPSS score was 10 indicating moderate urinary outflow obstructive symptoms.  He indicated that his erectile function is able to complete sexual activity less than half the time.  Weight and vitals stable. Denies bone pain. Denies unintentional weight loss. Reports occasional night sweats. Denies diarrhea. Describes his urine stream as an intermittent flow. Reports mild dysuria. Denies hematuria. Denies urgency. Denies leakage. Reports nocturia x 1. Hoarseness noted. Denies cough, shortness of breath or hemoptysis. Scheduled to follow up with Dr. Julien Nordmann on 8/25 reference lung ca.     PHYSICAL EXAM: This patient is in no acute distress.  He is alert and oriented.   height is 6' (1.829 m) and weight is 188 lb 9.6 oz (85.548 kg). His blood pressure is 117/76 and his pulse is 78. His respiration is 16 and oxygen saturation is 100%.  He exhibits no respiratory distress or labored breathing.  He appears neurologically intact.  His mood is pleasant.  His affect is appropriate.  Please note the digital rectal exam findings described above.  KPS = 100  100 - Normal; no complaints; no evidence of disease. 90   - Able to carry on normal activity; minor signs or symptoms of disease. 80   - Normal activity with effort; some signs or symptoms of disease. 43   - Cares for self; unable to carry on normal activity or to do active work. 60   - Requires occasional assistance, but is able to care for most of his personal needs. 50   - Requires considerable assistance and frequent medical care. 10   - Disabled; requires special  care and assistance. 51   - Severely disabled; hospital admission is indicated although death not imminent. 24   - Very sick; hospital admission necessary; active supportive treatment necessary. 10   - Moribund; fatal processes progressing rapidly. 0     - Dead  Karnofsky DA, Abelmann [place], Craver LS and [facility] [phone]) The use of the nitrogen mustards in the palliative treatment of carcinoma: with particular reference to bronchogenic carcinoma Cancer 1 [id number]   LABORATORY DATA:  Lab Results  Component Value Date   WBC 6.8 10/22/2014   HGB 12.8* 10/22/2014   HCT 37.1* 10/22/2014   MCV 80.0 10/22/2014   PLT 224 10/22/2014   Lab Results  Component Value Date   NA 138 10/22/2014   K 4.0 10/22/2014   CL 98 09/12/2014   CO2 23 10/22/2014   Lab Results  Component Value Date   ALT 9 10/22/2014   AST 17 10/22/2014   ALKPHOS 88 10/22/2014   BILITOT 0.35 10/22/2014     RADIOGRAPHY: No results found.    IMPRESSION: This is a 64 y.o. gentlemanwith stage TIc adenocarcinoma of the prostate with a Gleason's score of 4+3 and a PSA of 4.1..  His T-Stage, Gleason's  Score, and PSA put him into the intermediate risk group.  Accordingly he is eligible for a variety of potential treatment options including hormonal therapy, brachytherapy, and external radiation therapy.  PLAN: Today I reviewed the findings and workup thus far.  We discussed the natural history of prostate cancer.  We reviewed the the implications of T-stage, Gleason's Score, and PSA on decision-making and outcomes in prostate cancer.  We discussed radiation treatment in the management of prostate cancer with regard to the logistics and delivery of external beam radiation treatment as well as the logistics and delivery of prostate brachytherapy.  We compared and contrasted each of these approaches and also compared these against prostatectomy.  The patient expressed interest in external beam radiotherapy.  I filled out a patient  counseling form for him with relevant treatment diagrams and we retained a copy for our records.   We discussed the pros and cons of hormone therapy  The patient would like to proceed with androgen deprivation, external radiation and prostate seed implant.  I will share my findings with Dr. Jeffie Pollock and move forward with scheduling initiation of androgen deprivation followed by placement of three gold fiducial markers into the prostate to proceed with external radiotherapy to begin 2 months after hormone therapy.     I enjoyed meeting with him today, and will look forward to participating in the care of this very nice gentleman.  I spent 60 minutes face to face with the patient and more than 50% of that time was spent in counseling and/or coordination of care.   This document serves as a record of services personally performed by Tyler Pita, MD. It was created on his behalf by Darcus Austin, a trained medical scribe. The creation of this record is based on the scribe's personal observations and the provider's statements to them. This document has been checked and approved by the attending provider.     ------------------------------------------------  Sheral Apley Tammi Klippel, M.D.

## 2014-11-21 NOTE — Progress Notes (Signed)
See progress note under physician encounter. 

## 2014-11-21 NOTE — Progress Notes (Addendum)
Weight and vitals stable. Denies bone pain. Denies unintentional weight loss. Reports occasional night sweats. Denies diarrhea. Describes his urine stream as an intermittent flow. Reports mild dysuria. Denies hematuria. Denies urgency. Denies leakage. Reports nocturia x 1. Hoarseness noted. Denies cough, shortness of breath or hemoptysis. Scheduled to follow up with Dr. Julien Nordmann on 8/25 reference lung ca.   BP 117/76 mmHg  Pulse 78  Resp 16  Ht 6' (1.829 m)  Wt 188 lb 9.6 oz (85.548 kg)  BMI 25.57 kg/m2  SpO2 100% Wt Readings from Last 3 Encounters:  11/21/14 188 lb 9.6 oz (85.548 kg)  10/22/14 192 lb 1.6 oz (87.136 kg)  10/08/14 192 lb (87.091 kg)

## 2014-11-21 NOTE — Addendum Note (Signed)
Encounter addended by: Tyler Pita, MD on: 11/21/2014  5:58 PM<BR>     Documentation filed: Arn Medal VN

## 2014-11-28 ENCOUNTER — Telehealth: Payer: Self-pay | Admitting: *Deleted

## 2014-11-28 NOTE — Telephone Encounter (Signed)
Called patient to inform of gold seed placement on 12-20-14 @ 11:15 am @ Dr. Ralene Muskrat Office and his sim on 01-17-15 @ 3 pm , patient to start RT - 2 months from hormone injection - (hormone injection date 11-25-14), spoke with patient and he is aware of these appts.

## 2014-12-12 ENCOUNTER — Encounter: Payer: Self-pay | Admitting: Internal Medicine

## 2014-12-18 ENCOUNTER — Other Ambulatory Visit: Payer: Self-pay | Admitting: Thoracic Surgery (Cardiothoracic Vascular Surgery)

## 2014-12-18 DIAGNOSIS — C3490 Malignant neoplasm of unspecified part of unspecified bronchus or lung: Secondary | ICD-10-CM

## 2014-12-24 ENCOUNTER — Ambulatory Visit
Admission: RE | Admit: 2014-12-24 | Discharge: 2014-12-24 | Disposition: A | Discharge status: Home / Self Care | Payer: Medicaid Other | Source: Ambulatory Visit | Attending: Thoracic Surgery (Cardiothoracic Vascular Surgery) | Admitting: Thoracic Surgery (Cardiothoracic Vascular Surgery)

## 2014-12-24 ENCOUNTER — Encounter: Payer: Self-pay | Admitting: Thoracic Surgery (Cardiothoracic Vascular Surgery)

## 2014-12-24 ENCOUNTER — Ambulatory Visit (INDEPENDENT_AMBULATORY_CARE_PROVIDER_SITE_OTHER): Payer: Medicaid Other | Admitting: Thoracic Surgery (Cardiothoracic Vascular Surgery)

## 2014-12-24 VITALS — BP 131/83 | HR 72 | Resp 16 | Ht 72.0 in | Wt 188.0 lb

## 2014-12-24 DIAGNOSIS — Z902 Acquired absence of lung [part of]: Secondary | ICD-10-CM

## 2014-12-24 DIAGNOSIS — C3412 Malignant neoplasm of upper lobe, left bronchus or lung: Secondary | ICD-10-CM

## 2014-12-24 DIAGNOSIS — C3490 Malignant neoplasm of unspecified part of unspecified bronchus or lung: Secondary | ICD-10-CM

## 2014-12-24 DIAGNOSIS — Z9889 Other specified postprocedural states: Secondary | ICD-10-CM | POA: Diagnosis not present

## 2014-12-24 NOTE — Progress Notes (Signed)
[address]       [place] [address]             [phone]       HPI:  Mr. Lowrimore returns today for a scheduled follow-up visit.  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 He had neo-adjuvant chemotherapy with carboplatin and Taxol, followed by a thoracoscopic left upper lobectomy on 09/09/2014. His final stage was ypT1a, N1- stage IIA. He did not require postoperative adjuvant chemotherapy.  He was last in the office on May 10. He was recovering well from surgery at that time.  He says that he has minimal pain associated with his incision. He sometimes feels a pins and needles sensation. He has a chronic cough. He denies any hemoptysis. He says that he's been hoarse since his surgery. He says that comes and goes and has gotten a little better over time. He is still smoking 2 cigarettes a day.  Past Medical History  Diagnosis Date  . Hyperlipidemia   . COPD (chronic obstructive pulmonary disease)   . Non-small cell carcinoma of left lung   . Shortness of breath dyspnea   . Cough   . Lung cancer, upper lobe     Stage II a non-small cell carcinoma left upper lobe  . Prostate cancer    Past Surgical History  Procedure Laterality Date  . Colonoscopy    . Endobronchial ultrasound Bilateral 04/08/2014    Procedure: ENDOBRONCHIAL ULTRASOUND;  Surgeon: Kathee Delton, MD;  Location: [facility];  Service: Cardiopulmonary;  Laterality: Bilateral;  . Video bronchoscopy with endobronchial navigation Right 04/24/2014    Procedure: VIDEO BRONCHOSCOPY WITH ENDOBRONCHIAL NAVIGATION;  Surgeon: Collene Gobble, MD;  Location: [place];  Service: Thoracic;  Laterality: Right;  . Video bronchoscopy with endobronchial ultrasound Right 04/24/2014    Procedure: VIDEO BRONCHOSCOPY WITH ENDOBRONCHIAL ULTRASOUND;  Surgeon: Collene Gobble, MD;  Location: [place];  Service: Thoracic;  Laterality: Right;  . Video assisted thoracoscopy (vats)/  lobectomy Left 09/09/2014    Procedure: LEFT VIDEO ASSISTED THORACOSCOPY (VATS) with LEFT UPPER LOBECTOMY;  Surgeon: Melrose Nakayama, MD;  Location: [place];  Service: Thoracic;  Laterality: Left;      Current Outpatient Prescriptions  Medication Sig Dispense Refill  . atorvastatin (LIPITOR) 80 MG tablet Take 80 mg by mouth daily.    . budesonide-formoterol (SYMBICORT) 160-4.5 MCG/ACT inhaler Inhale 2 puffs into the lungs 2 (two) times daily. 1 Inhaler 11   No current facility-administered medications for this visit.    Physical Exam BP 131/83 mmHg  Pulse 72  Resp 16  Ht 6' (1.829 m)  Wt 188 lb (85.276 kg)  BMI 25.49 kg/m2  SpO22 17% 64 year old man in no acute distress Well-developed and well-nourished No cervical or subclavicular adenopathy Cardiac regular rate and rhythm normal S1 and S2 Incisions well healed Lungs with mildly diminished breath sounds at left base No peripheral edema  Diagnostic Tests: I personally reviewed the chest x-ray. There is no evidence of recurrent disease.  Impression: 64 year old gentleman who is about 3 months out from a thoracoscopic left upper lobectomy for stage IIa squamous cell carcinoma following neo adjuvant chemotherapy. Overall he is doing quite well. He is not having any problems with shortness of breath. He does have a chronic cough. Unfortunately does continue to smoke cigarettes. I once again counseled him about the importance of complete tobacco cessation.   He has  been hoarse since the surgery. I suspect this is due to recurrent nerve dysfunction due to the AP window node dissection. He says it comes and goes and has been slowly getting better over time so I suspect that will recover. We will readdress that issue when he returns for his 6 month follow-up visit and if he still having issues at that time we will refer him for ENT evaluation.  He has an appointment with Dr. Julien Nordmann in August and will have a CT of the chest at that  time  Plan: Return in 3 months with PA and lateral chest x-ray.  Melrose Nakayama, MD [facility] [phone]

## 2015-01-15 ENCOUNTER — Other Ambulatory Visit (HOSPITAL_BASED_OUTPATIENT_CLINIC_OR_DEPARTMENT_OTHER): Payer: Medicare Other

## 2015-01-15 DIAGNOSIS — C3492 Malignant neoplasm of unspecified part of left bronchus or lung: Secondary | ICD-10-CM

## 2015-01-15 DIAGNOSIS — C3402 Malignant neoplasm of left main bronchus: Secondary | ICD-10-CM | POA: Diagnosis present

## 2015-01-15 LAB — COMPREHENSIVE METABOLIC PANEL (CC13)
ALK PHOS: 83 U/L (ref 40–150)
ALT: 14 U/L (ref 0–55)
ANION GAP: 10 meq/L (ref 3–11)
AST: 16 U/L (ref 5–34)
Albumin: 3.8 g/dL (ref 3.5–5.0)
BUN: 11.5 mg/dL (ref 7.0–26.0)
CO2: 25 meq/L (ref 22–29)
Calcium: 9.3 mg/dL (ref 8.4–10.4)
Chloride: 106 mEq/L (ref 98–109)
Creatinine: 0.8 mg/dL (ref 0.7–1.3)
Glucose: 99 mg/dl (ref 70–140)
Potassium: 3.9 mEq/L (ref 3.5–5.1)
SODIUM: 141 meq/L (ref 136–145)
Total Bilirubin: 0.3 mg/dL (ref 0.20–1.20)
Total Protein: 7.3 g/dL (ref 6.4–8.3)

## 2015-01-15 LAB — CBC WITH DIFFERENTIAL/PLATELET
BASO%: 0.5 % (ref 0.0–2.0)
BASOS ABS: 0 10*3/uL (ref 0.0–0.1)
EOS%: 2.9 % (ref 0.0–7.0)
Eosinophils Absolute: 0.2 10*3/uL (ref 0.0–0.5)
HCT: 38.3 % — ABNORMAL LOW (ref 38.4–49.9)
HGB: 13.2 g/dL (ref 13.0–17.1)
LYMPH%: 32.7 % (ref 14.0–49.0)
MCH: 26.8 pg — AB (ref 27.2–33.4)
MCHC: 34.5 g/dL (ref 32.0–36.0)
MCV: 77.7 fL — AB (ref 79.3–98.0)
MONO#: 0.7 10*3/uL (ref 0.1–0.9)
MONO%: 8.7 % (ref 0.0–14.0)
NEUT#: 4.6 10*3/uL (ref 1.5–6.5)
NEUT%: 55.2 % (ref 39.0–75.0)
Platelets: 182 10*3/uL (ref 140–400)
RBC: 4.93 10*6/uL (ref 4.20–5.82)
RDW: 14.8 % — ABNORMAL HIGH (ref 11.0–14.6)
WBC: 8.3 10*3/uL (ref 4.0–10.3)
lymph#: 2.7 10*3/uL (ref 0.9–3.3)

## 2015-01-17 ENCOUNTER — Ambulatory Visit
Admission: RE | Admit: 2015-01-17 | Discharge: 2015-01-17 | Disposition: A | Discharge status: Home / Self Care | Payer: Medicaid Other | Source: Ambulatory Visit | Attending: Radiation Oncology | Admitting: Radiation Oncology

## 2015-01-17 DIAGNOSIS — C61 Malignant neoplasm of prostate: Secondary | ICD-10-CM | POA: Diagnosis not present

## 2015-01-17 NOTE — Progress Notes (Addendum)
  Radiation Oncology         [phone] ________________________________  Name: Larry [name] MRN: [id number]  Date: 01/17/2015  DOB: [birth year]  SIMULATION AND TREATMENT PLANNING NOTE    ICD-9-CM ICD-10-CM   1. Malignant neoplasm of prostate 185 C61     DIAGNOSIS:  64 y.o. gentleman with stage TIc adenocarcinoma of the prostate with a Gleason's score of 4+3 and a PSA of 4.1.  NARRATIVE:  The patient was brought to the [facility].  Identity was confirmed.  All relevant records and images related to the planned course of therapy were reviewed.  The patient freely provided informed written consent to proceed with treatment after reviewing the details related to the planned course of therapy. The consent form was witnessed and verified by the simulation staff.  Then, the patient was set-up in a stable reproducible supine position for radiation therapy.  A vacuum lock pillow device was custom fabricated to position his legs in a reproducible immobilized position.  Then, I performed a urethrogram under sterile conditions to identify the prostatic apex.  CT images were obtained.  Surface markings were placed.  The CT images were loaded into the planning software.  Then the prostate target and avoidance structures including the rectum, bladder, bowel and hips were contoured.  Treatment planning then occurred.  The radiation prescription was entered and confirmed.  A total of 5 complex treatment devices were fabricated including one custom fit BodyFix pillow and 4 MLC's to shield bowel, bladder, rectum and hips. I have requested : 3D Simulation  I have requested a DVH of the following structures: rectum, bladder, prostate, left hip, and right hip.  PUBIC ARCH EVALUATION:  The patient also underwent evaluation for possible prostate seed implant. His 3-dimensional image study set was uploaded to the planning computer. There, on each axial slice, I contoured the prostate gland. Then, using  three-dimensional radiation planning tools I reconstructed the prostate in view of the structures from the transperineal needle pathway to assess for possible pubic arch interference. In doing so, I did not appreciate any pubic arch interference. Also, the patient's prostate volume was estimated based on the drawn structure. The volume was 26 cc.  Given the pubic arch appearance and prostate volume, patient remains a good candidate to proceed with prostate seed implant.     PLAN:  The patient will receive 45 Gy in 25 fractions followed by prostate seed implant  ________________________________  Sheral Apley. Tammi Klippel, M.D.

## 2015-01-19 NOTE — Addendum Note (Signed)
Encounter addended by: Tyler Pita, MD on: 01/19/2015  4:36 PM<BR>     Documentation filed: Problem List, Clinical Notes, Notes Section

## 2015-01-20 DIAGNOSIS — C61 Malignant neoplasm of prostate: Secondary | ICD-10-CM | POA: Diagnosis not present

## 2015-01-21 ENCOUNTER — Ambulatory Visit (HOSPITAL_COMMUNITY)
Admission: RE | Admit: 2015-01-21 | Discharge: 2015-01-21 | Disposition: A | Discharge status: Home / Self Care | Payer: Medicare Other | Source: Ambulatory Visit | Attending: Internal Medicine | Admitting: Internal Medicine

## 2015-01-21 ENCOUNTER — Encounter (HOSPITAL_COMMUNITY): Payer: Self-pay

## 2015-01-21 DIAGNOSIS — C3492 Malignant neoplasm of unspecified part of left bronchus or lung: Secondary | ICD-10-CM | POA: Diagnosis present

## 2015-01-21 DIAGNOSIS — Z9889 Other specified postprocedural states: Secondary | ICD-10-CM | POA: Diagnosis not present

## 2015-01-21 DIAGNOSIS — Z08 Encounter for follow-up examination after completed treatment for malignant neoplasm: Secondary | ICD-10-CM | POA: Insufficient documentation

## 2015-01-21 MED ORDER — IOHEXOL 300 MG/ML  SOLN
75.0000 mL | Freq: Once | INTRAMUSCULAR | Status: AC | PRN
  Administered 2015-01-21: 75 mL via INTRAVENOUS

## 2015-01-22 ENCOUNTER — Ambulatory Visit: Payer: Medicaid Other | Admitting: Internal Medicine

## 2015-01-23 ENCOUNTER — Telehealth: Payer: Self-pay | Admitting: Internal Medicine

## 2015-01-23 ENCOUNTER — Ambulatory Visit (HOSPITAL_BASED_OUTPATIENT_CLINIC_OR_DEPARTMENT_OTHER): Payer: Medicare Other | Admitting: Internal Medicine

## 2015-01-23 ENCOUNTER — Encounter: Payer: Self-pay | Admitting: Internal Medicine

## 2015-01-23 VITALS — BP 137/75 | HR 67 | Temp 98.4°F | Resp 17 | Ht 72.0 in | Wt 194.4 lb

## 2015-01-23 DIAGNOSIS — C3492 Malignant neoplasm of unspecified part of left bronchus or lung: Secondary | ICD-10-CM

## 2015-01-23 DIAGNOSIS — C3402 Malignant neoplasm of left main bronchus: Secondary | ICD-10-CM

## 2015-01-23 NOTE — Progress Notes (Signed)
[place] [phone]   [phone]  OFFICE PROGRESS NOTE  Larry Pillow, NP [facility] [place] [address]  DIAGNOSIS: Stage IIA (T1a, N1, M0) non-small cell lung cancer, squamous cell carcinoma diagnosed in November 2015, presented with left suprahilar soft tissue mass.  PRIOR THERAPY:  1) Neoadjuvant systemic chemotherapy with carboplatin for AUC of 5 and paclitaxel 175 MG/M2 every 3 weeks with Neulasta support. First dose 06/13/2014. Status post 3 cycle. 2) Status post Left video-assisted thoracoscopy, Thoracoscopic left upper lobectomy, Mediastinal lymph node dissection under the care of Dr. Roxan Hockey on 09/09/2014.   CURRENT THERAPY: Observation.  INTERVAL HISTORY: Larry [name] 64 y.o. male returns to the clinic today for follow. The patient is feeling fine today with no specific complaints. He denied having any significant nausea or vomiting. The patient denied having any significant chest pain, shortness of breath with exertion with no cough or hemoptysis. He denied having any significant weight loss or night sweats. He had repeat CT scan of the chest performed recently and he is here for evaluation and discussion of his scan results.  MEDICAL HISTORY: Past Medical History  Diagnosis Date  . Hyperlipidemia   . COPD (chronic obstructive pulmonary disease)   . Shortness of breath dyspnea   . Cough   . Non-small cell carcinoma of left lung   . Lung cancer, upper lobe     Stage II a non-small cell carcinoma left upper lobe  . Prostate cancer     ALLERGIES:  has No Known Allergies.  MEDICATIONS:  Current Outpatient Prescriptions  Medication Sig Dispense Refill  . atorvastatin (LIPITOR) 80 MG tablet Take 80 mg by mouth daily.    . budesonide-formoterol (SYMBICORT) 160-4.5 MCG/ACT inhaler Inhale 2 puffs into the lungs 2 (two) times daily. 1 Inhaler 11   No current  facility-administered medications for this visit.    SURGICAL HISTORY:  Past Surgical History  Procedure Laterality Date  . Colonoscopy    . Endobronchial ultrasound Bilateral 04/08/2014    Procedure: ENDOBRONCHIAL ULTRASOUND;  Surgeon: Kathee Delton, MD;  Location: [facility];  Service: Cardiopulmonary;  Laterality: Bilateral;  . Video bronchoscopy with endobronchial navigation Right 04/24/2014    Procedure: VIDEO BRONCHOSCOPY WITH ENDOBRONCHIAL NAVIGATION;  Surgeon: Collene Gobble, MD;  Location: [facility];  Service: Thoracic;  Laterality: Right;  . Video bronchoscopy with endobronchial ultrasound Right 04/24/2014    Procedure: VIDEO BRONCHOSCOPY WITH ENDOBRONCHIAL ULTRASOUND;  Surgeon: Collene Gobble, MD;  Location: [facility];  Service: Thoracic;  Laterality: Right;  . Video assisted thoracoscopy (vats)/ lobectomy Left 09/09/2014    Procedure: LEFT VIDEO ASSISTED THORACOSCOPY (VATS) with LEFT UPPER LOBECTOMY;  Surgeon: Melrose Nakayama, MD;  Location: [facility];  Service: Thoracic;  Laterality: Left;    REVIEW OF SYSTEMS:  A comprehensive review of systems was negative.   PHYSICAL EXAMINATION: General appearance: alert, cooperative, fatigued and no distress Head: Normocephalic, without obvious abnormality, atraumatic Neck: no adenopathy, no JVD, supple, symmetrical, trachea midline and thyroid not enlarged, symmetric, no tenderness/mass/nodules Lymph nodes: Cervical, supraclavicular, and axillary nodes normal. Resp: clear to auscultation bilaterally Back: symmetric, no curvature. ROM normal. No CVA tenderness. Cardio: regular rate and rhythm, S1, S2 normal, no murmur, click, rub or gallop GI: soft, non-tender; bowel sounds normal; no masses,  no organomegaly Extremities: extremities normal, atraumatic, no cyanosis or edema Neurologic: Alert and oriented X 3, normal strength and tone. Normal symmetric reflexes. Normal  coordination and gait  ECOG PERFORMANCE STATUS: 1 - Symptomatic but  completely ambulatory  Blood pressure 137/75, pulse 67, temperature 98.4 F (36.9 C), temperature source Oral, resp. rate 17, height 6' (1.829 m), weight 194 lb 6.4 oz (88.179 kg), SpO2 99 %.  LABORATORY DATA: Lab Results  Component Value Date   WBC 8.3 01/15/2015   HGB 13.2 01/15/2015   HCT 38.3* 01/15/2015   MCV 77.7* 01/15/2015   PLT 182 01/15/2015      Chemistry      Component Value Date/Time   NA 141 01/15/2015 0924   NA 135 09/12/2014 0320   K 3.9 01/15/2015 0924   K 4.1 09/12/2014 0320   CL 98 09/12/2014 0320   CO2 25 01/15/2015 0924   CO2 28 09/12/2014 0320   BUN 11.5 01/15/2015 0924   BUN 6 09/12/2014 0320   CREATININE 0.8 01/15/2015 0924   CREATININE 0.76 09/12/2014 0320      Component Value Date/Time   CALCIUM 9.3 01/15/2015 0924   CALCIUM 8.7 09/12/2014 0320   ALKPHOS 83 01/15/2015 0924   ALKPHOS 62 09/11/2014 0425   AST 16 01/15/2015 0924   AST 26 09/11/2014 0425   ALT 14 01/15/2015 0924   ALT 17 09/11/2014 0425   BILITOT 0.30 01/15/2015 0924   BILITOT 0.7 09/11/2014 0425       RADIOGRAPHIC STUDIES: Ct Chest W Contrast  01/21/2015   CLINICAL DATA:  History of squamous cell lung cancer diagnosed November 2015, completed chemotherapy and radiation therapy. History of left upper lobectomy.  EXAM: CT CHEST WITH CONTRAST  TECHNIQUE: Multidetector CT imaging of the chest was performed during intravenous contrast administration.  CONTRAST:  85m OMNIPAQUE IOHEXOL 300 MG/ML  SOLN  COMPARISON:  CT 08/12/2014, PET-CT 02/05/2014  FINDINGS: Mediastinum/Nodes: Great vessels are normal in caliber. Mild atheromatous aortic and coronary arterial calcification. Heart size is normal. Trace fluid within the superior pericardial recess.  Representative pretracheal node is stable measuring 7 mm image 29. Interval left upper lobectomy without mass lesion at the resection margin. 5 mm epicardial node image 51 is new since the prior exam but nonspecific.  Lungs/Pleura: Diffuse  emphysematous changes are reidentified. Minimal image degradation due to motion predominantly at the lung apices. Thin curvilinear presumed scarring is noted within the superior segment right lower lobe image 33 but there is no measurable residual nodule in this area. No pleural effusion.  Upper abdomen: 1.1 cm left mid renal cortical cyst reidentified, image 71. Adrenal glands are unremarkable.  Musculoskeletal: No lytic or sclerotic osseous lesion.  IMPRESSION: Interval left upper lobectomy without CT evidence for recurrence or intrathoracic new metastatic disease.  No residual measurable component associated with curvilinear presumed scarring in the superior segment right lower lobe.   Electronically Signed   By: GConchita ParisM.D.   On: 01/21/2015 08:47    ASSESSMENT AND PLAN: This is a very pleasant 64years old African-American male with potentially resectable stage IIA non-small cell lung cancer, squamous cell carcinoma. He is a status post neoadjuvant systemic chemotherapy with carboplatin and paclitaxel for 3 cycles followed by left upper lobectomy with lymph node dissection. The patient is doing fine and there is no evidence for disease recurrence on the recent CT scan of the chest. I discussed the scan results with the patient today. I recommended for him to continue on observation with repeat CT scan of the chest in 4 months. He was advised to call immediately if he has any concerning symptoms in the  interval. The patient voices understanding of current disease status and treatment options and is in agreement with the current care plan.  All questions were answered. The patient knows to call the clinic with any problems, questions or concerns. We can certainly see the patient much sooner if necessary.  Disclaimer: This note was dictated with voice recognition software. Similar sounding words can inadvertently be transcribed and may not be corrected upon review.

## 2015-01-23 NOTE — Telephone Encounter (Signed)
per pof to schpt appt-gave pt copy of avs-adv [facility] will call to sch CT

## 2015-01-24 ENCOUNTER — Ambulatory Visit: Payer: Medicaid Other | Admitting: Radiation Oncology

## 2015-01-24 DIAGNOSIS — C61 Malignant neoplasm of prostate: Secondary | ICD-10-CM | POA: Diagnosis not present

## 2015-01-27 ENCOUNTER — Ambulatory Visit
Admission: RE | Admit: 2015-01-27 | Discharge: 2015-01-27 | Disposition: A | Discharge status: Home / Self Care | Payer: Medicaid Other | Source: Ambulatory Visit | Attending: Radiation Oncology | Admitting: Radiation Oncology

## 2015-01-27 DIAGNOSIS — C61 Malignant neoplasm of prostate: Secondary | ICD-10-CM | POA: Diagnosis not present

## 2015-01-28 ENCOUNTER — Ambulatory Visit
Admission: RE | Admit: 2015-01-28 | Discharge: 2015-01-28 | Disposition: A | Discharge status: Home / Self Care | Payer: Medicaid Other | Source: Ambulatory Visit | Attending: Radiation Oncology | Admitting: Radiation Oncology

## 2015-01-28 DIAGNOSIS — C61 Malignant neoplasm of prostate: Secondary | ICD-10-CM | POA: Diagnosis not present

## 2015-01-29 ENCOUNTER — Ambulatory Visit
Admission: RE | Admit: 2015-01-29 | Discharge: 2015-01-29 | Disposition: A | Discharge status: Home / Self Care | Payer: Medicaid Other | Source: Ambulatory Visit | Attending: Radiation Oncology | Admitting: Radiation Oncology

## 2015-01-29 DIAGNOSIS — C61 Malignant neoplasm of prostate: Secondary | ICD-10-CM | POA: Diagnosis not present

## 2015-01-30 ENCOUNTER — Ambulatory Visit
Admission: RE | Admit: 2015-01-30 | Discharge: 2015-01-30 | Disposition: A | Discharge status: Home / Self Care | Payer: Medicaid Other | Source: Ambulatory Visit | Attending: Radiation Oncology | Admitting: Radiation Oncology

## 2015-01-30 DIAGNOSIS — C61 Malignant neoplasm of prostate: Secondary | ICD-10-CM | POA: Diagnosis not present

## 2015-01-31 ENCOUNTER — Encounter: Payer: Self-pay | Admitting: Radiation Oncology

## 2015-01-31 ENCOUNTER — Ambulatory Visit
Admission: RE | Admit: 2015-01-31 | Discharge: 2015-01-31 | Disposition: A | Discharge status: Home / Self Care | Payer: Medicaid Other | Source: Ambulatory Visit | Attending: Radiation Oncology | Admitting: Radiation Oncology

## 2015-01-31 ENCOUNTER — Ambulatory Visit
Admission: RE | Admit: 2015-01-31 | Discharge: 2015-01-31 | Disposition: A | Discharge status: Home / Self Care | Payer: Medicare Other | Source: Ambulatory Visit | Attending: Radiation Oncology | Admitting: Radiation Oncology

## 2015-01-31 VITALS — BP 122/82 | HR 79 | Resp 16 | Wt 189.5 lb

## 2015-01-31 DIAGNOSIS — C61 Malignant neoplasm of prostate: Secondary | ICD-10-CM

## 2015-01-31 NOTE — Progress Notes (Addendum)
Weight and vitals stable. Denies pain. Reports nocturia x 2. Denies urgency or incontinence. Denies frequency or dysuria. Denies diarrhea or fatigue. Oriented patient to staff and routine of the clinic. Provided patient with RADIATION THERAPY AND YOU handbook then, reviewed pertinent information. Educated patient reference potential side effects and management such as fatigue, diarrhea and urinary/bladder changes. Answered all patient questions to the best of my ability. Provided patient with my business card and encouraged him to call with needs. Patient verbalized understanding of all reviewed.   BP 122/82 mmHg  Pulse 79  Resp 16  Wt 189 lb 8 oz (85.957 kg) Wt Readings from Last 3 Encounters:  01/31/15 189 lb 8 oz (85.957 kg)  01/23/15 194 lb 6.4 oz (88.179 kg)  12/24/14 188 lb (85.276 kg)

## 2015-01-31 NOTE — Progress Notes (Signed)
  Radiation Oncology         [phone]     Name: Larry [name] MRN: [id number]   Date: 01/31/2015  DOB: 1951/04/09   Weekly Radiation Therapy Management    ICD-9-CM ICD-10-CM   1. Malignant neoplasm of prostate 185 C61     Current Dose: 7.2 Gy  Planned Dose:  45 Gy  Narrative The patient presents for routine under treatment assessment. Weight and vitals stable. Denies pain. Reports nocturia x 2. Denies urgency or incontinence. Denies frequency or dysuria. Denies diarrhea or fatigue.  The patient is without complaint. Set-up films were reviewed. The chart was checked.  Physical Findings  weight is 189 lb 8 oz (85.957 kg). His blood pressure is 122/82 and his pulse is 79. His respiration is 16. . Weight essentially stable.  No significant changes.  Impression The patient is tolerating radiation.  Plan Continue treatment as planned.     This document serves as a record of services personally performed by Tyler Pita, MD. It was created on his behalf by Arlyce Harman, a trained medical scribe. The creation of this record is based on the scribe's personal observations and the provider's statements to them. This document has been checked and approved by the attending provider.       Sheral Apley Tammi Klippel, M.D.

## 2015-02-04 ENCOUNTER — Ambulatory Visit
Admission: RE | Admit: 2015-02-04 | Discharge: 2015-02-04 | Disposition: A | Discharge status: Home / Self Care | Payer: Medicaid Other | Source: Ambulatory Visit | Attending: Radiation Oncology | Admitting: Radiation Oncology

## 2015-02-04 DIAGNOSIS — C61 Malignant neoplasm of prostate: Secondary | ICD-10-CM | POA: Diagnosis not present

## 2015-02-05 ENCOUNTER — Ambulatory Visit
Admission: RE | Admit: 2015-02-05 | Discharge: 2015-02-05 | Disposition: A | Discharge status: Home / Self Care | Payer: Medicaid Other | Source: Ambulatory Visit | Attending: Radiation Oncology | Admitting: Radiation Oncology

## 2015-02-05 DIAGNOSIS — C61 Malignant neoplasm of prostate: Secondary | ICD-10-CM | POA: Diagnosis not present

## 2015-02-06 ENCOUNTER — Ambulatory Visit
Admission: RE | Admit: 2015-02-06 | Discharge: 2015-02-06 | Disposition: A | Discharge status: Home / Self Care | Payer: Medicare Other | Source: Ambulatory Visit | Attending: Radiation Oncology | Admitting: Radiation Oncology

## 2015-02-06 ENCOUNTER — Encounter: Payer: Self-pay | Admitting: Radiation Oncology

## 2015-02-06 ENCOUNTER — Ambulatory Visit
Admission: RE | Admit: 2015-02-06 | Discharge: 2015-02-06 | Disposition: A | Discharge status: Home / Self Care | Payer: Medicaid Other | Source: Ambulatory Visit | Attending: Radiation Oncology | Admitting: Radiation Oncology

## 2015-02-06 VITALS — BP 136/75 | HR 68 | Resp 16 | Wt 192.0 lb

## 2015-02-06 DIAGNOSIS — C3490 Malignant neoplasm of unspecified part of unspecified bronchus or lung: Secondary | ICD-10-CM

## 2015-02-06 DIAGNOSIS — C61 Malignant neoplasm of prostate: Secondary | ICD-10-CM | POA: Diagnosis not present

## 2015-02-06 NOTE — Progress Notes (Signed)
  Radiation Oncology         [phone]     Name: Larry [name] MRN: [id number]   Date: 02/06/2015  DOB: 1951/01/04   Weekly Radiation Therapy Management    ICD-9-CM ICD-10-CM   1. Squamous cell carcinoma of lung, stage II, unspecified laterality 162.9 C34.90     Current Dose: 12.6 Gy  Planned Dose:  45 Gy  Narrative The patient presents for routine under treatment assessment. Weight and vitals stable. Denies pain. Reports nocturia x 2. Denies urgency or incontinence. Denies frequency or dysuria. Denies diarrhea or fatigue. Reports recent leg cramps. The patient is without complaint. Set-up films were reviewed. The chart was checked.  Physical Findings  weight is 192 lb (87.091 kg). His blood pressure is 136/75 and his pulse is 68. His respiration is 16. . Weight essentially stable.  No significant changes.  Impression The patient is tolerating radiation.  Plan Continue treatment as planned. I have suggested increasing potassium intake to decrease his leg cramps.    This document serves as a record of services personally performed by Tyler Pita, MD. It was created on his behalf by Arlyce Harman, a trained medical scribe. The creation of this record is based on the scribe's personal observations and the provider's statements to them. This document has been checked and approved by the attending provider.       Sheral Apley Tammi Klippel, M.D.

## 2015-02-06 NOTE — Progress Notes (Signed)
Weight and vitals stable. Denies pain. Reports nocturia x 2. Denies urgency or incontinence. Denies frequency or dysuria. Denies diarrhea or fatigue.  BP 136/75 mmHg  Pulse 68  Resp 16  Wt 192 lb (87.091 kg) Wt Readings from Last 3 Encounters:  02/06/15 192 lb (87.091 kg)  01/31/15 189 lb 8 oz (85.957 kg)  01/23/15 194 lb 6.4 oz (88.179 kg)

## 2015-02-07 ENCOUNTER — Ambulatory Visit
Admission: RE | Admit: 2015-02-07 | Discharge: 2015-02-07 | Disposition: A | Discharge status: Home / Self Care | Payer: Medicaid Other | Source: Ambulatory Visit | Attending: Radiation Oncology | Admitting: Radiation Oncology

## 2015-02-07 DIAGNOSIS — C61 Malignant neoplasm of prostate: Secondary | ICD-10-CM | POA: Diagnosis not present

## 2015-02-10 ENCOUNTER — Ambulatory Visit
Admission: RE | Admit: 2015-02-10 | Discharge: 2015-02-10 | Disposition: A | Discharge status: Home / Self Care | Payer: Medicaid Other | Source: Ambulatory Visit | Attending: Radiation Oncology | Admitting: Radiation Oncology

## 2015-02-10 DIAGNOSIS — C61 Malignant neoplasm of prostate: Secondary | ICD-10-CM | POA: Diagnosis not present

## 2015-02-11 ENCOUNTER — Ambulatory Visit
Admission: RE | Admit: 2015-02-11 | Discharge: 2015-02-11 | Disposition: A | Discharge status: Home / Self Care | Payer: Medicaid Other | Source: Ambulatory Visit | Attending: Radiation Oncology | Admitting: Radiation Oncology

## 2015-02-11 DIAGNOSIS — C61 Malignant neoplasm of prostate: Secondary | ICD-10-CM | POA: Diagnosis not present

## 2015-02-12 ENCOUNTER — Ambulatory Visit
Admission: RE | Admit: 2015-02-12 | Discharge: 2015-02-12 | Disposition: A | Discharge status: Home / Self Care | Payer: Medicaid Other | Source: Ambulatory Visit | Attending: Radiation Oncology | Admitting: Radiation Oncology

## 2015-02-12 DIAGNOSIS — C61 Malignant neoplasm of prostate: Secondary | ICD-10-CM | POA: Diagnosis not present

## 2015-02-13 ENCOUNTER — Ambulatory Visit
Admission: RE | Admit: 2015-02-13 | Discharge: 2015-02-13 | Disposition: A | Discharge status: Home / Self Care | Payer: Medicaid Other | Source: Ambulatory Visit | Attending: Radiation Oncology | Admitting: Radiation Oncology

## 2015-02-13 DIAGNOSIS — C61 Malignant neoplasm of prostate: Secondary | ICD-10-CM | POA: Diagnosis not present

## 2015-02-14 ENCOUNTER — Ambulatory Visit
Admission: RE | Admit: 2015-02-14 | Discharge: 2015-02-14 | Disposition: A | Discharge status: Home / Self Care | Payer: Medicaid Other | Source: Ambulatory Visit | Attending: Radiation Oncology | Admitting: Radiation Oncology

## 2015-02-14 ENCOUNTER — Encounter: Payer: Self-pay | Admitting: Radiation Oncology

## 2015-02-14 ENCOUNTER — Ambulatory Visit
Admission: RE | Admit: 2015-02-14 | Discharge: 2015-02-14 | Disposition: A | Discharge status: Home / Self Care | Payer: Medicare Other | Source: Ambulatory Visit | Attending: Radiation Oncology | Admitting: Radiation Oncology

## 2015-02-14 VITALS — BP 110/71 | HR 72 | Resp 16 | Wt 194.1 lb

## 2015-02-14 DIAGNOSIS — C61 Malignant neoplasm of prostate: Secondary | ICD-10-CM | POA: Diagnosis not present

## 2015-02-14 DIAGNOSIS — C3490 Malignant neoplasm of unspecified part of unspecified bronchus or lung: Secondary | ICD-10-CM

## 2015-02-14 NOTE — Progress Notes (Signed)
  Radiation Oncology         [phone]     Name: Larry [name] MRN: [id number]   Date: 02/14/2015  DOB: [birth year]   Weekly Radiation Therapy Management    ICD-9-CM ICD-10-CM   1. Malignant neoplasm of prostate 185 C61     Current Dose: 22.4 Gy  Planned Dose:  45 Gy + seed implant  Narrative The patient presents for routine under treatment assessment. He has completed 13/25 fractions thus far. The patient's weight and vitals are currently stable. He denies symptoms of pain, urgency or incontinence, frequency or dysuria, and diarrhea or fatigue. He reports nocturia x2. The patient is concerned about intermittent cramp in right thigh despite eating more bananas.  Set-up films were reviewed. The chart was checked. The patient projected a health mental status and was not accompanied by family for today's radiation oncology appointment.    Physical Findings  weight is 194 lb 1.6 oz (88.043 kg). His blood pressure is 110/71 and his pulse is 72. His respiration is 16. . Weight essentially stable. There is no significant changes to the status of the patients overall health to be noted at this time. He is alert and oriented x3.   Impression Larry [name] is a 64 year old male presenting to clinic in regards to his squamous cell carcinoma of lung, stage II, unspecified laterality. The patient is tolerating radiation. He understands the purpose of a seed-implant and that this procedure is to take place only once his radiation therapy treatments are complete.He understands that he can access his appointments and medical records via [place].   Plan The patient has been advised to continue treatment as planned. He has been encouraged to increase his intake in potassium to address his symptom of concern. Specific questions in relation to his future seed-implantation procedure were addressed. The patient is aware of his follow-up appointment to take place with radiation oncology next week as scheduled. All  vocalized questions and concerns have been addressed. If the patient develops any further questions or concerns in regards to his treatment and recovery, he has been encouraged to contact Dr. Tammi Klippel, MD.     .   This document serves as a record of services personally performed by Tyler Pita, MD. It was created on his behalf by Lenn Cal, a trained medical scribe. The creation of this record is based on the scribe's personal observations and the provider's statements to them. This document has been checked and approved by the attending provider.   ______________________________________________________________________    Sheral Apley. Tammi Klippel, M.D.

## 2015-02-14 NOTE — Progress Notes (Signed)
Weight and vitals stable. Denies pain. Reports nocturia x2. Denies urgency or incontinence. Denies frequency or dysuria. Denies diarrhea or fatigue. Concerned about intermittent cramp in right thigh despite eating more bananas.   BP 110/71 mmHg  Pulse 72  Resp 16  Wt 194 lb 1.6 oz (88.043 kg) Wt Readings from Last 3 Encounters:  02/14/15 194 lb 1.6 oz (88.043 kg)  02/06/15 192 lb (87.091 kg)  01/31/15 189 lb 8 oz (85.957 kg)

## 2015-02-17 ENCOUNTER — Ambulatory Visit
Admission: RE | Admit: 2015-02-17 | Discharge: 2015-02-17 | Disposition: A | Discharge status: Home / Self Care | Payer: Medicaid Other | Source: Ambulatory Visit | Attending: Radiation Oncology | Admitting: Radiation Oncology

## 2015-02-17 ENCOUNTER — Other Ambulatory Visit: Payer: Self-pay | Admitting: Urology

## 2015-02-17 DIAGNOSIS — C61 Malignant neoplasm of prostate: Secondary | ICD-10-CM | POA: Diagnosis not present

## 2015-02-18 ENCOUNTER — Ambulatory Visit
Admission: RE | Admit: 2015-02-18 | Discharge: 2015-02-18 | Disposition: A | Discharge status: Home / Self Care | Payer: Medicaid Other | Source: Ambulatory Visit | Attending: Radiation Oncology | Admitting: Radiation Oncology

## 2015-02-18 DIAGNOSIS — C61 Malignant neoplasm of prostate: Secondary | ICD-10-CM | POA: Diagnosis not present

## 2015-02-19 ENCOUNTER — Ambulatory Visit
Admission: RE | Admit: 2015-02-19 | Discharge: 2015-02-19 | Disposition: A | Discharge status: Home / Self Care | Payer: Medicare Other | Source: Ambulatory Visit | Attending: Radiation Oncology | Admitting: Radiation Oncology

## 2015-02-19 DIAGNOSIS — Z510 Encounter for antineoplastic radiation therapy: Secondary | ICD-10-CM | POA: Insufficient documentation

## 2015-02-19 DIAGNOSIS — C61 Malignant neoplasm of prostate: Secondary | ICD-10-CM | POA: Diagnosis not present

## 2015-02-20 ENCOUNTER — Ambulatory Visit
Admission: RE | Admit: 2015-02-20 | Discharge: 2015-02-20 | Disposition: A | Discharge status: Home / Self Care | Payer: Medicaid Other | Source: Ambulatory Visit | Attending: Radiation Oncology | Admitting: Radiation Oncology

## 2015-02-20 DIAGNOSIS — Z510 Encounter for antineoplastic radiation therapy: Secondary | ICD-10-CM | POA: Diagnosis not present

## 2015-02-21 ENCOUNTER — Ambulatory Visit
Admission: RE | Admit: 2015-02-21 | Discharge: 2015-02-21 | Disposition: A | Discharge status: Home / Self Care | Payer: Medicare Other | Source: Ambulatory Visit | Attending: Radiation Oncology | Admitting: Radiation Oncology

## 2015-02-21 ENCOUNTER — Ambulatory Visit
Admission: RE | Admit: 2015-02-21 | Discharge: 2015-02-21 | Disposition: A | Discharge status: Home / Self Care | Payer: Medicaid Other | Source: Ambulatory Visit | Attending: Radiation Oncology | Admitting: Radiation Oncology

## 2015-02-21 ENCOUNTER — Encounter: Payer: Self-pay | Admitting: Radiation Oncology

## 2015-02-21 VITALS — BP 124/78 | HR 77 | Resp 16 | Wt 196.7 lb

## 2015-02-21 DIAGNOSIS — C61 Malignant neoplasm of prostate: Secondary | ICD-10-CM

## 2015-02-21 DIAGNOSIS — Z510 Encounter for antineoplastic radiation therapy: Secondary | ICD-10-CM | POA: Diagnosis not present

## 2015-02-21 NOTE — Progress Notes (Signed)
  Radiation Oncology         [phone]     Name: Larry [name] MRN: [id number]   Date: 02/21/2015  DOB: 07-Jun-1950   Weekly Radiation Therapy Management    ICD-9-CM ICD-10-CM   1. Malignant neoplasm of prostate 185 C61     Current Dose: 32.4 Gy  Planned Dose:  45 Gy + seed implant  Narrative The patient presents for routine under treatment assessment. The patient's weight and vitals are currently stable. Denies pain. Reports nocturia x2. Denies urgency, incontinence, dysuria, diarrhea, or fatigue. Reports right thigh cramps that are less frequent and intense.   Physical Findings  weight is 196 lb 11.2 oz (89.223 kg). His blood pressure is 124/78 and his pulse is 77. His respiration is 16 and oxygen saturation is 100%. . Weight essentially stable. There is no significant changes to the status of the patients overall health to be noted at this time. He is alert and oriented x3.   Impression Larry [name] is a 64 year old male presenting to clinic in regards to malignant neoplasm of the prostate. The patient is tolerating radiation. He understands the purpose of a seed-implant and that this procedure is to take place only once his radiation therapy treatments are complete   Plan The patient has been advised to continue treatment as planned. The patient is aware of his follow-up appointment to take place with radiation oncology next week as scheduled. All vocalized questions and concerns have been addressed.    This document serves as a record of services personally performed by Tyler Pita, MD. It was created on his behalf by Darcus Austin, a trained medical scribe. The creation of this record is based on the scribe's personal observations and the Larry [name]'s statements to them. This document has been checked and approved by the attending Kearston Putman.     ______________________________________________________________________    Sheral Apley. Tammi Klippel, M.D.

## 2015-02-21 NOTE — Progress Notes (Addendum)
Weight and vitals stable. Denies pain. Reports nocturia x2. Denies urgency or incontinence. Denies frequency or dysuria. Denies diarrhea or fatigue. Reports right thigh cramps are less frequent and intense.   BP 124/78 mmHg  Pulse 77  Resp 16  Wt 196 lb 11.2 oz (89.223 kg)  SpO2 100% Wt Readings from Last 3 Encounters:  02/21/15 196 lb 11.2 oz (89.223 kg)  02/14/15 194 lb 1.6 oz (88.043 kg)  02/06/15 192 lb (87.091 kg)

## 2015-02-24 ENCOUNTER — Ambulatory Visit
Admission: RE | Admit: 2015-02-24 | Discharge: 2015-02-24 | Disposition: A | Discharge status: Home / Self Care | Payer: Medicaid Other | Source: Ambulatory Visit | Attending: Radiation Oncology | Admitting: Radiation Oncology

## 2015-02-24 DIAGNOSIS — Z510 Encounter for antineoplastic radiation therapy: Secondary | ICD-10-CM | POA: Diagnosis not present

## 2015-02-25 ENCOUNTER — Ambulatory Visit
Admission: RE | Admit: 2015-02-25 | Discharge: 2015-02-25 | Disposition: A | Discharge status: Home / Self Care | Payer: Medicaid Other | Source: Ambulatory Visit | Attending: Radiation Oncology | Admitting: Radiation Oncology

## 2015-02-25 DIAGNOSIS — Z510 Encounter for antineoplastic radiation therapy: Secondary | ICD-10-CM | POA: Diagnosis not present

## 2015-02-26 ENCOUNTER — Ambulatory Visit
Admission: RE | Admit: 2015-02-26 | Discharge: 2015-02-26 | Disposition: A | Discharge status: Home / Self Care | Payer: Medicaid Other | Source: Ambulatory Visit | Attending: Radiation Oncology | Admitting: Radiation Oncology

## 2015-02-26 DIAGNOSIS — Z510 Encounter for antineoplastic radiation therapy: Secondary | ICD-10-CM | POA: Diagnosis not present

## 2015-02-27 ENCOUNTER — Ambulatory Visit
Admission: RE | Admit: 2015-02-27 | Discharge: 2015-02-27 | Disposition: A | Discharge status: Home / Self Care | Payer: Medicaid Other | Source: Ambulatory Visit | Attending: Radiation Oncology | Admitting: Radiation Oncology

## 2015-02-27 DIAGNOSIS — Z510 Encounter for antineoplastic radiation therapy: Secondary | ICD-10-CM | POA: Diagnosis not present

## 2015-02-28 ENCOUNTER — Ambulatory Visit
Admission: RE | Admit: 2015-02-28 | Discharge: 2015-02-28 | Disposition: A | Discharge status: Home / Self Care | Payer: Medicare Other | Source: Ambulatory Visit | Attending: Radiation Oncology | Admitting: Radiation Oncology

## 2015-02-28 ENCOUNTER — Encounter: Payer: Self-pay | Admitting: Radiation Oncology

## 2015-02-28 VITALS — BP 117/94 | HR 78 | Resp 16 | Wt 193.0 lb

## 2015-02-28 DIAGNOSIS — Z510 Encounter for antineoplastic radiation therapy: Secondary | ICD-10-CM | POA: Diagnosis not present

## 2015-02-28 DIAGNOSIS — C61 Malignant neoplasm of prostate: Secondary | ICD-10-CM

## 2015-02-28 NOTE — Progress Notes (Signed)
  Radiation Oncology         [phone]     Name: Larry [name] MRN: [id number]   Date: 02/28/2015  DOB: 28-Apr-1951   Weekly Radiation Therapy Management    ICD-9-CM ICD-10-CM   1. Malignant neoplasm of prostate 185 C61     Current Dose: 41.4 Gy  Planned Dose:  45 Gy + seed implant  Narrative The patient presents for routine under treatment assessment. He has completed 23 fractions.   He denies symptoms of pain, frequency or dysuria, diarrhea, fatigue, urgency or incontinence. The patient reports symptoms of nocturia x 2. Completes external beam on Tuesday. The patient projected a healthy mental status and not was accompanied by family.    Physical Findings  weight is 193 lb (87.544 kg). His blood pressure is 117/94 and his pulse is 78. His respiration is 16.  Weight essentially stable. There is no significant changes to the status of overall health to be noted at this time. The patient is alert and oriented.   Impression Larry [name] is a 64 year old male presenting to clinic in regards to malignant neoplasm of the prostate. The patient is tolerating radiation.   He understands the purpose of a seed-implant and that this procedure is to take place only once his radiation therapy treatments are complete and the appropriate allotted time for healing has occurred. He understands the results of his most recent scans and when the seed-implant will take place. He understands the process, purpose, risks, and benefits of the seed-implant procedure in regards to the management of his disease and that he should not drive after the procedure. The patient understands that he can access his appointments and medical records via [facility].  Plan The patient has been advised to continue treatment as planned. Healthy methods of management in regards to reported symptoms were reviewed in detail. The patient is aware of his follow-up appointment to take place with radiation oncology next week as scheduled. The  results of his most recent scans were reviewed. What to expect during his seed-implant procedure was reviewed. He will not drive himself home after his seed-implant procedure takes place.   All vocalized questions and concerns have been addressed. If the patient develops any further questions or concerns in regards to his treatment and recovery, he has been encouraged to contact Dr. Tammi Klippel, MD.     This document serves as a record of services personally performed by Tyler Pita, MD. It was created on his behalf by Lenn Cal, a trained medical scribe. The creation of this record is based on the scribe's personal observations and the provider's statements to them. This document has been checked and approved by the attending provider.   ______________________________________________________________________    Sheral Apley. Tammi Klippel, M.D.

## 2015-02-28 NOTE — Progress Notes (Addendum)
Weight and vitals stable. Denies pain. Reports nocturia x 2. Denies urgency or incontinence. Denies frequency or dysuria. Denies diarrhea or fatigue. Completes external beam on Tuesday. Implant information given to patient today by Romie Jumper.  BP 117/94 mmHg  Pulse 78  Resp 16  Wt 193 lb (87.544 kg) Wt Readings from Last 3 Encounters:  02/28/15 193 lb (87.544 kg)  02/21/15 196 lb 11.2 oz (89.223 kg)  02/14/15 194 lb 1.6 oz (88.043 kg)

## 2015-03-03 ENCOUNTER — Ambulatory Visit
Admission: RE | Admit: 2015-03-03 | Discharge: 2015-03-03 | Disposition: A | Discharge status: Home / Self Care | Payer: Medicare Other | Source: Ambulatory Visit | Attending: Radiation Oncology | Admitting: Radiation Oncology

## 2015-03-03 DIAGNOSIS — C61 Malignant neoplasm of prostate: Secondary | ICD-10-CM | POA: Diagnosis present

## 2015-03-03 DIAGNOSIS — Z510 Encounter for antineoplastic radiation therapy: Secondary | ICD-10-CM | POA: Insufficient documentation

## 2015-03-04 ENCOUNTER — Ambulatory Visit
Admission: RE | Admit: 2015-03-04 | Discharge: 2015-03-04 | Disposition: A | Discharge status: Home / Self Care | Payer: Medicare Other | Source: Ambulatory Visit | Attending: Radiation Oncology | Admitting: Radiation Oncology

## 2015-03-04 ENCOUNTER — Encounter: Payer: Self-pay | Admitting: Radiation Oncology

## 2015-03-04 DIAGNOSIS — Z510 Encounter for antineoplastic radiation therapy: Secondary | ICD-10-CM | POA: Diagnosis not present

## 2015-03-10 NOTE — Progress Notes (Signed)
  Radiation Oncology         [phone] ________________________________  Name: Larry [name] MRN: [id number]  Date: 03/04/2015  DOB: 05/22/1951  End of Treatment Note  ICD-9-CM ICD-10-CM    1. Malignant neoplasm of prostate 185 C61     DIAGNOSIS: 64 y.o. gentleman with stage TIc adenocarcinoma of the prostate with a Gleason's score of 4+3 and a PSA of 4.1.     Indication for treatment:  Curative, Prostate Radiotherapy with seed boost  Radiation treatment dates:   01/28/2015-03/04/2015  Site/dose:   The prostate was treated to 45 Gy in 25 fractions of 1.8 Gy  Beams/energy:   The prostate was treated using 3D conformal radiotherapy with a 4-field technique 15 megavolt photons. Image guidance was performed with megavoltage CT studies prior to each fraction. He was immobilized with a body fix lower extremity mold.  Narrative: The patient tolerated radiation treatment relatively well.   Weight and vitals were stable. Denied pain. Reported nocturia x 2. Denied urgency or incontinence. Denied frequency or dysuria. Denied diarrhea or fatigue.   Plan: The patient has completed radiation treatment. He will return to radiation oncology clinic for routine followup in one month. I advised him to call or return sooner if he has any questions or concerns related to his recovery or treatment. ________________________________  Sheral Apley. Tammi Klippel, M.D.

## 2015-03-22 ENCOUNTER — Other Ambulatory Visit: Payer: Self-pay | Admitting: Internal Medicine

## 2015-03-24 NOTE — Telephone Encounter (Signed)
Last seen 10.2015 by CY, rec'd to follow up with [facility]. Pt is now overdue for appt

## 2015-03-26 ENCOUNTER — Telehealth: Payer: Self-pay | Admitting: *Deleted

## 2015-03-26 NOTE — Telephone Encounter (Signed)
Called patient to remind of lab for procedure (lab) 03/27/15, spoke with patient and he is aware of this appt.

## 2015-03-27 ENCOUNTER — Encounter (HOSPITAL_BASED_OUTPATIENT_CLINIC_OR_DEPARTMENT_OTHER): Payer: Self-pay | Admitting: *Deleted

## 2015-03-27 DIAGNOSIS — N529 Male erectile dysfunction, unspecified: Secondary | ICD-10-CM | POA: Diagnosis not present

## 2015-03-27 DIAGNOSIS — Z85118 Personal history of other malignant neoplasm of bronchus and lung: Secondary | ICD-10-CM | POA: Diagnosis not present

## 2015-03-27 DIAGNOSIS — E7800 Pure hypercholesterolemia, unspecified: Secondary | ICD-10-CM | POA: Diagnosis not present

## 2015-03-27 DIAGNOSIS — Z7951 Long term (current) use of inhaled steroids: Secondary | ICD-10-CM | POA: Diagnosis not present

## 2015-03-27 DIAGNOSIS — M1990 Unspecified osteoarthritis, unspecified site: Secondary | ICD-10-CM | POA: Diagnosis not present

## 2015-03-27 DIAGNOSIS — J449 Chronic obstructive pulmonary disease, unspecified: Secondary | ICD-10-CM | POA: Diagnosis not present

## 2015-03-27 DIAGNOSIS — F17200 Nicotine dependence, unspecified, uncomplicated: Secondary | ICD-10-CM | POA: Diagnosis not present

## 2015-03-27 DIAGNOSIS — Z79899 Other long term (current) drug therapy: Secondary | ICD-10-CM | POA: Diagnosis not present

## 2015-03-27 DIAGNOSIS — Z9221 Personal history of antineoplastic chemotherapy: Secondary | ICD-10-CM | POA: Diagnosis not present

## 2015-03-27 DIAGNOSIS — C61 Malignant neoplasm of prostate: Secondary | ICD-10-CM | POA: Diagnosis not present

## 2015-03-27 LAB — COMPREHENSIVE METABOLIC PANEL
ALK PHOS: 83 U/L (ref 38–126)
ALT: 21 U/L (ref 17–63)
AST: 22 U/L (ref 15–41)
Albumin: 4.6 g/dL (ref 3.5–5.0)
Anion gap: 10 (ref 5–15)
BUN: 13 mg/dL (ref 6–20)
CALCIUM: 9.9 mg/dL (ref 8.9–10.3)
CHLORIDE: 103 mmol/L (ref 101–111)
CO2: 27 mmol/L (ref 22–32)
CREATININE: 0.76 mg/dL (ref 0.61–1.24)
GFR calc non Af Amer: >60 mL/min (ref >60)
GLUCOSE: 113 mg/dL — AB (ref 65–99)
Potassium: 4.3 mmol/L (ref 3.5–5.1)
Sodium: 140 mmol/L (ref 135–145)
Total Bilirubin: 0.4 mg/dL (ref 0.3–1.2)
Total Protein: 8.4 g/dL — ABNORMAL HIGH (ref 6.5–8.1)

## 2015-03-27 LAB — CBC
HCT: 38.5 % — ABNORMAL LOW (ref 39.0–52.0)
Hemoglobin: 13.2 g/dL (ref 13.0–17.0)
MCH: 28 pg (ref 26.0–34.0)
MCHC: 34.3 g/dL (ref 30.0–36.0)
MCV: 81.6 fL (ref 78.0–100.0)
PLATELETS: 246 10*3/uL (ref 150–400)
RBC: 4.72 MIL/uL (ref 4.22–5.81)
RDW: 14.1 % (ref 11.5–15.5)
WBC: 8.6 10*3/uL (ref 4.0–10.5)

## 2015-03-27 LAB — PROTIME-INR
INR: 0.92 (ref 0.00–1.49)
Prothrombin Time: 12.5 seconds (ref 11.6–15.2)

## 2015-03-27 LAB — APTT: aPTT: 26 seconds (ref 24–37)

## 2015-03-27 NOTE — Progress Notes (Signed)
NPO AFTER MN.  ARRIVE AT 0600.  CURRENT LAB RESULTS , CXR, AND EKG IN CHART AND EPIC.  WILL TAKE PRAVASTATIN AND DO INHALER AM DOS W/ SIPS OF WATER AND DO FLEET ENEMA.

## 2015-03-31 ENCOUNTER — Other Ambulatory Visit: Payer: Self-pay | Admitting: Thoracic Surgery (Cardiothoracic Vascular Surgery)

## 2015-03-31 DIAGNOSIS — C3490 Malignant neoplasm of unspecified part of unspecified bronchus or lung: Secondary | ICD-10-CM

## 2015-04-01 ENCOUNTER — Ambulatory Visit
Admission: RE | Admit: 2015-04-01 | Discharge: 2015-04-01 | Disposition: A | Discharge status: Home / Self Care | Payer: Medicare Other | Source: Ambulatory Visit | Attending: Thoracic Surgery (Cardiothoracic Vascular Surgery) | Admitting: Thoracic Surgery (Cardiothoracic Vascular Surgery)

## 2015-04-01 ENCOUNTER — Encounter: Payer: Self-pay | Admitting: Thoracic Surgery (Cardiothoracic Vascular Surgery)

## 2015-04-01 ENCOUNTER — Ambulatory Visit (INDEPENDENT_AMBULATORY_CARE_PROVIDER_SITE_OTHER): Payer: Medicare Other | Admitting: Thoracic Surgery (Cardiothoracic Vascular Surgery)

## 2015-04-01 VITALS — BP 120/72 | HR 65 | Resp 16 | Ht 72.0 in | Wt 199.0 lb

## 2015-04-01 DIAGNOSIS — C3490 Malignant neoplasm of unspecified part of unspecified bronchus or lung: Secondary | ICD-10-CM

## 2015-04-01 DIAGNOSIS — Z902 Acquired absence of lung [part of]: Secondary | ICD-10-CM

## 2015-04-01 DIAGNOSIS — C3412 Malignant neoplasm of upper lobe, left bronchus or lung: Secondary | ICD-10-CM

## 2015-04-01 NOTE — Progress Notes (Signed)
Larry [name]       [place] [address]             [phone]       HPI: Larry [name] returns today for a scheduled 6 month follow-up visit.  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 He had neo-adjuvant chemotherapy with carboplatin and Taxol, followed by a thoracoscopic left upper lobectomy on 09/09/2014. His final stage was ypT1a, N1- stage IIA. He did not require postoperative adjuvant chemotherapy.  He was last in the office on May 10. He was recovering well from surgery at that time.  He denies pain. His chronic cough is unchanged. He denies any hemoptysis. Hoarseness slightly better. He is still smoking 2-3 cigarettes a day.  Past Medical History  Diagnosis Date  . Hyperlipidemia   . COPD (chronic obstructive pulmonary disease) ([place])   . Chronic cough   . Non-small cell carcinoma of left lung [facility]) oncologist-- dr Julien Nordmann    dx Nov 2015left upper lobe, 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, N1, M0)  s/p  left upper lobectomy with node dissection and neoadjuvant systemic chemotherapy  . Prostate cancer [facility]) urologist-- dr wrenn/  oncologsit-  dr Tammi Klippel    Stage T1c , Gleason 4+3, PSA 4.1, vol 26.73cc--  External RXT complete and Radiactive seed implants on 04-03-2015  . Productive cough     intermittantly  . Dyspnea on exertion   . Full dentures     Past Surgical History  Procedure Laterality Date  . Colonoscopy  12/ 2009  . Endobronchial ultrasound Bilateral 04/08/2014    Procedure: ENDOBRONCHIAL ULTRASOUND;  Surgeon: Kathee Delton, MD;  Location: [facility];  Service: Cardiopulmonary;  Laterality: Bilateral;  . Video bronchoscopy with endobronchial navigation Right 04/24/2014    Procedure: VIDEO BRONCHOSCOPY WITH ENDOBRONCHIAL NAVIGATION;  Surgeon: Collene Gobble, MD;  Location: [place];  Service: Thoracic;  Laterality: Right;  . Video bronchoscopy with endobronchial ultrasound Right 04/24/2014   Procedure: VIDEO BRONCHOSCOPY WITH ENDOBRONCHIAL ULTRASOUND;  Surgeon: Collene Gobble, MD;  Location: [place];  Service: Thoracic;  Laterality: Right;  . Video assisted thoracoscopy (vats)/ lobectomy Left 09/09/2014    Procedure: LEFT VIDEO ASSISTED THORACOSCOPY (VATS) with LEFT UPPER LOBECTOMY;  Surgeon: Melrose Nakayama, MD;  Location: [place];  Service: Thoracic;  Laterality: Left;     Current Outpatient Prescriptions  Medication Sig Dispense Refill  . pravastatin (PRAVACHOL) 80 MG tablet Take 80 mg by mouth every morning.   1  . SYMBICORT 160-4.5 MCG/ACT inhaler INHALE 2 PUFFS INTO THE LUNGS 2 (TWO) TIMES DAILY. 10.2 Inhaler 2   No current facility-administered medications for this visit.    Physical Exam BP 120/72 mmHg  Pulse 65  Resp 16  Ht 6' (1.829 m)  Wt 199 lb (90.266 kg)  BMI 26.98 kg/m2  SpO82 68% 64 year old man in no acute distress Alert and oriented 3 with no focal deficits No cervical or supraclavicular adenopathy Lungs clear equal breath sounds bilaterally  Diagnostic Tests: I personally reviewed his chest x-ray. Shows no evidence recurrent disease.  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 He is doing well at this time. He has no evidence of recurrent disease. He has no residual pain related to his surgery.  He has an appointment with Dr. Julien Nordmann in December and  will have a CT scan at that time.  Plan: I will see him back in 7 months with a PA and lateral chest x-ray for his one-year follow-up visit.  I spent 5 minutes with Larry [name] during this visit, greater than 50% was spent in counseling. Melrose Nakayama, MD [facility] [phone]

## 2015-04-02 ENCOUNTER — Telehealth: Payer: Self-pay | Admitting: *Deleted

## 2015-04-02 DIAGNOSIS — C61 Malignant neoplasm of prostate: Secondary | ICD-10-CM | POA: Diagnosis not present

## 2015-04-02 NOTE — Anesthesia Preprocedure Evaluation (Addendum)
Anesthesia Evaluation  Patient identified by MRN, date of birth, ID band Patient awake    Reviewed: Allergy & Precautions, Patient's Chart, lab work & pertinent test results  History of Anesthesia Complications (+) PONVNegative for: history of anesthetic complications  Airway Mallampati: I  TM Distance: >3 FB Neck ROM: Full    Dental  (+) Edentulous Upper, Edentulous Lower, Dental Advisory Given   Pulmonary COPD, Current Smoker,  Chest X-ray 04-01-15 IMPRESSION: COPD. Status post left upper lobectomy. There is no active cardiopulmonary disease.   Pulmonary exam normal        Cardiovascular Exercise Tolerance: Good negative cardio ROS Normal cardiovascular exam Rhythm:Regular Rate:Normal     Neuro/Psych negative neurological ROS  negative psych ROS   GI/Hepatic negative GI ROS, Neg liver ROS,   Endo/Other  negative endocrine ROS  Renal/GU negative Renal ROS     Musculoskeletal negative musculoskeletal ROS (+) Arthritis , Osteoarthritis,    Abdominal   Peds  Hematology negative hematology ROS (+)   Anesthesia Other Findings   Reproductive/Obstetrics                       Anesthesia Physical Anesthesia Plan  ASA: III  Anesthesia Plan: General   Post-op Pain Management:    Induction: Intravenous  Airway Management Planned: Oral ETT  Additional Equipment:   Intra-op Plan:   Post-operative Plan: Extubation in OR  Informed Consent: I have reviewed the patients History and Physical, chart, labs and discussed the procedure including the risks, benefits and alternatives for the proposed anesthesia with the patient or authorized representative who has indicated his/her understanding and acceptance.   Dental advisory given  Plan Discussed with: CRNA, Anesthesiologist and Surgeon  Anesthesia Plan Comments:       Anesthesia Quick Evaluation

## 2015-04-02 NOTE — H&P (Signed)
  History of Present Illness    Mr. Dibello returns today in f/u. He has seen Dr. Tammi Klippel for his history of a T1c Nx Mx Gleason 7(4+3) and EXRT with androgen ablation was recommended. His PSA is up to 5.01 from 4.1 and his prostate volume was 59m..Marland Kitchen He was last seen in October after a cancer consult to discuss options but his return was delayed by a subsequent diagnosis of lung cancer with a resection in April that showed a 1.9cm left poorly differentiated squamous cell cancer with negative margins and negative nodes.  He has just completed chemotherapy with Dr. MEarlie Server  He has no voiding difficulty..  He has erectile dysfunction.   Past Medical History Problems  1. History of hypercholesterolemia (Z86.39)  Surgical History Problems  1. History of Facial Surgery 2. History of Foot Surgery 3. History of Open Lung Biopsy  Current Meds 1. Pravastatin Sodium TABS;  Therapy: (Recorded:24Aug2015) to Recorded 2. Symbicort 160-4.5 MCG/ACT Inhalation Aerosol;  Therapy: (Recorded:15Jun2016) to Recorded  Allergies Medication  1. No Known Drug Allergies  Family History Problems  1. Family history of Acute Myocardial Infarction : Father 2. Family history of Death In The Family Father : Father   MIAge 98 3. Family history of Death In The Family Mother : Mother   Complications of DiabetesAge unknown 4. Family history of Diabetes Mellitus : Mother 5. Family history of kidney stones (Z84.1) : Sister 623 Family history of pneumonia (Z83.1) : Father 7. Family history of prostate cancer (Z80.42) : Brother 8. Family history of renal failure (Z84.1) : Brother 9. Family history of Hypertension : Mother  Social History Problems  1. Denied: Alcohol Use 2. Denied: History of Caffeine Use 3. Current every day smoker (F17.200) 4. Daily caffeine consumption, 2-3 servings a day 5. Marital History - Single 6. Occupation: 7. Tobacco Use   1 pack a day for 16 yrs  Vitals Vital Signs [Data  Includes: Last 1 Day]  Recorded: 27Jun2016 02:08PM  Blood Pressure: 128 / 82 Temperature: 98.6 F Heart Rate: 88  Assessment Assessed  1. Prostate cancer ([facility]  He has intermediate risk prostate cancer and needs androgen ablation for 6-8 months and EXRT.   Plan Health Maintenance  1. UA With REFLEX; [Do Not Release]; Status:In Progress - Specimen/Data Collected;    Done: [id number] cancer  2. Start: Levofloxacin 500 MG Oral Tablet (Levaquin); Take one tablet daily starting day  before procedure 3. Administer: Firmagon 120 MG Subcutaneous Solution Reconstituted; 120 mg  SQ rt and  left abdomen for total of 240 mg; To Be Done: 27Jun2016 4. GOLD SEEDS; Status:Hold For - Appointment,Date of Service; Requested  fFHL:[id number]  5. Follow-up Office  Follow-up  Status: Hold For - Appointment,Date of Service  Requested  for: 27Jun2016 6. Follow-up Office  Follow-up  Status: Hold For - Date of Service  Requested for:  27Jun2016  I discussed the side effects of androgen ablation and the gold seed implant with him.  He was given firmagon '120mg'$  sq x 2 today and a script for levaquin was sent.   He will need f/u for the gold seeds in 3 to 6 weeks and will start EXRT in about 2 months.  Time in consultation today was 15 min with >50% face to face discussing therapy.   Discussion/Summary    CC: Dr. MLedon Snare SPark CrestLong PA and Dr. MLorna Few

## 2015-04-02 NOTE — Telephone Encounter (Signed)
CALLED PATIENT TO REMIND OF IMPLANT  FOR 04-03-15 , SPOKE WITH PATIENT AND HE IS AWARE OF THIS PROCEDURE.

## 2015-04-03 ENCOUNTER — Ambulatory Visit (HOSPITAL_BASED_OUTPATIENT_CLINIC_OR_DEPARTMENT_OTHER)
Admission: RE | Admit: 2015-04-03 | Discharge: 2015-04-03 | Disposition: A | Discharge status: Home / Self Care | Payer: Medicare Other | Source: Ambulatory Visit | Attending: Urology | Admitting: Urology

## 2015-04-03 ENCOUNTER — Ambulatory Visit (HOSPITAL_COMMUNITY): Payer: Medicare Other

## 2015-04-03 ENCOUNTER — Encounter (HOSPITAL_BASED_OUTPATIENT_CLINIC_OR_DEPARTMENT_OTHER)
Admission: RE | Disposition: A | Discharge status: Home / Self Care | Payer: Self-pay | Source: Ambulatory Visit | Attending: Urology

## 2015-04-03 ENCOUNTER — Ambulatory Visit (HOSPITAL_BASED_OUTPATIENT_CLINIC_OR_DEPARTMENT_OTHER): Payer: Medicare Other | Admitting: Anesthesiology

## 2015-04-03 ENCOUNTER — Encounter (HOSPITAL_BASED_OUTPATIENT_CLINIC_OR_DEPARTMENT_OTHER): Payer: Self-pay | Admitting: *Deleted

## 2015-04-03 DIAGNOSIS — E7800 Pure hypercholesterolemia, unspecified: Secondary | ICD-10-CM | POA: Diagnosis not present

## 2015-04-03 DIAGNOSIS — Z923 Personal history of irradiation: Secondary | ICD-10-CM

## 2015-04-03 DIAGNOSIS — F17200 Nicotine dependence, unspecified, uncomplicated: Secondary | ICD-10-CM | POA: Insufficient documentation

## 2015-04-03 DIAGNOSIS — N529 Male erectile dysfunction, unspecified: Secondary | ICD-10-CM | POA: Insufficient documentation

## 2015-04-03 DIAGNOSIS — Z9221 Personal history of antineoplastic chemotherapy: Secondary | ICD-10-CM | POA: Diagnosis not present

## 2015-04-03 DIAGNOSIS — C61 Malignant neoplasm of prostate: Secondary | ICD-10-CM | POA: Diagnosis not present

## 2015-04-03 DIAGNOSIS — Z79899 Other long term (current) drug therapy: Secondary | ICD-10-CM | POA: Insufficient documentation

## 2015-04-03 DIAGNOSIS — Z85118 Personal history of other malignant neoplasm of bronchus and lung: Secondary | ICD-10-CM | POA: Insufficient documentation

## 2015-04-03 DIAGNOSIS — J449 Chronic obstructive pulmonary disease, unspecified: Secondary | ICD-10-CM | POA: Insufficient documentation

## 2015-04-03 DIAGNOSIS — M1990 Unspecified osteoarthritis, unspecified site: Secondary | ICD-10-CM | POA: Insufficient documentation

## 2015-04-03 DIAGNOSIS — Z7951 Long term (current) use of inhaled steroids: Secondary | ICD-10-CM | POA: Insufficient documentation

## 2015-04-03 HISTORY — DX: Chronic cough: R05.3

## 2015-04-03 HISTORY — DX: Other specified cough: R05.8

## 2015-04-03 HISTORY — DX: Cough: R05

## 2015-04-03 HISTORY — DX: Other forms of dyspnea: R06.09

## 2015-04-03 HISTORY — DX: Complete loss of teeth, unspecified cause, unspecified class: K08.109

## 2015-04-03 HISTORY — DX: Presence of dental prosthetic device (complete) (partial): Z97.2

## 2015-04-03 HISTORY — PX: RADIOACTIVE SEED IMPLANT: SHX5150

## 2015-04-03 HISTORY — DX: Dyspnea, unspecified: R06.00

## 2015-04-03 SURGERY — INSERTION, RADIATION SOURCE, PROSTATE
Anesthesia: General | Site: Prostate

## 2015-04-03 MED ORDER — FENTANYL CITRATE (PF) 100 MCG/2ML IJ SOLN
INTRAMUSCULAR | Status: DC | PRN
  Administered 2015-04-03: 50 ug via INTRAVENOUS

## 2015-04-03 MED ORDER — FENTANYL CITRATE (PF) 100 MCG/2ML IJ SOLN
25.0000 ug | INTRAMUSCULAR | Status: DC | PRN
  Filled 2015-04-03: qty 1

## 2015-04-03 MED ORDER — LIDOCAINE HCL (CARDIAC) 20 MG/ML IV SOLN
INTRAVENOUS | Status: DC | PRN
Start: 2015-04-03 — End: 2015-04-03
  Administered 2015-04-03: 100 mg via INTRAVENOUS

## 2015-04-03 MED ORDER — HYDROMORPHONE HCL 1 MG/ML IJ SOLN
0.2500 mg | INTRAMUSCULAR | Status: DC | PRN
  Filled 2015-04-03: qty 1

## 2015-04-03 MED ORDER — ONDANSETRON HCL 4 MG/2ML IJ SOLN
INTRAMUSCULAR | Status: DC | PRN
  Administered 2015-04-03: 4 mg via INTRAVENOUS

## 2015-04-03 MED ORDER — MIDAZOLAM HCL 2 MG/2ML IJ SOLN
INTRAMUSCULAR | Status: AC
  Filled 2015-04-03: qty 2

## 2015-04-03 MED ORDER — ACETAMINOPHEN 650 MG RE SUPP
650.0000 mg | RECTAL | Status: DC | PRN
  Filled 2015-04-03: qty 1

## 2015-04-03 MED ORDER — LACTATED RINGERS IV SOLN
INTRAVENOUS | Status: DC
  Administered 2015-04-03 (×3): via INTRAVENOUS
  Filled 2015-04-03: qty 1000

## 2015-04-03 MED ORDER — NEOSTIGMINE METHYLSULFATE 10 MG/10ML IV SOLN
INTRAVENOUS | Status: DC | PRN
  Administered 2015-04-03: 3 mg via INTRAVENOUS

## 2015-04-03 MED ORDER — SODIUM CHLORIDE 0.9 % IV SOLN
250.0000 mL | INTRAVENOUS | Status: DC | PRN
  Filled 2015-04-03: qty 250

## 2015-04-03 MED ORDER — ROCURONIUM BROMIDE 100 MG/10ML IV SOLN
INTRAVENOUS | Status: DC | PRN
  Administered 2015-04-03: 50 mg via INTRAVENOUS

## 2015-04-03 MED ORDER — FLEET ENEMA 7-19 GM/118ML RE ENEM
1.0000 | ENEMA | Freq: Once | RECTAL | Status: AC
  Administered 2015-04-03: 1 via RECTAL
  Filled 2015-04-03: qty 1

## 2015-04-03 MED ORDER — HYDROCODONE-ACETAMINOPHEN 5-325 MG PO TABS: 1.0000 | ORAL_TABLET | Freq: Four times a day (QID) | ORAL | Status: DC | PRN

## 2015-04-03 MED ORDER — CIPROFLOXACIN IN D5W 400 MG/200ML IV SOLN
INTRAVENOUS | Status: AC
  Filled 2015-04-03: qty 200

## 2015-04-03 MED ORDER — PROMETHAZINE HCL 25 MG/ML IJ SOLN
6.2500 mg | INTRAMUSCULAR | Status: DC | PRN
Start: 2015-04-03 — End: 2015-04-03
  Filled 2015-04-03: qty 1

## 2015-04-03 MED ORDER — PROPOFOL 10 MG/ML IV BOLUS
INTRAVENOUS | Status: DC | PRN
  Administered 2015-04-03: 200 mg via INTRAVENOUS

## 2015-04-03 MED ORDER — CIPROFLOXACIN IN D5W 400 MG/200ML IV SOLN
400.0000 mg | INTRAVENOUS | Status: AC
  Administered 2015-04-03: 400 mg via INTRAVENOUS
  Filled 2015-04-03: qty 200

## 2015-04-03 MED ORDER — DEXTROSE 5 % IV SOLN
10.0000 mg | INTRAVENOUS | Status: DC | PRN
  Administered 2015-04-03: 25 ug/min via INTRAVENOUS

## 2015-04-03 MED ORDER — SODIUM CHLORIDE 0.9 % IJ SOLN
3.0000 mL | INTRAMUSCULAR | Status: DC | PRN
  Filled 2015-04-03: qty 3

## 2015-04-03 MED ORDER — SUCCINYLCHOLINE CHLORIDE 20 MG/ML IJ SOLN
INTRAMUSCULAR | Status: DC | PRN
  Administered 2015-04-03: 100 mg via INTRAVENOUS

## 2015-04-03 MED ORDER — ACETAMINOPHEN 325 MG PO TABS
650.0000 mg | ORAL_TABLET | ORAL | Status: DC | PRN
  Filled 2015-04-03: qty 2

## 2015-04-03 MED ORDER — SODIUM CHLORIDE 0.9 % IJ SOLN
3.0000 mL | Freq: Two times a day (BID) | INTRAMUSCULAR | Status: DC
  Filled 2015-04-03: qty 3

## 2015-04-03 MED ORDER — KETOROLAC TROMETHAMINE 30 MG/ML IJ SOLN
INTRAMUSCULAR | Status: DC | PRN
  Administered 2015-04-03: 30 mg via INTRAVENOUS

## 2015-04-03 MED ORDER — IOHEXOL 350 MG/ML SOLN
INTRAVENOUS | Status: DC | PRN
  Administered 2015-04-03: 7 mL

## 2015-04-03 MED ORDER — FENTANYL CITRATE (PF) 100 MCG/2ML IJ SOLN
INTRAMUSCULAR | Status: AC
Start: 2015-04-03 — End: 2015-04-03
  Filled 2015-04-03: qty 4

## 2015-04-03 MED ORDER — GLYCOPYRROLATE 0.2 MG/ML IJ SOLN
INTRAMUSCULAR | Status: DC | PRN
  Administered 2015-04-03: 0.4 mg via INTRAVENOUS

## 2015-04-03 MED ORDER — CIPROFLOXACIN HCL 500 MG PO TABS: 500.0000 mg | ORAL_TABLET | Freq: Two times a day (BID) | ORAL | Status: DC

## 2015-04-03 MED ORDER — STERILE WATER FOR IRRIGATION IR SOLN
Status: DC | PRN
  Administered 2015-04-03: 500 mL

## 2015-04-03 MED ORDER — DEXAMETHASONE SODIUM PHOSPHATE 4 MG/ML IJ SOLN
INTRAMUSCULAR | Status: DC | PRN
  Administered 2015-04-03: 10 mg via INTRAVENOUS

## 2015-04-03 MED ORDER — OXYCODONE HCL 5 MG PO TABS
5.0000 mg | ORAL_TABLET | ORAL | Status: DC | PRN
  Filled 2015-04-03: qty 2

## 2015-04-03 SURGICAL SUPPLY — 29 items
BAG URINE DRAINAGE (UROLOGICAL SUPPLIES) ×3 IMPLANT
BLADE CLIPPER SURG (BLADE) ×5 IMPLANT
CATH FOLEY 2WAY SLVR  5CC 16FR (CATHETERS) ×2
CATH FOLEY 2WAY SLVR 5CC 16FR (CATHETERS) ×1 IMPLANT
CATH ROBINSON RED A/P 20FR (CATHETERS) ×3 IMPLANT
CLOTH BEACON ORANGE TIMEOUT ST (SAFETY) ×3 IMPLANT
COVER BACK TABLE 60X90IN (DRAPES) ×3 IMPLANT
COVER MAYO STAND STRL (DRAPES) ×3 IMPLANT
DRSG TEGADERM 4X4.75 (GAUZE/BANDAGES/DRESSINGS) ×3 IMPLANT
DRSG TEGADERM 8X12 (GAUZE/BANDAGES/DRESSINGS) ×3 IMPLANT
GLOVE ECLIPSE 8.0 STRL XLNG CF (GLOVE) ×4 IMPLANT
GLOVE SURG SS PI 8.0 STRL IVOR (GLOVE) ×6 IMPLANT
GOWN STRL REUS W/ TWL LRG LVL3 (GOWN DISPOSABLE) ×1 IMPLANT
GOWN STRL REUS W/ TWL XL LVL3 (GOWN DISPOSABLE) ×1 IMPLANT
GOWN STRL REUS W/TWL LRG LVL3 (GOWN DISPOSABLE) ×3
GOWN STRL REUS W/TWL XL LVL3 (GOWN DISPOSABLE) ×3
HOLDER FOLEY CATH W/STRAP (MISCELLANEOUS) ×1 IMPLANT
IV NS 1000ML (IV SOLUTION) ×3
IV NS 1000ML BAXH (IV SOLUTION) IMPLANT
KIT ROOM TURNOVER WOR (KITS) ×3 IMPLANT
Nucletron selectSeed 1-125 ×118 IMPLANT
PACK CYSTO (CUSTOM PROCEDURE TRAY) ×3 IMPLANT
SPONGE GAUZE 4X4 12PLY STER LF (GAUZE/BANDAGES/DRESSINGS) ×2 IMPLANT
SYRINGE 10CC LL (SYRINGE) ×3 IMPLANT
TUBE CONNECTING 12'X1/4 (SUCTIONS)
TUBE CONNECTING 12X1/4 (SUCTIONS) IMPLANT
UNDERPAD 30X30 INCONTINENT (UNDERPADS AND DIAPERS) ×6 IMPLANT
WATER STERILE IRR 3000ML UROMA (IV SOLUTION) ×1 IMPLANT
WATER STERILE IRR 500ML POUR (IV SOLUTION) ×3 IMPLANT

## 2015-04-03 NOTE — Op Note (Signed)
PATIENT:  Larry [name]  PRE-OPERATIVE DIAGNOSIS:  Adenocarcinoma of the prostate  POST-OPERATIVE DIAGNOSIS:  Same  PROCEDURE:  Procedure(s): 1. I-125 radioactive seed implantation 2. Cystoscopy  SURGEON:  Surgeon(s): Irine Seal MD  Radiation oncologist: Dr. Tyler Pita  ANESTHESIA:  General  EBL:  Minimal  DRAINS: 66 French Foley catheter  INDICATION: [name] is a 64 y.o. with Stage T1c, Gleason 7(4+3) prostate cancer who has elected brachytherapy for treatment.  He has completed EXRT and is on androgen deprivation therapy.  Description of procedure: After informed consent the patient was brought to the major OR, placed on the table and administered general anesthesia. He was then moved to the modified lithotomy position with his perineum perpendicular to the floor. His perineum and genitalia were then sterilely prepped. An official timeout was then performed. A 16 French Foley catheter was then placed in the bladder and filled with dilute contrast, a rectal tube was placed in the rectum and the transrectal ultrasound probe was placed in the rectum and affixed to the stand. He was then sterilely draped.  The sterile grid was installed.   Anchor needles were then placed.   Real time ultrasonography was used along with the seed planning software spot-pro version 3.1-00. This was used to develop the seed plan including the number of needles as well as number of seeds required for complete and adequate coverage. Real-time ultrasonography was then used along with the previously developed plan and the Nucletron device to implant a total of 59 seeds using 19 needles for a target dose of 145 Gy. This proceeded without difficulty or complication.  A Foley catheter was then removed as well as the transrectal ultrasound probe and rectal probe. Flexible cystoscopy was then performed using the 17 French flexible scope which revealed a normal urethra throughout its length down to the  sphincter which appeared intact. The prostatic urethra was 2cm with bilobar hyperplasia. The bladder was then entered and fully and systematically inspected.  The ureteral orifices were noted to be of normal configuration and position. The mucosa revealed no evidence of tumors. There were also no stones identified within the bladder.  No seeds or spacers were seen and/or removed from the bladder.  The cystoscope was then removed.  The drapes were removed.  The perineum was cleaned and dressed.  He was taken out of the lithotomy position and was awakened and taken to recovery room in stable and satisfactory condition. He tolerated procedure well and there were no intraoperative complications.

## 2015-04-03 NOTE — Transfer of Care (Signed)
Immediate Anesthesia Transfer of Care Note  Patient: Larry [name]  Procedure(s) Performed: Procedure(s): RADIOACTIVE SEED IMPLANT/BRACHYTHERAPY IMPLANT (N/A)  Patient Location: PACU  Anesthesia Type:General  Level of Consciousness: awake, alert , oriented and patient cooperative  Airway & Oxygen Therapy: Patient Spontanous Breathing and Patient connected to nasal cannula oxygen  Post-op Assessment: Report given to RN and Post -op Vital signs reviewed and stable  Post vital signs: Reviewed and stable  Last Vitals:  Filed Vitals:   04/03/15 0607  BP: 138/71  Pulse: 70  Temp: 36.7 C  Resp: 16    Complications: No apparent anesthesia complications

## 2015-04-03 NOTE — Anesthesia Postprocedure Evaluation (Signed)
Anesthesia Post Note  Patient: Larry [name]  Procedure(s) Performed: Procedure(s) (LRB): RADIOACTIVE SEED IMPLANT/BRACHYTHERAPY IMPLANT (N/A)  Anesthesia type: general  Patient location: PACU  Post pain: Pain level controlled  Post assessment: Patient's Cardiovascular Status Stable  Last Vitals:  Filed Vitals:   04/03/15 0930  BP: 154/77  Pulse: 73  Temp:   Resp: 19    Post vital signs: Reviewed and stable  Level of consciousness: sedated  Complications: No apparent anesthesia complications

## 2015-04-03 NOTE — Discharge Instructions (Addendum)
Brachytherapy for Prostate Cancer, Care After Refer to this sheet in the next few weeks. These instructions provide you with information on caring for yourself after your procedure. Your health care provider may also give you more specific instructions. Your treatment has been planned according to current medical practices, but problems sometimes occur. Call your health care provider if you have any problems or questions after your procedure. WHAT TO EXPECT AFTER THE PROCEDURE The area behind the scrotum will probably be tender and bruised. For a short period of time you may have:  Difficulty passing urine. You may need a catheter for a few days to a month.  Blood in the urine or semen.  A feeling of constipation because of prostate swelling.  Frequent feeling of an urgent need to urinate. For a long period of time you may have:  Inflammation of the rectum. This happens in about 2% of people who have the procedure.  Erection problems. These vary with age and occur in about 15-40% of men.  Difficulty urinating. This is caused by scarring in the urethra.  Diarrhea. HOME CARE INSTRUCTIONS   Take medicines only as directed by your health care provider.  You will probably have a catheter in your bladder for several days. You will have blood in the urine bag and should drink a lot of fluids to keep it a light red color.  Keep all follow-up visits as directed by your health care provider. If you have a catheter, it will be removed during one of these visits.  Try not to sit directly on the area behind the scrotum. A soft cushion can decrease the discomfort. Ice packs may also be helpful for the discomfort. Do not put ice directly on the skin.  Shower and wash the area behind the scrotum gently. Do not sit in a tub.  If you have had the brachytherapy that uses the seeds, limit your close contact with children and pregnant women for 2 months because of the radiation still in the prostate.  After that period of time, the levels drop off quickly. SEEK IMMEDIATE MEDICAL CARE IF:   You have a fever.  You have chills.  You have shortness of breath.  You have chest pain.  You have thick blood, like tomato juice, in the urine bag.  Your catheter is blocked so urine cannot get into the bag. Your bladder area or lower abdomen may be swollen.  There is excessive bleeding from your rectum. It is normal to have a little blood mixed with your stool.  There is severe discomfort in the treated area that does not go away with pain medicine.  You have abdominal discomfort.  You have severe nausea or vomiting.  You develop any new or unusual symptoms.   This information is not intended to replace advice given to you by your health care provider. Make sure you discuss any questions you have with your health care provider.   Document Released: 06/19/2010 Document Revised: 06/07/2014 Document Reviewed: 11/07/2012 Elsevier Interactive Patient Education 2016 [place] Anesthesia Home Care Instructions  Activity: Get plenty of rest for the remainder of the day. A responsible adult should stay with you for 24 hours following the procedure.  For the next 24 hours, DO NOT: -Drive a car -Paediatric nurse -Drink alcoholic beverages -Take any medication unless instructed by your physician -Make any legal decisions or sign important papers.  Meals: Start with liquid foods such as gelatin or soup. Progress to regular  foods as tolerated. Avoid greasy, spicy, heavy foods. If nausea and/or vomiting occur, drink only clear liquids until the nausea and/or vomiting subsides. Call your physician if vomiting continues.  Special Instructions/Symptoms: Your throat may feel dry or sore from the anesthesia or the breathing tube placed in your throat during surgery. If this causes discomfort, gargle with warm salt water. The discomfort should disappear within 24 hours.  If you had a  scopolamine patch placed behind your ear for the management of post- operative nausea and/or vomiting:  1. The medication in the patch is effective for 72 hours, after which it should be removed.  Wrap patch in a tissue and discard in the trash. Wash hands thoroughly with soap and water. 2. You may remove the patch earlier than 72 hours if you experience unpleasant side effects which may include dry mouth, dizziness or visual disturbances. 3. Avoid touching the patch. Wash your hands with soap and water after contact with the patch.

## 2015-04-03 NOTE — Interval H&P Note (Signed)
History and Physical Interval Note:  He has completed his EXRT and is voiding without complaints.  He is to have a seed boost today.   04/03/2015 7:13 AM  Lauralee Evener  has presented today for surgery, with the diagnosis of PRSTATE CANCER  The various methods of treatment have been discussed with the patient and family. After consideration of risks, benefits and other options for treatment, the patient has consented to  Procedure(s): RADIOACTIVE SEED IMPLANT/BRACHYTHERAPY IMPLANT (N/A) as a surgical intervention .  The patient's history has been reviewed, patient examined, no change in status, stable for surgery.  I have reviewed the patient's chart and labs.  Questions were answered to the patient's satisfaction.     Tamilyn Lupien J

## 2015-04-03 NOTE — Anesthesia Procedure Notes (Signed)
Procedure Name: Intubation Date/Time: 04/03/2015 7:34 AM Performed by: Wanita Chamberlain Pre-anesthesia Checklist: Patient identified, Emergency Drugs available, Suction available, Patient being monitored and Timeout performed Patient Re-evaluated:Patient Re-evaluated prior to inductionOxygen Delivery Method: Circle system utilized Preoxygenation: Pre-oxygenation with 100% oxygen Intubation Type: IV induction Ventilation: Mask ventilation without difficulty Laryngoscope Size: Mac and 4 Grade View: Grade I Tube type: Oral Tube size: 8.0 mm Number of attempts: 1 Airway Equipment and Method: Stylet Placement Confirmation: ETT inserted through vocal cords under direct vision,  breath sounds checked- equal and bilateral and positive ETCO2 Secured at: 23 cm Tube secured with: Tape

## 2015-04-03 NOTE — Progress Notes (Signed)
  Radiation Oncology         [phone]) [phone] ________________________________  Name: Larry [name] MRN: [id number]  Date: 04/03/2015  DOB: 07/30/1950       Prostate Seed Implant  JZ:PHXTAVWP, Luane School, NP  No ref. provider found  DIAGNOSIS: 64 y.o. 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 Prostate cancer 185 C61 CANCELED: DG Chest 2 View     CANCELED: DG Chest 2 View  2. Hx of brachytherapy V15.3 Z92.3 DG Abd 1 View     DG Abd 1 View    PROCEDURE: Insertion of radioactive I-125 seeds into the prostate gland.  RADIATION DOSE: 110 Gy, boost therapy.  TECHNIQUE: Larry [name] was brought to the operating room with the urologist. He was placed in the dorsolithotomy position. He was catheterized and a rectal tube was inserted. The perineum was shaved, prepped and draped. The ultrasound probe was then introduced into the rectum to see the prostate gland.  TREATMENT DEVICE: A needle grid was attached to the ultrasound probe stand and anchor needles were placed.  3D PLANNING: The prostate was imaged in 3D using a sagittal sweep of the prostate probe. These images were transferred to the planning computer. There, the prostate, urethra and rectum were defined on each axial reconstructed image. Then, the software created an optimized 3D plan and a few seed positions were adjusted. The quality of the plan was reviewed using [facility] information for the target and the following two organs at risk:  Urethra and Rectum.  Then the accepted plan was uploaded to the seed Selectron afterloading unit.  PROSTATE VOLUME STUDY:  Using transrectal ultrasound the volume of the prostate was verified to be 22.6 cc.  SPECIAL TREATMENT PROCEDURE/SUPERVISION AND HANDLING: The Nucletron FIRST system was used to place the needles under sagittal guidance. A total of 19 needles were used to deposit 59 seeds in the prostate gland. The  individual seed activity was 0.284 mCi.  COMPLEX SIMULATION: At the end of the procedure, an anterior radiograph of the pelvis was obtained to document seed positioning and count. Cystoscopy was performed to check the urethra and bladder.  MICRODOSIMETRY: At the end of the procedure, the patient was emitting 0.14 mR/hr at 1 meter. Accordingly, he was considered safe for hospital discharge.  PLAN: The patient will return to the radiation oncology clinic for post implant CT dosimetry in three weeks.   ________________________________  Sheral Apley Tammi Klippel, M.D.

## 2015-04-04 ENCOUNTER — Encounter (HOSPITAL_BASED_OUTPATIENT_CLINIC_OR_DEPARTMENT_OTHER): Payer: Self-pay | Admitting: Urology

## 2015-04-17 ENCOUNTER — Telehealth: Payer: Self-pay | Admitting: *Deleted

## 2015-04-17 NOTE — Telephone Encounter (Signed)
CALLED PATIENT TO REMIND OF APPTS. FOR 04-18-15 COFIRMED APPTS. WITH PATIENT

## 2015-04-18 ENCOUNTER — Ambulatory Visit
Admission: RE | Admit: 2015-04-18 | Discharge: 2015-04-18 | Disposition: A | Discharge status: Home / Self Care | Payer: Medicare Other | Source: Ambulatory Visit | Attending: Radiation Oncology | Admitting: Radiation Oncology

## 2015-04-18 ENCOUNTER — Encounter: Payer: Self-pay | Admitting: Radiation Oncology

## 2015-04-18 VITALS — BP 110/62 | HR 70 | Temp 98.3°F | Ht 72.0 in | Wt 203.2 lb

## 2015-04-18 DIAGNOSIS — Z510 Encounter for antineoplastic radiation therapy: Secondary | ICD-10-CM | POA: Diagnosis not present

## 2015-04-18 DIAGNOSIS — C61 Malignant neoplasm of prostate: Secondary | ICD-10-CM | POA: Diagnosis present

## 2015-04-18 NOTE — Progress Notes (Signed)
Radiation Oncology         [phone]) [phone] ________________________________  Name: Larry [name] MRN: [id number]  Date: 04/18/2015  DOB: 09/08/1950  Follow-Up Visit Note  CC: Millsaps, Luane School, NP  Curt Bears, MD  Diagnosis:   64 y.o. 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 Malignant neoplasm of prostate (HCC) 185 C61     Interval Since Last Radiation:  2  weeks   Narrative:  The patient returns today for routine follow-up.  He is complaining of increased urinary frequency and urinary hesitation symptoms. He filled out a questionnaire regarding urinary function today providing and overall IPSS score of 11 characterizing his symptoms as moderate.  His pre-implant score was 10. He denies any bowel symptoms.  Larry [name] here for reassessement s/p prostate seed implant on 04/03/15. He denies. He admits to intermittent stinging pain upon urination. Denies any straining to initiate his urinary stream but has a stop and start pattern. Denies any nocturia.   ALLERGIES:  has No Known Allergies.  Meds: Current Outpatient Prescriptions  Medication Sig Dispense Refill  . pravastatin (PRAVACHOL) 80 MG tablet Take 80 mg by mouth every morning.   1  . SYMBICORT 160-4.5 MCG/ACT inhaler INHALE 2 PUFFS INTO THE LUNGS 2 (TWO) TIMES DAILY. 10.2 Inhaler 2   No current facility-administered medications for this encounter.    Physical Findings: The patient is in no acute distress. Patient is alert and oriented.  height is 6' (1.829 m) and weight is 203 lb 3.2 oz (92.171 kg). His temperature is 98.3 F (36.8 C). His blood pressure is 110/62 and his pulse is 70. Marland Kitchen  No significant changes.   Lab Findings: Lab Results  Component Value Date   WBC 8.6 03/27/2015   HGB 13.2 03/27/2015   HCT 38.5* 03/27/2015   MCV 81.6 03/27/2015   PLT 246 03/27/2015    Radiographic Findings:  Patient underwent CT  imaging in our clinic for post implant dosimetry. The CT appears to demonstrate an adequate distribution of radioactive seeds throughout the prostate gland. There no seeds in her near the rectum. I suspect the final radiation plan and dosimetry will show appropriate coverage of the prostate gland.   Impression: The patient is recovering from the effects of radiation. His urinary symptoms should gradually improve over the next 4-6 months. We talked about this today. He is encouraged by his improvement already and is otherwise please with his outcome.   Plan: Today, I spent time talking to the patient about his prostate seed implant and resolving urinary symptoms. We also talked about long-term follow-up for prostate cancer following seed implant. He understands that ongoing PSA determinations and digital rectal exams will help perform surveillance to rule out disease recurrence. He understands what to expect with his PSA measures. Patient was also educated today about some of the long-term effects from radiation including a small risk for rectal bleeding and possibly erectile dysfunction. We talked about some of the general management approaches to these potential complications. However, I did encourage the patient to contact our office or return at any point if he has questions or concerns related to his previous radiation and prostate cancer.  _____________________________________  Sheral Apley. Tammi Klippel, M.D.   This document serves as a record of services personally performed by Tyler Pita, MD. It was created on his behalf by Arlyce Harman, a trained medical scribe.  The creation of this record is based on the scribe's personal observations and the provider's statements to them. This document has been checked and approved by the attending provider.

## 2015-04-18 NOTE — Progress Notes (Signed)
  Radiation Oncology         [phone] ________________________________  Name: Larry [name] MRN: [id number]  Date: 04/18/2015  DOB: Mar 12, 1951  COMPLEX SIMULATION NOTE  NARRATIVE:  The patient was brought to the [facility] today following prostate seed implantation approximately one month ago.  Identity was confirmed.  All relevant records and images related to the planned course of therapy were reviewed.  Then, the patient was set-up supine.  CT images were obtained.  The CT images were loaded into the planning software.  Then the prostate and rectum were contoured.  Treatment planning then occurred.  The implanted iodine 125 seeds were identified by the physics staff for projection of radiation distribution  I have requested : 3D Simulation  I have requested a DVH of the following structures: Prostate and rectum.    ________________________________  Sheral Apley Tammi Klippel, M.D.  This document serves as a record of services personally performed by Tyler Pita, MD. It was created on his behalf by Arlyce Harman, a trained medical scribe. The creation of this record is based on the scribe's personal observations and the provider's statements to them. This document has been checked and approved by the attending provider.

## 2015-04-18 NOTE — Progress Notes (Addendum)
Mr. Matsuo here for reassessement s/p prostate seed implant on 04/03/15.  He denies. He admits to intermittent stinging pain upon urination. Denies any straining to initiate his urinary stream but has a stop and start pattern.  Denies any nocturia.   BP 110/62 mmHg  Pulse 70  Temp(Src) 98.3 F (36.8 C)  Ht 6' (1.829 m)  Wt 203 lb 3.2 oz (92.171 kg)  BMI 27.55 kg/m2   Wt Readings from Last 3 Encounters:  04/18/15 203 lb 3.2 oz (92.171 kg)  04/03/15 192 lb (87.091 kg)  04/01/15 199 lb (90.266 kg)

## 2015-05-01 ENCOUNTER — Encounter: Payer: Self-pay | Admitting: Radiation Oncology

## 2015-05-01 DIAGNOSIS — Z510 Encounter for antineoplastic radiation therapy: Secondary | ICD-10-CM | POA: Diagnosis not present

## 2015-05-07 NOTE — Progress Notes (Signed)
  Radiation Oncology         [phone] ________________________________  Name: RIVERS HAMRICK MRN: [id number]  Date: 05/01/2015  DOB: 03/21/1951  3D Planning Note   Prostate Brachytherapy Post-Implant Dosimetry  Diagnosis: 64 y.o. 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 He underwent CT scan complex simulation to delineate the three-dimensional structures of the pelvis and demonstrate the radiation distribution.  Since that time, the seed localization, and complex isodose planning with dose volume histograms have now been completed.  Results:   Prostate Coverage - The dose of radiation delivered to the 90% or more of the prostate gland (D90) was 111.1% of the prescription dose. This exceeds our goal of greater than 90%. Rectal Sparing - The volume of rectal tissue receiving the prescription dose or higher was 0.0 cc. This falls under our thresholds tolerance of 1.0 cc.  Impression: The prostate seed implant appears to show adequate target coverage and appropriate rectal sparing.  Plan:  The patient will continue to follow with urology for ongoing PSA determinations. I would anticipate a high likelihood for local tumor control with minimal risk for rectal morbidity.  ________________________________  Sheral Apley Tammi Klippel, M.D.

## 2015-05-23 ENCOUNTER — Ambulatory Visit (HOSPITAL_COMMUNITY)
Admission: RE | Admit: 2015-05-23 | Discharge: 2015-05-23 | Disposition: A | Discharge status: Home / Self Care | Payer: Medicare Other | Source: Ambulatory Visit | Attending: Internal Medicine | Admitting: Internal Medicine

## 2015-05-23 ENCOUNTER — Other Ambulatory Visit (HOSPITAL_COMMUNITY)
Admission: AD | Admit: 2015-05-23 | Discharge: 2015-05-23 | Disposition: A | Discharge status: Home / Self Care | Payer: Medicare Other | Source: Ambulatory Visit | Attending: Internal Medicine | Admitting: Internal Medicine

## 2015-05-23 ENCOUNTER — Other Ambulatory Visit (HOSPITAL_BASED_OUTPATIENT_CLINIC_OR_DEPARTMENT_OTHER): Payer: Medicare Other

## 2015-05-23 ENCOUNTER — Encounter (HOSPITAL_COMMUNITY): Payer: Self-pay

## 2015-05-23 DIAGNOSIS — Z85118 Personal history of other malignant neoplasm of bronchus and lung: Secondary | ICD-10-CM | POA: Diagnosis not present

## 2015-05-23 DIAGNOSIS — C3490 Malignant neoplasm of unspecified part of unspecified bronchus or lung: Secondary | ICD-10-CM | POA: Insufficient documentation

## 2015-05-23 DIAGNOSIS — Z902 Acquired absence of lung [part of]: Secondary | ICD-10-CM | POA: Diagnosis not present

## 2015-05-23 DIAGNOSIS — J439 Emphysema, unspecified: Secondary | ICD-10-CM | POA: Diagnosis not present

## 2015-05-23 DIAGNOSIS — C3492 Malignant neoplasm of unspecified part of left bronchus or lung: Secondary | ICD-10-CM

## 2015-05-23 DIAGNOSIS — Z08 Encounter for follow-up examination after completed treatment for malignant neoplasm: Secondary | ICD-10-CM | POA: Diagnosis present

## 2015-05-23 LAB — CBC WITH DIFFERENTIAL/PLATELET
BASO%: 0.4 % (ref 0.0–2.0)
Basophils Absolute: 0 10*3/uL (ref 0.0–0.1)
EOS%: 3.5 % (ref 0.0–7.0)
Eosinophils Absolute: 0.3 10*3/uL (ref 0.0–0.5)
HEMATOCRIT: 38.3 % — AB (ref 38.4–49.9)
HGB: 13.2 g/dL (ref 13.0–17.1)
LYMPH#: 1.9 10*3/uL (ref 0.9–3.3)
LYMPH%: 24.7 % (ref 14.0–49.0)
MCH: 27.8 pg (ref 27.2–33.4)
MCHC: 34.5 g/dL (ref 32.0–36.0)
MCV: 80.8 fL (ref 79.3–98.0)
MONO#: 0.6 10*3/uL (ref 0.1–0.9)
MONO%: 7.7 % (ref 0.0–14.0)
NEUT#: 4.8 10*3/uL (ref 1.5–6.5)
NEUT%: 63.7 % (ref 39.0–75.0)
Platelets: 205 10*3/uL (ref 140–400)
RBC: 4.74 10*6/uL (ref 4.20–5.82)
RDW: 13.5 % (ref 11.0–14.6)
WBC: 7.5 10*3/uL (ref 4.0–10.3)

## 2015-05-23 LAB — COMPREHENSIVE METABOLIC PANEL
ALBUMIN: 4.8 g/dL (ref 3.5–5.0)
ALT: 14 U/L — ABNORMAL LOW (ref 17–63)
AST: 21 U/L (ref 15–41)
Alkaline Phosphatase: 87 U/L (ref 38–126)
Anion gap: 11 (ref 5–15)
BILIRUBIN TOTAL: 0.5 mg/dL (ref 0.3–1.2)
BUN: 10 mg/dL (ref 6–20)
CHLORIDE: 104 mmol/L (ref 101–111)
CO2: 26 mmol/L (ref 22–32)
CREATININE: 0.84 mg/dL (ref 0.61–1.24)
Calcium: 10 mg/dL (ref 8.9–10.3)
GFR calc Af Amer: >60 mL/min (ref >60)
GFR calc non Af Amer: >60 mL/min (ref >60)
GLUCOSE: 111 mg/dL — AB (ref 65–99)
POTASSIUM: 4.3 mmol/L (ref 3.5–5.1)
Sodium: 141 mmol/L (ref 135–145)
TOTAL PROTEIN: 8.6 g/dL — AB (ref 6.5–8.1)

## 2015-05-23 MED ORDER — IOHEXOL 300 MG/ML  SOLN
75.0000 mL | Freq: Once | INTRAMUSCULAR | Status: AC | PRN
  Administered 2015-05-23: 75 mL via INTRAVENOUS

## 2015-05-29 ENCOUNTER — Encounter: Payer: Self-pay | Admitting: Internal Medicine

## 2015-05-29 ENCOUNTER — Telehealth: Payer: Self-pay | Admitting: Internal Medicine

## 2015-05-29 ENCOUNTER — Ambulatory Visit (HOSPITAL_BASED_OUTPATIENT_CLINIC_OR_DEPARTMENT_OTHER): Payer: Medicare Other | Admitting: Internal Medicine

## 2015-05-29 VITALS — BP 128/78 | HR 81 | Resp 18 | Ht 72.0 in | Wt 206.4 lb

## 2015-05-29 DIAGNOSIS — C3412 Malignant neoplasm of upper lobe, left bronchus or lung: Secondary | ICD-10-CM

## 2015-05-29 DIAGNOSIS — Z8546 Personal history of malignant neoplasm of prostate: Secondary | ICD-10-CM

## 2015-05-29 DIAGNOSIS — C3492 Malignant neoplasm of unspecified part of left bronchus or lung: Secondary | ICD-10-CM

## 2015-05-29 DIAGNOSIS — C61 Malignant neoplasm of prostate: Secondary | ICD-10-CM

## 2015-05-29 NOTE — Telephone Encounter (Signed)
Gave patient avs report and appointments for June. [facility] will call patient re ct - patient aware.

## 2015-05-29 NOTE — Progress Notes (Signed)
[place] [phone]) [phone]   Fax:(336) [phone]  OFFICE PROGRESS NOTE  [name], NP [facility] [place] [id number]  DIAGNOSIS:  1) Stage IIA (T1a, N1, M0) non-small cell lung cancer, squamous cell carcinoma diagnosed in November 2015, presented with left suprahilar soft tissue mass. 2) history of prostate adenocarcinoma  PRIOR THERAPY:  1) Neoadjuvant systemic chemotherapy with carboplatin for AUC of 5 and paclitaxel 175 MG/M2 every 3 weeks with Neulasta support. First dose 06/13/2014. Status post 3 cycle. 2) Status post Left video-assisted thoracoscopy, Thoracoscopic left upper lobectomy, Mediastinal lymph node dissection under the care of Dr. Roxan Hockey on 09/09/2014.   CURRENT THERAPY: Observation.  INTERVAL HISTORY: Larry [name] 64 y.o. male returns to the clinic today for follow up visit. The patient is feeling fine today with no specific complaints except for mild weakness in the legs and increased frequency of urination after his diagnosis with prostate cancer. He denied having any significant nausea or vomiting. The patient denied having any significant chest pain, shortness of breath with exertion with no cough or hemoptysis. He denied having any significant weight loss or night sweats. He had repeat CT scan of the chest performed recently and he is here for evaluation and discussion of his scan results.  MEDICAL HISTORY: Past Medical History  Diagnosis Date  . Hyperlipidemia   . COPD (chronic obstructive pulmonary disease) ([place])   . Chronic cough   . Non-small cell carcinoma of left lung [facility]) oncologist-- dr Julien Nordmann    dx Nov 2015left upper lobe, 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, N1, M0)  s/p  left upper lobectomy with node dissection and neoadjuvant systemic chemotherapy  . Prostate cancer [facility]) urologist-- dr wrenn/  oncologsit-  dr Tammi Klippel    Stage T1c , Gleason 4+3, PSA 4.1, vol  26.73cc--  External RXT complete and Radiactive seed implants on 04-03-2015  . Productive cough     intermittantly  . Dyspnea on exertion   . Full dentures     ALLERGIES:  has No Known Allergies.  MEDICATIONS:  Current Outpatient Prescriptions  Medication Sig Dispense Refill  . pravastatin (PRAVACHOL) 80 MG tablet Take 80 mg by mouth every morning.   1  . SYMBICORT 160-4.5 MCG/ACT inhaler INHALE 2 PUFFS INTO THE LUNGS 2 (TWO) TIMES DAILY. 10.2 Inhaler 2   No current facility-administered medications for this visit.    SURGICAL HISTORY:  Past Surgical History  Procedure Laterality Date  . Colonoscopy  12/ 2009  . Endobronchial ultrasound Bilateral 04/08/2014    Procedure: ENDOBRONCHIAL ULTRASOUND;  Surgeon: Kathee Delton, MD;  [facility]: [facility];  Service: Cardiopulmonary;  Laterality: Bilateral;  . Video bronchoscopy with endobronchial navigation Right 04/24/2014    Procedure: VIDEO BRONCHOSCOPY WITH ENDOBRONCHIAL NAVIGATION;  Surgeon: Collene Gobble, MD;  Location: [place];  Service: Thoracic;  Laterality: Right;  . Video bronchoscopy with endobronchial ultrasound Right 04/24/2014    Procedure: VIDEO BRONCHOSCOPY WITH ENDOBRONCHIAL ULTRASOUND;  Surgeon: Collene Gobble, MD;  Location: [place];  Service: Thoracic;  Laterality: Right;  . Video assisted thoracoscopy (vats)/ lobectomy Left 09/09/2014    Procedure: LEFT VIDEO ASSISTED THORACOSCOPY (VATS) with LEFT UPPER LOBECTOMY;  Surgeon: Melrose Nakayama, MD;  Location: [place];  Service: Thoracic;  Laterality: Left;  . Radioactive seed implant N/A 04/03/2015    Procedure: RADIOACTIVE SEED IMPLANT/BRACHYTHERAPY IMPLANT;  Surgeon: Irine Seal, MD;  Location: [place];  Service: Urology;  Laterality: N/A;    REVIEW OF SYSTEMS:  A comprehensive review of systems was negative except for: Genitourinary: positive for frequency Musculoskeletal: positive for muscle weakness   PHYSICAL EXAMINATION: General appearance:  alert, cooperative, fatigued and no distress Head: Normocephalic, without obvious abnormality, atraumatic Neck: no adenopathy, no JVD, supple, symmetrical, trachea midline and thyroid not enlarged, symmetric, no tenderness/mass/nodules Lymph nodes: Cervical, supraclavicular, and axillary nodes normal. Resp: clear to auscultation bilaterally Back: symmetric, no curvature. ROM normal. No CVA tenderness. Cardio: regular rate and rhythm, S1, S2 normal, no murmur, click, rub or gallop GI: soft, non-tender; bowel sounds normal; no masses,  no organomegaly Extremities: extremities normal, atraumatic, no cyanosis or edema Neurologic: Alert and oriented X 3, normal strength and tone. Normal symmetric reflexes. Normal coordination and gait  ECOG PERFORMANCE STATUS: 1 - Symptomatic but completely ambulatory  Blood pressure 128/78, pulse 81, resp. rate 18, height 6' (1.829 m), weight 206 lb 6.4 oz (93.622 kg), SpO2 98 %.  LABORATORY DATA: Lab Results  Component Value Date   WBC 7.5 05/23/2015   HGB 13.2 05/23/2015   HCT 38.3* 05/23/2015   MCV 80.8 05/23/2015   PLT 205 05/23/2015      Chemistry      Component Value Date/Time   NA 141 05/23/2015 0950   NA 141 01/15/2015 0924   K 4.3 05/23/2015 0950   K 3.9 01/15/2015 0924   CL 104 05/23/2015 0950   CO2 26 05/23/2015 0950   CO2 25 01/15/2015 0924   BUN 10 05/23/2015 0950   BUN 11.5 01/15/2015 0924   CREATININE 0.84 05/23/2015 0950   CREATININE 0.8 01/15/2015 0924      Component Value Date/Time   CALCIUM 10.0 05/23/2015 0950   CALCIUM 9.3 01/15/2015 0924   ALKPHOS 87 05/23/2015 0950   ALKPHOS 83 01/15/2015 0924   AST 21 05/23/2015 0950   AST 16 01/15/2015 0924   ALT 14* 05/23/2015 0950   ALT 14 01/15/2015 0924   BILITOT 0.5 05/23/2015 0950   BILITOT 0.30 01/15/2015 0924       RADIOGRAPHIC STUDIES: Ct Chest W Contrast  05/23/2015  CLINICAL DATA:  Followup left lung squamous cell carcinoma. Restaging. EXAM: CT CHEST WITH  CONTRAST TECHNIQUE: Multidetector CT imaging of the chest was performed during intravenous contrast administration. CONTRAST:  91m OMNIPAQUE IOHEXOL 300 MG/ML  SOLN COMPARISON:  01/21/2015 FINDINGS: Mediastinum/Lymph Nodes: No masses, pathologically enlarged lymph nodes, or other significant abnormality. Lungs/Pleura: No pulmonary mass, infiltrate, or effusion. Postop changes from previous left upper lobectomy again demonstrated. Moderate emphysema again noted. Bibasilar scarring appears stable. Upper abdomen: No acute findings. Musculoskeletal: No chest wall mass or suspicious bone lesions identified. IMPRESSION: Stable exam.  No evidence of recurrent or metastatic carcinoma. Moderate emphysema.  No active lung disease. Electronically Signed   By: JEarle GellM.D.   On: 05/23/2015 13:15    ASSESSMENT AND PLAN: This is a very pleasant 64years old African-American male with potentially resectable stage IIA non-small cell lung cancer, squamous cell carcinoma. He is a status post neoadjuvant systemic chemotherapy with carboplatin and paclitaxel for 3 cycles followed by left upper lobectomy with lymph node dissection. The recent CT scan of the chest showed no evidence for disease recurrence. I discussed the scan results with the patient today. I recommended for him to continue on observation with repeat CT scan of the chest in 6 months. For the history of prostate adenocarcinoma, he will continue his routine follow-up visit with his urologist. He was advised  to call immediately if he has any concerning symptoms in the interval. The patient voices understanding of current disease status and treatment options and is in agreement with the current care plan.  All questions were answered. The patient knows to call the clinic with any problems, questions or concerns. We can certainly see the patient much sooner if necessary.  Disclaimer: This note was dictated with voice recognition software. Similar sounding  words can inadvertently be transcribed and may not be corrected upon review.

## 2015-06-05 ENCOUNTER — Other Ambulatory Visit: Payer: Self-pay | Admitting: Urology

## 2015-06-05 DIAGNOSIS — Z79899 Other long term (current) drug therapy: Secondary | ICD-10-CM

## 2015-06-17 ENCOUNTER — Other Ambulatory Visit: Payer: Self-pay | Admitting: Internal Medicine

## 2015-06-17 NOTE — Telephone Encounter (Signed)
CY Please advise on refill as patient has not been seen since 03-2014 but has been following up with Oncology. Thanks.

## 2015-06-17 NOTE — Telephone Encounter (Signed)
Ok to refill Symbicort x 1 year  Also suggest ROV here next several months as available, to maintain contact for his COPD

## 2015-07-03 ENCOUNTER — Ambulatory Visit
Admission: RE | Admit: 2015-07-03 | Discharge: 2015-07-03 | Disposition: A | Discharge status: Home / Self Care | Payer: Medicare Other | Source: Ambulatory Visit | Attending: Urology | Admitting: Urology

## 2015-07-03 DIAGNOSIS — Z79899 Other long term (current) drug therapy: Secondary | ICD-10-CM

## 2015-11-03 ENCOUNTER — Other Ambulatory Visit: Payer: Self-pay | Admitting: Thoracic Surgery (Cardiothoracic Vascular Surgery)

## 2015-11-03 DIAGNOSIS — C3490 Malignant neoplasm of unspecified part of unspecified bronchus or lung: Secondary | ICD-10-CM

## 2015-11-04 ENCOUNTER — Encounter: Payer: Self-pay | Admitting: Thoracic Surgery (Cardiothoracic Vascular Surgery)

## 2015-11-04 ENCOUNTER — Ambulatory Visit (INDEPENDENT_AMBULATORY_CARE_PROVIDER_SITE_OTHER): Payer: Medicare Other | Admitting: Thoracic Surgery (Cardiothoracic Vascular Surgery)

## 2015-11-04 ENCOUNTER — Ambulatory Visit
Admission: RE | Admit: 2015-11-04 | Discharge: 2015-11-04 | Disposition: A | Discharge status: Home / Self Care | Payer: Medicare Other | Source: Ambulatory Visit | Attending: Thoracic Surgery (Cardiothoracic Vascular Surgery) | Admitting: Thoracic Surgery (Cardiothoracic Vascular Surgery)

## 2015-11-04 VITALS — BP 140/80 | HR 76 | Resp 20 | Ht 72.0 in | Wt 206.0 lb

## 2015-11-04 DIAGNOSIS — Z902 Acquired absence of lung [part of]: Secondary | ICD-10-CM

## 2015-11-04 DIAGNOSIS — J449 Chronic obstructive pulmonary disease, unspecified: Secondary | ICD-10-CM

## 2015-11-04 DIAGNOSIS — C3492 Malignant neoplasm of unspecified part of left bronchus or lung: Secondary | ICD-10-CM

## 2015-11-04 DIAGNOSIS — C3490 Malignant neoplasm of unspecified part of unspecified bronchus or lung: Secondary | ICD-10-CM

## 2015-11-04 NOTE — Progress Notes (Signed)
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 Larry [name] had neo-adjuvant chemotherapy with carboplatin and Taxol, followed by a thoracoscopic left upper lobectomy on 09/09/2014. His final stage was ypT1a, N1- stage IIA. Larry [name] did not require postoperative adjuvant chemotherapy.  Larry [name] was last in the office in November 2016. Larry [name] was doing well at that time.  Larry [name] states that Larry [name] feels well. Larry [name] has not had any respiratory issues. Larry [name] does not have any residual pain from the surgery. His appetite is good. Larry [name] denies weight loss. Larry [name] has an occasional cough, denies hemoptysis. No unusual headaches or visual changes.  Larry [name] is currently smoking one half pack per day.  Past Medical History  Diagnosis Date  . Hyperlipidemia   . COPD (chronic obstructive pulmonary disease) ([place])   . Chronic cough   . Non-small cell carcinoma of left lung [facility]) oncologist-- dr Julien Nordmann    dx Nov 2015left upper lobe, 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, N1, M0)  s/p  left upper lobectomy with node dissection and neoadjuvant systemic chemotherapy  . Prostate cancer [facility]) urologist-- dr wrenn/  oncologsit-  dr Tammi Klippel    Stage T1c , Gleason 4+3, PSA 4.1, vol 26.73cc--  External RXT complete and Radiactive seed implants on 04-03-2015  . Productive cough     intermittantly  . Dyspnea on exertion   . Full dentures   . Stage II squamous cell carcinoma of left lung ([place]) 05/22/2014        Past Surgical History  Procedure Laterality Date  . Colonoscopy  12/ 2009  . Endobronchial ultrasound Bilateral 04/08/2014    Procedure: ENDOBRONCHIAL ULTRASOUND;  Surgeon: Kathee Delton, MD;  [facility];  Service: Cardiopulmonary;  Laterality: Bilateral;  . Video bronchoscopy with endobronchial navigation Right 04/24/2014   Procedure: VIDEO BRONCHOSCOPY WITH ENDOBRONCHIAL NAVIGATION;  Surgeon: Collene Gobble, MD;  Location: [place];  Service: Thoracic;  Laterality: Right;  . Video bronchoscopy with endobronchial ultrasound Right 04/24/2014    Procedure: VIDEO BRONCHOSCOPY WITH ENDOBRONCHIAL ULTRASOUND;  Surgeon: Collene Gobble, MD;  Location: [place];  Service: Thoracic;  Laterality: Right;  . Video assisted thoracoscopy (vats)/ lobectomy Left 09/09/2014    Procedure: LEFT VIDEO ASSISTED THORACOSCOPY (VATS) with LEFT UPPER LOBECTOMY;  Surgeon: Melrose Nakayama, MD;  Location: [place];  Service: Thoracic;  Laterality: Left;  . Radioactive seed implant N/A 04/03/2015    Procedure: RADIOACTIVE SEED IMPLANT/BRACHYTHERAPY IMPLANT;  Surgeon: Irine Seal, MD;  Location: [place];  Service: Urology;  Laterality: N/A;     Current Outpatient Prescriptions  Medication Sig Dispense Refill  . pravastatin (PRAVACHOL) 80 MG tablet Take 80 mg by mouth every morning.   1  . SYMBICORT 160-4.5 MCG/ACT inhaler INHALE 2 PUFFS INTO THE LUNGS 2 TIMES DAILY 10.2 Inhaler 11   No current facility-administered medications for this visit.    Physical Exam BP 140/80 mmHg  Pulse 76  Resp 20  Ht 6' (1.829 m)  Wt 206 lb (93.441 kg)  BMI 27.93 kg/m2  SpO41 63% Larry [name] in no acute distress Well-developed and well-nourished Alert and oriented 3 with no focal neurologic deficits Cardiac regular rate and rhythm  normal S1 and S2 Lungs mildly diminished left base, otherwise clear, no wheezing No cervical or subclavicular adenopathy Chest incisions well healed  Diagnostic Tests: CHEST 2 VIEW  COMPARISON: 04/01/2015  FINDINGS: Normal heart size, mediastinal contours, and pulmonary vascularity.  Post LEFT upper lobectomy with mild hyperinflation of LEFT lower lobe.  No acute infiltrate, pleural effusion, or pneumothorax.  Bones unremarkable.  IMPRESSION: No acute abnormalities.   Electronically  Signed  By: Lavonia Dana M.D.  On: 11/04/2015 08:51 I personally reviewed the chest x-ray and concur with the findings noted above.  Impression: Larry [name] who is a year out from being treated with neoadjuvant chemotherapy followed by thoracoscopic left upper lobectomy for stage II squamous cell carcinoma. Larry [name] is now a year out from treatment with no evidence of recurrent disease. Larry [name] has an appointment to have a CT scan and follow-up with Dr. Julien Nordmann later this month.  Smoking cessation instruction/counseling given:  counseled patient on the dangers of tobacco use, advised patient to stop smoking, and reviewed strategies to maximize successHe continues to smoke. I gave him our card with multiple numbers that Larry [name] can call for assistance with smoking cessation.  COPD- stable with no current issues  Plan: Follow-up with Dr. Julien Nordmann as scheduled. As Dr. Julien Nordmann will be following him on a regular basis, Larry [name] does not need to continue to come see me, but I am available at any time if I can be of any assistance with his care.   Melrose Nakayama, MD [facility] [phone]

## 2015-11-20 ENCOUNTER — Other Ambulatory Visit (HOSPITAL_BASED_OUTPATIENT_CLINIC_OR_DEPARTMENT_OTHER): Payer: Medicare Other

## 2015-11-20 DIAGNOSIS — C3412 Malignant neoplasm of upper lobe, left bronchus or lung: Secondary | ICD-10-CM

## 2015-11-20 DIAGNOSIS — C61 Malignant neoplasm of prostate: Secondary | ICD-10-CM

## 2015-11-20 DIAGNOSIS — C3492 Malignant neoplasm of unspecified part of left bronchus or lung: Secondary | ICD-10-CM

## 2015-11-20 LAB — CBC WITH DIFFERENTIAL/PLATELET
BASO%: 0.6 % (ref 0.0–2.0)
Basophils Absolute: 0.1 10*3/uL (ref 0.0–0.1)
EOS%: 2.6 % (ref 0.0–7.0)
Eosinophils Absolute: 0.2 10*3/uL (ref 0.0–0.5)
HCT: 38 % — ABNORMAL LOW (ref 38.4–49.9)
HGB: 12.7 g/dL — ABNORMAL LOW (ref 13.0–17.1)
LYMPH%: 18.3 % (ref 14.0–49.0)
MCH: 27.7 pg (ref 27.2–33.4)
MCHC: 33.5 g/dL (ref 32.0–36.0)
MCV: 82.5 fL (ref 79.3–98.0)
MONO#: 1 10*3/uL — ABNORMAL HIGH (ref 0.1–0.9)
MONO%: 11.3 % (ref 0.0–14.0)
NEUT%: 67.2 % (ref 39.0–75.0)
NEUTROS ABS: 5.9 10*3/uL (ref 1.5–6.5)
Platelets: 191 10*3/uL (ref 140–400)
RBC: 4.6 10*6/uL (ref 4.20–5.82)
RDW: 14.6 % (ref 11.0–14.6)
WBC: 8.8 10*3/uL (ref 4.0–10.3)
lymph#: 1.6 10*3/uL (ref 0.9–3.3)

## 2015-11-20 LAB — COMPREHENSIVE METABOLIC PANEL
ALT: 12 U/L (ref 0–55)
AST: 14 U/L (ref 5–34)
Albumin: 3.9 g/dL (ref 3.5–5.0)
Alkaline Phosphatase: 94 U/L (ref 40–150)
Anion Gap: 11 mEq/L (ref 3–11)
BILIRUBIN TOTAL: 0.32 mg/dL (ref 0.20–1.20)
BUN: 13.3 mg/dL (ref 7.0–26.0)
CO2: 23 meq/L (ref 22–29)
Calcium: 9.2 mg/dL (ref 8.4–10.4)
Chloride: 105 mEq/L (ref 98–109)
Creatinine: 0.9 mg/dL (ref 0.7–1.3)
GLUCOSE: 106 mg/dL (ref 70–140)
Potassium: 3.7 mEq/L (ref 3.5–5.1)
SODIUM: 139 meq/L (ref 136–145)
TOTAL PROTEIN: 7.4 g/dL (ref 6.4–8.3)

## 2015-11-21 ENCOUNTER — Ambulatory Visit (HOSPITAL_COMMUNITY)
Admission: RE | Admit: 2015-11-21 | Discharge: 2015-11-21 | Disposition: A | Discharge status: Home / Self Care | Payer: Medicare Other | Source: Ambulatory Visit | Attending: Internal Medicine | Admitting: Internal Medicine

## 2015-11-21 ENCOUNTER — Encounter (HOSPITAL_COMMUNITY): Payer: Self-pay

## 2015-11-21 DIAGNOSIS — C3492 Malignant neoplasm of unspecified part of left bronchus or lung: Secondary | ICD-10-CM | POA: Diagnosis not present

## 2015-11-21 DIAGNOSIS — I700 Atherosclerosis of aorta: Secondary | ICD-10-CM | POA: Insufficient documentation

## 2015-11-21 DIAGNOSIS — J439 Emphysema, unspecified: Secondary | ICD-10-CM | POA: Diagnosis not present

## 2015-11-21 DIAGNOSIS — C61 Malignant neoplasm of prostate: Secondary | ICD-10-CM | POA: Diagnosis present

## 2015-11-21 DIAGNOSIS — I2510 Atherosclerotic heart disease of native coronary artery without angina pectoris: Secondary | ICD-10-CM | POA: Insufficient documentation

## 2015-11-21 MED ORDER — IOPAMIDOL (ISOVUE-300) INJECTION 61%
75.0000 mL | Freq: Once | INTRAVENOUS | Status: AC | PRN
  Administered 2015-11-21: 75 mL via INTRAVENOUS

## 2015-11-27 ENCOUNTER — Telehealth: Payer: Self-pay | Admitting: Internal Medicine

## 2015-11-27 ENCOUNTER — Ambulatory Visit (HOSPITAL_BASED_OUTPATIENT_CLINIC_OR_DEPARTMENT_OTHER): Payer: Medicare Other | Admitting: Internal Medicine

## 2015-11-27 ENCOUNTER — Encounter: Payer: Self-pay | Admitting: Internal Medicine

## 2015-11-27 VITALS — BP 135/61 | HR 76 | Temp 98.7°F | Resp 18 | Ht 72.0 in | Wt 212.0 lb

## 2015-11-27 DIAGNOSIS — C3492 Malignant neoplasm of unspecified part of left bronchus or lung: Secondary | ICD-10-CM

## 2015-11-27 DIAGNOSIS — C61 Malignant neoplasm of prostate: Secondary | ICD-10-CM

## 2015-11-27 DIAGNOSIS — Z8546 Personal history of malignant neoplasm of prostate: Secondary | ICD-10-CM | POA: Diagnosis not present

## 2015-11-27 DIAGNOSIS — C3412 Malignant neoplasm of upper lobe, left bronchus or lung: Secondary | ICD-10-CM

## 2015-11-27 NOTE — Patient Instructions (Signed)
Smoking Cessation, Tips for Success If you are ready to quit smoking, congratulations! You have chosen to help yourself be healthier. Cigarettes bring nicotine, tar, carbon monoxide, and other irritants into your body. Your lungs, heart, and blood vessels will be able to work better without these poisons. There are many different ways to quit smoking. Nicotine gum, nicotine patches, a nicotine inhaler, or nicotine nasal spray can help with physical craving. Hypnosis, support groups, and medicines help break the habit of smoking. WHAT THINGS CAN I DO TO MAKE QUITTING EASIER?  Here are some tips to help you quit for good:  Pick a date when you will quit smoking completely. Tell all of your friends and family about your plan to quit on that date.  Do not try to slowly cut down on the number of cigarettes you are smoking. Pick a quit date and quit smoking completely starting on that day.  Throw away all cigarettes.   Clean and remove all ashtrays from your home, work, and car.  On a card, write down your reasons for quitting. Carry the card with you and read it when you get the urge to smoke.  Cleanse your body of nicotine. Drink enough water and fluids to keep your urine clear or pale yellow. Do this after quitting to flush the nicotine from your body.  Learn to predict your moods. Do not let a bad situation be your excuse to have a cigarette. Some situations in your life might tempt you into wanting a cigarette.  Never have "just one" cigarette. It leads to wanting another and another. Remind yourself of your decision to quit.  Change habits associated with smoking. If you smoked while driving or when feeling stressed, try other activities to replace smoking. Stand up when drinking your coffee. Brush your teeth after eating. Sit in a different chair when you read the paper. Avoid alcohol while trying to quit, and try to drink fewer caffeinated beverages. Alcohol and caffeine may urge you to  smoke.  Avoid foods and drinks that can trigger a desire to smoke, such as sugary or spicy foods and alcohol.  Ask people who smoke not to smoke around you.  Have something planned to do right after eating or having a cup of coffee. For example, plan to take a walk or exercise.  Try a relaxation exercise to calm you down and decrease your stress. Remember, you may be tense and nervous for the first 2 weeks after you quit, but this will pass.  Find new activities to keep your hands busy. Play with a pen, coin, or rubber band. Doodle or draw things on paper.  Brush your teeth right after eating. This will help cut down on the craving for the taste of tobacco after meals. You can also try mouthwash.   Use oral substitutes in place of cigarettes. Try using lemon drops, carrots, cinnamon sticks, or chewing gum. Keep them handy so they are available when you have the urge to smoke.  When you have the urge to smoke, try deep breathing.  Designate your home as a nonsmoking area.  If you are a heavy smoker, ask your health care provider about a prescription for nicotine chewing gum. It can ease your withdrawal from nicotine.  Reward yourself. Set aside the cigarette money you save and buy yourself something nice.  Look for support from others. Join a support group or smoking cessation program. Ask someone at home or at work to help you with your plan   to quit smoking.  Always ask yourself, "Do I need this cigarette or is this just a reflex?" Tell yourself, "Today, I choose not to smoke," or "I do not want to smoke." You are reminding yourself of your decision to quit.  Do not replace cigarette smoking with electronic cigarettes (commonly called e-cigarettes). The safety of e-cigarettes is unknown, and some may contain harmful chemicals.  If you relapse, do not give up! Plan ahead and think about what you will do the next time you get the urge to smoke. HOW WILL I FEEL WHEN I QUIT SMOKING? You  may have symptoms of withdrawal because your body is used to nicotine (the addictive substance in cigarettes). You may crave cigarettes, be irritable, feel very hungry, cough often, get headaches, or have difficulty concentrating. The withdrawal symptoms are only temporary. They are strongest when you first quit but will go away within 10-14 days. When withdrawal symptoms occur, stay in control. Think about your reasons for quitting. Remind yourself that these are signs that your body is healing and getting used to being without cigarettes. Remember that withdrawal symptoms are easier to treat than the major diseases that smoking can cause.  Even after the withdrawal is over, expect periodic urges to smoke. However, these cravings are generally short lived and will go away whether you smoke or not. Do not smoke! WHAT RESOURCES ARE AVAILABLE TO HELP ME QUIT SMOKING? Your health care provider can direct you to community resources or hospitals for support, which may include:  Group support.  Education.  Hypnosis.  Therapy.   This information is not intended to replace advice given to you by your health care provider. Make sure you discuss any questions you have with your health care provider.   Document Released: 02/13/2004 Document Revised: 06/07/2014 Document Reviewed: 11/02/2012 Elsevier Interactive Patient Education 2016 [employer]  

## 2015-11-27 NOTE — Progress Notes (Signed)
[place] [phone]   [phone]  OFFICE PROGRESS NOTE  LONG,SCOTT, PA-C [place] [address]  DIAGNOSIS:  1) Stage IIA (T1a, N1, M0) non-small cell lung cancer, squamous cell carcinoma diagnosed in November 2015, presented with left suprahilar soft tissue mass. 2) history of prostate adenocarcinoma  PRIOR THERAPY:  1) Neoadjuvant systemic chemotherapy with carboplatin for AUC of 5 and paclitaxel 175 MG/M2 every 3 weeks with Neulasta support. First dose 06/13/2014. Status post 3 cycle. 2) Status post Left video-assisted thoracoscopy, Thoracoscopic left upper lobectomy, Mediastinal lymph node dissection under the care of Dr. Roxan Hockey on 09/09/2014.   CURRENT THERAPY: Observation.  INTERVAL HISTORY: Larry [name] 65 y.o. male returns to the clinic today for follow up visit. The patient is feeling fine today with no specific complaints. He denied having any significant nausea or vomiting. The patient denied having any significant chest pain, shortness of breath with exertion with no cough or hemoptysis. He denied having any significant weight loss or night sweats. He gained 6 pounds since last visit. He had repeat CT scan of the chest performed recently and he is here for evaluation and discussion of his scan results.  MEDICAL HISTORY: Past Medical History  Diagnosis Date  . Hyperlipidemia   . COPD (chronic obstructive pulmonary disease) ([place])   . Chronic cough   . Non-small cell carcinoma of left lung [facility]) oncologist-- dr Julien Nordmann    dx Nov 2015left upper lobe, 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, N1, M0)  s/p  left upper lobectomy with node dissection and neoadjuvant systemic chemotherapy  . Prostate cancer [facility]) urologist-- dr wrenn/  oncologsit-  dr Tammi Klippel    Stage T1c , Gleason 4+3, PSA 4.1, vol 26.73cc--  External RXT complete and Radiactive seed implants on 04-03-2015  . Productive cough    intermittantly  . Dyspnea on exertion   . Full dentures   . Stage II squamous cell carcinoma of left lung ([place]) 05/22/2014         ALLERGIES:  has No Known Allergies.  MEDICATIONS:  Current Outpatient Prescriptions  Medication Sig Dispense Refill  . pravastatin (PRAVACHOL) 80 MG tablet Take 80 mg by mouth every morning.   1  . SYMBICORT 160-4.5 MCG/ACT inhaler INHALE 2 PUFFS INTO THE LUNGS 2 TIMES DAILY 10.2 Inhaler 11   No current facility-administered medications for this visit.    SURGICAL HISTORY:  Past Surgical History  Procedure Laterality Date  . Colonoscopy  12/ 2009  . Endobronchial ultrasound Bilateral 04/08/2014    Procedure: ENDOBRONCHIAL ULTRASOUND;  Surgeon: Kathee Delton, MD;  Location: [facility];  Service: Cardiopulmonary;  Laterality: Bilateral;  . Video bronchoscopy with endobronchial navigation Right 04/24/2014    Procedure: VIDEO BRONCHOSCOPY WITH ENDOBRONCHIAL NAVIGATION;  Surgeon: Collene Gobble, MD;  Location: [place];  Service: Thoracic;  Laterality: Right;  . Video bronchoscopy with endobronchial ultrasound Right 04/24/2014    Procedure: VIDEO BRONCHOSCOPY WITH ENDOBRONCHIAL ULTRASOUND;  Surgeon: Collene Gobble, MD;  Location: [place];  Service: Thoracic;  Laterality: Right;  . Video assisted thoracoscopy (vats)/ lobectomy Left 09/09/2014    Procedure: LEFT VIDEO ASSISTED THORACOSCOPY (VATS) with LEFT UPPER LOBECTOMY;  Surgeon: Melrose Nakayama, MD;  Location: [place];  Service: Thoracic;  Laterality: Left;  . Radioactive seed implant N/A 04/03/2015    Procedure: RADIOACTIVE SEED IMPLANT/BRACHYTHERAPY IMPLANT;  Surgeon: Irine Seal, MD;  Location: [place];  Service: Urology;  Laterality: N/A;  REVIEW OF SYSTEMS:  A comprehensive review of systems was negative.   PHYSICAL EXAMINATION: General appearance: alert, cooperative, fatigued and no distress Head: Normocephalic, without obvious abnormality, atraumatic Neck: no adenopathy, no JVD,  supple, symmetrical, trachea midline and thyroid not enlarged, symmetric, no tenderness/mass/nodules Lymph nodes: Cervical, supraclavicular, and axillary nodes normal. Resp: clear to auscultation bilaterally Back: symmetric, no curvature. ROM normal. No CVA tenderness. Cardio: regular rate and rhythm, S1, S2 normal, no murmur, click, rub or gallop GI: soft, non-tender; bowel sounds normal; no masses,  no organomegaly Extremities: extremities normal, atraumatic, no cyanosis or edema Neurologic: Alert and oriented X 3, normal strength and tone. Normal symmetric reflexes. Normal coordination and gait  ECOG PERFORMANCE STATUS: 1 - Symptomatic but completely ambulatory  Blood pressure 135/61, pulse 76, temperature 98.7 F (37.1 C), temperature source Oral, resp. rate 18, height 6' (1.829 m), weight 212 lb (96.163 kg), SpO2 98 %.  LABORATORY DATA: Lab Results  Component Value Date   WBC 8.8 11/20/2015   HGB 12.7* 11/20/2015   HCT 38.0* 11/20/2015   MCV 82.5 11/20/2015   PLT 191 11/20/2015      Chemistry      Component Value Date/Time   NA 139 11/20/2015 0757   NA 141 05/23/2015 0950   K 3.7 11/20/2015 0757   K 4.3 05/23/2015 0950   CL 104 05/23/2015 0950   CO2 23 11/20/2015 0757   CO2 26 05/23/2015 0950   BUN 13.3 11/20/2015 0757   BUN 10 05/23/2015 0950   CREATININE 0.9 11/20/2015 0757   CREATININE 0.84 05/23/2015 0950      Component Value Date/Time   CALCIUM 9.2 11/20/2015 0757   CALCIUM 10.0 05/23/2015 0950   ALKPHOS 94 11/20/2015 0757   ALKPHOS 87 05/23/2015 0950   AST 14 11/20/2015 0757   AST 21 05/23/2015 0950   ALT 12 11/20/2015 0757   ALT 14* 05/23/2015 0950   BILITOT 0.32 11/20/2015 0757   BILITOT 0.5 05/23/2015 0950       RADIOGRAPHIC STUDIES: Dg Chest 2 View  11/04/2015  CLINICAL DATA:  Lung cancer post VATS April 2016, COPD, history prostate cancer EXAM: CHEST  2 VIEW COMPARISON:  04/01/2015 FINDINGS: Normal heart size, mediastinal contours, and pulmonary  vascularity. Post LEFT upper lobectomy with mild hyperinflation of LEFT lower lobe. No acute infiltrate, pleural effusion, or pneumothorax. Bones unremarkable. IMPRESSION: No acute abnormalities. Electronically Signed   By: Lavonia Dana M.D.   On: 11/04/2015 08:51   Ct Chest W Contrast  11/21/2015  CLINICAL DATA:  Followup left lung squamous cell carcinoma. Previous left upper lobectomy. Prostate carcinoma. EXAM: CT CHEST WITH CONTRAST TECHNIQUE: Multidetector CT imaging of the chest was performed during intravenous contrast administration. CONTRAST:  36m ISOVUE-300 IOPAMIDOL (ISOVUE-300) INJECTION 61% COMPARISON:  05/23/2015 and 08/12/2014 FINDINGS: Mediastinum/Lymph Nodes: No masses, pathologically enlarged lymph nodes, or other acute abnormality. Aortic atherosclerosis noted. Coronary artery calcification noted. Lungs/Pleura: Postop changes from previous left upper lobectomy again demonstrated. Moderate emphysema again noted as well as bilateral scarring. No new or enlarging pulmonary nodules or masses identified. No evidence of pleural effusion or acute infiltrate. Upper abdomen: No acute findings. Musculoskeletal: No chest wall mass or suspicious bone lesions identified. IMPRESSION: Stable exam.  No evidence of recurrent or metastatic carcinoma. Stable moderate emphysema. Aortic atherosclerosis and coronary artery calcification again noted. Electronically Signed   By: JEarle GellM.D.   On: 11/21/2015 09:27    ASSESSMENT AND PLAN: This is a very pleasant 65years old African-American  male with potentially resectable stage IIA non-small cell lung cancer, squamous cell carcinoma. He is a status post neoadjuvant systemic chemotherapy with carboplatin and paclitaxel for 3 cycles followed by left upper lobectomy with lymph node dissection. The recent CT scan of the chest showed no evidence for disease recurrence. I discussed the scan results with the patient today. I recommended for him to continue on  observation with repeat CT scan of the chest in 6 months. For the history of prostate adenocarcinoma, he will continue his routine follow-up visit with his urologist. I will check his PSA in 6 months. He was advised to call immediately if he has any concerning symptoms in the interval. The patient voices understanding of current disease status and treatment options and is in agreement with the current care plan.  All questions were answered. The patient knows to call the clinic with any problems, questions or concerns. We can certainly see the patient much sooner if necessary.  Disclaimer: This note was dictated with voice recognition software. Similar sounding words can inadvertently be transcribed and may not be corrected upon review.

## 2015-11-27 NOTE — Telephone Encounter (Signed)
Gave and printed appts ched and avs for pt for DEC  °

## 2016-03-23 IMAGING — CR DG CHEST 2V
2 series · 2 of 2 positions shown · non-contrast
Comparison: CT chest 04/15/2014 and 08/12/2014.

CLINICAL DATA: Preoperative examination. Patient for VATS and
possible left upper lobectomy for lung cancer.

EXAM:
CHEST  2 VIEW

[w chest pa]
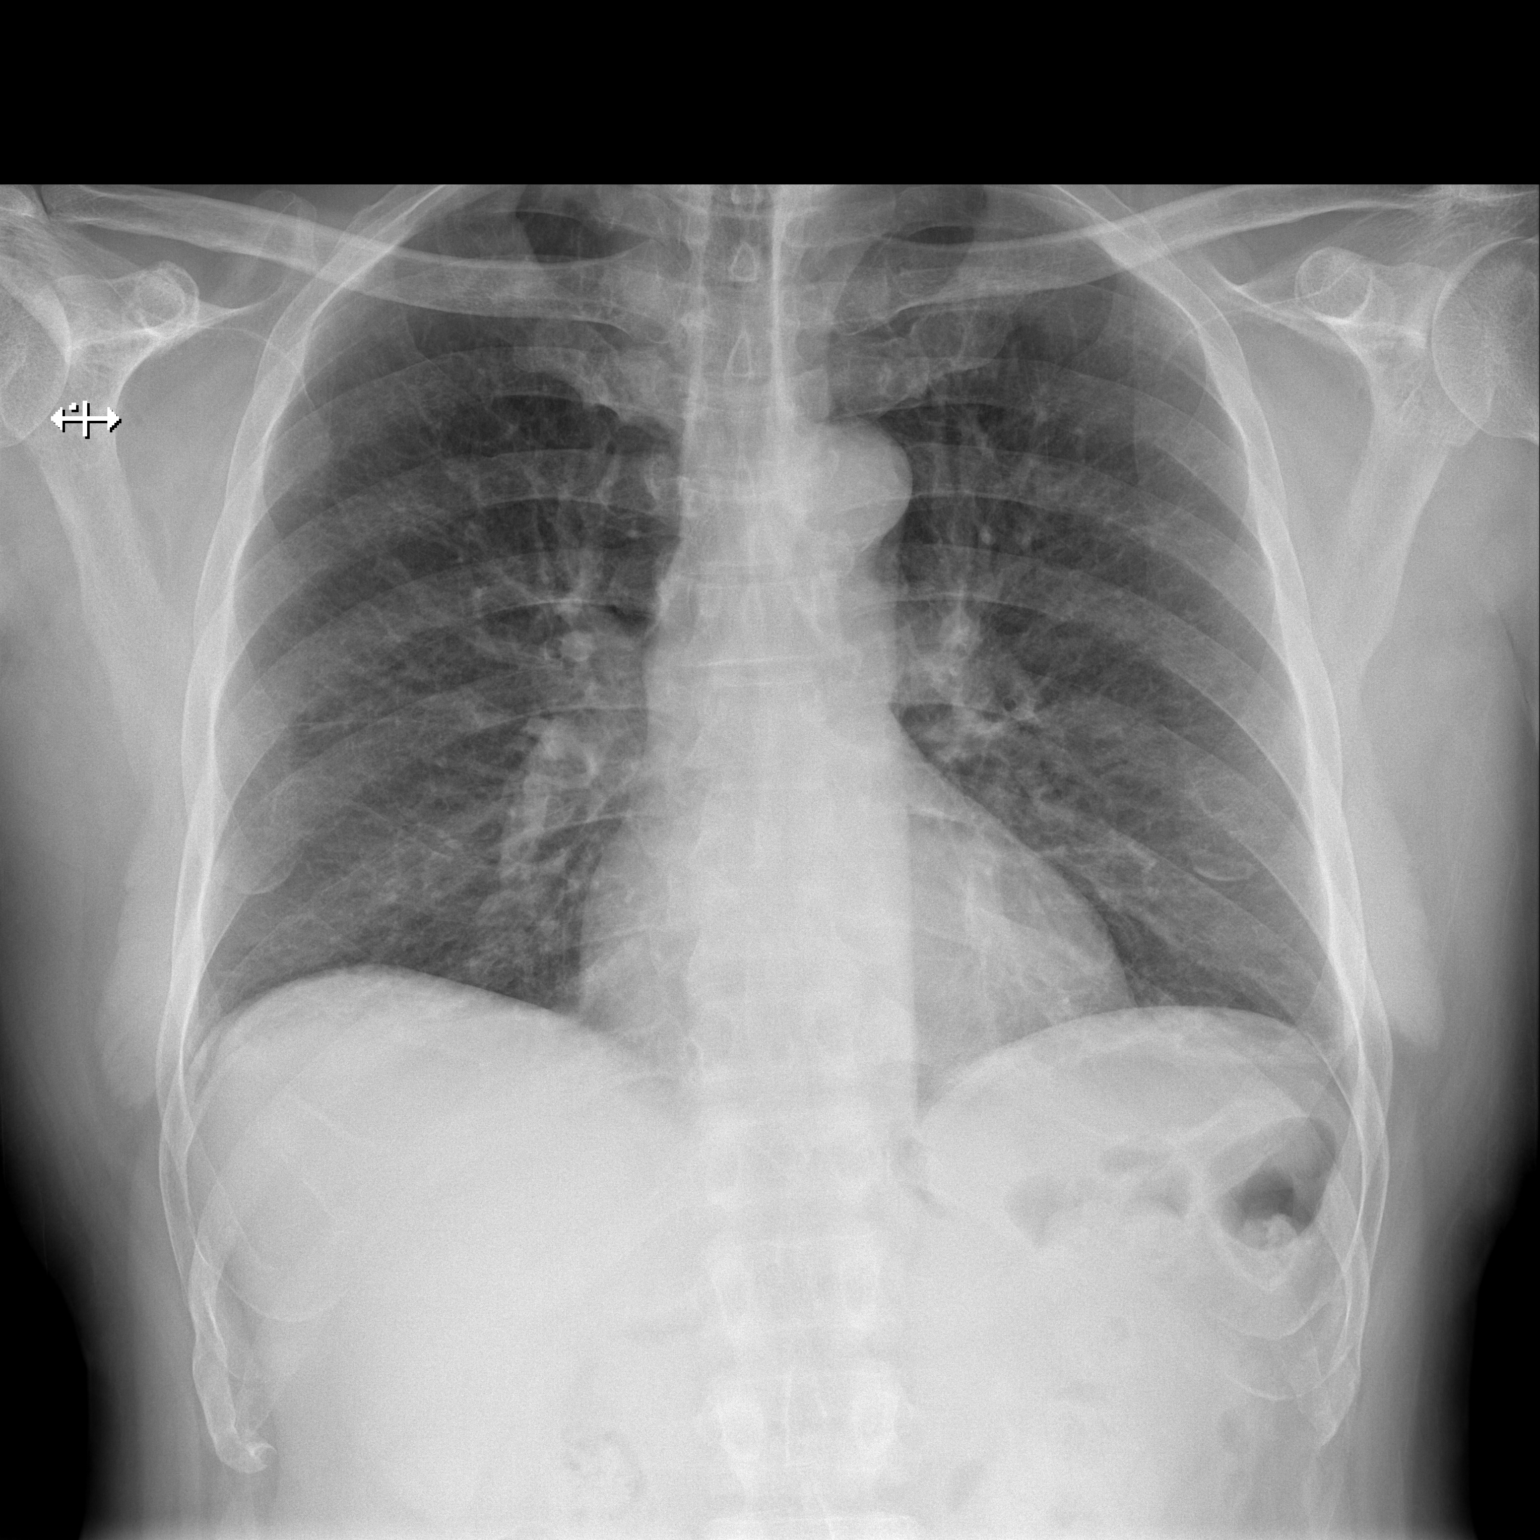

[w chest lat]
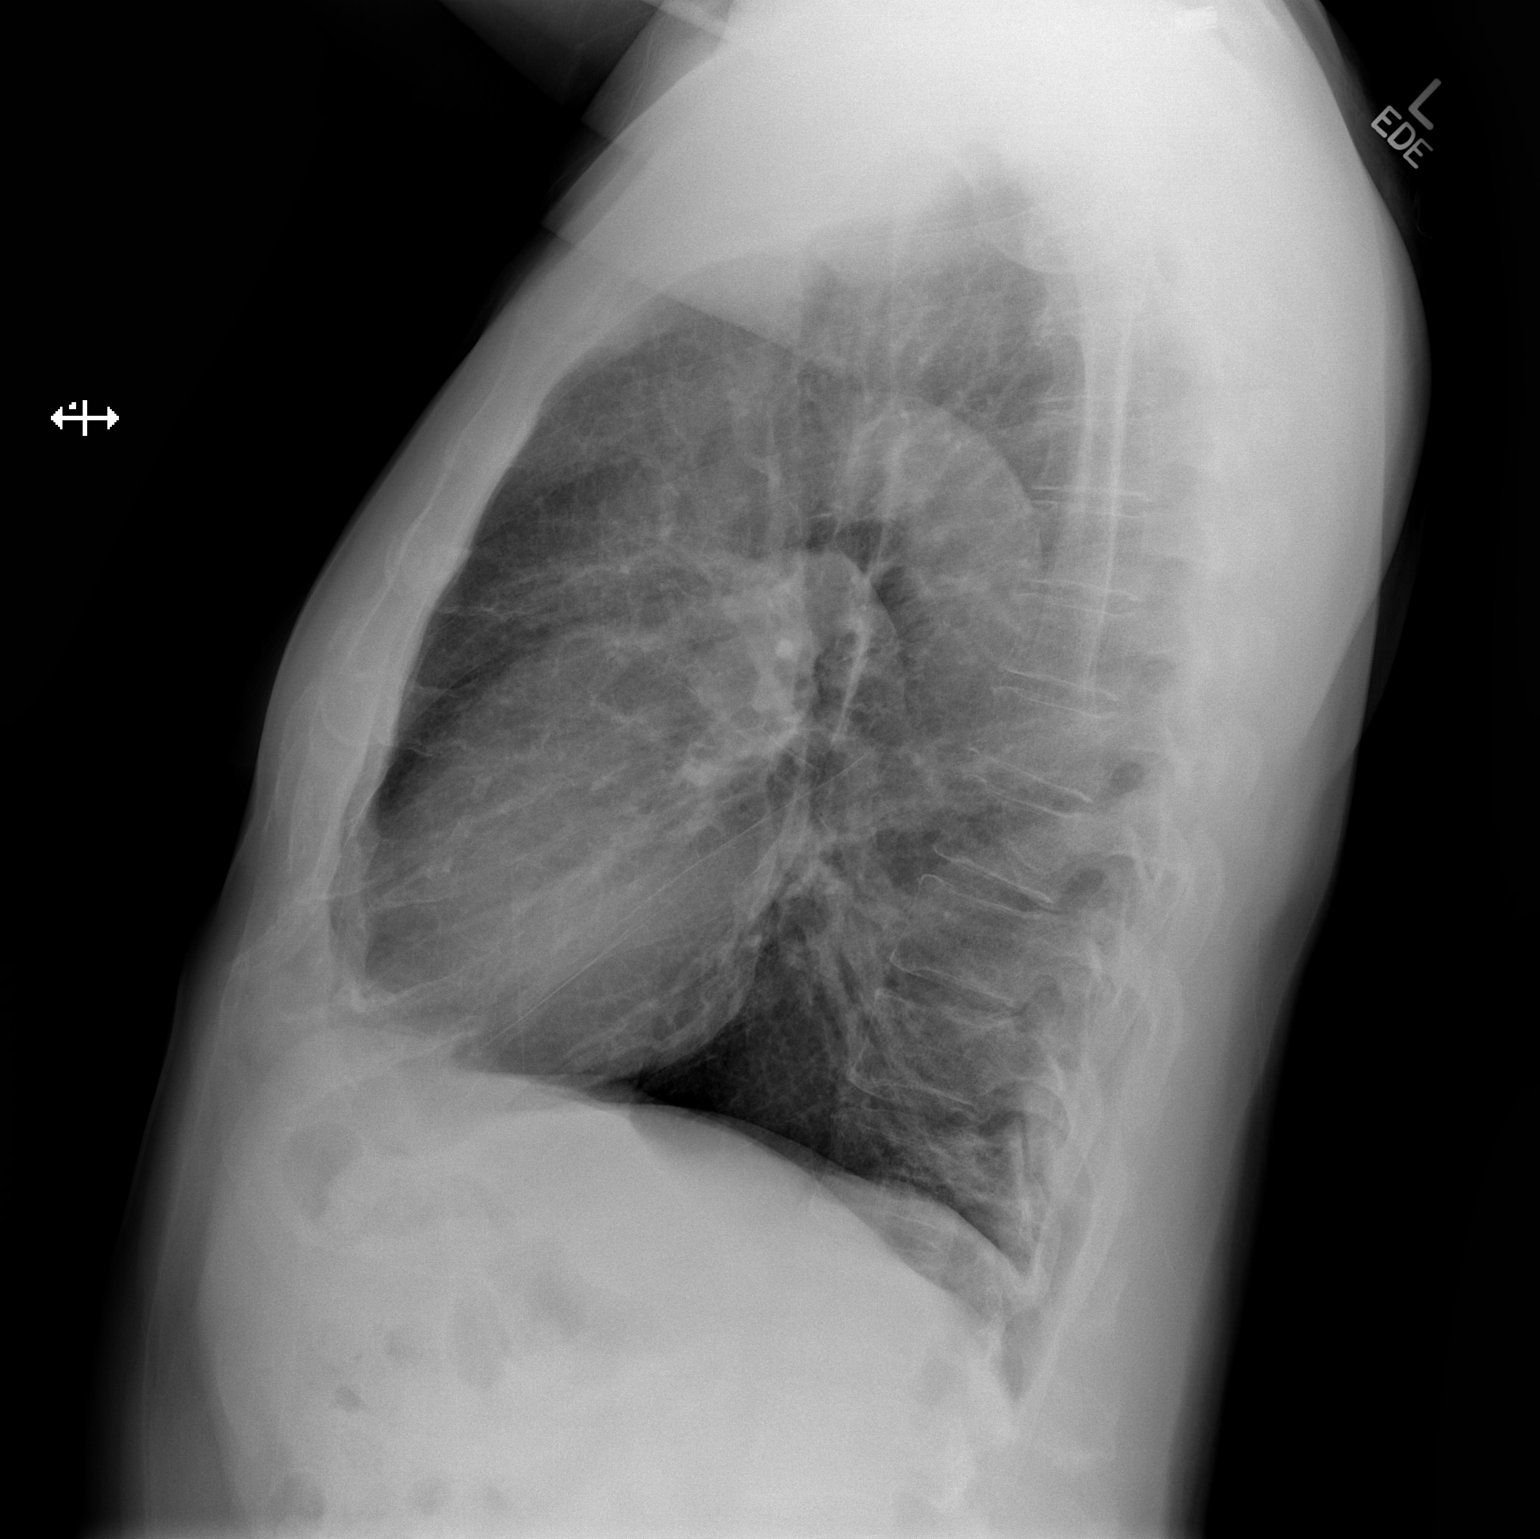

[2 of 2 positions shown; findings below may reference images not displayed]

FINDINGS: The lungs are emphysematous but clear. Small left upper lobe nodule
seen on the prior examinations is not clearly visualized today.
Heart size is normal. No pneumothorax or pleural effusion.
IMPRESSION: Emphysema without acute disease.

## 2016-03-25 IMAGING — CR DG CHEST 1V PORT
1 series · 1 of 1 positions shown · non-contrast
Comparison: 09/10/2014

CLINICAL DATA: Status post left lobectomy, VATS

EXAM:
PORTABLE CHEST - 1 VIEW

[AP]
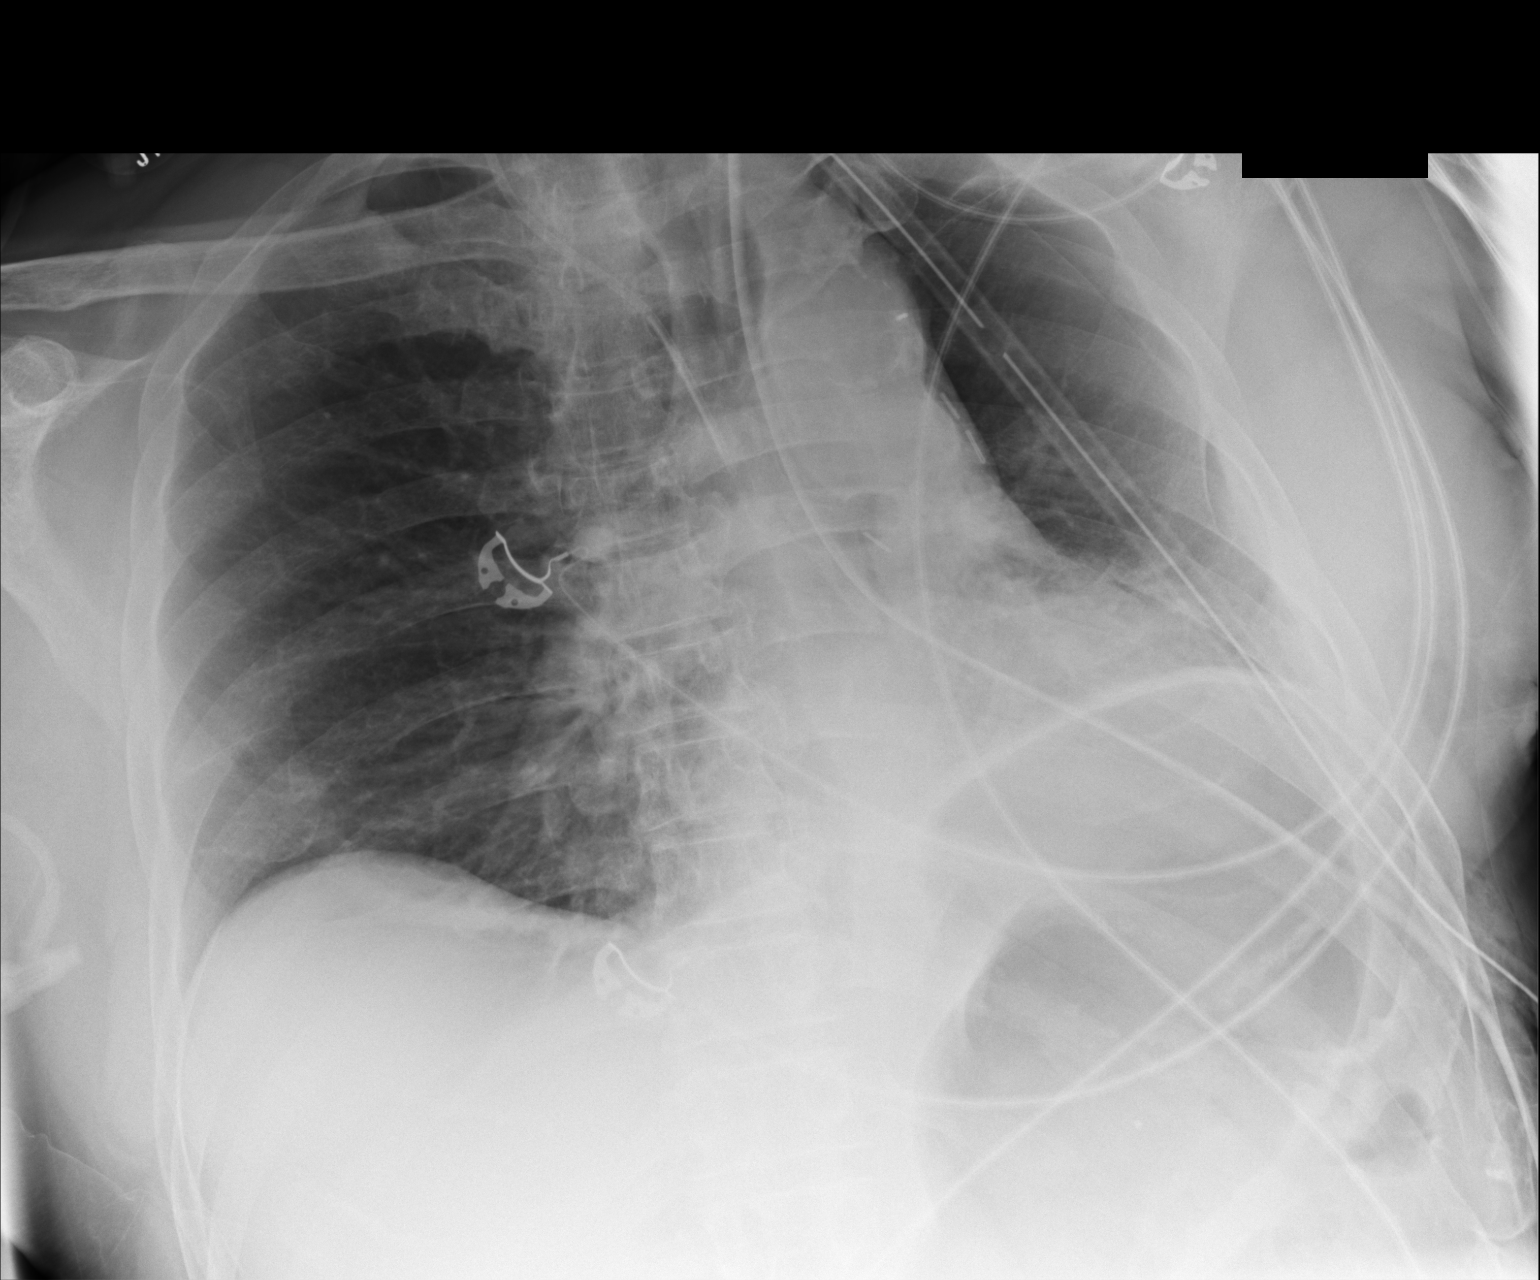

[1 of 1 positions shown; findings below may reference images not displayed]

FINDINGS: Cardiomediastinal silhouette is stable. The patient is status post
left upper lobectomy. Left chest tubes are unchanged in position.
There is left basilar atelectasis or infiltrate. Surgical clips are
noted left hilum. Small left lateral pneumothorax about 10 percent.
Right lung is clear. Stable right IJ central line catheter.
IMPRESSION: The patient is status post left upper lobectomy. Left chest tubes
are unchanged in position. There is left basilar atelectasis or
infiltrate. Surgical clips are noted left hilum. Small left lateral
pneumothorax about 10 percent.

## 2016-05-07 ENCOUNTER — Telehealth: Payer: Self-pay | Admitting: Internal Medicine

## 2016-05-07 NOTE — Telephone Encounter (Signed)
Lab appointment time adjusted per CT appointment. 05/07/16

## 2016-05-12 ENCOUNTER — Ambulatory Visit (HOSPITAL_COMMUNITY)
Admission: RE | Admit: 2016-05-12 | Discharge: 2016-05-12 | Disposition: A | Discharge status: Home / Self Care | Payer: Medicare Other | Source: Ambulatory Visit | Attending: Internal Medicine | Admitting: Internal Medicine

## 2016-05-12 ENCOUNTER — Other Ambulatory Visit (HOSPITAL_BASED_OUTPATIENT_CLINIC_OR_DEPARTMENT_OTHER): Payer: Medicare Other

## 2016-05-12 DIAGNOSIS — C3412 Malignant neoplasm of upper lobe, left bronchus or lung: Secondary | ICD-10-CM

## 2016-05-12 DIAGNOSIS — C61 Malignant neoplasm of prostate: Secondary | ICD-10-CM

## 2016-05-12 DIAGNOSIS — C3492 Malignant neoplasm of unspecified part of left bronchus or lung: Secondary | ICD-10-CM | POA: Diagnosis not present

## 2016-05-12 DIAGNOSIS — J439 Emphysema, unspecified: Secondary | ICD-10-CM | POA: Diagnosis not present

## 2016-05-12 DIAGNOSIS — Z902 Acquired absence of lung [part of]: Secondary | ICD-10-CM | POA: Insufficient documentation

## 2016-05-12 DIAGNOSIS — Z8546 Personal history of malignant neoplasm of prostate: Secondary | ICD-10-CM | POA: Diagnosis not present

## 2016-05-12 DIAGNOSIS — I2510 Atherosclerotic heart disease of native coronary artery without angina pectoris: Secondary | ICD-10-CM | POA: Diagnosis not present

## 2016-05-12 LAB — COMPREHENSIVE METABOLIC PANEL
ALT: 17 U/L (ref 0–55)
AST: 19 U/L (ref 5–34)
Albumin: 4 g/dL (ref 3.5–5.0)
Alkaline Phosphatase: 100 U/L (ref 40–150)
Anion Gap: 11 mEq/L (ref 3–11)
BUN: 12.1 mg/dL (ref 7.0–26.0)
CALCIUM: 9.6 mg/dL (ref 8.4–10.4)
CHLORIDE: 103 meq/L (ref 98–109)
CO2: 24 meq/L (ref 22–29)
Creatinine: 0.8 mg/dL (ref 0.7–1.3)
EGFR: >90 mL/min/{1.73_m2} (ref >90)
Glucose: 94 mg/dl (ref 70–140)
POTASSIUM: 3.8 meq/L (ref 3.5–5.1)
Sodium: 139 mEq/L (ref 136–145)
Total Bilirubin: 0.45 mg/dL (ref 0.20–1.20)
Total Protein: 7.9 g/dL (ref 6.4–8.3)

## 2016-05-12 LAB — CBC WITH DIFFERENTIAL/PLATELET
BASO%: 0.4 % (ref 0.0–2.0)
BASOS ABS: 0 10*3/uL (ref 0.0–0.1)
EOS%: 2.5 % (ref 0.0–7.0)
Eosinophils Absolute: 0.3 10*3/uL (ref 0.0–0.5)
HEMATOCRIT: 37.6 % — AB (ref 38.4–49.9)
HGB: 13.3 g/dL (ref 13.0–17.1)
LYMPH#: 3 10*3/uL (ref 0.9–3.3)
LYMPH%: 29.2 % (ref 14.0–49.0)
MCH: 27.7 pg (ref 27.2–33.4)
MCHC: 35.4 g/dL (ref 32.0–36.0)
MCV: 78.2 fL — ABNORMAL LOW (ref 79.3–98.0)
MONO#: 1.1 10*3/uL — ABNORMAL HIGH (ref 0.1–0.9)
MONO%: 10.9 % (ref 0.0–14.0)
NEUT#: 5.8 10*3/uL (ref 1.5–6.5)
NEUT%: 57 % (ref 39.0–75.0)
Platelets: 213 10*3/uL (ref 140–400)
RBC: 4.81 10*6/uL (ref 4.20–5.82)
RDW: 15.7 % — ABNORMAL HIGH (ref 11.0–14.6)
WBC: 10.3 10*3/uL (ref 4.0–10.3)

## 2016-05-12 MED ORDER — IOPAMIDOL (ISOVUE-300) INJECTION 61%
INTRAVENOUS | Status: AC
  Filled 2016-05-12: qty 75

## 2016-05-12 MED ORDER — IOPAMIDOL (ISOVUE-300) INJECTION 61%
75.0000 mL | Freq: Once | INTRAVENOUS | Status: AC | PRN
  Administered 2016-05-12: 75 mL via INTRAVENOUS

## 2016-05-12 MED ORDER — SODIUM CHLORIDE 0.9 % IJ SOLN
INTRAMUSCULAR | Status: AC
  Filled 2016-05-12: qty 50

## 2016-05-13 LAB — PSA: PROSTATE SPECIFIC AG, SERUM: 0.6 ng/mL (ref 0.0–4.0)

## 2016-05-19 ENCOUNTER — Encounter: Payer: Self-pay | Admitting: Internal Medicine

## 2016-05-19 ENCOUNTER — Ambulatory Visit (HOSPITAL_BASED_OUTPATIENT_CLINIC_OR_DEPARTMENT_OTHER): Payer: Medicare Other | Admitting: Internal Medicine

## 2016-05-19 ENCOUNTER — Telehealth: Payer: Self-pay | Admitting: Internal Medicine

## 2016-05-19 VITALS — BP 139/69 | HR 78 | Temp 97.8°F | Resp 18 | Ht 72.0 in | Wt 198.2 lb

## 2016-05-19 DIAGNOSIS — C61 Malignant neoplasm of prostate: Secondary | ICD-10-CM

## 2016-05-19 DIAGNOSIS — C3412 Malignant neoplasm of upper lobe, left bronchus or lung: Secondary | ICD-10-CM

## 2016-05-19 DIAGNOSIS — J449 Chronic obstructive pulmonary disease, unspecified: Secondary | ICD-10-CM

## 2016-05-19 DIAGNOSIS — C3492 Malignant neoplasm of unspecified part of left bronchus or lung: Secondary | ICD-10-CM

## 2016-05-19 NOTE — Progress Notes (Signed)
[place] [phone]   [phone]  OFFICE PROGRESS NOTE  [name], NP [facility] [place] [address]  DIAGNOSIS:  1) Stage IIA (T1a, N1, M0) non-small cell lung cancer, squamous cell carcinoma diagnosed in November 2015, presented with left suprahilar soft tissue mass. 2) history of prostate adenocarcinoma  PRIOR THERAPY:  1) Neoadjuvant systemic chemotherapy with carboplatin for AUC of 5 and paclitaxel 175 MG/M2 every 3 weeks with Neulasta support. First dose 06/13/2014. Status post 3 cycle. 2) Status post Left video-assisted thoracoscopy, Thoracoscopic left upper lobectomy, Mediastinal lymph node dissection under the care of Dr. Roxan Hockey on 09/09/2014.  CURRENT THERAPY: Observation.  INTERVAL HISTORY: Larry [name] 65 y.o. male six-month follow-up visit. The patient is feeling fine today was no specific complaints except for weight loss. He lost around 15 pounds since his last visit. He eats well and no lack of appetite. He denied having any chest pain, shortness of breath, cough or hemoptysis. He has no nausea, vomiting, diarrhea or constipation. He had repeat CT scan of the chest performed recently and he is here for evaluation and discussion of his scan results.  MEDICAL HISTORY: Past Medical History:  Diagnosis Date  . Chronic cough   . COPD (chronic obstructive pulmonary disease) ([place])   . Dyspnea on exertion   . Full dentures   . Hyperlipidemia   . Non-small cell carcinoma of left lung [facility]) oncologist-- dr Julien Nordmann   dx Nov 2015left upper lobe, 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, N1, M0)  s/p  left upper lobectomy with node dissection and neoadjuvant systemic chemotherapy  . Productive cough    intermittantly  . Prostate cancer [facility]) urologist-- dr wrenn/  oncologsit-  dr Tammi Klippel   Stage T1c , Gleason 4+3, PSA 4.1, vol 26.73cc--  External RXT complete and Radiactive seed  implants on 04-03-2015  . Stage II squamous cell carcinoma of left lung ([place]) 05/22/2014        ALLERGIES:  has No Known Allergies.  MEDICATIONS:  Current Outpatient Prescriptions  Medication Sig Dispense Refill  . pravastatin (PRAVACHOL) 80 MG tablet Take 80 mg by mouth every morning.   1  . SYMBICORT 160-4.5 MCG/ACT inhaler INHALE 2 PUFFS INTO THE LUNGS 2 TIMES DAILY 10.2 Inhaler 11   No current facility-administered medications for this visit.     SURGICAL HISTORY:  Past Surgical History:  Procedure Laterality Date  . COLONOSCOPY  12/ 2009  . ENDOBRONCHIAL ULTRASOUND Bilateral 04/08/2014   Procedure: ENDOBRONCHIAL ULTRASOUND;  Surgeon: Kathee Delton, MD;  Location: [facility];  Service: Cardiopulmonary;  Laterality: Bilateral;  . RADIOACTIVE SEED IMPLANT N/A 04/03/2015   Procedure: RADIOACTIVE SEED IMPLANT/BRACHYTHERAPY IMPLANT;  Surgeon: Irine Seal, MD;  Location: [facility];  Service: Urology;  Laterality: N/A;  . VIDEO ASSISTED THORACOSCOPY (VATS)/ LOBECTOMY Left 09/09/2014   Procedure: LEFT VIDEO ASSISTED THORACOSCOPY (VATS) with LEFT UPPER LOBECTOMY;  Surgeon: Melrose Nakayama, MD;  Location: [place];  Service: Thoracic;  Laterality: Left;  Marland Kitchen VIDEO BRONCHOSCOPY WITH ENDOBRONCHIAL NAVIGATION Right 04/24/2014   Procedure: VIDEO BRONCHOSCOPY WITH ENDOBRONCHIAL NAVIGATION;  Surgeon: Collene Gobble, MD;  Location: [place];  Service: Thoracic;  Laterality: Right;  Marland Kitchen VIDEO BRONCHOSCOPY WITH ENDOBRONCHIAL ULTRASOUND Right 04/24/2014   Procedure: VIDEO BRONCHOSCOPY WITH ENDOBRONCHIAL ULTRASOUND;  Surgeon: Collene Gobble, MD;  Location: [place];  Service: Thoracic;  Laterality: Right;    REVIEW OF SYSTEMS:  A comprehensive  review of systems was negative except for: Constitutional: positive for weight loss   PHYSICAL EXAMINATION: General appearance: alert, cooperative and no distress Head: Normocephalic, without obvious abnormality, atraumatic Neck: no adenopathy, no  JVD, supple, symmetrical, trachea midline and thyroid not enlarged, symmetric, no tenderness/mass/nodules Lymph nodes: Cervical, supraclavicular, and axillary nodes normal. Resp: clear to auscultation bilaterally Back: symmetric, no curvature. ROM normal. No CVA tenderness. Cardio: regular rate and rhythm, S1, S2 normal, no murmur, click, rub or gallop GI: soft, non-tender; bowel sounds normal; no masses,  no organomegaly Extremities: extremities normal, atraumatic, no cyanosis or edema  ECOG PERFORMANCE STATUS: 0 - Asymptomatic  Blood pressure 139/69, pulse 78, temperature 97.8 F (36.6 C), temperature source Oral, resp. rate 18, height 6' (1.829 m), weight 198 lb 3.2 oz (89.9 kg), SpO2 97 %.  LABORATORY DATA: Lab Results  Component Value Date   WBC 10.3 05/12/2016   HGB 13.3 05/12/2016   HCT 37.6 (L) 05/12/2016   MCV 78.2 (L) 05/12/2016   PLT 213 05/12/2016      Chemistry      Component Value Date/Time   NA 139 05/12/2016 1435   K 3.8 05/12/2016 1435   CL 104 05/23/2015 0950   CO2 24 05/12/2016 1435   BUN 12.1 05/12/2016 1435   CREATININE 0.8 05/12/2016 1435      Component Value Date/Time   CALCIUM 9.6 05/12/2016 1435   ALKPHOS 100 05/12/2016 1435   AST 19 05/12/2016 1435   ALT 17 05/12/2016 1435   BILITOT 0.45 05/12/2016 1435       RADIOGRAPHIC STUDIES: Ct Chest W Contrast  Result Date: 05/12/2016 CLINICAL DATA:  Restaging lung cancer. History of squamous cell lung cancer and previous left upper lobe lobectomy. History of prostate cancer. EXAM: CT CHEST WITH CONTRAST TECHNIQUE: Multidetector CT imaging of the chest was performed during intravenous contrast administration. CONTRAST:  49m ISOVUE-300 IOPAMIDOL (ISOVUE-300) INJECTION 61% COMPARISON:  11/21/2015 FINDINGS: Chest wall: No chest wall mass, supraclavicular or axillary lymphadenopathy. The thyroid gland appears normal. Cardiovascular: The heart is normal in size. No pericardial effusion. The aorta is normal  in caliber. Stable atherosclerotic calcifications. No dissection. The branch vessels are patent. Stable three-vessel coronary artery calcifications. Mediastinum/Nodes: No mediastinal or hilar mass or lymphadenopathy. Small scattered mediastinal lymph nodes are unchanged. The esophagus is grossly normal. Lungs/Pleura: Stable emphysematous changes and pulmonary scarring. Stable surgical changes from a left upper lobe lobectomy. Stable nodularity along the left major fissure anteriorly which is likely scar. Mild nodularity is noted along a small cystic lungs space in the right middle lobe on image number 103. This measures approximately 4 mm and appears slightly more rounded than it did on the prior study. Recommend attention to this area on follow-up examinations. No pleural effusion. Upper Abdomen: No significant upper abdominal findings. No evidence of adrenal or hepatic metastatic disease. Musculoskeletal: No significant bony findings. IMPRESSION: 1. Small right middle lobe nodule adjacent to a cystic airspace best seen on axial image 103 and sagittal image 47. Very slight interval enlargement since the prior 2 CT scans. Recommend attention on future scans. 2. Stable surgical changes from a left upper lobe lobectomy. 3. Stable advanced emphysematous changes. 4. No mediastinal or hilar mass or adenopathy and no findings for upper abdominal metastatic disease. Electronically Signed   By: PMarijo SanesM.D.   On: 05/12/2016 17:02    ASSESSMENT AND PLAN: This is a very pleasant 65years old white male with stage IIa non-small cell lung cancer status post  neoadjuvant systemic chemotherapy was carboplatin and paclitaxel for 3 cycles followed by left upper lobectomy with lymph node dissection.  The patient is currently on observation. The recent CT scan of the chest showed no evidence for disease recurrence. I discussed the scan results with the patient today. I recommended for him to continue on observation with  repeat CT scan of the chest in 6 months. For the prostate cancer, he is followed by urology and his PSA performed recently showed no evidence for progression. He was advised to call immediately if he has any concerning symptoms in the interval. The patient voices understanding of current disease status and treatment options and is in agreement with the current care plan.  All questions were answered. The patient knows to call the clinic with any problems, questions or concerns. We can certainly see the patient much sooner if necessary. I spent 10 minutes counseling the patient face to face. The total time spent in the appointment was 15 minutes. Disclaimer: This note was dictated with voice recognition software. Similar sounding words can inadvertently be transcribed and may not be corrected upon review.

## 2016-05-19 NOTE — Patient Instructions (Signed)
Steps to Quit Smoking Smoking tobacco can be bad for your health. It can also affect almost every organ in your body. Smoking puts you and people around you at risk for many serious long-lasting (chronic) diseases. Quitting smoking is hard, but it is one of the best things that you can do for your health. It is never too late to quit. What are the benefits of quitting smoking? When you quit smoking, you lower your risk for getting serious diseases and conditions. They can include:  Lung cancer or lung disease.  Heart disease.  Stroke.  Heart attack.  Not being able to have children (infertility).  Weak bones (osteoporosis) and broken bones (fractures). If you have coughing, wheezing, and shortness of breath, those symptoms may get better when you quit. You may also get sick less often. If you are pregnant, quitting smoking can help to lower your chances of having a baby of low birth weight. What can I do to help me quit smoking? Talk with your doctor about what can help you quit smoking. Some things you can do (strategies) include:  Quitting smoking totally, instead of slowly cutting back how much you smoke over a period of time.  Going to in-person counseling. You are more likely to quit if you go to many counseling sessions.  Using resources and support systems, such as:  Online chats with a counselor.  Phone quitlines.  Printed self-help materials.  Support groups or group counseling.  Text messaging programs.  Mobile phone apps or applications.  Taking medicines. Some of these medicines may have nicotine in them. If you are pregnant or breastfeeding, do not take any medicines to quit smoking unless your doctor says it is okay. Talk with your doctor about counseling or other things that can help you. Talk with your doctor about using more than one strategy at the same time, such as taking medicines while you are also going to in-person counseling. This can help make quitting  easier. What things can I do to make it easier to quit? Quitting smoking might feel very hard at first, but there is a lot that you can do to make it easier. Take these steps:  Talk to your family and friends. Ask them to support and encourage you.  Call phone quitlines, reach out to support groups, or work with a counselor.  Ask people who smoke to not smoke around you.  Avoid places that make you want (trigger) to smoke, such as:  Bars.  Parties.  Smoke-break areas at work.  Spend time with people who do not smoke.  Lower the stress in your life. Stress can make you want to smoke. Try these things to help your stress:  Getting regular exercise.  Deep-breathing exercises.  Yoga.  Meditating.  Doing a body scan. To do this, close your eyes, focus on one area of your body at a time from head to toe, and notice which parts of your body are tense. Try to relax the muscles in those areas.  Download or buy apps on your mobile phone or tablet that can help you stick to your quit plan. There are many free apps, such as QuitGuide from the CDC (Centers for Disease Control and Prevention). You can find more support from smokefree.gov and other websites. This information is not intended to replace advice given to you by your health care provider. Make sure you discuss any questions you have with your health care provider. Document Released: 03/13/2009 Document Revised: 01/13/2016 Document   Reviewed: 10/01/2014 Elsevier Interactive Patient Education  2017 Elsevier Inc.  

## 2016-05-19 NOTE — Telephone Encounter (Signed)
Gave patient avs report and appointments for June. [facility] will call re scan.  °

## 2016-05-28 ENCOUNTER — Other Ambulatory Visit: Payer: Self-pay | Admitting: Nurse Practitioner

## 2016-10-13 IMAGING — CR DG CHEST 2V
2 series · 2 of 2 positions shown · non-contrast
Comparison: PA and lateral chest x-ray dated December 24, 2014

CLINICAL DATA: Follow-up the 8 CS in August 2014 for lung
malignancy, no current chest complaints, history of COPD

EXAM:
CHEST  2 VIEW

[w chest pa]
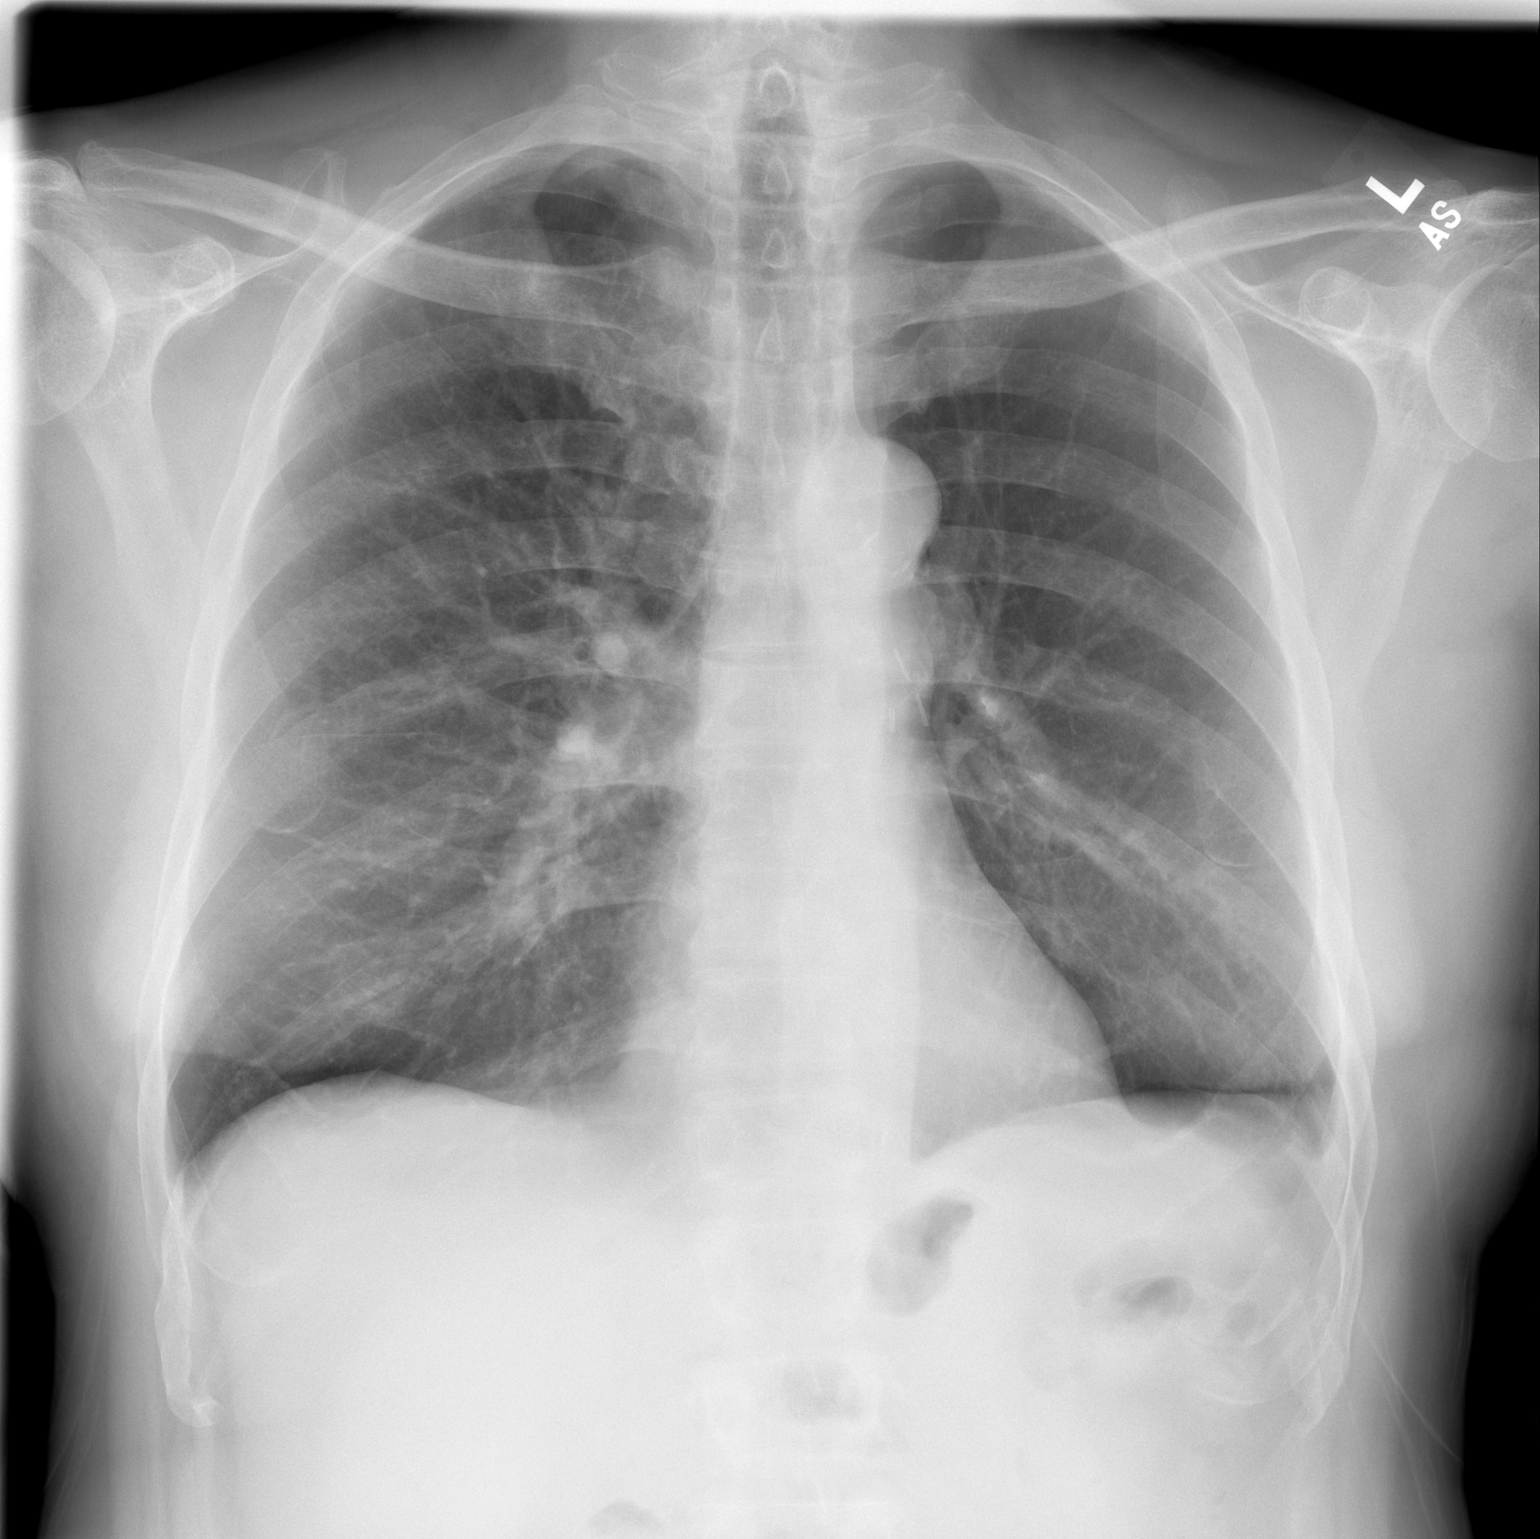

[w chest lat]
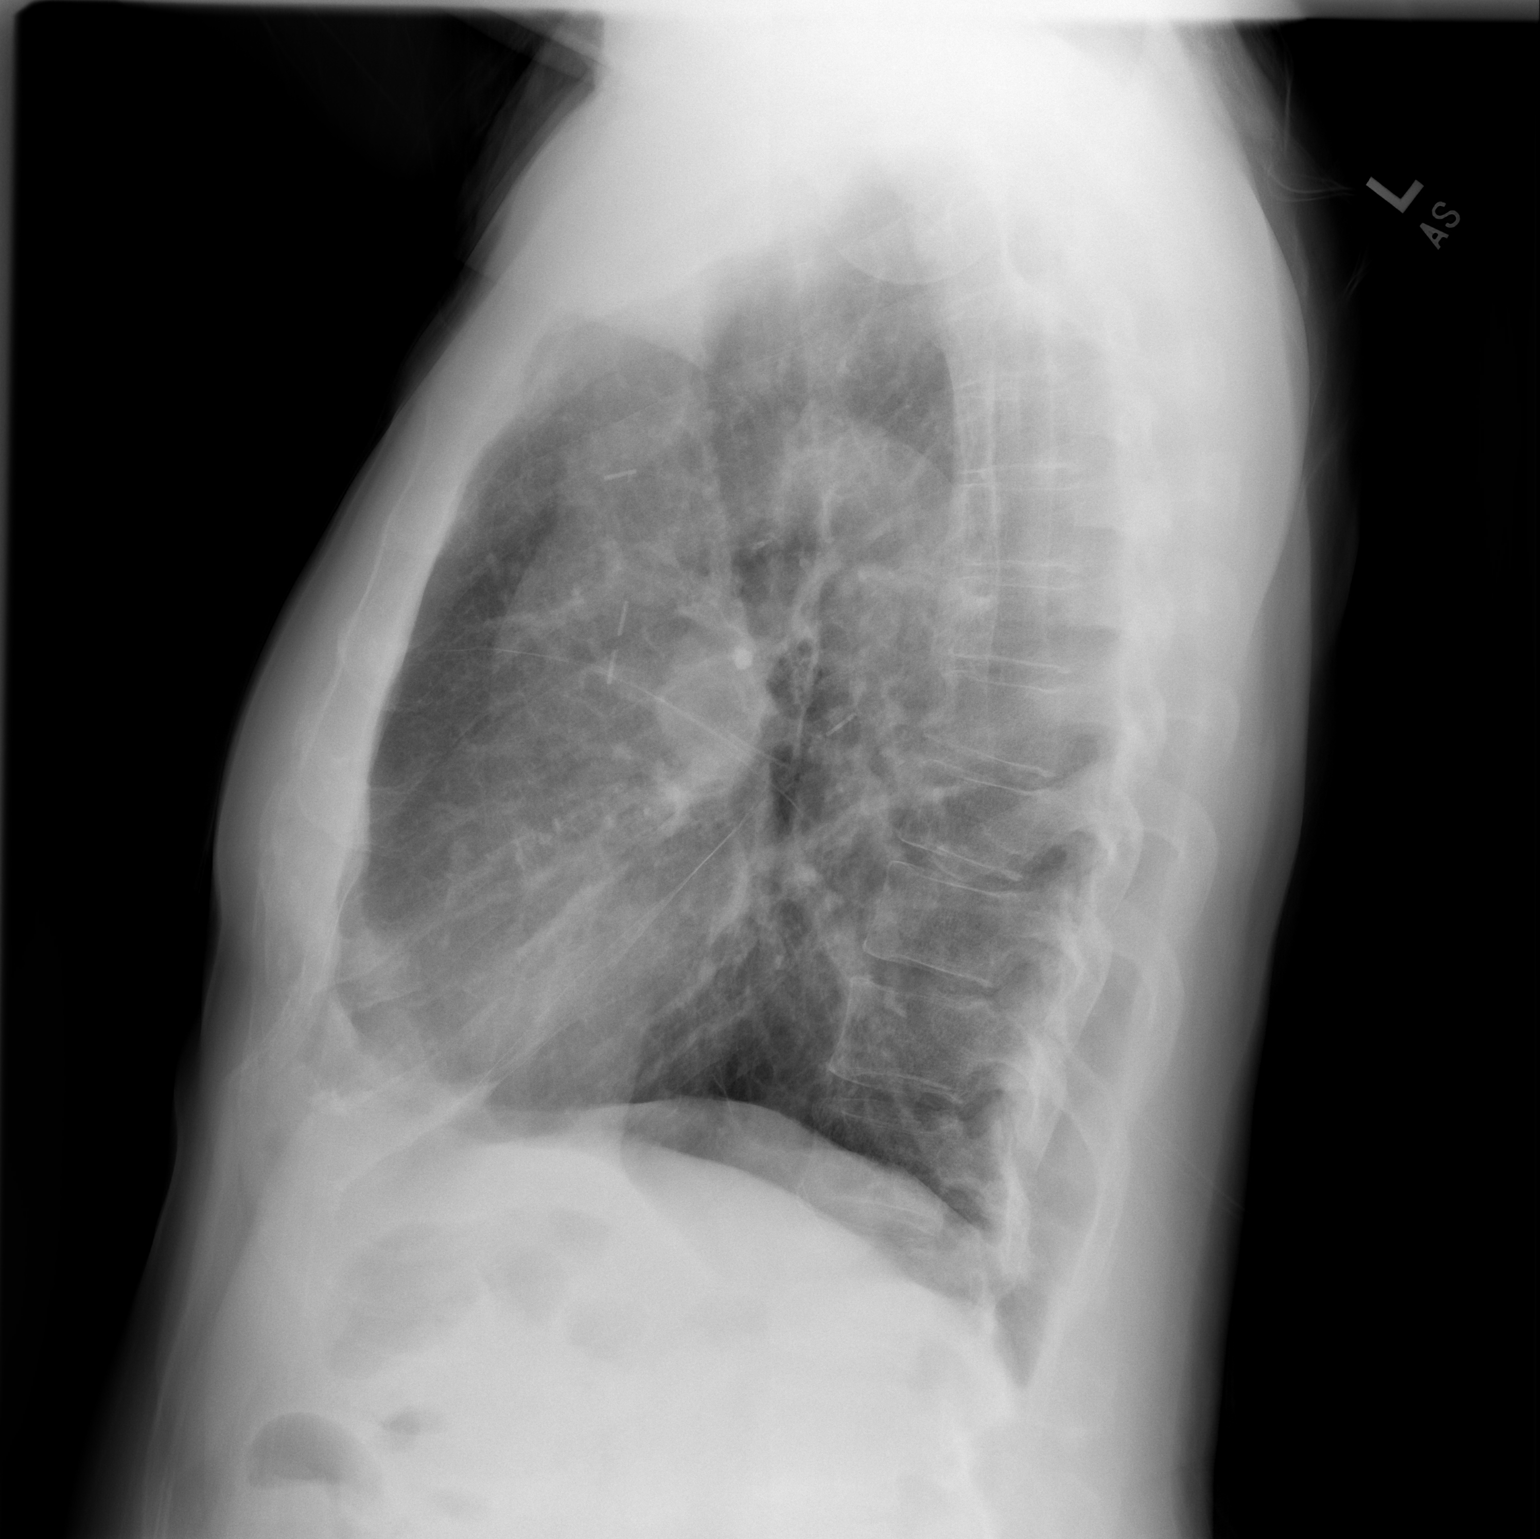

[2 of 2 positions shown; findings below may reference images not displayed]

FINDINGS: The lungs are well-expanded. Mild stable volume loss on the left is
noted. The interstitial markings on the right remain mildly
prominent. There is no alveolar infiltrate, significant pleural
effusion, or pneumothorax. There surgical clips in the left hilar
region. The heart and pulmonary vascularity are normal. The bony
thorax exhibits no acute abnormality.
IMPRESSION: COPD. Status post left upper lobectomy. There is no active
cardiopulmonary disease.

## 2016-11-17 ENCOUNTER — Ambulatory Visit (HOSPITAL_COMMUNITY)
Admission: RE | Admit: 2016-11-17 | Discharge: 2016-11-17 | Disposition: A | Discharge status: Home / Self Care | Payer: Medicare Other | Source: Ambulatory Visit | Attending: Internal Medicine | Admitting: Internal Medicine

## 2016-11-17 ENCOUNTER — Encounter (HOSPITAL_COMMUNITY): Payer: Self-pay

## 2016-11-17 ENCOUNTER — Other Ambulatory Visit (HOSPITAL_BASED_OUTPATIENT_CLINIC_OR_DEPARTMENT_OTHER): Payer: Medicare Other

## 2016-11-17 DIAGNOSIS — C3492 Malignant neoplasm of unspecified part of left bronchus or lung: Secondary | ICD-10-CM | POA: Insufficient documentation

## 2016-11-17 DIAGNOSIS — J439 Emphysema, unspecified: Secondary | ICD-10-CM | POA: Insufficient documentation

## 2016-11-17 DIAGNOSIS — C61 Malignant neoplasm of prostate: Secondary | ICD-10-CM | POA: Insufficient documentation

## 2016-11-17 DIAGNOSIS — I700 Atherosclerosis of aorta: Secondary | ICD-10-CM | POA: Insufficient documentation

## 2016-11-17 DIAGNOSIS — C3412 Malignant neoplasm of upper lobe, left bronchus or lung: Secondary | ICD-10-CM | POA: Diagnosis not present

## 2016-11-17 LAB — COMPREHENSIVE METABOLIC PANEL
ALK PHOS: 92 U/L (ref 40–150)
ALT: 26 U/L (ref 0–55)
AST: 24 U/L (ref 5–34)
Albumin: 3.7 g/dL (ref 3.5–5.0)
Anion Gap: 9 mEq/L (ref 3–11)
BUN: 9.2 mg/dL (ref 7.0–26.0)
CHLORIDE: 105 meq/L (ref 98–109)
CO2: 25 meq/L (ref 22–29)
Calcium: 9.3 mg/dL (ref 8.4–10.4)
Creatinine: 0.8 mg/dL (ref 0.7–1.3)
GLUCOSE: 108 mg/dL (ref 70–140)
POTASSIUM: 4 meq/L (ref 3.5–5.1)
SODIUM: 139 meq/L (ref 136–145)
Total Bilirubin: 0.46 mg/dL (ref 0.20–1.20)
Total Protein: 7.1 g/dL (ref 6.4–8.3)

## 2016-11-17 LAB — CBC WITH DIFFERENTIAL/PLATELET
BASO%: 0.6 % (ref 0.0–2.0)
BASOS ABS: 0.1 10*3/uL (ref 0.0–0.1)
EOS ABS: 0.1 10*3/uL (ref 0.0–0.5)
EOS%: 1.4 % (ref 0.0–7.0)
HCT: 37.8 % — ABNORMAL LOW (ref 38.4–49.9)
HGB: 12.8 g/dL — ABNORMAL LOW (ref 13.0–17.1)
LYMPH%: 20.3 % (ref 14.0–49.0)
MCH: 28.2 pg (ref 27.2–33.4)
MCHC: 33.9 g/dL (ref 32.0–36.0)
MCV: 83.4 fL (ref 79.3–98.0)
MONO#: 0.9 10*3/uL (ref 0.1–0.9)
MONO%: 9.8 % (ref 0.0–14.0)
NEUT#: 6 10*3/uL (ref 1.5–6.5)
NEUT%: 67.9 % (ref 39.0–75.0)
Platelets: 160 10*3/uL (ref 140–400)
RBC: 4.53 10*6/uL (ref 4.20–5.82)
RDW: 16.7 % — ABNORMAL HIGH (ref 11.0–14.6)
WBC: 8.8 10*3/uL (ref 4.0–10.3)
lymph#: 1.8 10*3/uL (ref 0.9–3.3)

## 2016-11-17 MED ORDER — IOPAMIDOL (ISOVUE-300) INJECTION 61%
INTRAVENOUS | Status: AC
  Filled 2016-11-17: qty 75

## 2016-11-17 MED ORDER — IOPAMIDOL (ISOVUE-300) INJECTION 61%
75.0000 mL | Freq: Once | INTRAVENOUS | Status: AC | PRN
Start: 2016-11-17 — End: 2016-11-17
  Administered 2016-11-17: 75 mL via INTRAVENOUS

## 2016-11-24 ENCOUNTER — Telehealth: Payer: Self-pay | Admitting: *Deleted

## 2016-11-24 ENCOUNTER — Ambulatory Visit (HOSPITAL_BASED_OUTPATIENT_CLINIC_OR_DEPARTMENT_OTHER): Payer: Medicare Other | Admitting: Internal Medicine

## 2016-11-24 ENCOUNTER — Encounter: Payer: Self-pay | Admitting: Internal Medicine

## 2016-11-24 ENCOUNTER — Telehealth: Payer: Self-pay | Admitting: Internal Medicine

## 2016-11-24 VITALS — BP 137/84 | HR 63 | Temp 98.7°F | Resp 18 | Ht 72.0 in | Wt 188.5 lb

## 2016-11-24 DIAGNOSIS — Z720 Tobacco use: Secondary | ICD-10-CM | POA: Diagnosis not present

## 2016-11-24 DIAGNOSIS — J449 Chronic obstructive pulmonary disease, unspecified: Secondary | ICD-10-CM | POA: Diagnosis not present

## 2016-11-24 DIAGNOSIS — C3412 Malignant neoplasm of upper lobe, left bronchus or lung: Secondary | ICD-10-CM

## 2016-11-24 DIAGNOSIS — C3492 Malignant neoplasm of unspecified part of left bronchus or lung: Secondary | ICD-10-CM

## 2016-11-24 NOTE — Telephone Encounter (Signed)
Oncology Nurse Navigator Documentation  Oncology Nurse Navigator Flowsheets 11/24/2016  Navigator Location [facility]  Navigator Encounter Type Clinic/MDC/spoke with patient today at [facility].  He is here for follow up.  He is still smoking and I educated on the benefits of smoking cessation.  I gave him information and asked that he contact me or outreach for help if needed.  He asked for help to fill out quitline [place] form for smoking cessation help.  I help complete and will fax.    Patient Visit Type MedOnc  Treatment Phase Follow-up  Barriers/Navigation Needs Education  Education Smoking cessation  Interventions Education  Education Method Verbal;Written  Acuity Level 2  Acuity Level 2 Educational needs  Time Spent with Patient 30

## 2016-11-24 NOTE — Telephone Encounter (Signed)
Scheduled appt per 6/27 los - Gave patient AVS and calender per los . [facility] to contact patient with ct schedule.

## 2016-11-24 NOTE — Progress Notes (Signed)
[place] [phone]) [phone]   [phone]) [phone]  OFFICE PROGRESS NOTE  [name], NP [facility] [place] [address]  DIAGNOSIS:  1) Stage IIA (T1a, N1, M0) non-small cell lung cancer, squamous cell carcinoma diagnosed in November 2015, presented with left suprahilar soft tissue mass. 2) history of prostate adenocarcinoma  PRIOR THERAPY:  1) Neoadjuvant systemic chemotherapy with carboplatin for AUC of 5 and paclitaxel 175 MG/M2 every 3 weeks with Neulasta support. First dose 06/13/2014. Status post 3 cycle. 2) Status post Left video-assisted thoracoscopy, Thoracoscopic left upper lobectomy, Mediastinal lymph node dissection under the care of Dr. Roxan Hockey on 09/09/2014.  CURRENT THERAPY: Observation.  INTERVAL HISTORY: Larry [name] 66 y.o. male returns to the clinic today for six-month follow-up visit. The patient is feeling fine with no specific complaints. Unfortunately he continues to smoke. He denied having any chest pain, shortness breath, cough or hemoptysis. He denied having any fever or chills. He has no nausea, vomiting, diarrhea or constipation. He denied having any weight loss or night sweats. He had repeat CT scan of the chest performed recently and he is here today for evaluation and discussion of his scan results.  MEDICAL HISTORY: Past Medical History:  Diagnosis Date  . Chronic cough   . COPD (chronic obstructive pulmonary disease) ([place])   . Dyspnea on exertion   . Full dentures   . Hyperlipidemia   . Non-small cell carcinoma of left lung [facility]) oncologist-- dr Julien Nordmann   dx Nov 2015left upper lobe, 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, N1, M0)  s/p  left upper lobectomy with node dissection and neoadjuvant systemic chemotherapy  . Productive cough    intermittantly  . Prostate cancer [facility]) urologist-- dr wrenn/  oncologsit-  dr Tammi Klippel   Stage T1c , Gleason 4+3, PSA 4.1, vol  26.73cc--  External RXT complete and Radiactive seed implants on 04-03-2015  . Stage II squamous cell carcinoma of left lung ([place]) 05/22/2014        ALLERGIES:  has No Known Allergies.  MEDICATIONS:  Current Outpatient Prescriptions  Medication Sig Dispense Refill  . pravastatin (PRAVACHOL) 80 MG tablet Take 80 mg by mouth every morning.   1  . SYMBICORT 160-4.5 MCG/ACT inhaler INHALE 2 PUFFS INTO THE LUNGS 2 TIMES DAILY 10.2 Inhaler 11  . tamsulosin (FLOMAX) 0.4 MG CAPS capsule Take 0.4 mg by mouth daily after supper.     No current facility-administered medications for this visit.     SURGICAL HISTORY:  Past Surgical History:  Procedure Laterality Date  . COLONOSCOPY  12/ 2009  . ENDOBRONCHIAL ULTRASOUND Bilateral 04/08/2014   Procedure: ENDOBRONCHIAL ULTRASOUND;  Surgeon: Kathee Delton, MD;  [facility];  Service: Cardiopulmonary;  Laterality: Bilateral;  . RADIOACTIVE SEED IMPLANT N/A 04/03/2015   Procedure: RADIOACTIVE SEED IMPLANT/BRACHYTHERAPY IMPLANT;  Surgeon: Irine Seal, MD;  Location: [facility];  Service: Urology;  Laterality: N/A;  . VIDEO ASSISTED THORACOSCOPY (VATS)/ LOBECTOMY Left 09/09/2014   Procedure: LEFT VIDEO ASSISTED THORACOSCOPY (VATS) with LEFT UPPER LOBECTOMY;  Surgeon: Melrose Nakayama, MD;  Location: [place];  Service: Thoracic;  Laterality: Left;  Marland Kitchen VIDEO BRONCHOSCOPY WITH ENDOBRONCHIAL NAVIGATION Right 04/24/2014   Procedure: VIDEO BRONCHOSCOPY WITH ENDOBRONCHIAL NAVIGATION;  Surgeon: Collene Gobble, MD;  Location: [place];  Service: Thoracic;  Laterality: Right;  Marland Kitchen VIDEO BRONCHOSCOPY WITH ENDOBRONCHIAL ULTRASOUND Right 04/24/2014   Procedure: VIDEO BRONCHOSCOPY WITH ENDOBRONCHIAL ULTRASOUND;  Surgeon: Herbie Baltimore  Agustina Caroli, MD;  [facility];  Service: Thoracic;  Laterality: Right;    REVIEW OF SYSTEMS:  A comprehensive review of systems was negative.   PHYSICAL EXAMINATION: General appearance: alert, cooperative and no  distress Head: Normocephalic, without obvious abnormality, atraumatic Neck: no adenopathy, no JVD, supple, symmetrical, trachea midline and thyroid not enlarged, symmetric, no tenderness/mass/nodules Lymph nodes: Cervical, supraclavicular, and axillary nodes normal. Resp: clear to auscultation bilaterally Back: symmetric, no curvature. ROM normal. No CVA tenderness. Cardio: regular rate and rhythm, S1, S2 normal, no murmur, click, rub or gallop GI: soft, non-tender; bowel sounds normal; no masses,  no organomegaly Extremities: extremities normal, atraumatic, no cyanosis or edema  ECOG PERFORMANCE STATUS: 0 - Asymptomatic  Blood pressure 137/84, pulse 63, temperature 98.7 F (37.1 C), temperature source Oral, resp. rate 18, height 6' (1.829 m), weight 188 lb 8 oz (85.5 kg), SpO2 100 %.  LABORATORY DATA: Lab Results  Component Value Date   WBC 8.8 11/17/2016   HGB 12.8 (L) 11/17/2016   HCT 37.8 (L) 11/17/2016   MCV 83.4 11/17/2016   PLT 160 11/17/2016      Chemistry      Component Value Date/Time   NA 139 11/17/2016 1018   K 4.0 11/17/2016 1018   CL 104 05/23/2015 0950   CO2 25 11/17/2016 1018   BUN 9.2 11/17/2016 1018   CREATININE 0.8 11/17/2016 1018      Component Value Date/Time   CALCIUM 9.3 11/17/2016 1018   ALKPHOS 92 11/17/2016 1018   AST 24 11/17/2016 1018   ALT 26 11/17/2016 1018   BILITOT 0.46 11/17/2016 1018       RADIOGRAPHIC STUDIES: Ct Chest W Contrast  Result Date: 11/17/2016 CLINICAL DATA:  Restaging lung carcinoma. LEFT upper lobectomy 2015. Chemo radiation therapy EXAM: CT CHEST WITH CONTRAST TECHNIQUE: Multidetector CT imaging of the chest was performed during intravenous contrast administration. CONTRAST:  29mL ISOVUE-300 IOPAMIDOL (ISOVUE-300) INJECTION 61% COMPARISON:  05/12/2016 FINDINGS: Cardiovascular: No significant vascular findings. Normal heart size. Coronary artery calcification and aortic atherosclerotic calcification. Mediastinum/Nodes: No  axillary or supraclavicular adenopathy. No mediastinal hilar adenopathy. No pericardial fluid. Esophagus normal. Lungs/Pleura: Within the RIGHT middle lobe 5 mm nodule (image 104, series 5) compares to 5 mm on most recent comparison exam (12/13/ 17). Centrilobular emphysema in the upper lobes. Upper Abdomen: Limited view of the liver, kidneys, pancreas are unremarkable. Normal adrenal glands. Musculoskeletal: No aggressive osseous lesion. IMPRESSION: 1. No evidence lung cancer recurrence. 2. No interval change in RIGHT middle lobe nodule. Recommend continued attention on follow-up. 3. Aortic Atherosclerosis (ICD10-I70.0) and Emphysema (ICD10-J43.9). Electronically Signed   By: Suzy Bouchard M.D.   On: 11/17/2016 14:16    ASSESSMENT AND PLAN:  This is a very pleasant 66 years old African-American male with a stage IIA non-small cell lung cancer, squamous cell carcinoma status post neoadjuvant systemic chemotherapy followed by left upper lobectomy and lymph node dissection in April 2016. The patient has been observation since that time and he is doing fine. He has repeat CT scan of the chest performed recently. The scan showed no evidence for disease recurrence. I recommended for him to continue on observation with repeat CT scan of the chest in 6 months. For smoke cessation, strongly encouraged the patient to quit smoking and he was given handout and information about quitting smoking. He was advised to call immediately if he has any concerning symptoms in the interval. The patient voices understanding of current disease status and treatment options and  is in agreement with the current care plan. All questions were answered. The patient knows to call the clinic with any problems, questions or concerns. We can certainly see the patient much sooner if necessary. I spent 10 minutes counseling the patient face to face. The total time spent in the appointment was 15 minutes. Disclaimer: This note was  dictated with voice recognition software. Similar sounding words can inadvertently be transcribed and may not be corrected upon review.

## 2017-05-25 ENCOUNTER — Encounter (HOSPITAL_COMMUNITY): Payer: Self-pay

## 2017-05-25 ENCOUNTER — Ambulatory Visit (HOSPITAL_COMMUNITY)
Admission: RE | Admit: 2017-05-25 | Discharge: 2017-05-25 | Disposition: A | Discharge status: Home / Self Care | Payer: Medicare Other | Source: Ambulatory Visit | Attending: Internal Medicine | Admitting: Internal Medicine

## 2017-05-25 ENCOUNTER — Other Ambulatory Visit (HOSPITAL_BASED_OUTPATIENT_CLINIC_OR_DEPARTMENT_OTHER): Payer: Medicare Other

## 2017-05-25 DIAGNOSIS — J432 Centrilobular emphysema: Secondary | ICD-10-CM | POA: Insufficient documentation

## 2017-05-25 DIAGNOSIS — C3412 Malignant neoplasm of upper lobe, left bronchus or lung: Secondary | ICD-10-CM

## 2017-05-25 DIAGNOSIS — I700 Atherosclerosis of aorta: Secondary | ICD-10-CM | POA: Insufficient documentation

## 2017-05-25 DIAGNOSIS — C3492 Malignant neoplasm of unspecified part of left bronchus or lung: Secondary | ICD-10-CM | POA: Insufficient documentation

## 2017-05-25 DIAGNOSIS — J449 Chronic obstructive pulmonary disease, unspecified: Secondary | ICD-10-CM

## 2017-05-25 LAB — CBC WITH DIFFERENTIAL/PLATELET
BASO%: 1.2 % (ref 0.0–2.0)
BASOS ABS: 0.1 10*3/uL (ref 0.0–0.1)
EOS ABS: 0.3 10*3/uL (ref 0.0–0.5)
EOS%: 3.4 % (ref 0.0–7.0)
HEMATOCRIT: 35.2 % — AB (ref 38.4–49.9)
HGB: 11.7 g/dL — ABNORMAL LOW (ref 13.0–17.1)
LYMPH#: 1.6 10*3/uL (ref 0.9–3.3)
LYMPH%: 20.2 % (ref 14.0–49.0)
MCH: 28.7 pg (ref 27.2–33.4)
MCHC: 33.3 g/dL (ref 32.0–36.0)
MCV: 86.3 fL (ref 79.3–98.0)
MONO#: 0.8 10*3/uL (ref 0.1–0.9)
MONO%: 9.7 % (ref 0.0–14.0)
NEUT%: 65.5 % (ref 39.0–75.0)
NEUTROS ABS: 5.1 10*3/uL (ref 1.5–6.5)
Platelets: 460 10*3/uL — ABNORMAL HIGH (ref 140–400)
RBC: 4.08 10*6/uL — ABNORMAL LOW (ref 4.20–5.82)
RDW: 17.7 % — AB (ref 11.0–14.6)
WBC: 7.8 10*3/uL (ref 4.0–10.3)

## 2017-05-25 LAB — COMPREHENSIVE METABOLIC PANEL
ALBUMIN: 3.3 g/dL — AB (ref 3.5–5.0)
ALK PHOS: 80 U/L (ref 40–150)
ALT: 17 U/L (ref 0–55)
AST: 18 U/L (ref 5–34)
Anion Gap: 11 mEq/L (ref 3–11)
BILIRUBIN TOTAL: 0.29 mg/dL (ref 0.20–1.20)
BUN: 10.2 mg/dL (ref 7.0–26.0)
CALCIUM: 9.5 mg/dL (ref 8.4–10.4)
CO2: 23 mEq/L (ref 22–29)
Chloride: 104 mEq/L (ref 98–109)
Creatinine: 0.7 mg/dL (ref 0.7–1.3)
GLUCOSE: 101 mg/dL (ref 70–140)
POTASSIUM: 3.7 meq/L (ref 3.5–5.1)
SODIUM: 139 meq/L (ref 136–145)
TOTAL PROTEIN: 7.7 g/dL (ref 6.4–8.3)

## 2017-05-25 MED ORDER — IOPAMIDOL (ISOVUE-300) INJECTION 61%
INTRAVENOUS | Status: AC
  Filled 2017-05-25: qty 75

## 2017-05-25 MED ORDER — IOPAMIDOL (ISOVUE-300) INJECTION 61%
75.0000 mL | Freq: Once | INTRAVENOUS | Status: AC | PRN
Start: 2017-05-25 — End: 2017-05-25
  Administered 2017-05-25: 75 mL via INTRAVENOUS

## 2017-06-01 ENCOUNTER — Ambulatory Visit (HOSPITAL_BASED_OUTPATIENT_CLINIC_OR_DEPARTMENT_OTHER): Payer: Medicare Other | Admitting: Internal Medicine

## 2017-06-01 ENCOUNTER — Telehealth: Payer: Self-pay | Admitting: Internal Medicine

## 2017-06-01 ENCOUNTER — Encounter: Payer: Self-pay | Admitting: Internal Medicine

## 2017-06-01 DIAGNOSIS — C3490 Malignant neoplasm of unspecified part of unspecified bronchus or lung: Secondary | ICD-10-CM

## 2017-06-01 DIAGNOSIS — C3412 Malignant neoplasm of upper lobe, left bronchus or lung: Secondary | ICD-10-CM | POA: Diagnosis not present

## 2017-06-01 NOTE — Progress Notes (Signed)
[place] Telephone:([phone]) [phone]   Fax:(336) [phone]  OFFICE PROGRESS NOTE  [name], NP [facility] [place] [address]  DIAGNOSIS:  1) Stage IIA (T1a, N1, M0) non-small cell lung cancer, squamous cell carcinoma diagnosed in November 2015, presented with left suprahilar soft tissue mass. 2) history of prostate adenocarcinoma  PRIOR THERAPY:  1) Neoadjuvant systemic chemotherapy with carboplatin for AUC of 5 and paclitaxel 175 MG/M2 every 3 weeks with Neulasta support. First dose 06/13/2014. Status post 3 cycle. 2) Status post Left video-assisted thoracoscopy, Thoracoscopic left upper lobectomy, Mediastinal lymph node dissection under the care of Dr. Roxan Hockey on 09/09/2014.  CURRENT THERAPY: Observation.  INTERVAL HISTORY: Larry [name] 67 y.o. male returns to the clinic today for follow-up visit.  The patient is feeling fine today with no specific complaints.  He denied having any chest pain, shortness of breath, cough or hemoptysis.  He denied having any fever or chills.  He has no nausea, vomiting, diarrhea or constipation.  Unfortunately he continues to smoke.  He had repeat CT scan of the chest performed recently and he is here for evaluation and discussion of his discuss results.  MEDICAL HISTORY: Past Medical History:  Diagnosis Date  . Chronic cough   . COPD (chronic obstructive pulmonary disease) ([place])   . Dyspnea on exertion   . Full dentures   . Hyperlipidemia   . Non-small cell carcinoma of left lung [facility]) oncologist-- dr Julien Nordmann   dx Nov 2015left upper lobe, 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, N1, M0)  s/p  left upper lobectomy with node dissection and neoadjuvant systemic chemotherapy  . Productive cough    intermittantly  . Prostate cancer [facility]) urologist-- dr wrenn/  oncologsit-  dr Tammi Klippel   Stage T1c , Gleason 4+3, PSA 4.1, vol 26.73cc--  External RXT complete and Radiactive seed  implants on 04-03-2015  . Stage II squamous cell carcinoma of left lung ([place]) 05/22/2014        ALLERGIES:  has No Known Allergies.  MEDICATIONS:  Current Outpatient Medications  Medication Sig Dispense Refill  . pravastatin (PRAVACHOL) 80 MG tablet Take 80 mg by mouth every morning.   1  . SYMBICORT 160-4.5 MCG/ACT inhaler INHALE 2 PUFFS INTO THE LUNGS 2 TIMES DAILY 10.2 Inhaler 11  . tamsulosin (FLOMAX) 0.4 MG CAPS capsule Take 0.4 mg by mouth daily after supper.     No current facility-administered medications for this visit.     SURGICAL HISTORY:  Past Surgical History:  Procedure Laterality Date  . COLONOSCOPY  12/ 2009  . ENDOBRONCHIAL ULTRASOUND Bilateral 04/08/2014   Procedure: ENDOBRONCHIAL ULTRASOUND;  Surgeon: Kathee Delton, MD;  Location: [facility];  Service: Cardiopulmonary;  Laterality: Bilateral;  . RADIOACTIVE SEED IMPLANT N/A 04/03/2015   Procedure: RADIOACTIVE SEED IMPLANT/BRACHYTHERAPY IMPLANT;  Surgeon: Irine Seal, MD;  Location[facility];  Service: Urology;  Laterality: N/A;  . VIDEO ASSISTED THORACOSCOPY (VATS)/ LOBECTOMY Left 09/09/2014   Procedure: LEFT VIDEO ASSISTED THORACOSCOPY (VATS) with LEFT UPPER LOBECTOMY;  Surgeon: Melrose Nakayama, MD;  Location: [place];  Service: Thoracic;  Laterality: Left;  Marland Kitchen VIDEO BRONCHOSCOPY WITH ENDOBRONCHIAL NAVIGATION Right 04/24/2014   Procedure: VIDEO BRONCHOSCOPY WITH ENDOBRONCHIAL NAVIGATION;  Surgeon: Collene Gobble, MD;  Location: [place];  Service: Thoracic;  Laterality: Right;  Marland Kitchen VIDEO BRONCHOSCOPY WITH ENDOBRONCHIAL ULTRASOUND Right 04/24/2014   Procedure: VIDEO BRONCHOSCOPY WITH ENDOBRONCHIAL ULTRASOUND;  Surgeon: Collene Gobble, MD;  Location: [facility];  Service: Thoracic;  Laterality: Right;    REVIEW OF SYSTEMS:  A comprehensive review of systems was negative.   PHYSICAL EXAMINATION: General appearance: alert, cooperative and no distress Head: Normocephalic, without obvious abnormality,  atraumatic Neck: no adenopathy, no JVD, supple, symmetrical, trachea midline and thyroid not enlarged, symmetric, no tenderness/mass/nodules Lymph nodes: Cervical, supraclavicular, and axillary nodes normal. Resp: clear to auscultation bilaterally Back: symmetric, no curvature. ROM normal. No CVA tenderness. Cardio: regular rate and rhythm, S1, S2 normal, no murmur, click, rub or gallop GI: soft, non-tender; bowel sounds normal; no masses,  no organomegaly Extremities: extremities normal, atraumatic, no cyanosis or edema  ECOG PERFORMANCE STATUS: 0 - Asymptomatic  Blood pressure 130/79, pulse 90, temperature 98.4 F (36.9 C), temperature source Oral, resp. rate 20, height 6' (1.829 m), weight 183 lb 6.4 oz (83.2 kg), SpO2 99 %.  LABORATORY DATA: Lab Results  Component Value Date   WBC 7.8 05/25/2017   HGB 11.7 (L) 05/25/2017   HCT 35.2 (L) 05/25/2017   MCV 86.3 05/25/2017   PLT 460 (H) 05/25/2017      Chemistry      Component Value Date/Time   NA 139 05/25/2017 0734   K 3.7 05/25/2017 0734   CL 104 05/23/2015 0950   CO2 23 05/25/2017 0734   BUN 10.2 05/25/2017 0734   CREATININE 0.7 05/25/2017 0734      Component Value Date/Time   CALCIUM 9.5 05/25/2017 0734   ALKPHOS 80 05/25/2017 0734   AST 18 05/25/2017 0734   ALT 17 05/25/2017 0734   BILITOT 0.29 05/25/2017 0734       RADIOGRAPHIC STUDIES: Ct Chest W Contrast  Result Date: 05/25/2017 CLINICAL DATA:  Small cell lung cancer diagnosed 2015. LEFT upper lobectomy chemo radiation therapy. EXAM: CT CHEST WITH CONTRAST TECHNIQUE: Multidetector CT imaging of the chest was performed during intravenous contrast administration. CONTRAST:  77mL ISOVUE-300 IOPAMIDOL (ISOVUE-300) INJECTION 61% COMPARISON:  11/17/2016 FINDINGS: Cardiovascular: Coronary artery calcification and aortic atherosclerotic calcification. Mediastinum/Nodes: No axillary supraclavicular adenopathy. No mediastinal hilar adenopathy. No pericardial fluid.  Esophagus normal Lungs/Pleura: Centrilobular emphysema the upper lobes. Postsurgical change LEFT upper lobe. No suspicious nodularity. Upper Abdomen: Limited view of the liver, kidneys, pancreas are unremarkable. Normal adrenal glands. Musculoskeletal: No aggressive osseous lesion IMPRESSION: 1. No evidence of lung cancer recurrence. 2. Aortic Atherosclerosis (ICD10-I70.0) and Emphysema (ICD10-J43.9). Electronically Signed   By: Suzy Bouchard M.D.   On: 05/25/2017 09:31    ASSESSMENT AND PLAN:  This is a very pleasant 67 years old African-American male with a stage IIA non-small cell lung cancer, squamous cell carcinoma status post neoadjuvant systemic chemotherapy followed by left upper lobectomy and lymph node dissection in April 2016. The patient has no complaints today. His recent CT scan of the chest showed no evidence for disease recurrence. I discussed the scan results with the patient and recommended for him to continue on observation with repeat CT scan of the chest in 1 year. For smoke cessation, strongly encouraged the patient to quit smoking and he was given handout and information about quitting smoking. The patient was advised to call immediately if he has any concerning symptoms in the interval. The patient voices understanding of current disease status and treatment options and is in agreement with the current care plan. All questions were answered. The patient knows to call the clinic with any problems, questions or concerns. We can certainly see the patient much sooner if necessary. I spent 10 minutes counseling the  patient face to face. The total time spent in the appointment was 15 minutes. Disclaimer: This note was dictated with voice recognition software. Similar sounding words can inadvertently be transcribed and may not be corrected upon review.

## 2017-06-01 NOTE — Telephone Encounter (Signed)
Gave patient avs and calendar with appts per 1/2 los.  °

## 2018-04-12 ENCOUNTER — Encounter: Payer: Self-pay | Admitting: Internal Medicine

## 2018-05-18 ENCOUNTER — Ambulatory Visit (AMBULATORY_SURGERY_CENTER): Payer: Self-pay

## 2018-05-18 VITALS — Ht 74.0 in | Wt 199.4 lb

## 2018-05-18 DIAGNOSIS — Z1211 Encounter for screening for malignant neoplasm of colon: Secondary | ICD-10-CM

## 2018-05-18 NOTE — Progress Notes (Signed)
Denies allergies to eggs or soy products. Denies complication of anesthesia or sedation. Denies use of weight loss medication. Denies use of O2.   Emmi instructions declined.    A copy of patients instructions were also given to the patients niece so that she could help with instructions. I am not sure how well the patient reads. Patient was instructed to call if he had any questions.

## 2018-05-26 ENCOUNTER — Telehealth: Payer: Self-pay | Admitting: Internal Medicine

## 2018-05-26 NOTE — Telephone Encounter (Signed)
Patients wife states pt has radiation treatment on Monday 12.30.19 and his procedure is on 1.2.20. Patient wants to know if it is okay to still have procedure after radiation treatment. Patient had pv on 12.19.19.

## 2018-05-26 NOTE — Telephone Encounter (Signed)
Pt states he gets radiation for cancer- he gets that on Monday 12-30 for lung cancer, no chemo-  Pt states he gets radiation once a year.  Concerned that its the same week.   Please advise, Thanks for your time, Lelan Pons

## 2018-05-26 NOTE — Telephone Encounter (Signed)
Pt has left lung cancer diagnosed 2015 and prostate cancer   - he has a radiation treatment due Monday 05/29/2018- Wife questioning if he can have his colon as scheduled on 06-01-2018. Chemo was in 2016  [place] [facility]

## 2018-05-29 ENCOUNTER — Inpatient Hospital Stay: Payer: Medicare Other | Attending: Internal Medicine

## 2018-05-29 ENCOUNTER — Ambulatory Visit (HOSPITAL_COMMUNITY)
Admission: RE | Admit: 2018-05-29 | Discharge: 2018-05-29 | Disposition: A | Discharge status: Home / Self Care | Payer: Medicare Other | Source: Ambulatory Visit | Attending: Internal Medicine | Admitting: Internal Medicine

## 2018-05-29 DIAGNOSIS — C3490 Malignant neoplasm of unspecified part of unspecified bronchus or lung: Secondary | ICD-10-CM | POA: Diagnosis not present

## 2018-05-29 DIAGNOSIS — C3412 Malignant neoplasm of upper lobe, left bronchus or lung: Secondary | ICD-10-CM | POA: Insufficient documentation

## 2018-05-29 LAB — CBC WITH DIFFERENTIAL/PLATELET
Abs Immature Granulocytes: 0.03 10*3/uL (ref 0.00–0.07)
BASOS PCT: 1 %
Basophils Absolute: 0.1 10*3/uL (ref 0.0–0.1)
EOS ABS: 0.3 10*3/uL (ref 0.0–0.5)
EOS PCT: 3 %
HEMATOCRIT: 39.5 % (ref 39.0–52.0)
Hemoglobin: 13.4 g/dL (ref 13.0–17.0)
Immature Granulocytes: 0 %
LYMPHS ABS: 2.6 10*3/uL (ref 0.7–4.0)
Lymphocytes Relative: 29 %
MCH: 27.9 pg (ref 26.0–34.0)
MCHC: 33.9 g/dL (ref 30.0–36.0)
MCV: 82.3 fL (ref 80.0–100.0)
MONO ABS: 0.9 10*3/uL (ref 0.1–1.0)
MONOS PCT: 10 %
NEUTROS PCT: 57 %
Neutro Abs: 5.2 10*3/uL (ref 1.7–7.7)
PLATELETS: 209 10*3/uL (ref 150–400)
RBC: 4.8 MIL/uL (ref 4.22–5.81)
RDW: 14.5 % (ref 11.5–15.5)
WBC: 9 10*3/uL (ref 4.0–10.5)
nRBC: 0 % (ref 0.0–0.2)

## 2018-05-29 LAB — COMPREHENSIVE METABOLIC PANEL
ALT: 11 U/L (ref 0–44)
AST: 14 U/L — ABNORMAL LOW (ref 15–41)
Albumin: 3.8 g/dL (ref 3.5–5.0)
Alkaline Phosphatase: 69 U/L (ref 38–126)
Anion gap: 9 (ref 5–15)
BILIRUBIN TOTAL: 0.4 mg/dL (ref 0.3–1.2)
BUN: 9 mg/dL (ref 8–23)
CHLORIDE: 107 mmol/L (ref 98–111)
CO2: 24 mmol/L (ref 22–32)
CREATININE: 0.81 mg/dL (ref 0.61–1.24)
Calcium: 9.4 mg/dL (ref 8.9–10.3)
Glucose, Bld: 104 mg/dL — ABNORMAL HIGH (ref 70–99)
POTASSIUM: 4.2 mmol/L (ref 3.5–5.1)
Sodium: 140 mmol/L (ref 135–145)
TOTAL PROTEIN: 7.4 g/dL (ref 6.5–8.1)

## 2018-05-29 MED ORDER — IOHEXOL 300 MG/ML  SOLN
75.0000 mL | Freq: Once | INTRAMUSCULAR | Status: AC | PRN
  Administered 2018-05-29: 75 mL via INTRAVENOUS

## 2018-05-29 MED ORDER — SODIUM CHLORIDE (PF) 0.9 % IJ SOLN
INTRAMUSCULAR | Status: AC
  Filled 2018-05-29: qty 50

## 2018-05-29 NOTE — Telephone Encounter (Signed)
He is having a chest CT scan not XRT so its ok to continue as planned

## 2018-05-29 NOTE — Telephone Encounter (Signed)
Pt informed since just CT scan and no radiation, ok for colon Thursday 1-2 as scheduled - pt verbalized understanding

## 2018-06-01 ENCOUNTER — Ambulatory Visit (AMBULATORY_SURGERY_CENTER): Payer: Medicare Other | Admitting: Internal Medicine

## 2018-06-01 ENCOUNTER — Encounter: Payer: Self-pay | Admitting: Internal Medicine

## 2018-06-01 VITALS — BP 148/95 | HR 70 | Temp 97.1°F | Resp 16 | Ht 72.0 in | Wt 183.0 lb

## 2018-06-01 DIAGNOSIS — D122 Benign neoplasm of ascending colon: Secondary | ICD-10-CM

## 2018-06-01 DIAGNOSIS — Z1211 Encounter for screening for malignant neoplasm of colon: Secondary | ICD-10-CM

## 2018-06-01 DIAGNOSIS — D120 Benign neoplasm of cecum: Secondary | ICD-10-CM

## 2018-06-01 DIAGNOSIS — D124 Benign neoplasm of descending colon: Secondary | ICD-10-CM

## 2018-06-01 DIAGNOSIS — K635 Polyp of colon: Secondary | ICD-10-CM

## 2018-06-01 DIAGNOSIS — K5520 Angiodysplasia of colon without hemorrhage: Secondary | ICD-10-CM

## 2018-06-01 DIAGNOSIS — D123 Benign neoplasm of transverse colon: Secondary | ICD-10-CM

## 2018-06-01 DIAGNOSIS — D125 Benign neoplasm of sigmoid colon: Secondary | ICD-10-CM

## 2018-06-01 HISTORY — DX: Angiodysplasia of colon without hemorrhage: K55.20

## 2018-06-01 MED ORDER — SODIUM CHLORIDE 0.9 % IV SOLN: 500.0000 mL | Freq: Once | INTRAVENOUS | Status: DC

## 2018-06-01 NOTE — Progress Notes (Signed)
Pt's states no medical or surgical changes since previsit or office visit. 

## 2018-06-01 NOTE — Patient Instructions (Addendum)
I found and removed 7 small polyps.  I will let you know pathology results and when to have another routine colonoscopy by mail and/or My Chart.  You also have a condition called diverticulosis - common and not usually a problem. Please read the handout provided.  Also have several tiny AVM's or angiodysplasia. Blood vessels growing on surface of colon lining - not usually a problem. May bleed - rare - treatable if they do. We leave them alone unless they are causing problems.  I appreciate the opportunity to care for you. Gatha Mayer, MD, FACG   YOU HAD AN ENDOSCOPIC PROCEDURE TODAY AT [facility]:   Refer to the procedure report that was given to you for any specific questions about what was found during the examination.  If the procedure report does not answer your questions, please call your gastroenterologist to clarify.  If you requested that your care partner not be given the details of your procedure findings, then the procedure report has been included in a sealed envelope for you to review at your convenience later.  YOU SHOULD EXPECT: Some feelings of bloating in the abdomen. Passage of more gas than usual.  Walking can help get rid of the air that was put into your GI tract during the procedure and reduce the bloating. If you had a lower endoscopy (such as a colonoscopy or flexible sigmoidoscopy) you may notice spotting of blood in your stool or on the toilet paper. If you underwent a bowel prep for your procedure, you may not have a normal bowel movement for a few days.  Please Note:  You might notice some irritation and congestion in your nose or some drainage.  This is from the oxygen used during your procedure.  There is no need for concern and it should clear up in a day or so.  SYMPTOMS TO REPORT IMMEDIATELY:   Following lower endoscopy (colonoscopy or flexible sigmoidoscopy):  Excessive amounts of blood in the stool  Significant tenderness or  worsening of abdominal pains  Swelling of the abdomen that is new, acute  Fever of 100F or higher    For urgent or emergent issues, a gastroenterologist can be reached at any hour by calling [phone].   DIET:  We do recommend a small meal at first, but then you may proceed to your regular diet.  Drink plenty of fluids but you should avoid alcoholic beverages for 24 hours.  ACTIVITY:  You should plan to take it easy for the rest of today and you should NOT DRIVE or use heavy machinery until tomorrow (because of the sedation medicines used during the test).    FOLLOW UP: Our staff will call the number listed on your records the next business day following your procedure to check on you and address any questions or concerns that you may have regarding the information given to you following your procedure. If we do not reach you, we will leave a message.  However, if you are feeling well and you are not experiencing any problems, there is no need to return our call.  We will assume that you have returned to your regular daily activities without incident.  If any biopsies were taken you will be contacted by phone or by letter within the next 1-3 weeks.  Please call us at [phone] if you have not heard about the biopsies in 3 weeks.    SIGNATURES/CONFIDENTIALITY: You and/or your care partner have signed paperwork  which will be entered into your electronic medical record.  These signatures attest to the fact that that the information above on your After Visit Summary has been reviewed and is understood.  Full responsibility of the confidentiality of this discharge information lies with you and/or your care-partner.

## 2018-06-01 NOTE — Op Note (Signed)
[place] Patient Name: Larry [name] Procedure Date: 06/01/2018 10:34 AM MRN: [id number] Endoscopist: Gatha Mayer , MD Age: 68 Referring MD:  Date of Birth: [birth year] Gender: Male Account #: [id number] Procedure:                Colonoscopy Indications:              Screening for colorectal malignant neoplasm, Last                            colonoscopy: 2008 Medicines:                Propofol per Anesthesia, Monitored Anesthesia Care Procedure:                Pre-Anesthesia Assessment:                           - Prior to the procedure, a History and Physical                            was performed, and patient medications and                            allergies were reviewed. The patient's tolerance of                            previous anesthesia was also reviewed. The risks                            and benefits of the procedure and the sedation                            options and risks were discussed with the patient.                            All questions were answered, and informed consent                            was obtained. Prior Anticoagulants: The patient has                            taken no previous anticoagulant or antiplatelet                            agents. ASA Grade Assessment: II - A patient with                            mild systemic disease. After reviewing the risks                            and benefits, the patient was deemed in                            satisfactory condition to undergo the procedure.  After obtaining informed consent, the colonoscope                            was passed under direct vision. Throughout the                            procedure, the patient's blood pressure, pulse, and                            oxygen saturations were monitored continuously. The                            Model CF-HQ190L [id number]) scope was introduced                            through the anus and  advanced to the the cecum,                            identified by appendiceal orifice and ileocecal                            valve. The colonoscopy was performed without                            difficulty. The patient tolerated the procedure                            well. The quality of the bowel preparation was                            excellent. The ileocecal valve, appendiceal                            orifice, and rectum were photographed. The bowel                            preparation used was Miralax. Scope In: 10:39:29 AM Scope Out: 10:59:15 AM Scope Withdrawal Time: 0 hours 16 minutes 53 seconds  Total Procedure Duration: 0 hours 19 minutes 46 seconds  Findings:                 The perianal and digital rectal examinations were                            normal. Pertinent negatives include normal prostate                            (size, shape, and consistency).                           Seven sessile polyps were found in the sigmoid                            colon, descending colon, transverse colon,  ascending colon and cecum. The polyps were                            diminutive in size. These polyps were removed with                            a cold snare. Resection and retrieval were                            complete. Verification of patient identification                            for the specimen was done. Estimated blood loss was                            minimal.                           Multiple small angiodysplastic lesions without                            bleeding were found in the cecum.                           A few diverticula were found in the sigmoid colon.                           The exam was otherwise without abnormality on                            direct and retroflexion views. Complications:            No immediate complications. Estimated Blood Loss:     Estimated blood loss was minimal. Impression:                - Seven diminutive polyps in the sigmoid colon, in                            the descending colon, in the transverse colon, in                            the ascending colon and in the cecum, removed with                            a cold snare. Resected and retrieved.                           - Multiple non-bleeding small colonic                            angiodysplastic lesions. Cecum.                           - Diverticulosis in the sigmoid colon.                           -  The examination was otherwise normal on direct                            and retroflexion views. Recommendation:           - Patient has a contact number available for                            emergencies. The signs and symptoms of potential                            delayed complications were discussed with the                            patient. Return to normal activities tomorrow.                            Written discharge instructions were provided to the                            patient.                           - Resume previous diet.                           - Continue present medications.                           - Repeat colonoscopy is recommended. The                            colonoscopy date will be determined after pathology                            results from today's exam become available for                            review. Gatha Mayer, MD 06/01/2018 11:10:43 AM This report has been signed electronically.

## 2018-06-01 NOTE — Progress Notes (Signed)
Report given to PACU, vss 

## 2018-06-01 NOTE — Progress Notes (Signed)
Called to room to assist during endoscopic procedure.  Patient ID and intended procedure confirmed with present staff. Received instructions for my participation in the procedure from the performing physician.  

## 2018-06-02 ENCOUNTER — Telehealth: Payer: Self-pay | Admitting: *Deleted

## 2018-06-02 NOTE — Telephone Encounter (Signed)
  Follow up Call-  Call back number 06/01/2018  Post procedure Call Back phone  # [phone]  Permission to leave phone message Yes  Some recent data might be hidden     Patient questions:  Do you have a fever, pain , or abdominal swelling? No. Pain Score  0 *  Have you tolerated food without any problems? Yes.    Have you been able to return to your normal activities? Yes.    Do you have any questions about your discharge instructions: Diet   No. Medications  No. Follow up visit  No.  Do you have questions or concerns about your Care? No.  Actions: * If pain score is 4 or above: No action needed, pain <4.

## 2018-06-05 ENCOUNTER — Encounter: Payer: Self-pay | Admitting: Internal Medicine

## 2018-06-05 ENCOUNTER — Inpatient Hospital Stay: Payer: Medicare Other | Attending: Internal Medicine | Admitting: Internal Medicine

## 2018-06-05 ENCOUNTER — Telehealth: Payer: Self-pay | Admitting: Internal Medicine

## 2018-06-05 VITALS — BP 147/80 | HR 74 | Temp 98.0°F | Resp 20 | Ht 72.0 in | Wt 193.7 lb

## 2018-06-05 DIAGNOSIS — Z902 Acquired absence of lung [part of]: Secondary | ICD-10-CM | POA: Insufficient documentation

## 2018-06-05 DIAGNOSIS — C3490 Malignant neoplasm of unspecified part of unspecified bronchus or lung: Secondary | ICD-10-CM

## 2018-06-05 DIAGNOSIS — Z85118 Personal history of other malignant neoplasm of bronchus and lung: Secondary | ICD-10-CM | POA: Insufficient documentation

## 2018-06-05 DIAGNOSIS — C3412 Malignant neoplasm of upper lobe, left bronchus or lung: Secondary | ICD-10-CM | POA: Diagnosis not present

## 2018-06-05 DIAGNOSIS — Z8546 Personal history of malignant neoplasm of prostate: Secondary | ICD-10-CM | POA: Insufficient documentation

## 2018-06-05 DIAGNOSIS — F17210 Nicotine dependence, cigarettes, uncomplicated: Secondary | ICD-10-CM | POA: Diagnosis not present

## 2018-06-05 DIAGNOSIS — R918 Other nonspecific abnormal finding of lung field: Secondary | ICD-10-CM | POA: Diagnosis not present

## 2018-06-05 DIAGNOSIS — Z9221 Personal history of antineoplastic chemotherapy: Secondary | ICD-10-CM | POA: Diagnosis not present

## 2018-06-05 DIAGNOSIS — J449 Chronic obstructive pulmonary disease, unspecified: Secondary | ICD-10-CM

## 2018-06-05 DIAGNOSIS — C3492 Malignant neoplasm of unspecified part of left bronchus or lung: Secondary | ICD-10-CM

## 2018-06-05 NOTE — Progress Notes (Signed)
Larry [name] [phone]) [phone]   Fax:(336) [phone]  OFFICE PROGRESS NOTE  Larry Beals, NP [facility] [place] [address]  DIAGNOSIS:  1) Stage IIA (T1a, N1, M0) non-small cell lung cancer, squamous cell carcinoma diagnosed in November 2015, presented with left suprahilar soft tissue mass. 2) history of prostate adenocarcinoma  PRIOR THERAPY:  1) Neoadjuvant systemic chemotherapy with carboplatin for AUC of 5 and paclitaxel 175 MG/M2 every 3 weeks with Neulasta support. First dose 06/13/2014. Status post 3 cycle. 2) Status post Left video-assisted thoracoscopy, Thoracoscopic left upper lobectomy, Mediastinal lymph node dissection under the care of Dr. Roxan Hockey on 09/09/2014.  CURRENT THERAPY: Observation.  INTERVAL HISTORY: Larry [name] 68 y.o. male returns to the clinic today for follow-up visit.  The patient is feeling fine today with no concerning complaints.  He denied having any chest pain, shortness of breath but continues to have mild cough with no hemoptysis.  Unfortunately he continues to smoke few cigarettes every day.  He denied having any nausea, vomiting, diarrhea or constipation.  He denied having any headache or visual changes.  The patient had repeat CT scan of the chest performed recently and he is here for evaluation and discussion of his discuss results.  MEDICAL HISTORY: Past Medical History:  Diagnosis Date  . 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 Cataract   . Chronic cough   . COPD (chronic obstructive pulmonary disease) ([place])   . Dyspnea on exertion   . Full dentures   . Hyperlipidemia   . Non-small cell carcinoma of left lung [facility]) oncologist-- dr Julien Nordmann   dx Nov 2015left upper lobe, 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, N1, M0)  s/p  left upper lobectomy with node dissection and neoadjuvant systemic chemotherapy  . Productive cough    intermittantly  . Prostate cancer [facility])  urologist-- dr wrenn/  oncologsit-  dr Tammi Klippel   Stage T1c , Gleason 4+3, PSA 4.1, vol 26.73cc--  External RXT complete and Radiactive seed implants on 04-03-2015  . Stage II squamous cell carcinoma of left lung ([place]) 05/22/2014        ALLERGIES:  has No Known Allergies.  MEDICATIONS:  Current Outpatient Medications  Medication Sig Dispense Refill  . albuterol (PROVENTIL HFA;VENTOLIN HFA) 108 (90 Base) MCG/ACT inhaler Inhale 1-2 puffs into the lungs every 6 (six) hours as needed for wheezing or shortness of breath.    . pravastatin (PRAVACHOL) 80 MG tablet Take 80 mg by mouth every morning.   1  . SYMBICORT 160-4.5 MCG/ACT inhaler INHALE 2 PUFFS INTO THE LUNGS 2 TIMES DAILY 10.2 Inhaler 11   No current facility-administered medications for this visit.     SURGICAL HISTORY:  Past Surgical History:  Procedure Laterality Date  . COLONOSCOPY  12/ 2009  . ENDOBRONCHIAL ULTRASOUND Bilateral 04/08/2014   Procedure: ENDOBRONCHIAL ULTRASOUND;  Surgeon: Kathee Delton, MD;  Location: [facility];  Service: Cardiopulmonary;  Laterality: Bilateral;  . RADIOACTIVE SEED IMPLANT N/A 04/03/2015   Procedure: RADIOACTIVE SEED IMPLANT/BRACHYTHERAPY IMPLANT;  Surgeon: Irine Seal, MD;  Location: [facility];  Service: Urology;  Laterality: N/A;  . VIDEO ASSISTED THORACOSCOPY (VATS)/ LOBECTOMY Left 09/09/2014   Procedure: LEFT VIDEO ASSISTED THORACOSCOPY (VATS) with LEFT UPPER LOBECTOMY;  Surgeon: Melrose Nakayama, MD;  Location: [place];  Service: Thoracic;  Laterality: Left;  Marland Kitchen VIDEO BRONCHOSCOPY WITH ENDOBRONCHIAL NAVIGATION Right 04/24/2014   Procedure: VIDEO BRONCHOSCOPY WITH ENDOBRONCHIAL NAVIGATION;  Surgeon: Herbie Baltimore  Agustina Caroli, MD;  Location: [place];  Service: Thoracic;  Laterality: Right;  Marland Kitchen VIDEO BRONCHOSCOPY WITH ENDOBRONCHIAL ULTRASOUND Right 04/24/2014   Procedure: VIDEO BRONCHOSCOPY WITH ENDOBRONCHIAL ULTRASOUND;  Surgeon: Collene Gobble, MD;  Location: [place];  Service: Thoracic;   Laterality: Right;    REVIEW OF SYSTEMS:  A comprehensive review of systems was negative except for: Respiratory: positive for cough   PHYSICAL EXAMINATION: General appearance: alert, cooperative and no distress Head: Normocephalic, without obvious abnormality, atraumatic Neck: no adenopathy, no JVD, supple, symmetrical, trachea midline and thyroid not enlarged, symmetric, no tenderness/mass/nodules Lymph nodes: Cervical, supraclavicular, and axillary nodes normal. Resp: wheezes bilaterally Back: symmetric, no curvature. ROM normal. No CVA tenderness. Cardio: regular rate and rhythm, S1, S2 normal, no murmur, click, rub or gallop GI: soft, non-tender; bowel sounds normal; no masses,  no organomegaly Extremities: extremities normal, atraumatic, no cyanosis or edema  ECOG PERFORMANCE STATUS: 0 - Asymptomatic  Blood pressure (!) 147/80, pulse 74, temperature 98 F (36.7 C), temperature source Oral, resp. rate 20, height 6' (1.829 m), weight 193 lb 11.2 oz (87.9 kg), SpO2 97 %.  LABORATORY DATA: Lab Results  Component Value Date   WBC 9.0 05/29/2018   HGB 13.4 05/29/2018   HCT 39.5 05/29/2018   MCV 82.3 05/29/2018   PLT 209 05/29/2018      Chemistry      Component Value Date/Time   NA 140 05/29/2018 0832   NA 139 05/25/2017 0734   K 4.2 05/29/2018 0832   K 3.7 05/25/2017 0734   CL 107 05/29/2018 0832   CO2 24 05/29/2018 0832   CO2 23 05/25/2017 0734   BUN 9 05/29/2018 0832   BUN 10.2 05/25/2017 0734   CREATININE 0.81 05/29/2018 0832   CREATININE 0.7 05/25/2017 0734      Component Value Date/Time   CALCIUM 9.4 05/29/2018 0832   CALCIUM 9.5 05/25/2017 0734   ALKPHOS 69 05/29/2018 0832   ALKPHOS 80 05/25/2017 0734   AST 14 (L) 05/29/2018 0832   AST 18 05/25/2017 0734   ALT 11 05/29/2018 0832   ALT 17 05/25/2017 0734   BILITOT 0.4 05/29/2018 0832   BILITOT 0.29 05/25/2017 0734       RADIOGRAPHIC STUDIES: Ct Chest W Contrast  Result Date: 05/29/2018 CLINICAL  DATA:  Stage II squamous cell carcinoma on the left originally diagnosed in 2015 status post left upper lobectomy. No current treatment. History of prostate cancer. EXAM: CT CHEST WITH CONTRAST TECHNIQUE: Multidetector CT imaging of the chest was performed during intravenous contrast administration. CONTRAST:  52mL OMNIPAQUE IOHEXOL 300 MG/ML  SOLN COMPARISON:  Chest CT 05/25/2017 and 11/17/2016. FINDINGS: Cardiovascular: Atherosclerosis of the aorta, great vessels and coronary arteries. No acute vascular findings. The heart size is normal. There is no pericardial effusion. Mediastinum/Nodes: There are no enlarged mediastinal, hilar or axillary lymph nodes. The thyroid gland, trachea and esophagus demonstrate no significant findings. Lungs/Pleura: No pleural effusion or pneumothorax. Status post left upper lobectomy with stable subpleural scarring anteriorly in the left lung. There is moderate to severe centrilobular emphysema. Cystic lesion in the right middle lobe demonstrates continued slow growth, measuring 16 x 11 mm on image 106/7. There is a small soft tissue component along the inferior margin of this is which has slowly grown, measuring up to 8 mm on sagittal image 48/6. This measured 5 mm on the 11/17/2016 study. Small spiculated lesion posterior to the right hilum (sagittal image 81/6) is stable, likely scarring. No other enlarging nodules identified.  Upper abdomen: The visualized upper abdomen appears stable with mild contour irregularity of the liver. No focal hepatic or adrenal abnormalities are identified. Musculoskeletal/Chest wall: There is no chest wall mass or suspicious osseous finding. IMPRESSION: 1. Slow growth of mixed solid and cystic lesion in the right middle lobe compared with prior studies over the last 3 years. This finding is moderately suspicious for new bronchogenic carcinoma, but probably too small to biopsy or definitively evaluate by PET-CT. Short-term (6 month) CT follow-up  suggested. 2. No evidence of local recurrence or metastatic disease status post left upper lobe resection. 3. Aortic Atherosclerosis (ICD10-I70.0) and Emphysema (ICD10-J43.9). Electronically Signed   By: Richardean Sale M.D.   On: 05/29/2018 14:50    ASSESSMENT AND PLAN:  This is a very pleasant 68 years old African-American male with a stage IIA non-small cell lung cancer, squamous cell carcinoma status post neoadjuvant systemic chemotherapy followed by left upper lobectomy and lymph node dissection in April 2016. The patient is currently on observation and he is doing fine. Repeat CT scan of the chest showed slow growth of mixed solid and cystic lesion in the right middle lobe suspicious for slowly growing bronchogenic carcinoma but too small to be identified with a PET scan or biopsy at this point. I discussed the scan results with the patient recommended for him to continue on observation with repeat CT scan of the chest in 6 months for reevaluation of his disease. For smoking cessation I strongly encouraged the patient to quit smoking and he will try the nicotine patch. He was advised to call immediately if he has any concerning symptoms in the interval. The patient voices understanding of current disease status and treatment options and is in agreement with the current care plan. All questions were answered. The patient knows to call the clinic with any problems, questions or concerns. We can certainly see the patient much sooner if necessary. I spent 10 minutes counseling the patient face to face. The total time spent in the appointment was 15 minutes. Disclaimer: This note was dictated with voice recognition software. Similar sounding words can inadvertently be transcribed and may not be corrected upon review.

## 2018-06-05 NOTE — Telephone Encounter (Signed)
Printed calendar and avs. °

## 2018-06-07 ENCOUNTER — Encounter: Payer: Self-pay | Admitting: Internal Medicine

## 2018-06-07 DIAGNOSIS — Z860101 Personal history of adenomatous and serrated colon polyps: Secondary | ICD-10-CM

## 2018-06-07 DIAGNOSIS — Z86010 Personal history of colonic polyps: Secondary | ICD-10-CM

## 2018-06-07 HISTORY — DX: Personal history of adenomatous and serrated colon polyps: Z86.0101

## 2018-06-07 HISTORY — DX: Personal history of colonic polyps: Z86.010

## 2018-06-07 NOTE — Progress Notes (Signed)
7 adenomas - diminutive Recall 2023

## 2018-11-30 ENCOUNTER — Inpatient Hospital Stay: Payer: Medicare Other | Attending: Internal Medicine

## 2018-11-30 ENCOUNTER — Ambulatory Visit (HOSPITAL_COMMUNITY)
Admission: RE | Admit: 2018-11-30 | Discharge: 2018-11-30 | Disposition: A | Discharge status: Home / Self Care | Payer: Medicare Other | Source: Ambulatory Visit | Attending: Internal Medicine | Admitting: Internal Medicine

## 2018-11-30 ENCOUNTER — Other Ambulatory Visit: Payer: Self-pay

## 2018-11-30 DIAGNOSIS — J449 Chronic obstructive pulmonary disease, unspecified: Secondary | ICD-10-CM | POA: Diagnosis not present

## 2018-11-30 DIAGNOSIS — I2510 Atherosclerotic heart disease of native coronary artery without angina pectoris: Secondary | ICD-10-CM | POA: Insufficient documentation

## 2018-11-30 DIAGNOSIS — F17210 Nicotine dependence, cigarettes, uncomplicated: Secondary | ICD-10-CM | POA: Insufficient documentation

## 2018-11-30 DIAGNOSIS — C3412 Malignant neoplasm of upper lobe, left bronchus or lung: Secondary | ICD-10-CM | POA: Diagnosis not present

## 2018-11-30 DIAGNOSIS — C3490 Malignant neoplasm of unspecified part of unspecified bronchus or lung: Secondary | ICD-10-CM

## 2018-11-30 DIAGNOSIS — Z8546 Personal history of malignant neoplasm of prostate: Secondary | ICD-10-CM | POA: Diagnosis not present

## 2018-11-30 DIAGNOSIS — I10 Essential (primary) hypertension: Secondary | ICD-10-CM | POA: Insufficient documentation

## 2018-11-30 LAB — CMP (CANCER CENTER ONLY)
ALT: 27 U/L (ref 0–44)
AST: 18 U/L (ref 15–41)
Albumin: 3.6 g/dL (ref 3.5–5.0)
Alkaline Phosphatase: 78 U/L (ref 38–126)
Anion gap: 11 (ref 5–15)
BUN: 9 mg/dL (ref 8–23)
CO2: 21 mmol/L — ABNORMAL LOW (ref 22–32)
Calcium: 9 mg/dL (ref 8.9–10.3)
Chloride: 104 mmol/L (ref 98–111)
Creatinine: 0.91 mg/dL (ref 0.61–1.24)
GFR, Est AFR Am: >60 mL/min (ref >60)
GFR, Estimated: >60 mL/min (ref >60)
Glucose, Bld: 128 mg/dL — ABNORMAL HIGH (ref 70–99)
Potassium: 3.7 mmol/L (ref 3.5–5.1)
Sodium: 136 mmol/L (ref 135–145)
Total Bilirubin: <0.2 mg/dL — ABNORMAL LOW (ref 0.3–1.2)
Total Protein: 7.1 g/dL (ref 6.5–8.1)

## 2018-11-30 LAB — CBC WITH DIFFERENTIAL (CANCER CENTER ONLY)
Abs Immature Granulocytes: 0.06 10*3/uL (ref 0.00–0.07)
Basophils Absolute: 0 10*3/uL (ref 0.0–0.1)
Basophils Relative: 0 %
Eosinophils Absolute: 0.3 10*3/uL (ref 0.0–0.5)
Eosinophils Relative: 3 %
HCT: 38.1 % — ABNORMAL LOW (ref 39.0–52.0)
Hemoglobin: 13.1 g/dL (ref 13.0–17.0)
Immature Granulocytes: 1 %
Lymphocytes Relative: 18 %
Lymphs Abs: 1.7 10*3/uL (ref 0.7–4.0)
MCH: 28.2 pg (ref 26.0–34.0)
MCHC: 34.4 g/dL (ref 30.0–36.0)
MCV: 81.9 fL (ref 80.0–100.0)
Monocytes Absolute: 1.1 10*3/uL — ABNORMAL HIGH (ref 0.1–1.0)
Monocytes Relative: 12 %
Neutro Abs: 6.3 10*3/uL (ref 1.7–7.7)
Neutrophils Relative %: 66 %
Platelet Count: 173 10*3/uL (ref 150–400)
RBC: 4.65 MIL/uL (ref 4.22–5.81)
RDW: 13.8 % (ref 11.5–15.5)
WBC Count: 9.5 10*3/uL (ref 4.0–10.5)
nRBC: 0 % (ref 0.0–0.2)

## 2018-11-30 MED ORDER — SODIUM CHLORIDE (PF) 0.9 % IJ SOLN
INTRAMUSCULAR | Status: AC
  Filled 2018-11-30: qty 50

## 2018-11-30 MED ORDER — IOHEXOL 300 MG/ML  SOLN
75.0000 mL | Freq: Once | INTRAMUSCULAR | Status: AC | PRN
  Administered 2018-11-30: 75 mL via INTRAVENOUS

## 2018-12-06 ENCOUNTER — Inpatient Hospital Stay (HOSPITAL_BASED_OUTPATIENT_CLINIC_OR_DEPARTMENT_OTHER): Payer: Medicare Other | Admitting: Internal Medicine

## 2018-12-06 ENCOUNTER — Telehealth: Payer: Self-pay | Admitting: Internal Medicine

## 2018-12-06 ENCOUNTER — Other Ambulatory Visit: Payer: Self-pay

## 2018-12-06 ENCOUNTER — Encounter: Payer: Self-pay | Admitting: Internal Medicine

## 2018-12-06 VITALS — BP 148/82 | HR 89 | Temp 98.6°F | Resp 20 | Ht 72.0 in | Wt 194.3 lb

## 2018-12-06 DIAGNOSIS — J449 Chronic obstructive pulmonary disease, unspecified: Secondary | ICD-10-CM | POA: Diagnosis not present

## 2018-12-06 DIAGNOSIS — C3412 Malignant neoplasm of upper lobe, left bronchus or lung: Secondary | ICD-10-CM

## 2018-12-06 DIAGNOSIS — C3490 Malignant neoplasm of unspecified part of unspecified bronchus or lung: Secondary | ICD-10-CM

## 2018-12-06 DIAGNOSIS — I10 Essential (primary) hypertension: Secondary | ICD-10-CM

## 2018-12-06 DIAGNOSIS — Z8546 Personal history of malignant neoplasm of prostate: Secondary | ICD-10-CM

## 2018-12-06 DIAGNOSIS — C3492 Malignant neoplasm of unspecified part of left bronchus or lung: Secondary | ICD-10-CM

## 2018-12-06 DIAGNOSIS — F17210 Nicotine dependence, cigarettes, uncomplicated: Secondary | ICD-10-CM | POA: Diagnosis not present

## 2018-12-06 DIAGNOSIS — Z720 Tobacco use: Secondary | ICD-10-CM

## 2018-12-06 NOTE — Telephone Encounter (Signed)
Scheduled appt per 7/8 los. Printed calendar and avs.   Gave patient the number to central radiology.

## 2018-12-06 NOTE — Progress Notes (Signed)
[place] Telephone:(336) [phone]   Fax:(336) [phone]  OFFICE PROGRESS NOTE  [name] [id number]  DIAGNOSIS:  1) Stage IIA (T1a, N1, M0) non-small cell lung cancer, squamous cell carcinoma diagnosed in November 2015, presented with left suprahilar soft tissue mass. 2) history of prostate adenocarcinoma  PRIOR THERAPY:  1) Neoadjuvant systemic chemotherapy with carboplatin for AUC of 5 and paclitaxel 175 MG/M2 every 3 weeks with Neulasta support. First dose 06/13/2014. Status post 3 cycle. 2) Status post Left video-assisted thoracoscopy, Thoracoscopic left upper lobectomy, Mediastinal lymph node dissection under the care of Dr. Roxan Hockey on 09/09/2014.  CURRENT THERAPY: Observation.  INTERVAL HISTORY: Larry [name] 68 y.o. male returns to the clinic today for follow-up visit.  The patient is feeling fine today with no concerning complaints.  He denied having any chest pain, shortness of breath, cough or hemoptysis.  He denied having any fever or chills.  He has no nausea, vomiting, diarrhea or constipation.  He has no recent weight loss or night sweats.  He is here today for evaluation with repeat CT scan of the chest for restaging of his disease.  MEDICAL HISTORY: Past Medical History:  Diagnosis Date  . 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 Cataract   . Chronic cough   . COPD (chronic obstructive pulmonary disease) ([place])   . Dyspnea on exertion   . Full dentures   . Hx of adenomatous colonic polyps 06/07/2018  . Hyperlipidemia   . Non-small cell carcinoma of left lung [facility]) oncologist-- dr Julien Nordmann   dx Nov 2015left upper lobe, 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, N1, M0)  s/p  left upper lobectomy with node dissection and neoadjuvant systemic chemotherapy  . Productive cough    intermittantly  . Prostate cancer [facility]) urologist-- dr wrenn/  oncologsit-  dr Tammi Klippel   Stage T1c , Gleason 4+3, PSA 4.1, vol  26.73cc--  External RXT complete and Radiactive seed implants on 04-03-2015  . Stage II squamous cell carcinoma of left lung ([place]) 05/22/2014        ALLERGIES:  has No Known Allergies.  MEDICATIONS:  Current Outpatient Medications  Medication Sig Dispense Refill  . albuterol (PROVENTIL HFA;VENTOLIN HFA) 108 (90 Base) MCG/ACT inhaler Inhale 1-2 puffs into the lungs every 6 (six) hours as needed for wheezing or shortness of breath.    . pravastatin (PRAVACHOL) 80 MG tablet Take 80 mg by mouth every morning.   1  . SYMBICORT 160-4.5 MCG/ACT inhaler INHALE 2 PUFFS INTO THE LUNGS 2 TIMES DAILY 10.2 Inhaler 11   No current facility-administered medications for this visit.     SURGICAL HISTORY:  Past Surgical History:  Procedure Laterality Date  . COLONOSCOPY  12/ 2009  . ENDOBRONCHIAL ULTRASOUND Bilateral 04/08/2014   Procedure: ENDOBRONCHIAL ULTRASOUND;  Surgeon: Kathee Delton, MD;  Location[facility];  Service: Cardiopulmonary;  Laterality: Bilateral;  . RADIOACTIVE SEED IMPLANT N/A 04/03/2015   Procedure: RADIOACTIVE SEED IMPLANT/BRACHYTHERAPY IMPLANT;  Surgeon: Irine Seal, MD;  Location: [facility];  Service: Urology;  Laterality: N/A;  . VIDEO ASSISTED THORACOSCOPY (VATS)/ LOBECTOMY Left 09/09/2014   Procedure: LEFT VIDEO ASSISTED THORACOSCOPY (VATS) with LEFT UPPER LOBECTOMY;  Surgeon: Melrose Nakayama, MD;  Location: [place];  Service: Thoracic;  Laterality: Left;  Marland Kitchen VIDEO BRONCHOSCOPY WITH ENDOBRONCHIAL NAVIGATION Right 04/24/2014   Procedure: VIDEO BRONCHOSCOPY WITH ENDOBRONCHIAL NAVIGATION;  Surgeon: Collene Gobble, MD;  Location: [place];  Service:  Thoracic;  Laterality: Right;  Marland Kitchen VIDEO BRONCHOSCOPY WITH ENDOBRONCHIAL ULTRASOUND Right 04/24/2014   Procedure: VIDEO BRONCHOSCOPY WITH ENDOBRONCHIAL ULTRASOUND;  Surgeon: Collene Gobble, MD;  Location: [place];  Service: Thoracic;  Laterality: Right;    REVIEW OF SYSTEMS:  A comprehensive review of systems was  negative.   PHYSICAL EXAMINATION: General appearance: alert, cooperative and no distress Head: Normocephalic, without obvious abnormality, atraumatic Neck: no adenopathy, no JVD, supple, symmetrical, trachea midline and thyroid not enlarged, symmetric, no tenderness/mass/nodules Lymph nodes: Cervical, supraclavicular, and axillary nodes normal. Resp: clear to auscultation bilaterally Back: symmetric, no curvature. ROM normal. No CVA tenderness. Cardio: regular rate and rhythm, S1, S2 normal, no murmur, click, rub or gallop GI: soft, non-tender; bowel sounds normal; no masses,  no organomegaly Extremities: extremities normal, atraumatic, no cyanosis or edema  ECOG PERFORMANCE STATUS: 0 - Asymptomatic  Blood pressure (!) 148/82, pulse 89, temperature 98.6 F (37 C), temperature source Oral, resp. rate 20, height 6' (1.829 m), weight 194 lb 4.8 oz (88.1 kg), SpO2 97 %.  LABORATORY DATA: Lab Results  Component Value Date   WBC 9.5 11/30/2018   HGB 13.1 11/30/2018   HCT 38.1 (L) 11/30/2018   MCV 81.9 11/30/2018   PLT 173 11/30/2018      Chemistry      Component Value Date/Time   NA 136 11/30/2018 0806   NA 139 05/25/2017 0734   K 3.7 11/30/2018 0806   K 3.7 05/25/2017 0734   CL 104 11/30/2018 0806   CO2 21 (L) 11/30/2018 0806   CO2 23 05/25/2017 0734   BUN 9 11/30/2018 0806   BUN 10.2 05/25/2017 0734   CREATININE 0.91 11/30/2018 0806   CREATININE 0.7 05/25/2017 0734      Component Value Date/Time   CALCIUM 9.0 11/30/2018 0806   CALCIUM 9.5 05/25/2017 0734   ALKPHOS 78 11/30/2018 0806   ALKPHOS 80 05/25/2017 0734   AST 18 11/30/2018 0806   AST 18 05/25/2017 0734   ALT 27 11/30/2018 0806   ALT 17 05/25/2017 0734   BILITOT <0.2 (L) 11/30/2018 0806   BILITOT 0.29 05/25/2017 0734       RADIOGRAPHIC STUDIES: Ct Chest W Contrast  Result Date: 11/30/2018 CLINICAL DATA:  Non-small-cell lung cancer diagnosed in 2015 with left suprahilar soft tissue mass. Status post 3  cycles of chemotherapy in 2016. Left upper lobectomy 09/09/2014. Mediastinal lymph node dissection. Prostate carcinoma. EXAM: CT CHEST WITH CONTRAST TECHNIQUE: Multidetector CT imaging of the chest was performed during intravenous contrast administration. CONTRAST:  75mL OMNIPAQUE IOHEXOL 300 MG/ML  SOLN COMPARISON:  05/29/2018 FINDINGS: Cardiovascular: Aortic and branch vessel atherosclerosis. Normal heart size, without pericardial effusion. Multivessel coronary artery atherosclerosis. No central pulmonary embolism, on this non-dedicated study. Mediastinum/Nodes: No supraclavicular adenopathy. No mediastinal or hilar adenopathy. Lungs/Pleura: No pleural fluid. Moderate bullous type emphysema. Lower lobe predominant bronchial wall thickening. Status post left upper lobectomy. The mixed attenuation right middle lobe pulmonary nodule appears morphologically similar. 1.5 x 1.1 cm on image 107/7. Compare 1.6 x 1.1 cm on the prior. The soft tissue component medially measures 7 mm on sagittal image 30 today versus 8 mm on the prior. Superior segment right lower lobe spiculated density is similar, including at 8 mm on 78/7. Presumed left lower lobe scarring anteriorly, similar including at 7 mm on image 96/7. Upper Abdomen: Normal imaged portions of the liver, spleen, stomach, pancreas, adrenal glands, kidneys. Abdominal aortic and branch vessel atherosclerosis. Musculoskeletal: No acute osseous abnormality. IMPRESSION: 1. Status post  left upper lobectomy, without recurrent or metastatic disease. 2. Mixed attenuation right middle lobe pulmonary nodule is similar to on the prior exam. Recommend attention on follow-up. 3. Other areas of pulmonary nodularity are favored to represent foci of scarring, and are not significantly changed. 4. Aortic atherosclerosis (ICD10-I70.0), coronary artery atherosclerosis and emphysema (ICD10-J43.9). Electronically Signed   By: Abigail Miyamoto M.D.   On: 11/30/2018 10:25    ASSESSMENT AND  PLAN:  This is a very pleasant 68 years old African-American male with a stage IIA non-small cell lung cancer, squamous cell carcinoma status post neoadjuvant systemic chemotherapy followed by left upper lobectomy and lymph node dissection in April 2016. The patient is currently on observation and he is feeling fine with no concerning complaints. He had repeat CT scan of the chest performed recently.  I personally and independently reviewed the scans and discussed the results with the patient today. His scan showed no concerning findings for disease recurrence or progression. I recommended for the patient to continue on observation with repeat CT scan of the chest in 6 months. For hypertension he was advised to take his blood pressure medication as prescribed and to monitor it closely at home. For smoking cessation I strongly encouraged the patient to quit smoking and he will try the nicotine patch. The patient was advised to call immediately if he has any concerning symptoms in the interval. The patient voices understanding of current disease status and treatment options and is in agreement with the current care plan. All questions were answered. The patient knows to call the clinic with any problems, questions or concerns. We can certainly see the patient much sooner if necessary. I spent 10 minutes counseling the patient face to face. The total time spent in the appointment was 15 minutes. Disclaimer: This note was dictated with voice recognition software. Similar sounding words can inadvertently be transcribed and may not be corrected upon review.

## 2019-02-18 DIAGNOSIS — I2510 Atherosclerotic heart disease of native coronary artery without angina pectoris: Secondary | ICD-10-CM | POA: Insufficient documentation

## 2019-02-18 DIAGNOSIS — I700 Atherosclerosis of aorta: Secondary | ICD-10-CM | POA: Insufficient documentation

## 2019-02-18 NOTE — Progress Notes (Signed)
No show

## 2019-02-19 ENCOUNTER — Ambulatory Visit (INDEPENDENT_AMBULATORY_CARE_PROVIDER_SITE_OTHER): Payer: Medicare Other | Admitting: Cardiology

## 2019-02-19 DIAGNOSIS — I700 Atherosclerosis of aorta: Secondary | ICD-10-CM | POA: Diagnosis not present

## 2019-02-19 DIAGNOSIS — I2510 Atherosclerotic heart disease of native coronary artery without angina pectoris: Secondary | ICD-10-CM | POA: Diagnosis not present

## 2019-03-02 ENCOUNTER — Encounter: Payer: Self-pay | Admitting: Cardiology

## 2019-03-02 ENCOUNTER — Ambulatory Visit (INDEPENDENT_AMBULATORY_CARE_PROVIDER_SITE_OTHER): Payer: Medicare Other | Admitting: Cardiology

## 2019-03-02 ENCOUNTER — Other Ambulatory Visit: Payer: Self-pay

## 2019-03-02 VITALS — BP 166/89 | HR 85 | Temp 98.0°F | Ht 72.0 in | Wt 203.0 lb

## 2019-03-02 DIAGNOSIS — I700 Atherosclerosis of aorta: Secondary | ICD-10-CM

## 2019-03-02 DIAGNOSIS — Z136 Encounter for screening for cardiovascular disorders: Secondary | ICD-10-CM | POA: Insufficient documentation

## 2019-03-02 DIAGNOSIS — I2510 Atherosclerotic heart disease of native coronary artery without angina pectoris: Secondary | ICD-10-CM | POA: Diagnosis not present

## 2019-03-02 DIAGNOSIS — I10 Essential (primary) hypertension: Secondary | ICD-10-CM | POA: Diagnosis not present

## 2019-03-02 DIAGNOSIS — Z720 Tobacco use: Secondary | ICD-10-CM

## 2019-03-02 MED ORDER — AMLODIPINE BESYLATE 10 MG PO TABS: 10.0000 mg | ORAL_TABLET | Freq: Every day | ORAL | 3 refills | Status: DC

## 2019-03-02 MED ORDER — AMLODIPINE BESYLATE 5 MG PO TABS: 5.0000 mg | ORAL_TABLET | Freq: Every day | ORAL | 3 refills | Status: DC

## 2019-03-02 MED ORDER — BUPROPION HCL ER (SR) 100 MG PO TB12: 100.0000 mg | ORAL_TABLET | Freq: Two times a day (BID) | ORAL | 3 refills | Status: DC

## 2019-03-02 NOTE — Progress Notes (Signed)
Patient referred by [name], NP for CAD  Subjective:   Larry [name], male    DOB: Apr 12, 1951, 68 y.o.   MRN: [id number]   Chief Complaint  Patient presents with  . Coronary Artery Disease  . New Patient (Initial Visit)     HPI  68 y/o [place] male with hyperlipidemia, COPD, h/o Stage IIA (T1a, N1, M0) non-small cell lung cancer, squamous cell carcinoma s/pVATS guided left upper lobectomy and mediastinal LN dissection 2016, neoadjuvant systemic chemotherapy with carboplatin and paclitaxel, now referred for management of coronary artery disease.   Recent CT chest showed aortic atherosclerosis and coronary artery atherosclerosis. Patient lives alone, does not work. He walks about a mile once a week or so. He denies chest pain, shortness of breath, palpitations, leg edema, orthopnea, PND, TIA/syncope. Unfortunately, he continues to smoke 1.5 pack/day.   Past Medical History:  Diagnosis Date  . 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 Cataract   . Chronic cough   . COPD (chronic obstructive pulmonary disease) ([place])   . Dyspnea on exertion   . Full dentures   . Hx of adenomatous colonic polyps 06/07/2018  . Hyperlipidemia   . Non-small cell carcinoma of left lung [facility]) oncologist-- dr Julien Nordmann   dx Nov 2015left upper lobe, 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, N1, M0)  s/p  left upper lobectomy with node dissection and neoadjuvant systemic chemotherapy  . Productive cough    intermittantly  . Prostate cancer [facility]) urologist-- dr wrenn/  oncologsit-  dr Tammi Klippel   Stage T1c , Gleason 4+3, PSA 4.1, vol 26.73cc--  External RXT complete and Radiactive seed implants on 04-03-2015  . Stage II squamous cell carcinoma of left lung ([place]) 05/22/2014         Past Surgical History:  Procedure Laterality Date  . COLONOSCOPY  12/ 2009  . ENDOBRONCHIAL ULTRASOUND Bilateral 04/08/2014   Procedure: ENDOBRONCHIAL ULTRASOUND;  Surgeon: Kathee Delton, MD;  Location: [facility]  ENDOSCOPY;  Service: Cardiopulmonary;  Laterality: Bilateral;  . RADIOACTIVE SEED IMPLANT N/A 04/03/2015   Procedure: RADIOACTIVE SEED IMPLANT/BRACHYTHERAPY IMPLANT;  Surgeon: Irine Seal, MD;  Location: [facility];  Service: Urology;  Laterality: N/A;  . VIDEO ASSISTED THORACOSCOPY (VATS)/ LOBECTOMY Left 09/09/2014   Procedure: LEFT VIDEO ASSISTED THORACOSCOPY (VATS) with LEFT UPPER LOBECTOMY;  Surgeon: Melrose Nakayama, MD;  Location: [place];  Service: Thoracic;  Laterality: Left;  Marland Kitchen VIDEO BRONCHOSCOPY WITH ENDOBRONCHIAL NAVIGATION Right 04/24/2014   Procedure: VIDEO BRONCHOSCOPY WITH ENDOBRONCHIAL NAVIGATION;  Surgeon: Collene Gobble, MD;  Location: [place];  Service: Thoracic;  Laterality: Right;  Marland Kitchen VIDEO BRONCHOSCOPY WITH ENDOBRONCHIAL ULTRASOUND Right 04/24/2014   Procedure: VIDEO BRONCHOSCOPY WITH ENDOBRONCHIAL ULTRASOUND;  Surgeon: Collene Gobble, MD;  Location: [place];  Service: Thoracic;  Laterality: Right;     Social History   Socioeconomic History  . Marital status: Single    Spouse name: Not on file  . Number of children: Not on file  . Years of education: Not on file  . Highest education level: Not on file  Occupational History  . Occupation: unemployed  Social Needs  . Financial resource strain: Not on file  . Food insecurity    Worry: Not on file    Inability: Not on file  . Transportation needs    Medical: Not on file    Non-medical: Not on file  Tobacco Use  . Smoking status: Current Every Day Smoker    Packs/day: 1.00  Years: 45.00    Pack years: 45.00    Types: Cigarettes  . Smokeless tobacco: Never Used  . Tobacco comment: 1/2 pack per day-- down from from 1 ppd  Substance and Sexual Activity  . Alcohol use: No    Alcohol/week: 0.0 standard drinks  . Drug use: No  . Sexual activity: Not on file  Lifestyle  . Physical activity    Days per week: Not on file    Minutes per session: Not on file  . Stress: Not on file  Relationships  .  Social Herbalist on phone: Not on file    Gets together: Not on file    Attends religious service: Not on file    Active member of club or organization: Not on file    Attends meetings of clubs or organizations: Not on file    Relationship status: Not on file  . Intimate partner violence    Fear of current or ex partner: Not on file    Emotionally abused: Not on file    Physically abused: Not on file    Forced sexual activity: Not on file  Other Topics Concern  . Not on file  Social History Narrative  . Not on file     Family History  Problem Relation Age of Onset  . Heart attack Mother   . Diabetes Mother   . Heart attack Father   . Heart failure Father   . Cancer Brother   . Colon cancer Neg Hx   . Esophageal cancer Neg Hx   . Rectal cancer Neg Hx   . Stomach cancer Neg Hx      Current Outpatient Medications on File Prior to Visit  Medication Sig Dispense Refill  . albuterol (PROVENTIL HFA;VENTOLIN HFA) 108 (90 Base) MCG/ACT inhaler Inhale 1-2 puffs into the lungs every 6 (six) hours as needed for wheezing or shortness of breath.    . pravastatin (PRAVACHOL) 80 MG tablet Take 80 mg by mouth every morning.   1  . SYMBICORT 160-4.5 MCG/ACT inhaler INHALE 2 PUFFS INTO THE LUNGS 2 TIMES DAILY 10.2 Inhaler 11   No current facility-administered medications on file prior to visit.     Cardiovascular studies:  EKG 03/02/2019: Sinus rhythm 80 bpm. Old anteroseptal infarct.   CT chest 11/30/2018: 1. Status post left upper lobectomy, without recurrent or metastatic disease. 2. Mixed attenuation right middle lobe pulmonary nodule is similar to on the prior exam. Recommend attention on follow-up. 3. Other areas of pulmonary nodularity are favored to represent foci of scarring, and are not significantly changed. 4. Aortic atherosclerosis (ICD10-I70.0), coronary artery atherosclerosis and emphysema (ICD10-J43.9).  Recent labs: Results for SHIGERU, LAMPERT (MRN  [id number]) as of 03/02/2019 09:30  Ref. Range 11/30/2018 08:06  Sodium Latest Ref Range: 135 - 145 mmol/L 136  Potassium Latest Ref Range: 3.5 - 5.1 mmol/L 3.7  Chloride Latest Ref Range: 98 - 111 mmol/L 104  CO2 Latest Ref Range: 22 - 32 mmol/L 21 (L)  Glucose Latest Ref Range: 70 - 99 mg/dL 128 (H)  BUN Latest Ref Range: 8 - 23 mg/dL 9  Creatinine Latest Ref Range: 0.61 - 1.24 mg/dL 0.91  Calcium Latest Ref Range: 8.9 - 10.3 mg/dL 9.0  Anion gap Latest Ref Range: 5 - 15  11  Alkaline Phosphatase Latest Ref Range: 38 - 126 U/L 78  Albumin Latest Ref Range: 3.5 - 5.0 g/dL 3.6  AST Latest Ref Range: 15 - 41 U/L  18  ALT Latest Ref Range: 0 - 44 U/L 27  Total Protein Latest Ref Range: 6.5 - 8.1 g/dL 7.1  Total Bilirubin Latest Ref Range: 0.3 - 1.2 mg/dL <0.2 (L)  GFR, Est Non African American Latest Ref Range: >60 mL/min >60  GFR, Est African American Latest Ref Range: >60 mL/min >60   Results for LOKI, WUTHRICH (MRN [id number]) as of 03/02/2019 09:30  Ref. Range 11/30/2018 08:06  WBC Latest Ref Range: 4.0 - 10.5 K/uL 9.5  RBC Latest Ref Range: 4.22 - 5.81 MIL/uL 4.65  Hemoglobin Latest Ref Range: 13.0 - 17.0 g/dL 13.1  HCT Latest Ref Range: 39.0 - 52.0 % 38.1 (L)  MCV Latest Ref Range: 80.0 - 100.0 fL 81.9  MCH Latest Ref Range: 26.0 - 34.0 pg 28.2  MCHC Latest Ref Range: 30.0 - 36.0 g/dL 34.4  RDW Latest Ref Range: 11.5 - 15.5 % 13.8  Platelets Latest Ref Range: 150 - 400 K/uL 173  nRBC Latest Ref Range: 0.0 - 0.2 % 0.0    Review of Systems  Constitution: Negative for decreased appetite, malaise/fatigue, weight gain and weight loss.  HENT: Negative for congestion.   Eyes: Negative for visual disturbance.  Cardiovascular: Negative for chest pain, dyspnea on exertion, leg swelling, palpitations and syncope.  Respiratory: Positive for shortness of breath (Only when he smokes). Negative for cough.   Endocrine: Negative for cold intolerance.  Hematologic/Lymphatic: Does not bruise/bleed  easily.  Skin: Negative for itching and rash.  Musculoskeletal: Negative for myalgias.  Gastrointestinal: Negative for abdominal pain, nausea and vomiting.  Genitourinary: Negative for dysuria.  Neurological: Negative for dizziness and weakness.  Psychiatric/Behavioral: The patient is not nervous/anxious.   All other systems reviewed and are negative.       Vitals:   03/02/19 1406  BP: (!) 166/89  Pulse: 85  Temp: 98 F (36.7 C)  SpO2: 97%     Body mass index is 27.53 kg/m. Filed Weights   03/02/19 1406  Weight: 203 lb (92.1 kg)     Objective:   Physical Exam  Constitutional: He is oriented to person, place, and time. He appears well-developed and well-nourished. No distress.  HENT:  Head: Normocephalic and atraumatic.  Eyes: Pupils are equal, round, and reactive to light. Conjunctivae are normal.  Neck: No JVD present.  Cardiovascular: Normal rate and regular rhythm.  No murmur heard. Pulses:      Femoral pulses are 2+ on the right side and 2+ on the left side.      Dorsalis pedis pulses are 1+ on the right side and 1+ on the left side.       Posterior tibial pulses are 0 on the right side and 0 on the left side.  Pulmonary/Chest: Effort normal and breath sounds normal. He has no wheezes. He has no rales.  Abdominal: Soft. Bowel sounds are normal. There is no rebound.  Musculoskeletal:        General: No edema.  Lymphadenopathy:    He has no cervical adenopathy.  Neurological: He is alert and oriented to person, place, and time. No cranial nerve deficit.  Skin: Skin is warm and dry.  Psychiatric: He has a normal mood and affect.  Nursing note and vitals reviewed.         Assessment & Recommendations:   68 y/o [place] male with hyperlipidemia, COPD, 1) Stage IIA (T1a, N1, M0) non-small cell lung cancer, squamous cell carcinoma s/pVATS guided left upper lobectomy and mediastinal LN dissection 2016,  neoadjuvant systemic chemotherapy with carboplatin  and paclitaxel, now referred for management of coronary artery disease.   CAD: Seen on CT chest. He is asymptomatic at this time. Recommend aggressive medical management. Recommend Aspirin 81 mg daily. Currently on pravastatin 80 mg. Will check lipid panel and reassess lipid lowering therapy. Needs aggressive blood pressure control. Started amlodipine 10 mg daily for now. Will check echocardiogram and AAA screening US duplex.  Tobacco cessation counseling (CPT [phone]):  - Currently smoking 1.5 packs/day   - Patient was informed of the dangers of tobacco abuse including stroke, cancer, and MI, as well as benefits of tobacco cessation. - Patient is willing to quit at this time. - Approximately 5 mins were spent counseling patient cessation techniques. We discussed various methods to help quit smoking, including deciding on a date to quit, joining a support group, pharmacological agents. Nicotine patched has caused itching, he cannot chew nicotine gum. Will start welllbutrin 100 mg bid. - I will reassess his progress at the next follow-up visit    Thank you for referring the patient to [place]. Please feel free to contact with any questions.  Nigel Mormon, MD [facility] Pager: [phone] Office: [phone] If no answer Cell [phone]

## 2019-03-20 ENCOUNTER — Ambulatory Visit (INDEPENDENT_AMBULATORY_CARE_PROVIDER_SITE_OTHER): Payer: Medicare Other

## 2019-03-20 ENCOUNTER — Other Ambulatory Visit: Payer: Self-pay

## 2019-03-20 DIAGNOSIS — Z720 Tobacco use: Secondary | ICD-10-CM | POA: Diagnosis not present

## 2019-03-20 DIAGNOSIS — I714 Abdominal aortic aneurysm, without rupture: Secondary | ICD-10-CM | POA: Diagnosis not present

## 2019-03-20 DIAGNOSIS — I10 Essential (primary) hypertension: Secondary | ICD-10-CM

## 2019-03-22 ENCOUNTER — Other Ambulatory Visit: Payer: Self-pay | Admitting: Cardiology

## 2019-03-22 DIAGNOSIS — Z136 Encounter for screening for cardiovascular disorders: Secondary | ICD-10-CM

## 2019-03-22 NOTE — Progress Notes (Signed)
Patient referred by [name], NP for CAD  Subjective:   Larry [name], male    DOB: 09/18/1950, 68 y.o.   MRN: [id number]   Chief Complaint  Patient presents with  . AAA    follow up  . Hypertension     HPI  68 y/o [place] American male with hyperlipidemia, COPD, 1) Stage IIA (T1a, N1, M0) non-small cell lung cancer, squamous cell carcinoma s/pVATS guided left upper lobectomy and mediastinal LN dissection 2016, neoadjuvant systemic chemotherapy with carboplatin and paclitaxel, coronary artery disease.   Recent CT chest showed aortic atherosclerosis and coronary artery atherosclerosis. Patient lives alone, does not work. He walks about a mile once a week or so. He denies chest pain, shortness of breath, palpitations, leg edema, orthopnea, PND, TIA/syncope. Unfortunately, he continues to smoke 1.5 pack/day.  He has shortness of breath, only when he smokes. He is now on bupropion and trying to quit. Blood pressure is better controlled today. Aorta duplex showed small AAA.  Past Medical History:  Diagnosis Date  . 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 Cataract   . Chronic cough   . COPD (chronic obstructive pulmonary disease) ([place])   . Dyspnea on exertion   . Full dentures   . Hx of adenomatous colonic polyps 06/07/2018  . Hyperlipidemia   . Non-small cell carcinoma of left lung [facility]) oncologist-- dr Julien Nordmann   dx Nov 2015left upper lobe, 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, N1, M0)  s/p  left upper lobectomy with node dissection and neoadjuvant systemic chemotherapy  . Productive cough    intermittantly  . Prostate cancer [facility]) urologist-- dr wrenn/  oncologsit-  dr Tammi Klippel   Stage T1c , Gleason 4+3, PSA 4.1, vol 26.73cc--  External RXT complete and Radiactive seed implants on 04-03-2015  . 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 COLONOSCOPY  12/ 2009  . ENDOBRONCHIAL  ULTRASOUND Bilateral 04/08/2014   Procedure: ENDOBRONCHIAL ULTRASOUND;  Surgeon: Kathee Delton, MD;  Location: [facility];  Service: Cardiopulmonary;  Laterality: Bilateral;  . RADIOACTIVE SEED IMPLANT N/A 04/03/2015   Procedure: RADIOACTIVE SEED IMPLANT/BRACHYTHERAPY IMPLANT;  Surgeon: Irine Seal, MD;  Location: [facility];  Service: Urology;  Laterality: N/A;  . VIDEO ASSISTED THORACOSCOPY (VATS)/ LOBECTOMY Left 09/09/2014   Procedure: LEFT VIDEO ASSISTED THORACOSCOPY (VATS) with LEFT UPPER LOBECTOMY;  Surgeon: Melrose Nakayama, MD;  Location: [place];  Service: Thoracic;  Laterality: Left;  Marland Kitchen VIDEO BRONCHOSCOPY WITH ENDOBRONCHIAL NAVIGATION Right 04/24/2014   Procedure: VIDEO BRONCHOSCOPY WITH ENDOBRONCHIAL NAVIGATION;  Surgeon: Collene Gobble, MD;  Location: [place];  Service: Thoracic;  Laterality: Right;  Marland Kitchen VIDEO BRONCHOSCOPY WITH ENDOBRONCHIAL ULTRASOUND Right 04/24/2014   Procedure: VIDEO BRONCHOSCOPY WITH ENDOBRONCHIAL ULTRASOUND;  Surgeon: Collene Gobble, MD;  Location: [place];  Service: Thoracic;  Laterality: Right;     Social History   Socioeconomic History  . Marital status: Single    Spouse name: Not on file  . Number of children: 0  . Years of education: Not on file  . Highest education level: Not on file  Occupational History  . Occupation: unemployed  Social Needs  . Financial resource strain: Not on file  . Food insecurity    Worry: Not on file    Inability: Not on file  . Transportation needs    Medical: Not on file  Non-medical: Not on file  Tobacco Use  . Smoking status: Current Every Day Smoker    Packs/day: 1.00    Years: 45.00    Pack years: 45.00    Types: Cigarettes  . Smokeless tobacco: Never Used  . Tobacco comment: 1/2 pack per day-- down from from 1 ppd  Substance and Sexual Activity  . Alcohol use: No    Alcohol/week: 0.0 standard drinks  . Drug use: No  . Sexual activity: Not on file  Lifestyle  . Physical activity    Days  per week: Not on file    Minutes per session: Not on file  . Stress: Not on file  Relationships  . Social Herbalist on phone: Not on file    Gets together: Not on file    Attends religious service: Not on file    Active member of club or organization: Not on file    Attends meetings of clubs or organizations: Not on file    Relationship status: Not on file  . Intimate partner violence    Fear of current or ex partner: Not on file    Emotionally abused: Not on file    Physically abused: Not on file    Forced sexual activity: Not on file  Other Topics Concern  . Not on file  Social History Narrative  . Not on file     Family History  Problem Relation Age of Onset  . Heart attack Mother   . Diabetes Mother   . Heart attack Father   . Heart failure Father   . Cancer Brother   . Diabetes Sister   . Colon cancer Neg Hx   . Esophageal cancer Neg Hx   . Rectal cancer Neg Hx   . Stomach cancer Neg Hx      Current Outpatient Medications on File Prior to Visit  Medication Sig Dispense Refill  . albuterol (PROVENTIL HFA;VENTOLIN HFA) 108 (90 Base) MCG/ACT inhaler Inhale 1-2 puffs into the lungs every 6 (six) hours as needed for wheezing or shortness of breath.    Marland Kitchen amLODipine (NORVASC) 10 MG tablet Take 1 tablet (10 mg total) by mouth daily. 30 tablet 3  . aspirin 81 MG chewable tablet Chew 1 tablet by mouth daily.    Marland Kitchen buPROPion (WELLBUTRIN SR) 100 MG 12 hr tablet Take 1 tablet (100 mg total) by mouth 2 (two) times daily. 60 tablet 3  . pravastatin (PRAVACHOL) 80 MG tablet Take 80 mg by mouth every morning.   1  . SYMBICORT 160-4.5 MCG/ACT inhaler INHALE 2 PUFFS INTO THE LUNGS 2 TIMES DAILY 10.2 Inhaler 11   No current facility-administered medications on file prior to visit.     Cardiovascular studies:  Echocardiogram 03/20/2019: Left ventricle cavity is normal in size. Moderate concentric hypertrophy of the left ventricle. Normal LV systolic function with EF  55%. Normal global wall motion. Doppler evidence of grade I (impaired) diastolic dysfunction, normal LAP.  Left atrial cavity is normal in size. Aneurysmal interatrial septum without 2D or color Doppler evidence of interatrial shunt. Mild tricuspid regurgitation. Estimated pulmonary artery systolic pressure is 24 mmHg.  Abdominal Aortic Duplex  03/20/2019: The maximum aorta (sac) diameter is 2.79 cm (dist). Moderate plaque observed in the distal aorta. Mild plaque observed in the proximal and mid aorta. Normal flow velocities noted in the abdominal aorta and internal iliac arteries bilaterally.  An abdominal aortic aneurysm measuring 2.71 x 2.73 x 2.79 cm is seen in  the distal aorta. Recheck in 5 years for stability.   EKG 03/02/2019: Sinus rhythm 80 bpm. Old anteroseptal infarct.   CT chest 11/30/2018: 1. Status post left upper lobectomy, without recurrent or metastatic disease. 2. Mixed attenuation right middle lobe pulmonary nodule is similar to on the prior exam. Recommend attention on follow-up. 3. Other areas of pulmonary nodularity are favored to represent foci of scarring, and are not significantly changed. 4. Aortic atherosclerosis (ICD10-I70.0), coronary artery atherosclerosis and emphysema (ICD10-J43.9).  Recent labs: Results for JHAIR, WITHERINGTON (MRN [id number]) as of 03/02/2019 09:30  Ref. Range 11/30/2018 08:06  Sodium Latest Ref Range: 135 - 145 mmol/L 136  Potassium Latest Ref Range: 3.5 - 5.1 mmol/L 3.7  Chloride Latest Ref Range: 98 - 111 mmol/L 104  CO2 Latest Ref Range: 22 - 32 mmol/L 21 (L)  Glucose Latest Ref Range: 70 - 99 mg/dL 128 (H)  BUN Latest Ref Range: 8 - 23 mg/dL 9  Creatinine Latest Ref Range: 0.61 - 1.24 mg/dL 0.91  Calcium Latest Ref Range: 8.9 - 10.3 mg/dL 9.0  Anion gap Latest Ref Range: 5 - 15  11  Alkaline Phosphatase Latest Ref Range: 38 - 126 U/L 78  Albumin Latest Ref Range: 3.5 - 5.0 g/dL 3.6  AST Latest Ref Range: 15 - 41 U/L 18  ALT Latest  Ref Range: 0 - 44 U/L 27  Total Protein Latest Ref Range: 6.5 - 8.1 g/dL 7.1  Total Bilirubin Latest Ref Range: 0.3 - 1.2 mg/dL <0.2 (L)  GFR, Est Non African American Latest Ref Range: >60 mL/min >60  GFR, Est African American Latest Ref Range: >60 mL/min >60   Results for DEMON, VOLANTE (MRN [id number]) as of 03/02/2019 09:30  Ref. Range 11/30/2018 08:06  WBC Latest Ref Range: 4.0 - 10.5 K/uL 9.5  RBC Latest Ref Range: 4.22 - 5.81 MIL/uL 4.65  Hemoglobin Latest Ref Range: 13.0 - 17.0 g/dL 13.1  HCT Latest Ref Range: 39.0 - 52.0 % 38.1 (L)  MCV Latest Ref Range: 80.0 - 100.0 fL 81.9  MCH Latest Ref Range: 26.0 - 34.0 pg 28.2  MCHC Latest Ref Range: 30.0 - 36.0 g/dL 34.4  RDW Latest Ref Range: 11.5 - 15.5 % 13.8  Platelets Latest Ref Range: 150 - 400 K/uL 173  nRBC Latest Ref Range: 0.0 - 0.2 % 0.0    Review of Systems  Constitution: Negative for decreased appetite, malaise/fatigue, weight gain and weight loss.  HENT: Negative for congestion.   Eyes: Negative for visual disturbance.  Cardiovascular: Negative for chest pain, dyspnea on exertion, leg swelling, palpitations and syncope.  Respiratory: Positive for shortness of breath (Only when he smokes). Negative for cough.   Endocrine: Negative for cold intolerance.  Hematologic/Lymphatic: Does not bruise/bleed easily.  Skin: Negative for itching and rash.  Musculoskeletal: Negative for myalgias.  Gastrointestinal: Negative for abdominal pain, nausea and vomiting.  Genitourinary: Negative for dysuria.  Neurological: Negative for dizziness and weakness.  Psychiatric/Behavioral: The patient is not nervous/anxious.   All other systems reviewed and are negative.       Vitals:   03/30/19 0921  BP: 133/83  Pulse: 87  Temp: 97.8 F (36.6 C)  SpO2: 98%     Body mass index is 27.12 kg/m. Filed Weights   03/30/19 0921  Weight: 200 lb (90.7 kg)     Objective:   Physical Exam  Constitutional: He is oriented to person,  place, and time. He appears well-developed and well-nourished. No distress.  HENT:  Head: Normocephalic and atraumatic.  Eyes: Pupils are equal, round, and reactive to light. Conjunctivae are normal.  Neck: No JVD present.  Cardiovascular: Normal rate and regular rhythm.  No murmur heard. Pulses:      Femoral pulses are 2+ on the right side and 2+ on the left side.      Dorsalis pedis pulses are 1+ on the right side and 1+ on the left side.       Posterior tibial pulses are 0 on the right side and 0 on the left side.  Pulmonary/Chest: Effort normal and breath sounds normal. He has no wheezes. He has no rales.  Abdominal: Soft. Bowel sounds are normal. There is no rebound.  Musculoskeletal:        General: No edema.  Lymphadenopathy:    He has no cervical adenopathy.  Neurological: He is alert and oriented to person, place, and time. No cranial nerve deficit.  Skin: Skin is warm and dry.  Psychiatric: He has a normal mood and affect.  Nursing note and vitals reviewed.         Assessment & Recommendations:   68 y/o [place] American male with hyperlipidemia, COPD, 1) Stage IIA (T1a, N1, M0) non-small cell lung cancer, squamous cell carcinoma s/pVATS guided left upper lobectomy and mediastinal LN dissection 2016, neoadjuvant systemic chemotherapy with carboplatin and paclitaxel, now referred for management of coronary artery disease.   CAD: Seen on CT chest. He is asymptomatic at this time. Recommend aggressive medical management. Recommend Aspirin 81 mg daily. Currently on pravastatin 80 mg. Will check lipid panel and reassess lipid lowering therapy. Needs aggressive blood pressure control. Started amlodipine 10 mg daily for now. Will check echocardiogram and AAA screening US duplex.  Tobacco cessation counseling (CPT [phone]):  - Currently smoking 1.5 packs/day   - Patient was informed of the dangers of tobacco abuse including stroke, cancer, and MI, as well as benefits of tobacco  cessation. - Patient is willing to quit at this time. - Approximately 5 mins were spent counseling patient cessation techniques. We discussed various methods to help quit smoking, including deciding on a date to quit, joining a support group, pharmacological agents. Nicotine patched has caused itching, he cannot chew nicotine gum. Will start welllbutrin 100 mg bid. - I will reassess his progress at the next follow-up visit    Thank you for referring the patient to [place]. Please feel free to contact with any questions.  Nigel Mormon, MD [facility] [place] Pager: [phone] Office: [phone] If no answer Cell [phone]     Patient referred by [name], NP for CAD  Subjective:   Larry [name], male    DOB: 1951-04-06, 68 y.o.   MRN: [id number]   No chief complaint on file.    HPI  68 y/o [place] American male with hyperlipidemia, COPD, h/o Stage IIA (T1a, N1, M0) non-small cell lung cancer, squamous cell carcinoma s/pVATS guided left upper lobectomy and mediastinal LN dissection 2016, neoadjuvant systemic chemotherapy with carboplatin and paclitaxel, now referred for management of coronary artery disease.   Recent CT chest showed aortic atherosclerosis and coronary artery atherosclerosis. Patient lives alone, does not work. He walks about a mile once a week or so. He denies chest pain, shortness of breath, palpitations, leg edema, orthopnea, PND, TIA/syncope. Unfortunately, he continues to smoke 1.5 pack/day.   Past Medical History:  Diagnosis Date  . 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 Cataract   . Chronic cough   . COPD (chronic  obstructive pulmonary disease) ([place])   . Dyspnea on exertion   . Full dentures   . Hx of adenomatous colonic polyps 06/07/2018  . Hyperlipidemia   . Non-small cell carcinoma of left lung [facility]) oncologist-- dr Julien Nordmann   dx Nov 2015left upper lobe, 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, N1, M0)  s/p  left upper  lobectomy with node dissection and neoadjuvant systemic chemotherapy  . Productive cough    intermittantly  . Prostate cancer [facility]) urologist-- dr wrenn/  oncologsit-  dr Tammi Klippel   Stage T1c , Gleason 4+3, PSA 4.1, vol 26.73cc--  External RXT complete and Radiactive seed implants on 04-03-2015  . Stage II squamous cell carcinoma of left lung ([place]) 05/22/2014         Past Surgical History:  Procedure Laterality Date  . COLONOSCOPY  12/ 2009  . ENDOBRONCHIAL ULTRASOUND Bilateral 04/08/2014   Procedure: ENDOBRONCHIAL ULTRASOUND;  Surgeon: Kathee Delton, MD;  Location: [facility];  Service: Cardiopulmonary;  Laterality: Bilateral;  . RADIOACTIVE SEED IMPLANT N/A 04/03/2015   Procedure: RADIOACTIVE SEED IMPLANT/BRACHYTHERAPY IMPLANT;  Surgeon: Irine Seal, MD;  Location: [facility];  Service: Urology;  Laterality: N/A;  . VIDEO ASSISTED THORACOSCOPY (VATS)/ LOBECTOMY Left 09/09/2014   Procedure: LEFT VIDEO ASSISTED THORACOSCOPY (VATS) with LEFT UPPER LOBECTOMY;  Surgeon: Melrose Nakayama, MD;  Location: [place];  Service: Thoracic;  Laterality: Left;  Marland Kitchen VIDEO BRONCHOSCOPY WITH ENDOBRONCHIAL NAVIGATION Right 04/24/2014   Procedure: VIDEO BRONCHOSCOPY WITH ENDOBRONCHIAL NAVIGATION;  Surgeon: Collene Gobble, MD;  Location: [place];  Service: Thoracic;  Laterality: Right;  Marland Kitchen VIDEO BRONCHOSCOPY WITH ENDOBRONCHIAL ULTRASOUND Right 04/24/2014   Procedure: VIDEO BRONCHOSCOPY WITH ENDOBRONCHIAL ULTRASOUND;  Surgeon: Collene Gobble, MD;  Location: [place];  Service: Thoracic;  Laterality: Right;     Social History   Socioeconomic History  . Marital status: Single    Spouse name: Not on file  . Number of children: 0  . Years of education: Not on file  . Highest education level: Not on file  Occupational History  . Occupation: unemployed  Social Needs  . Financial resource strain: Not on file  . Food insecurity    Worry: Not on file    Inability: Not on file  . Transportation  needs    Medical: Not on file    Non-medical: Not on file  Tobacco Use  . Smoking status: Current Every Day Smoker    Packs/day: 1.00    Years: 45.00    Pack years: 45.00    Types: Cigarettes  . Smokeless tobacco: Never Used  . Tobacco comment: 1/2 pack per day-- down from from 1 ppd  Substance and Sexual Activity  . Alcohol use: No    Alcohol/week: 0.0 standard drinks  . Drug use: No  . Sexual activity: Not on file  Lifestyle  . Physical activity    Days per week: Not on file    Minutes per session: Not on file  . Stress: Not on file  Relationships  . Social Herbalist on phone: Not on file    Gets together: Not on file    Attends religious service: Not on file    Active member of club or organization: Not on file    Attends meetings of clubs or organizations: Not on file    Relationship status: Not on file  . Intimate partner violence    Fear of current or ex partner: Not on file  Emotionally abused: Not on file    Physically abused: Not on file    Forced sexual activity: Not on file  Other Topics Concern  . Not on file  Social History Narrative  . Not on file     Family History  Problem Relation Age of Onset  . Heart attack Mother   . Diabetes Mother   . Heart attack Father   . Heart failure Father   . Cancer Brother   . Diabetes Sister   . Colon cancer Neg Hx   . Esophageal cancer Neg Hx   . Rectal cancer Neg Hx   . Stomach cancer Neg Hx      Current Outpatient Medications on File Prior to Visit  Medication Sig Dispense Refill  . albuterol (PROVENTIL HFA;VENTOLIN HFA) 108 (90 Base) MCG/ACT inhaler Inhale 1-2 puffs into the lungs every 6 (six) hours as needed for wheezing or shortness of breath.    Marland Kitchen amLODipine (NORVASC) 10 MG tablet Take 1 tablet (10 mg total) by mouth daily. 30 tablet 3  . aspirin 81 MG chewable tablet Chew 1 tablet by mouth daily.    Marland Kitchen buPROPion (WELLBUTRIN SR) 100 MG 12 hr tablet Take 1 tablet (100 mg total) by mouth 2  (two) times daily. 60 tablet 3  . pravastatin (PRAVACHOL) 80 MG tablet Take 80 mg by mouth every morning.   1  . SYMBICORT 160-4.5 MCG/ACT inhaler INHALE 2 PUFFS INTO THE LUNGS 2 TIMES DAILY 10.2 Inhaler 11   No current facility-administered medications on file prior to visit.     Cardiovascular studies:  Abdominal Aortic Duplex  03/20/2019: The maximum aorta (sac) diameter is 2.79 cm (dist). Moderate plaque observed in the distal aorta. Mild plaque observed in the proximal and mid aorta. Normal flow velocities noted in the abdominal aorta and internal iliac arteries bilaterally.  An abdominal aortic aneurysm measuring 2.71 x 2.73 x 2.79 cm is seen in the distal aorta. Recheck in 5 years for stability.   EKG 03/02/2019: Sinus rhythm 80 bpm. Old anteroseptal infarct.   CT chest 11/30/2018: 1. Status post left upper lobectomy, without recurrent or metastatic disease. 2. Mixed attenuation right middle lobe pulmonary nodule is similar to on the prior exam. Recommend attention on follow-up. 3. Other areas of pulmonary nodularity are favored to represent foci of scarring, and are not significantly changed. 4. Aortic atherosclerosis (ICD10-I70.0), coronary artery atherosclerosis and emphysema (ICD10-J43.9).  Recent labs: Results for AMEIR, FARIA (MRN [id number]) as of 03/02/2019 09:30  Ref. Range 11/30/2018 08:06  Sodium Latest Ref Range: 135 - 145 mmol/L 136  Potassium Latest Ref Range: 3.5 - 5.1 mmol/L 3.7  Chloride Latest Ref Range: 98 - 111 mmol/L 104  CO2 Latest Ref Range: 22 - 32 mmol/L 21 (L)  Glucose Latest Ref Range: 70 - 99 mg/dL 128 (H)  BUN Latest Ref Range: 8 - 23 mg/dL 9  Creatinine Latest Ref Range: 0.61 - 1.24 mg/dL 0.91  Calcium Latest Ref Range: 8.9 - 10.3 mg/dL 9.0  Anion gap Latest Ref Range: 5 - 15  11  Alkaline Phosphatase Latest Ref Range: 38 - 126 U/L 78  Albumin Latest Ref Range: 3.5 - 5.0 g/dL 3.6  AST Latest Ref Range: 15 - 41 U/L 18  ALT Latest Ref Range: 0  - 44 U/L 27  Total Protein Latest Ref Range: 6.5 - 8.1 g/dL 7.1  Total Bilirubin Latest Ref Range: 0.3 - 1.2 mg/dL <0.2 (L)  GFR, Est Non African American Latest Ref  Range: >60 mL/min >60  GFR, Est African American Latest Ref Range: >60 mL/min >60   Results for FERDINAND, REVOIR (MRN [id number]) as of 03/02/2019 09:30  Ref. Range 11/30/2018 08:06  WBC Latest Ref Range: 4.0 - 10.5 K/uL 9.5  RBC Latest Ref Range: 4.22 - 5.81 MIL/uL 4.65  Hemoglobin Latest Ref Range: 13.0 - 17.0 g/dL 13.1  HCT Latest Ref Range: 39.0 - 52.0 % 38.1 (L)  MCV Latest Ref Range: 80.0 - 100.0 fL 81.9  MCH Latest Ref Range: 26.0 - 34.0 pg 28.2  MCHC Latest Ref Range: 30.0 - 36.0 g/dL 34.4  RDW Latest Ref Range: 11.5 - 15.5 % 13.8  Platelets Latest Ref Range: 150 - 400 K/uL 173  nRBC Latest Ref Range: 0.0 - 0.2 % 0.0    Review of Systems  Constitution: Negative for decreased appetite, malaise/fatigue, weight gain and weight loss.  HENT: Negative for congestion.   Eyes: Negative for visual disturbance.  Cardiovascular: Negative for chest pain, dyspnea on exertion, leg swelling, palpitations and syncope.  Respiratory: Positive for shortness of breath (Only when he smokes). Negative for cough.   Endocrine: Negative for cold intolerance.  Hematologic/Lymphatic: Does not bruise/bleed easily.  Skin: Negative for itching and rash.  Musculoskeletal: Negative for myalgias.  Gastrointestinal: Negative for abdominal pain, nausea and vomiting.  Genitourinary: Negative for dysuria.  Neurological: Negative for dizziness and weakness.  Psychiatric/Behavioral: The patient is not nervous/anxious.   All other systems reviewed and are negative.       Vitals:   03/30/19 0921  BP: 133/83  Pulse: 87  Temp: 97.8 F (36.6 C)  SpO2: 98%     Body mass index is 27.12 kg/m. Filed Weights   03/30/19 0921  Weight: 200 lb (90.7 kg)     Objective:   Physical Exam  Constitutional: He is oriented to person, place, and time.  He appears well-developed and well-nourished. No distress.  HENT:  Head: Normocephalic and atraumatic.  Eyes: Pupils are equal, round, and reactive to light. Conjunctivae are normal.  Neck: No JVD present.  Cardiovascular: Normal rate and regular rhythm.  No murmur heard. Pulses:      Femoral pulses are 2+ on the right side and 2+ on the left side.      Dorsalis pedis pulses are 1+ on the right side and 1+ on the left side.       Posterior tibial pulses are 0 on the right side and 0 on the left side.  Pulmonary/Chest: Effort normal and breath sounds normal. He has no wheezes. He has no rales.  Abdominal: Soft. Bowel sounds are normal. There is no rebound.  Musculoskeletal:        General: No edema.  Lymphadenopathy:    He has no cervical adenopathy.  Neurological: He is alert and oriented to person, place, and time. No cranial nerve deficit.  Skin: Skin is warm and dry.  Psychiatric: He has a normal mood and affect.  Nursing note and vitals reviewed.         Assessment & Recommendations:   68 y/o [place] American male with hyperlipidemia, COPD, 1) Stage IIA (T1a, N1, M0) non-small cell lung cancer, squamous cell carcinoma s/pVATS guided left upper lobectomy and mediastinal LN dissection 2016, neoadjuvant systemic chemotherapy with carboplatin and paclitaxel, coronary artery disease.   CAD: Seen on CT chest. He is asymptomatic at this time. Recommend aggressive medical management. Continue Aspirin 81 mg daily. Currently on pravastatin 80 mg. Recommended to check lipid panel and  reassess lipid lowering therapy. However, he has not checked lipid panel. Continue pravastatin for now.  Hypertension: Continue amlodipine 10 mg daily for now.   AAA: <3 cm. Repeat aorta duplex in 5 years.  Tobacco cessation counseling: Re-emphasized importance of cessation. Continue bupropion. Tolerating well.  F/u in 6 months.  Nigel Mormon, MD [facility] [place] Pager:  [phone] Office: [phone] If no answer Cell [phone]

## 2019-03-24 ENCOUNTER — Other Ambulatory Visit: Payer: Self-pay | Admitting: Cardiology

## 2019-03-24 DIAGNOSIS — Z720 Tobacco use: Secondary | ICD-10-CM

## 2019-03-30 ENCOUNTER — Other Ambulatory Visit (HOSPITAL_COMMUNITY): Payer: Self-pay | Admitting: Cardiology

## 2019-03-30 ENCOUNTER — Ambulatory Visit (INDEPENDENT_AMBULATORY_CARE_PROVIDER_SITE_OTHER): Payer: Medicare Other | Admitting: Cardiology

## 2019-03-30 ENCOUNTER — Other Ambulatory Visit: Payer: Self-pay

## 2019-03-30 ENCOUNTER — Encounter: Payer: Self-pay | Admitting: Cardiology

## 2019-03-30 VITALS — BP 133/83 | HR 87 | Temp 97.8°F | Ht 72.0 in | Wt 200.0 lb

## 2019-03-30 DIAGNOSIS — E782 Mixed hyperlipidemia: Secondary | ICD-10-CM

## 2019-03-30 DIAGNOSIS — I714 Abdominal aortic aneurysm, without rupture, unspecified: Secondary | ICD-10-CM | POA: Insufficient documentation

## 2019-03-30 DIAGNOSIS — Z720 Tobacco use: Secondary | ICD-10-CM

## 2019-03-30 DIAGNOSIS — I2510 Atherosclerotic heart disease of native coronary artery without angina pectoris: Secondary | ICD-10-CM

## 2019-03-30 DIAGNOSIS — I10 Essential (primary) hypertension: Secondary | ICD-10-CM

## 2019-03-30 DIAGNOSIS — I700 Atherosclerosis of aorta: Secondary | ICD-10-CM | POA: Diagnosis not present

## 2019-03-31 LAB — LIPID PANEL
Chol/HDL Ratio: 4.6 ratio (ref 0.0–5.0)
Cholesterol, Total: 215 mg/dL — ABNORMAL HIGH (ref 100–199)
HDL: 47 mg/dL (ref >39)
LDL Chol Calc (NIH): 142 mg/dL — ABNORMAL HIGH (ref 0–99)
Triglycerides: 144 mg/dL (ref 0–149)
VLDL Cholesterol Cal: 26 mg/dL (ref 5–40)

## 2019-04-02 DIAGNOSIS — E782 Mixed hyperlipidemia: Secondary | ICD-10-CM | POA: Insufficient documentation

## 2019-04-02 MED ORDER — ROSUVASTATIN CALCIUM 40 MG PO TABS: 40.0000 mg | ORAL_TABLET | Freq: Every day | ORAL | 5 refills | Status: AC

## 2019-04-02 NOTE — Progress Notes (Signed)
Reviewed lipid panel. LDL 142. Stop pravastatin. Start rosuvastatin 40 mg daily. Repeat lipid panel in 3 months.

## 2019-04-02 NOTE — Addendum Note (Signed)
Addended by: Nigel Mormon on: 04/02/2019 03:36 PM   Modules accepted: Orders

## 2019-04-03 ENCOUNTER — Other Ambulatory Visit: Payer: Self-pay

## 2019-04-03 NOTE — Progress Notes (Signed)
LVM with details; will call in rosuvastatin

## 2019-05-28 ENCOUNTER — Other Ambulatory Visit: Payer: Self-pay | Admitting: Cardiology

## 2019-05-28 DIAGNOSIS — I10 Essential (primary) hypertension: Secondary | ICD-10-CM

## 2019-06-04 ENCOUNTER — Other Ambulatory Visit: Payer: Self-pay

## 2019-06-04 ENCOUNTER — Encounter (HOSPITAL_COMMUNITY): Payer: Self-pay

## 2019-06-04 ENCOUNTER — Ambulatory Visit (HOSPITAL_COMMUNITY)
Admission: RE | Admit: 2019-06-04 | Discharge: 2019-06-04 | Disposition: A | Discharge status: Home / Self Care | Payer: Medicare Other | Source: Ambulatory Visit | Attending: Internal Medicine | Admitting: Internal Medicine

## 2019-06-04 ENCOUNTER — Inpatient Hospital Stay: Payer: Medicare Other | Attending: Internal Medicine

## 2019-06-04 DIAGNOSIS — I10 Essential (primary) hypertension: Secondary | ICD-10-CM | POA: Diagnosis not present

## 2019-06-04 DIAGNOSIS — C3490 Malignant neoplasm of unspecified part of unspecified bronchus or lung: Secondary | ICD-10-CM | POA: Insufficient documentation

## 2019-06-04 DIAGNOSIS — Z923 Personal history of irradiation: Secondary | ICD-10-CM | POA: Diagnosis not present

## 2019-06-04 DIAGNOSIS — Z8546 Personal history of malignant neoplasm of prostate: Secondary | ICD-10-CM | POA: Insufficient documentation

## 2019-06-04 DIAGNOSIS — F17210 Nicotine dependence, cigarettes, uncomplicated: Secondary | ICD-10-CM | POA: Insufficient documentation

## 2019-06-04 DIAGNOSIS — Z79899 Other long term (current) drug therapy: Secondary | ICD-10-CM | POA: Diagnosis not present

## 2019-06-04 LAB — CBC WITH DIFFERENTIAL (CANCER CENTER ONLY)
Abs Immature Granulocytes: 0.06 10*3/uL (ref 0.00–0.07)
Basophils Absolute: 0.1 10*3/uL (ref 0.0–0.1)
Basophils Relative: 1 %
Eosinophils Absolute: 0.3 10*3/uL (ref 0.0–0.5)
Eosinophils Relative: 3 %
HCT: 31.2 % — ABNORMAL LOW (ref 39.0–52.0)
Hemoglobin: 10.8 g/dL — ABNORMAL LOW (ref 13.0–17.0)
Immature Granulocytes: 1 %
Lymphocytes Relative: 20 %
Lymphs Abs: 2.5 10*3/uL (ref 0.7–4.0)
MCH: 27.6 pg (ref 26.0–34.0)
MCHC: 34.6 g/dL (ref 30.0–36.0)
MCV: 79.8 fL — ABNORMAL LOW (ref 80.0–100.0)
Monocytes Absolute: 1.4 10*3/uL — ABNORMAL HIGH (ref 0.1–1.0)
Monocytes Relative: 11 %
Neutro Abs: 8.2 10*3/uL — ABNORMAL HIGH (ref 1.7–7.7)
Neutrophils Relative %: 64 %
Platelet Count: 262 10*3/uL (ref 150–400)
RBC: 3.91 MIL/uL — ABNORMAL LOW (ref 4.22–5.81)
RDW: 16.5 % — ABNORMAL HIGH (ref 11.5–15.5)
WBC Count: 12.5 10*3/uL — ABNORMAL HIGH (ref 4.0–10.5)
nRBC: 0 % (ref 0.0–0.2)

## 2019-06-04 LAB — CMP (CANCER CENTER ONLY)
ALT: 25 U/L (ref 0–44)
AST: 22 U/L (ref 15–41)
Albumin: 4 g/dL (ref 3.5–5.0)
Alkaline Phosphatase: 78 U/L (ref 38–126)
Anion gap: 12 (ref 5–15)
BUN: 16 mg/dL (ref 8–23)
CO2: 22 mmol/L (ref 22–32)
Calcium: 9.3 mg/dL (ref 8.9–10.3)
Chloride: 105 mmol/L (ref 98–111)
Creatinine: 0.86 mg/dL (ref 0.61–1.24)
GFR, Est AFR Am: >60 mL/min (ref >60)
GFR, Estimated: >60 mL/min (ref >60)
Glucose, Bld: 130 mg/dL — ABNORMAL HIGH (ref 70–99)
Potassium: 4 mmol/L (ref 3.5–5.1)
Sodium: 139 mmol/L (ref 135–145)
Total Bilirubin: 0.2 mg/dL — ABNORMAL LOW (ref 0.3–1.2)
Total Protein: 7.7 g/dL (ref 6.5–8.1)

## 2019-06-04 MED ORDER — IOHEXOL 300 MG/ML  SOLN
75.0000 mL | Freq: Once | INTRAMUSCULAR | Status: AC | PRN
  Administered 2019-06-04: 75 mL via INTRAVENOUS

## 2019-06-04 MED ORDER — SODIUM CHLORIDE (PF) 0.9 % IJ SOLN
INTRAMUSCULAR | Status: AC
  Filled 2019-06-04: qty 50

## 2019-06-06 ENCOUNTER — Other Ambulatory Visit: Payer: Self-pay

## 2019-06-06 ENCOUNTER — Encounter: Payer: Self-pay | Admitting: Internal Medicine

## 2019-06-06 ENCOUNTER — Telehealth: Payer: Self-pay | Admitting: Internal Medicine

## 2019-06-06 ENCOUNTER — Inpatient Hospital Stay (HOSPITAL_BASED_OUTPATIENT_CLINIC_OR_DEPARTMENT_OTHER): Payer: Medicare Other | Admitting: Internal Medicine

## 2019-06-06 VITALS — BP 146/68 | HR 93 | Temp 97.3°F | Resp 20 | Ht 72.0 in | Wt 202.3 lb

## 2019-06-06 DIAGNOSIS — I10 Essential (primary) hypertension: Secondary | ICD-10-CM

## 2019-06-06 DIAGNOSIS — C3492 Malignant neoplasm of unspecified part of left bronchus or lung: Secondary | ICD-10-CM | POA: Diagnosis not present

## 2019-06-06 DIAGNOSIS — J449 Chronic obstructive pulmonary disease, unspecified: Secondary | ICD-10-CM

## 2019-06-06 DIAGNOSIS — C3490 Malignant neoplasm of unspecified part of unspecified bronchus or lung: Secondary | ICD-10-CM | POA: Diagnosis not present

## 2019-06-06 DIAGNOSIS — Z720 Tobacco use: Secondary | ICD-10-CM

## 2019-06-06 NOTE — Progress Notes (Signed)
Oncology Nurse Navigator Documentation  Oncology Nurse Navigator Flowsheets 06/06/2019  Navigator Location [facility]  Navigator Encounter Type Clinic/MDC  Patient Visit Type Follow-up;MedOnc  Treatment Phase Post-Tx Follow-up  Barriers/Navigation Needs Coordination of Care;Education  Education Smoking cessation;Other/I spoke with patient today regarding his plan of care and smoking cessation.  I gave written information on PET scan and smoking cessation.  I also gave him tips to help him quit.  I asked that he call me if needed to further help him to quit smoking.    Interventions Coordination of Care;Education  Acuity Level 2-Minimal Needs (1-2 Barriers Identified)  Coordination of Care Other  Education Method Verbal;Written  Time Spent with Patient 15  Time Spent with Patient (Retired) -

## 2019-06-06 NOTE — Telephone Encounter (Signed)
Scheduled appt per 1/6 los - gave patient AVS and calender per los.

## 2019-06-06 NOTE — Progress Notes (Signed)
[facility] [phone]   Fax:([phone]) [phone]  OFFICE PROGRESS NOTE  [name], NP [place] [address]  DIAGNOSIS:  1) Stage IIA (T1a, N1, M0) non-small cell lung cancer, squamous cell carcinoma diagnosed in November 2015, presented with left suprahilar soft tissue mass. 2) history of prostate adenocarcinoma  PRIOR THERAPY:  1) Neoadjuvant systemic chemotherapy with carboplatin for AUC of 5 and paclitaxel 175 MG/M2 every 3 weeks with Neulasta support. First dose 06/13/2014. Status post 3 cycle. 2) Status post Left video-assisted thoracoscopy, Thoracoscopic left upper lobectomy, Mediastinal lymph node dissection under the care of Dr. Roxan Hockey on 09/09/2014.  CURRENT THERAPY: Observation.  INTERVAL HISTORY: Larry [name] 69 y.o. male returns to the clinic today for follow-up visit.  The patient is feeling fine today with no concerning complaints except for the shortness of breath with exertion.  He also has mild smoking cough with no chest pain or hemoptysis.  He continues to smoke around 1 pack of cigarette every day.  The patient denied having any recent weight loss or night sweats.  He has no nausea, vomiting, diarrhea or constipation.  He has no headache or visual changes.  He had repeat CT scan of the chest performed recently and he is here for evaluation and discussion of his scan results.  MEDICAL HISTORY: Past Medical History:  Diagnosis Date  . 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 Cataract   . Chronic cough   . COPD (chronic obstructive pulmonary disease) ([place])   . Dyspnea on exertion   . Full dentures   . Hx of adenomatous colonic polyps 06/07/2018  . Hyperlipidemia   . Non-small cell carcinoma of left lung [facility]) oncologist-- dr Julien Nordmann   dx Nov 2015left upper lobe, 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, N1, M0)  s/p  left upper lobectomy with node dissection and neoadjuvant systemic chemotherapy  .  Productive cough    intermittantly  . Prostate cancer [facility]) urologist-- dr wrenn/  oncologsit-  dr Tammi Klippel   Stage T1c , Gleason 4+3, PSA 4.1, vol 26.73cc--  External RXT complete and Radiactive seed implants on 04-03-2015  . Stage II squamous cell carcinoma of left lung ([place]) 05/22/2014        ALLERGIES:  has No Known Allergies.  MEDICATIONS:  Current Outpatient Medications  Medication Sig Dispense Refill  . albuterol (PROVENTIL HFA;VENTOLIN HFA) 108 (90 Base) MCG/ACT inhaler Inhale 1-2 puffs into the lungs every 6 (six) hours as needed for wheezing or shortness of breath.    Marland Kitchen amLODipine (NORVASC) 10 MG tablet TAKE 1 TABLET BY MOUTH EVERY DAY 90 tablet 1  . aspirin 81 MG chewable tablet Chew 1 tablet by mouth daily.    Marland Kitchen buPROPion (WELLBUTRIN SR) 100 MG 12 hr tablet TAKE 1 TABLET BY MOUTH TWICE A DAY 180 tablet 2  . rosuvastatin (CRESTOR) 40 MG tablet Take 1 tablet (40 mg total) by mouth daily. 30 tablet 5  . SYMBICORT 160-4.5 MCG/ACT inhaler INHALE 2 PUFFS INTO THE LUNGS 2 TIMES DAILY 10.2 Inhaler 11   No current facility-administered medications for this visit.    SURGICAL HISTORY:  Past Surgical History:  Procedure Laterality Date  . COLONOSCOPY  12/ 2009  . ENDOBRONCHIAL ULTRASOUND Bilateral 04/08/2014   Procedure: ENDOBRONCHIAL ULTRASOUND;  Surgeon: Kathee Delton, MD;  Location: [facility];  Service: Cardiopulmonary;  Laterality: Bilateral;  . RADIOACTIVE SEED IMPLANT N/A 04/03/2015   Procedure: RADIOACTIVE SEED IMPLANT/BRACHYTHERAPY IMPLANT;  Surgeon: Jenny Reichmann  Jeffie Pollock, MD;  Location: [facility];  Service: Urology;  Laterality: N/A;  . VIDEO ASSISTED THORACOSCOPY (VATS)/ LOBECTOMY Left 09/09/2014   Procedure: LEFT VIDEO ASSISTED THORACOSCOPY (VATS) with LEFT UPPER LOBECTOMY;  Surgeon: Melrose Nakayama, MD;  Location: [facility];  Service: Thoracic;  Laterality: Left;  Marland Kitchen VIDEO BRONCHOSCOPY WITH ENDOBRONCHIAL NAVIGATION Right 04/24/2014   Procedure: VIDEO BRONCHOSCOPY  WITH ENDOBRONCHIAL NAVIGATION;  Surgeon: Collene Gobble, MD;  Location: [facility];  Service: Thoracic;  Laterality: Right;  Marland Kitchen VIDEO BRONCHOSCOPY WITH ENDOBRONCHIAL ULTRASOUND Right 04/24/2014   Procedure: VIDEO BRONCHOSCOPY WITH ENDOBRONCHIAL ULTRASOUND;  Surgeon: Collene Gobble, MD;  Location: [facility];  Service: Thoracic;  Laterality: Right;    REVIEW OF SYSTEMS:  Constitutional: positive for fatigue Eyes: negative Ears, nose, mouth, throat, and face: negative Respiratory: positive for cough and dyspnea on exertion Cardiovascular: negative Gastrointestinal: negative Genitourinary:negative Integument/breast: negative Hematologic/lymphatic: negative Musculoskeletal:negative Neurological: negative Behavioral/Psych: negative Endocrine: negative Allergic/Immunologic: negative   PHYSICAL EXAMINATION: General appearance: alert, cooperative, fatigued and no distress Head: Normocephalic, without obvious abnormality, atraumatic Neck: no adenopathy, no JVD, supple, symmetrical, trachea midline and thyroid not enlarged, symmetric, no tenderness/mass/nodules Lymph nodes: Cervical, supraclavicular, and axillary nodes normal. Resp: wheezes bilaterally Back: symmetric, no curvature. ROM normal. No CVA tenderness. Cardio: regular rate and rhythm, S1, S2 normal, no murmur, click, rub or gallop GI: soft, non-tender; bowel sounds normal; no masses,  no organomegaly Extremities: extremities normal, atraumatic, no cyanosis or edema Neurologic: Alert and oriented X 3, normal strength and tone. Normal symmetric reflexes. Normal coordination and gait  ECOG PERFORMANCE STATUS: 1 - Symptomatic but completely ambulatory  Blood pressure (!) 146/68, pulse 93, temperature (!) 97.3 F (36.3 C), temperature source Oral, resp. rate 20, height 6' (1.829 m), weight 202 lb 4.8 oz (91.8 kg), SpO2 98 %.  LABORATORY DATA: Lab Results  Component Value Date   WBC 12.5 (H) 06/04/2019   HGB 10.8 (L) 06/04/2019   HCT 31.2  (L) 06/04/2019   MCV 79.8 (L) 06/04/2019   PLT 262 06/04/2019      Chemistry      Component Value Date/Time   NA 139 06/04/2019 0744   NA 139 05/25/2017 0734   K 4.0 06/04/2019 0744   K 3.7 05/25/2017 0734   CL 105 06/04/2019 0744   CO2 22 06/04/2019 0744   CO2 23 05/25/2017 0734   BUN 16 06/04/2019 0744   BUN 10.2 05/25/2017 0734   CREATININE 0.86 06/04/2019 0744   CREATININE 0.7 05/25/2017 0734      Component Value Date/Time   CALCIUM 9.3 06/04/2019 0744   CALCIUM 9.5 05/25/2017 0734   ALKPHOS 78 06/04/2019 0744   ALKPHOS 80 05/25/2017 0734   AST 22 06/04/2019 0744   AST 18 05/25/2017 0734   ALT 25 06/04/2019 0744   ALT 17 05/25/2017 0734   BILITOT 0.2 (L) 06/04/2019 0744   BILITOT 0.29 05/25/2017 0734       RADIOGRAPHIC STUDIES: CT Chest W Contrast  Result Date: 06/04/2019 CLINICAL DATA:  Lung cancer, chemotherapy and radiation therapy completed 2016. Prostate cancer with seed implants September 2016 EXAM: CT CHEST WITH CONTRAST TECHNIQUE: Multidetector CT imaging of the chest was performed during intravenous contrast administration. CONTRAST:  33mL OMNIPAQUE IOHEXOL 300 MG/ML  SOLN COMPARISON:  11/30/2018, 05/29/2018 and 05/25/2017. FINDINGS: Cardiovascular: Atherosclerotic calcification of the aorta and coronary arteries. Heart size normal. No pericardial effusion. Mediastinum/Nodes: No pathologically enlarged mediastinal, hilar or axillary lymph nodes. Esophagus is unremarkable. Lungs/Pleura: Bullous emphysema. A part  solid nodule in the lateral segment right middle lobe collectively measures 1.4 x 1.6 cm (7/107), showing indolent growth from 05/25/2017, at which time it measured 1.2 x 1.3 cm. Currently, the internal solid nodular component measures 6 mm, increased from roughly 3 mm on 05/25/2017. This part solid nodule is seen within a focal bed of emphysema. Spiculated nodule in the medial aspect of the superior segment right lower lobe measures 8 mm (6 x 10 mm, 7/82),  minimally enlarged from 11/30/2018, at which time it measured 6 mm (5 x 7 mm. Left upper lobectomy. Mild scarring in the anterolateral left lower lobe. No pleural fluid. Adherent debris in the lower trachea. Upper Abdomen: Visualized portions of the liver, adrenal glands, kidneys, spleen, pancreas, stomach and bowel are grossly unremarkable. No upper abdominal adenopathy. Musculoskeletal: No worrisome lytic or sclerotic lesions. IMPRESSION: 1. Enlarging spiculated nodule in the superior segment right lower lobe, highly worrisome for adenocarcinoma. 2. Part solid nodule in the right middle lobe shows indolent growth from 05/25/2017 and is worrisome for low-grade adenocarcinoma. 3. Aortic atherosclerosis (ICD10-I70.0). Coronary artery calcification. 4.  Emphysema (ICD10-J43.9). Electronically Signed   By: Lorin Picket M.D.   On: 06/04/2019 09:43    ASSESSMENT AND PLAN:  This is a very pleasant 69 years old African-American male with a stage IIA non-small cell lung cancer, squamous cell carcinoma status post neoadjuvant systemic chemotherapy followed by left upper lobectomy and lymph node dissection in April 2016. The patient is currently on observation and he is feeling fine except for the shortness of breath with exertion. He had repeat CT scan of the chest performed recently.  I personally and independently reviewed the scan images and discussed the results with the patient today. Has a scan showed enlarging spiculated nodule in the superior segment of the right lower lobe highly suspicious for recurrent adenocarcinoma.  There was also a partially solid nodule in the right middle lobe with slowly increased size also suspicious for low-grade adenocarcinoma. I recommended for the patient to have a PET scan performed for further evaluation of these lesions. He will come back for follow-up visit in 2 weeks for evaluation and discussion of his PET scan results and further recommendation regarding management  of his condition. For his smoking cessation I strongly encouraged the patient to quit smoking and he was given handout about smoking cessation by the thoracic navigator. For hypertension he will continue with his current blood pressure medication and monitor it closely at home. The patient was advised to call immediately if he has any concerning symptoms in the interval. The patient voices understanding of current disease status and treatment options and is in agreement with the current care plan. All questions were answered. The patient knows to call the clinic with any problems, questions or concerns. We can certainly see the patient much sooner if necessary.  Disclaimer: This note was dictated with voice recognition software. Similar sounding words can inadvertently be transcribed and may not be corrected upon review.

## 2019-06-18 ENCOUNTER — Other Ambulatory Visit: Payer: Self-pay

## 2019-06-18 ENCOUNTER — Encounter (HOSPITAL_COMMUNITY)
Admission: RE | Admit: 2019-06-18 | Discharge: 2019-06-18 | Disposition: A | Discharge status: Home / Self Care | Payer: Medicare Other | Source: Ambulatory Visit | Attending: Internal Medicine | Admitting: Internal Medicine

## 2019-06-18 DIAGNOSIS — J439 Emphysema, unspecified: Secondary | ICD-10-CM | POA: Insufficient documentation

## 2019-06-18 DIAGNOSIS — R911 Solitary pulmonary nodule: Secondary | ICD-10-CM | POA: Diagnosis not present

## 2019-06-18 DIAGNOSIS — I2510 Atherosclerotic heart disease of native coronary artery without angina pectoris: Secondary | ICD-10-CM | POA: Diagnosis not present

## 2019-06-18 DIAGNOSIS — C3490 Malignant neoplasm of unspecified part of unspecified bronchus or lung: Secondary | ICD-10-CM | POA: Insufficient documentation

## 2019-06-18 DIAGNOSIS — I700 Atherosclerosis of aorta: Secondary | ICD-10-CM | POA: Insufficient documentation

## 2019-06-18 LAB — GLUCOSE, CAPILLARY: Glucose-Capillary: 123 mg/dL — ABNORMAL HIGH (ref 70–99)

## 2019-06-18 MED ORDER — FLUDEOXYGLUCOSE F - 18 (FDG) INJECTION
10.0900 | Freq: Once | INTRAVENOUS | Status: AC | PRN
  Administered 2019-06-18: 10.09 via INTRAVENOUS

## 2019-06-20 ENCOUNTER — Inpatient Hospital Stay (HOSPITAL_BASED_OUTPATIENT_CLINIC_OR_DEPARTMENT_OTHER): Payer: Medicare Other | Admitting: Internal Medicine

## 2019-06-20 ENCOUNTER — Other Ambulatory Visit: Payer: Self-pay

## 2019-06-20 ENCOUNTER — Encounter: Payer: Self-pay | Admitting: Internal Medicine

## 2019-06-20 VITALS — HR 97 | Temp 98.2°F | Resp 20 | Ht 72.0 in | Wt 205.6 lb

## 2019-06-20 DIAGNOSIS — C3490 Malignant neoplasm of unspecified part of unspecified bronchus or lung: Secondary | ICD-10-CM

## 2019-06-20 DIAGNOSIS — I10 Essential (primary) hypertension: Secondary | ICD-10-CM

## 2019-06-20 DIAGNOSIS — Z720 Tobacco use: Secondary | ICD-10-CM

## 2019-06-20 DIAGNOSIS — C3492 Malignant neoplasm of unspecified part of left bronchus or lung: Secondary | ICD-10-CM

## 2019-06-20 DIAGNOSIS — R918 Other nonspecific abnormal finding of lung field: Secondary | ICD-10-CM

## 2019-06-20 NOTE — Progress Notes (Signed)
[place] [phone]   [phone]  OFFICE PROGRESS NOTE  [name], NP [place] [id number]  DIAGNOSIS:  1) Stage IIA (T1a, N1, M0) non-small cell lung cancer, squamous cell carcinoma diagnosed in November 2015, presented with left suprahilar soft tissue mass. 2) history of prostate adenocarcinoma  PRIOR THERAPY:  1) Neoadjuvant systemic chemotherapy with carboplatin for AUC of 5 and paclitaxel 175 MG/M2 every 3 weeks with Neulasta support. First dose 06/13/2014. Status post 3 cycle. 2) Status post Left video-assisted thoracoscopy, Thoracoscopic left upper lobectomy, Mediastinal lymph node dissection under the care of Dr. Roxan Hockey on 09/09/2014.  CURRENT THERAPY: Observation.  INTERVAL HISTORY: Larry [name] 69 y.o. male returns to the clinic today for follow-up visit.  The patient is feeling fine today with no concerning complaints except for mild cough.  He denied having any chest pain, shortness of breath or hemoptysis.  He denied having any recent weight loss or night sweats.  He has no nausea, vomiting, diarrhea or constipation.  He denied having any headache or visual changes.  He was found on previous CT scan of the chest to have suspicious nodule in the right lung.  The patient underwent a PET scan and he is here today for evaluation and discussion of his scan results and treatment options.  MEDICAL HISTORY: Past Medical History:  Diagnosis Date  . 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 Cataract   . Chronic cough   . COPD (chronic obstructive pulmonary disease) ([place])   . Dyspnea on exertion   . Full dentures   . Hx of adenomatous colonic polyps 06/07/2018  . Hyperlipidemia   . Non-small cell carcinoma of left lung [facility]) oncologist-- dr Julien Nordmann   dx Nov 2015left upper lobe, 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, N1, M0)  s/p  left upper lobectomy with node dissection and neoadjuvant systemic  chemotherapy  . Productive cough    intermittantly  . Prostate cancer [facility]) urologist-- dr wrenn/  oncologsit-  dr Tammi Klippel   Stage T1c , Gleason 4+3, PSA 4.1, vol 26.73cc--  External RXT complete and Radiactive seed implants on 04-03-2015  . Stage II squamous cell carcinoma of left lung ([place]) 05/22/2014        ALLERGIES:  has No Known Allergies.  MEDICATIONS:  Current Outpatient Medications  Medication Sig Dispense Refill  . albuterol (PROVENTIL HFA;VENTOLIN HFA) 108 (90 Base) MCG/ACT inhaler Inhale 1-2 puffs into the lungs every 6 (six) hours as needed for wheezing or shortness of breath.    Marland Kitchen amLODipine (NORVASC) 10 MG tablet TAKE 1 TABLET BY MOUTH EVERY DAY 90 tablet 1  . aspirin 81 MG chewable tablet Chew 1 tablet by mouth daily.    Marland Kitchen buPROPion (WELLBUTRIN SR) 100 MG 12 hr tablet TAKE 1 TABLET BY MOUTH TWICE A DAY 180 tablet 2  . rosuvastatin (CRESTOR) 40 MG tablet Take 1 tablet (40 mg total) by mouth daily. 30 tablet 5  . SYMBICORT 160-4.5 MCG/ACT inhaler INHALE 2 PUFFS INTO THE LUNGS 2 TIMES DAILY 10.2 Inhaler 11   No current facility-administered medications for this visit.    SURGICAL HISTORY:  Past Surgical History:  Procedure Laterality Date  . COLONOSCOPY  12/ 2009  . ENDOBRONCHIAL ULTRASOUND Bilateral 04/08/2014   Procedure: ENDOBRONCHIAL ULTRASOUND;  Surgeon: Kathee Delton, MD;  Location: [facility];  Service: Cardiopulmonary;  Laterality: Bilateral;  . RADIOACTIVE SEED IMPLANT N/A 04/03/2015   Procedure: RADIOACTIVE SEED IMPLANT/BRACHYTHERAPY IMPLANT;  Surgeon: Irine Seal, MD;  Location: [facility];  Service: Urology;  Laterality: N/A;  . VIDEO ASSISTED THORACOSCOPY (VATS)/ LOBECTOMY Left 09/09/2014   Procedure: LEFT VIDEO ASSISTED THORACOSCOPY (VATS) with LEFT UPPER LOBECTOMY;  Surgeon: Melrose Nakayama, MD;  Location: [place];  Service: Thoracic;  Laterality: Left;  Marland Kitchen VIDEO BRONCHOSCOPY WITH ENDOBRONCHIAL NAVIGATION Right 04/24/2014   Procedure:  VIDEO BRONCHOSCOPY WITH ENDOBRONCHIAL NAVIGATION;  Surgeon: Collene Gobble, MD;  Location: [place];  Service: Thoracic;  Laterality: Right;  Marland Kitchen VIDEO BRONCHOSCOPY WITH ENDOBRONCHIAL ULTRASOUND Right 04/24/2014   Procedure: VIDEO BRONCHOSCOPY WITH ENDOBRONCHIAL ULTRASOUND;  Surgeon: Collene Gobble, MD;  Location: [place];  Service: Thoracic;  Laterality: Right;    REVIEW OF SYSTEMS:  Constitutional: positive for fatigue Eyes: negative Ears, nose, mouth, throat, and face: negative Respiratory: positive for cough Cardiovascular: negative Gastrointestinal: negative Genitourinary:negative Integument/breast: negative Hematologic/lymphatic: negative Musculoskeletal:negative Neurological: negative Behavioral/Psych: negative Endocrine: negative Allergic/Immunologic: negative   PHYSICAL EXAMINATION: General appearance: alert, cooperative, fatigued and no distress Head: Normocephalic, without obvious abnormality, atraumatic Neck: no adenopathy, no JVD, supple, symmetrical, trachea midline and thyroid not enlarged, symmetric, no tenderness/mass/nodules Lymph nodes: Cervical, supraclavicular, and axillary nodes normal. Resp: wheezes bilaterally Back: symmetric, no curvature. ROM normal. No CVA tenderness. Cardio: regular rate and rhythm, S1, S2 normal, no murmur, click, rub or gallop GI: soft, non-tender; bowel sounds normal; no masses,  no organomegaly Extremities: extremities normal, atraumatic, no cyanosis or edema Neurologic: Alert and oriented X 3, normal strength and tone. Normal symmetric reflexes. Normal coordination and gait  ECOG PERFORMANCE STATUS: 1 - Symptomatic but completely ambulatory  Pulse 97, temperature 98.2 F (36.8 C), temperature source Oral, resp. rate 20, height 6' (1.829 m), weight 205 lb 9.6 oz (93.3 kg), SpO2 99 %.  LABORATORY DATA: Lab Results  Component Value Date   WBC 12.5 (H) 06/04/2019   HGB 10.8 (L) 06/04/2019   HCT 31.2 (L) 06/04/2019   MCV 79.8 (L)  06/04/2019   PLT 262 06/04/2019      Chemistry      Component Value Date/Time   NA 139 06/04/2019 0744   NA 139 05/25/2017 0734   K 4.0 06/04/2019 0744   K 3.7 05/25/2017 0734   CL 105 06/04/2019 0744   CO2 22 06/04/2019 0744   CO2 23 05/25/2017 0734   BUN 16 06/04/2019 0744   BUN 10.2 05/25/2017 0734   CREATININE 0.86 06/04/2019 0744   CREATININE 0.7 05/25/2017 0734      Component Value Date/Time   CALCIUM 9.3 06/04/2019 0744   CALCIUM 9.5 05/25/2017 0734   ALKPHOS 78 06/04/2019 0744   ALKPHOS 80 05/25/2017 0734   AST 22 06/04/2019 0744   AST 18 05/25/2017 0734   ALT 25 06/04/2019 0744   ALT 17 05/25/2017 0734   BILITOT 0.2 (L) 06/04/2019 0744   BILITOT 0.29 05/25/2017 0734       RADIOGRAPHIC STUDIES: CT Chest W Contrast  Result Date: 06/04/2019 CLINICAL DATA:  Lung cancer, chemotherapy and radiation therapy completed 2016. Prostate cancer with seed implants September 2016 EXAM: CT CHEST WITH CONTRAST TECHNIQUE: Multidetector CT imaging of the chest was performed during intravenous contrast administration. CONTRAST:  62mL OMNIPAQUE IOHEXOL 300 MG/ML  SOLN COMPARISON:  11/30/2018, 05/29/2018 and 05/25/2017. FINDINGS: Cardiovascular: Atherosclerotic calcification of the aorta and coronary arteries. Heart size normal. No pericardial effusion. Mediastinum/Nodes: No pathologically enlarged mediastinal, hilar or axillary lymph nodes. Esophagus is unremarkable. Lungs/Pleura: Bullous emphysema. A part solid nodule in the lateral segment right  middle lobe collectively measures 1.4 x 1.6 cm (7/107), showing indolent growth from 05/25/2017, at which time it measured 1.2 x 1.3 cm. Currently, the internal solid nodular component measures 6 mm, increased from roughly 3 mm on 05/25/2017. This part solid nodule is seen within a focal bed of emphysema. Spiculated nodule in the medial aspect of the superior segment right lower lobe measures 8 mm (6 x 10 mm, 7/82), minimally enlarged from  11/30/2018, at which time it measured 6 mm (5 x 7 mm. Left upper lobectomy. Mild scarring in the anterolateral left lower lobe. No pleural fluid. Adherent debris in the lower trachea. Upper Abdomen: Visualized portions of the liver, adrenal glands, kidneys, spleen, pancreas, stomach and bowel are grossly unremarkable. No upper abdominal adenopathy. Musculoskeletal: No worrisome lytic or sclerotic lesions. IMPRESSION: 1. Enlarging spiculated nodule in the superior segment right lower lobe, highly worrisome for adenocarcinoma. 2. Part solid nodule in the right middle lobe shows indolent growth from 05/25/2017 and is worrisome for low-grade adenocarcinoma. 3. Aortic atherosclerosis (ICD10-I70.0). Coronary artery calcification. 4.  Emphysema (ICD10-J43.9). Electronically Signed   By: Lorin Picket M.D.   On: 06/04/2019 09:43   NM PET Image Restag (PS) Skull Base To Thigh  Result Date: 06/18/2019 CLINICAL DATA:  Subsequent treatment strategy for restaging of non-small-cell lung cancer. Left upper lobectomy in 2016. Prostate cancer. EXAM: NUCLEAR MEDICINE PET SKULL BASE TO THIGH TECHNIQUE: 10.1 mCi F-18 FDG was injected intravenously. Full-ring PET imaging was performed from the skull base to thigh after the radiotracer. CT data was obtained and used for attenuation correction and anatomic localization. Fasting blood glucose: 123 mg/dl COMPARISON:  Chest CTs, most recent 06/04/2019. Most recent PET of 02/05/2014. FINDINGS: Mediastinal blood pool activity: SUV max 3.0 Liver activity: SUV max NA NECK: No areas of abnormal hypermetabolism. Incidental CT findings: Bilateral carotid atherosclerosis. No cervical adenopathy. CHEST: A right paratracheal node measures 6 mm (maintains its fatty hilum) and a S.U.V. max of 4.0 on 71/4. This was similar in size back to 02/05/2014 CT. The right middle lobe mixed attenuation pulmonary nodule is not significantly hypermetabolic. Measures 1.4 cm and a S.U.V. max of 1.2 on 48/8.  Superior segment right lower lobe pulmonary nodule measures 9 mm and a S.U.V. max of 2.2 on 39/8. Incidental CT findings: Deferred to recent diagnostic CT. Emphysema. Coronary and aortic atherosclerosis. ABDOMEN/PELVIS: No abdominopelvic parenchymal or nodal hypermetabolism. Incidental CT findings: Normal adrenal glands. Abdominal aortic atherosclerosis. Radiation seeds in the prostate. SKELETON: No abnormal marrow activity. Incidental CT findings: none IMPRESSION: 1. The right middle lobe pulmonary nodule is not significantly hypermetabolic, but given morphology interval enlargement, remains suspicious for indolent primary bronchogenic carcinoma. 2. The superior segment right lower lobe pulmonary nodule demonstrates equivocal hypermetabolism. Based on level of hypermetabolism at lesion size and interval enlargement, this also suspicious for primary bronchogenic carcinoma. 3. Mildly hypermetabolic right paratracheal node, favored to be reactive given size stability back to 2015. 4. No findings of extrathoracic metastasis. 5. Aortic atherosclerosis (ICD10-I70.0), coronary artery atherosclerosis and emphysema (ICD10-J43.9). Electronically Signed   By: Abigail Miyamoto M.D.   On: 06/18/2019 09:45    ASSESSMENT AND PLAN:  This is a very pleasant 69 years old African-American male with a stage IIA non-small cell lung cancer, squamous cell carcinoma status post neoadjuvant systemic chemotherapy followed by left upper lobectomy and lymph node dissection in April 2016. The patient has been on observation since that time. Recent CT scan of the chest showed enlarging spiculated nodule in the superior  segment of the right lower lobe highly suspicious for recurrent adenocarcinoma.  There was also a partially solid nodule in the right middle lobe with slowly increased size also suspicious for low-grade adenocarcinoma. I ordered a PET scan which was performed recently and the patient is here today for evaluation and discussion  of his PET scan results. I personally and independently reviewed the scan images and discussed the results with the patient today. His scan showed low hypermetabolic activity in the suspicious nodule but still suspicious for slowly growing adenocarcinoma. I recommended for the patient to see Dr. Roxan Hockey for evaluation and consideration of discussion of surgical resection or biopsy. I will arrange for the patient to come back for follow-up visit in 6 months for evaluation and repeat CT scan of the chest whether he had the surgery or not. For smoking cessation I strongly encouraged the patient to quit smoking. He was advised to call immediately if he has any other concerning symptoms in the interval. The patient voices understanding of current disease status and treatment options and is in agreement with the current care plan. All questions were answered. The patient knows to call the clinic with any problems, questions or concerns. We can certainly see the patient much sooner if necessary.  Disclaimer: This note was dictated with voice recognition software. Similar sounding words can inadvertently be transcribed and may not be corrected upon review.

## 2019-06-26 ENCOUNTER — Other Ambulatory Visit: Payer: Self-pay | Admitting: *Deleted

## 2019-06-26 ENCOUNTER — Institutional Professional Consult (permissible substitution) (INDEPENDENT_AMBULATORY_CARE_PROVIDER_SITE_OTHER): Payer: Medicare Other | Admitting: Thoracic Surgery (Cardiothoracic Vascular Surgery)

## 2019-06-26 ENCOUNTER — Encounter: Payer: Self-pay | Admitting: Thoracic Surgery (Cardiothoracic Vascular Surgery)

## 2019-06-26 ENCOUNTER — Other Ambulatory Visit: Payer: Self-pay

## 2019-06-26 VITALS — BP 150/79 | HR 94 | Temp 97.9°F | Resp 18 | Ht 72.0 in | Wt 205.0 lb

## 2019-06-26 DIAGNOSIS — Z902 Acquired absence of lung [part of]: Secondary | ICD-10-CM | POA: Diagnosis not present

## 2019-06-26 DIAGNOSIS — D381 Neoplasm of uncertain behavior of trachea, bronchus and lung: Secondary | ICD-10-CM

## 2019-06-26 DIAGNOSIS — J449 Chronic obstructive pulmonary disease, unspecified: Secondary | ICD-10-CM

## 2019-06-26 DIAGNOSIS — C3492 Malignant neoplasm of unspecified part of left bronchus or lung: Secondary | ICD-10-CM

## 2019-06-26 DIAGNOSIS — Z01818 Encounter for other preprocedural examination: Secondary | ICD-10-CM

## 2019-06-26 DIAGNOSIS — R911 Solitary pulmonary nodule: Secondary | ICD-10-CM

## 2019-06-26 NOTE — Progress Notes (Signed)
PCP is [name], NP Referring Provider is Curt Bears, MD  Chief Complaint  Patient presents with  . Lung Lesion    RLLOBE, RMLOBE....CT CHEST 06/04/19, PET 06/18/19.....s/p LULobectomy 09/09/14    HPI: Larry [name] is sent sent for consultation regarding right middle and right lower lobe lung nodules.  Larry [name] is a 69 year old man with a history of tobacco abuse, COPD, lung cancer, prostate cancer, hyperlipidemia, and angiodysplasia of the cecum.  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  He was treated with neoadjuvant chemotherapy and then underwent left upper lobectomy.  He did not require any adjuvant therapy postoperatively.  He has been followed by Dr. Julien Nordmann since that time.  In July 2020 he had 2 small nodules one in the middle lobe and one in the superior segment of the lower lobe centrally.  On follow-up scan recently the middle lobe nodule stable but the nodule in the superior segment of the lower lobe had increased in size.  A PET/CT was done which showed mild metabolic activity in the lower lobe nodule with an SUV of 2.2.  The SUV of the middle lobe nodule was 1.2.  He continues to smoke.  He says he is smoking less than a pack a day.  He does get short of breath with exertion.  He is not having any chest pain, pressure, or tightness.  He states "I keep a cold all the time."  He describes as frequent coughing and wheezing. Past Medical History:  Diagnosis Date  . 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 Cataract   . Chronic cough   . COPD (chronic obstructive pulmonary disease) ([place])   . Dyspnea on exertion   . Full dentures   . Hx of adenomatous colonic polyps 06/07/2018  . Hyperlipidemia   . Non-small cell carcinoma of left lung [facility]) oncologist-- dr Julien Nordmann   dx Nov 2015left upper lobe, 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, N1, M0)  s/p  left upper lobectomy with node dissection and neoadjuvant systemic  chemotherapy  . Productive cough    intermittantly  . Prostate cancer [facility]) urologist-- dr wrenn/  oncologsit-  dr Tammi Klippel   Stage T1c , Gleason 4+3, PSA 4.1, vol 26.73cc--  External RXT complete and Radiactive seed implants on 04-03-2015  . Stage II squamous cell carcinoma of left lung ([place]) 05/22/2014        Past Surgical History:  Procedure Laterality Date  . COLONOSCOPY  12/ 2009  . ENDOBRONCHIAL ULTRASOUND Bilateral 04/08/2014   Procedure: ENDOBRONCHIAL ULTRASOUND;  Surgeon: Kathee Delton, MD;  Location: [facility];  Service: Cardiopulmonary;  Laterality: Bilateral;  . RADIOACTIVE SEED IMPLANT N/A 04/03/2015   Procedure: RADIOACTIVE SEED IMPLANT/BRACHYTHERAPY IMPLANT;  Surgeon: Irine Seal, MD;  Location: [facility];  Service: Urology;  Laterality: N/A;  . VIDEO ASSISTED THORACOSCOPY (VATS)/ LOBECTOMY Left 09/09/2014   Procedure: LEFT VIDEO ASSISTED THORACOSCOPY (VATS) with LEFT UPPER LOBECTOMY;  Surgeon: Melrose Nakayama, MD;  Location: [place];  Service: Thoracic;  Laterality: Left;  Marland Kitchen VIDEO BRONCHOSCOPY WITH ENDOBRONCHIAL NAVIGATION Right 04/24/2014   Procedure: VIDEO BRONCHOSCOPY WITH ENDOBRONCHIAL NAVIGATION;  Surgeon: Collene Gobble, MD;  Location: [place];  Service: Thoracic;  Laterality: Right;  Marland Kitchen VIDEO BRONCHOSCOPY WITH ENDOBRONCHIAL ULTRASOUND Right 04/24/2014   Procedure: VIDEO BRONCHOSCOPY WITH ENDOBRONCHIAL ULTRASOUND;  Surgeon: Collene Gobble, MD;  Location: [place];  Service: Thoracic;  Laterality: Right;    Family History  Problem  Relation Age of Onset  . Heart attack Mother   . Diabetes Mother   . Heart attack Father   . Heart failure Father   . Cancer Brother   . Diabetes Sister   . Colon cancer Neg Hx   . Esophageal cancer Neg Hx   . Rectal cancer Neg Hx   . Stomach cancer Neg Hx     Social History Social History   Tobacco Use  . Smoking status: Current Every Day Smoker    Packs/day: 1.00    Years: 45.00    Pack years: 45.00    Types:  Cigarettes  . Smokeless tobacco: Never Used  . Tobacco comment: 1/2 pack per day-- down from from 1 ppd  Substance Use Topics  . Alcohol use: No    Alcohol/week: 0.0 standard drinks  . Drug use: No    Current Outpatient Medications  Medication Sig Dispense Refill  . albuterol (PROVENTIL HFA;VENTOLIN HFA) 108 (90 Base) MCG/ACT inhaler Inhale 1-2 puffs into the lungs every 6 (six) hours as needed for wheezing or shortness of breath.    Marland Kitchen amLODipine (NORVASC) 10 MG tablet TAKE 1 TABLET BY MOUTH EVERY DAY 90 tablet 1  . aspirin 81 MG chewable tablet Chew 1 tablet by mouth daily.    Marland Kitchen buPROPion (WELLBUTRIN SR) 100 MG 12 hr tablet TAKE 1 TABLET BY MOUTH TWICE A DAY 180 tablet 2  . rosuvastatin (CRESTOR) 40 MG tablet Take 1 tablet (40 mg total) by mouth daily. 30 tablet 5  . SYMBICORT 160-4.5 MCG/ACT inhaler INHALE 2 PUFFS INTO THE LUNGS 2 TIMES DAILY 10.2 Inhaler 11   No current facility-administered medications for this visit.    No Known Allergies  Review of Systems  Constitutional: Negative for activity change and unexpected weight change.  HENT: Positive for dental problem (Dentures). Negative for trouble swallowing and voice change.   Eyes: Negative for visual disturbance.  Respiratory: Positive for cough, shortness of breath and wheezing.   Cardiovascular: Negative for chest pain and leg swelling.  Gastrointestinal: Negative for abdominal distention and abdominal pain.  Genitourinary: Negative for dysuria.  Neurological: Negative for seizures, syncope and weakness.  Hematological: Negative for adenopathy. Does not bruise/bleed easily.  All other systems reviewed and are negative.   BP (!) 150/79 (BP Location: Right Arm, Patient Position: Sitting, Cuff Size: Normal)   Pulse 94   Temp 97.9 F (36.6 C)   Resp 18   Ht 6' (1.829 m)   Wt 205 lb (93 kg)   SpO2 99% Comment: RA  BMI 27.80 kg/m  Physical Exam Vitals reviewed.  Constitutional:      Appearance: Normal appearance.   HENT:     Head: Normocephalic and atraumatic.  Eyes:     General: No scleral icterus.    Extraocular Movements: Extraocular movements intact.  Cardiovascular:     Rate and Rhythm: Normal rate and regular rhythm.     Heart sounds: Normal heart sounds. No murmur.  Pulmonary:     Effort: Pulmonary effort is normal. No respiratory distress.     Breath sounds: Rhonchi (Prominent bilaterally) present.  Abdominal:     General: There is no distension.     Palpations: Abdomen is soft.     Tenderness: There is no abdominal tenderness.  Musculoskeletal:        General: No swelling.  Skin:    General: Skin is warm and dry.  Neurological:     General: No focal deficit present.  Mental Status: He is alert and oriented to person, place, and time.     Cranial Nerves: No cranial nerve deficit.     Motor: No weakness.     Gait: Gait normal.    Diagnostic Tests: CT CHEST WITH CONTRAST  TECHNIQUE: Multidetector CT imaging of the chest was performed during intravenous contrast administration.  CONTRAST:  61mL OMNIPAQUE IOHEXOL 300 MG/ML  SOLN  COMPARISON:  11/30/2018, 05/29/2018 and 05/25/2017.  FINDINGS: Cardiovascular: Atherosclerotic calcification of the aorta and coronary arteries. Heart size normal. No pericardial effusion.  Mediastinum/Nodes: No pathologically enlarged mediastinal, hilar or axillary lymph nodes. Esophagus is unremarkable.  Lungs/Pleura: Bullous emphysema. A part solid nodule in the lateral segment right middle lobe collectively measures 1.4 x 1.6 cm (7/107), showing indolent growth from 05/25/2017, at which time it measured 1.2 x 1.3 cm. Currently, the internal solid nodular component measures 6 mm, increased from roughly 3 mm on 05/25/2017. This part solid nodule is seen within a focal bed of emphysema. Spiculated nodule in the medial aspect of the superior segment right lower lobe measures 8 mm (6 x 10 mm, 7/82), minimally enlarged from 11/30/2018,  at which time it measured 6 mm (5 x 7 mm. Left upper lobectomy. Mild scarring in the anterolateral left lower lobe. No pleural fluid. Adherent debris in the lower trachea.  Upper Abdomen: Visualized portions of the liver, adrenal glands, kidneys, spleen, pancreas, stomach and bowel are grossly unremarkable. No upper abdominal adenopathy.  Musculoskeletal: No worrisome lytic or sclerotic lesions.  IMPRESSION: 1. Enlarging spiculated nodule in the superior segment right lower lobe, highly worrisome for adenocarcinoma. 2. Part solid nodule in the right middle lobe shows indolent growth from 05/25/2017 and is worrisome for low-grade adenocarcinoma. 3. Aortic atherosclerosis (ICD10-I70.0). Coronary artery calcification. 4.  Emphysema (ICD10-J43.9).   Electronically Signed   By: Lorin Picket M.D.   On: 06/04/2019 09:43 NUCLEAR MEDICINE PET SKULL BASE TO THIGH  TECHNIQUE: 10.1 mCi F-18 FDG was injected intravenously. Full-ring PET imaging was performed from the skull base to thigh after the radiotracer. CT data was obtained and used for attenuation correction and anatomic localization.  Fasting blood glucose: 123 mg/dl  COMPARISON:  Chest CTs, most recent 06/04/2019. Most recent PET of 02/05/2014.  FINDINGS: Mediastinal blood pool activity: SUV max 3.0  Liver activity: SUV max NA  NECK: No areas of abnormal hypermetabolism.  Incidental CT findings: Bilateral carotid atherosclerosis. No cervical adenopathy.  CHEST: A right paratracheal node measures 6 mm (maintains its fatty hilum) and a S.U.V. max of 4.0 on 71/4. This was similar in size back to 02/05/2014 CT.  The right middle lobe mixed attenuation pulmonary nodule is not significantly hypermetabolic. Measures 1.4 cm and a S.U.V. max of 1.2 on 48/8.  Superior segment right lower lobe pulmonary nodule measures 9 mm and a S.U.V. max of 2.2 on 39/8.  Incidental CT findings: Deferred to recent  diagnostic CT. Emphysema. Coronary and aortic atherosclerosis.  ABDOMEN/PELVIS: No abdominopelvic parenchymal or nodal hypermetabolism.  Incidental CT findings: Normal adrenal glands. Abdominal aortic atherosclerosis. Radiation seeds in the prostate.  SKELETON: No abnormal marrow activity.  Incidental CT findings: none  IMPRESSION: 1. The right middle lobe pulmonary nodule is not significantly hypermetabolic, but given morphology interval enlargement, remains suspicious for indolent primary bronchogenic carcinoma. 2. The superior segment right lower lobe pulmonary nodule demonstrates equivocal hypermetabolism. Based on level of hypermetabolism at lesion size and interval enlargement, this also suspicious for primary bronchogenic carcinoma. 3. Mildly hypermetabolic right paratracheal node, favored  to be reactive given size stability back to 2015. 4. No findings of extrathoracic metastasis. 5. Aortic atherosclerosis (ICD10-I70.0), coronary artery atherosclerosis and emphysema (ICD10-J43.9).   Electronically Signed   By: Abigail Miyamoto M.D.   On: 06/18/2019 09:45 I personally reviewed the CT and PET/CT images and concur with the findings noted above  Impression: Larry [name] is a 69 year old gentleman with a history of tobacco abuse, lung cancer (stage IIb squamous cell carcinoma status post left upper lobectomy), prostate cancer, hyperlipidemia, and angiodysplasia of the cecum.  Unfortunately he continues to smoke.  He has been followed with CT scans since his cancer treatment back in 2016.  Recently has been noted to have nodules in the right middle lobe and superior segment of the right lower lobe that have increased in size.  The lower lobe nodule had mild uptake.  There was no significant uptake in the middle lobe nodule but it is mostly cystic.  Both nodules are concerning for synchronous bronchogenic carcinomas.  Metastatic disease is possible but neither of these has a  typical appearance of metastasis.  Infectious and inflammatory nodules are also in the differential diagnosis.  Unfortunately these were not particularly easy lesions to biopsy.  The middle lobe nodules approachable but the solid component so small it may be difficult to get a definitive answer.  The lesion in the superior segment of the lower lobe would be extremely difficult to get to with navigational bronchoscopy.  It might be possible to sample with EBUS via the bronchus intermedius but there is a high likelihood of that being unsuccessful.  From surgical perspective the middle lobe nodule would be relatively straightforward to remove but again the lower lobe nodule would be difficult due to its proximity to the hilum and the fissure.  Achieving a margin would be extremely difficult.  On exam he has significant rhonchi bilaterally.  He tells me he has a "cold" all the time.  He is on Symbicort and also has an albuterol inhaler.  He would need significant pulmonary to not prior to any consideration for surgical resection.  I am getting go ahead and refer him to pulmonology.  He also would need pulmonary function testing as he does have very significant emphysema on CT.  That being said I would not completely rule out surgery at this time.  The other option would be to do a bronchoscopic biopsy and then refer for stereotactic radiation.  I want to see if we can improve his pulmonary status and then he can make the decision.  He continues to smoke.  He says he is currently smoking less than a pack of cigarettes daily.  That may limit what pulmonary is able to do with him.  The importance of tobacco cessation was emphasized.  Plan: Pulmonary function testing with and without bronchodilators Pulmonary consultation for preoperative optimization Return in 3 weeks to discuss possible surgery versus biopsy prior to radiation.  Melrose Nakayama, MD [facility] [phone]

## 2019-06-29 ENCOUNTER — Other Ambulatory Visit (HOSPITAL_COMMUNITY)
Admission: RE | Admit: 2019-06-29 | Discharge: 2019-06-29 | Disposition: A | Discharge status: Home / Self Care | Payer: Medicare Other | Source: Ambulatory Visit | Attending: Thoracic Surgery (Cardiothoracic Vascular Surgery) | Admitting: Thoracic Surgery (Cardiothoracic Vascular Surgery)

## 2019-06-29 DIAGNOSIS — Z01812 Encounter for preprocedural laboratory examination: Secondary | ICD-10-CM | POA: Diagnosis present

## 2019-06-29 DIAGNOSIS — Z20822 Contact with and (suspected) exposure to covid-19: Secondary | ICD-10-CM | POA: Insufficient documentation

## 2019-06-29 LAB — SARS CORONAVIRUS 2 (TAT 6-24 HRS): SARS Coronavirus 2: NEGATIVE

## 2019-07-02 ENCOUNTER — Other Ambulatory Visit: Payer: Self-pay

## 2019-07-02 ENCOUNTER — Ambulatory Visit (INDEPENDENT_AMBULATORY_CARE_PROVIDER_SITE_OTHER): Payer: Medicare Other | Admitting: Emergency Medicine

## 2019-07-02 ENCOUNTER — Encounter: Payer: Self-pay | Admitting: Emergency Medicine

## 2019-07-02 DIAGNOSIS — R911 Solitary pulmonary nodule: Secondary | ICD-10-CM

## 2019-07-02 DIAGNOSIS — J449 Chronic obstructive pulmonary disease, unspecified: Secondary | ICD-10-CM

## 2019-07-02 DIAGNOSIS — Z01818 Encounter for other preprocedural examination: Secondary | ICD-10-CM

## 2019-07-02 LAB — PULMONARY FUNCTION TEST
DL/VA % pred: 77 %
DL/VA: 3.13 ml/min/mmHg/L
DLCO cor % pred: 63 %
DLCO cor: 18.08 ml/min/mmHg
DLCO unc % pred: 55 %
DLCO unc: 15.79 ml/min/mmHg
FEF 25-75 Post: 0.73 L/sec
FEF 25-75 Pre: 0.65 L/sec
FEF2575-%Change-Post: 12 %
FEF2575-%Pred-Post: 25 %
FEF2575-%Pred-Pre: 23 %
FEV1-%Change-Post: -1 %
FEV1-%Pred-Post: 44 %
FEV1-%Pred-Pre: 45 %
FEV1-Post: 1.46 L
FEV1-Pre: 1.48 L
FEV1FVC-%Change-Post: -7 %
FEV1FVC-%Pred-Pre: 70 %
FEV6-%Change-Post: 6 %
FEV6-%Pred-Post: 68 %
FEV6-%Pred-Pre: 64 %
FEV6-Post: 2.81 L
FEV6-Pre: 2.65 L
FEV6FVC-%Change-Post: -1 %
FEV6FVC-%Pred-Post: 100 %
FEV6FVC-%Pred-Pre: 101 %
FVC-%Change-Post: 6 %
FVC-%Pred-Post: 68 %
FVC-%Pred-Pre: 63 %
FVC-Post: 2.92 L
FVC-Pre: 2.73 L
Post FEV1/FVC ratio: 50 %
Post FEV6/FVC ratio: 96 %
Pre FEV1/FVC ratio: 54 %
Pre FEV6/FVC Ratio: 97 %
RV % pred: 135 %
RV: 3.5 L
TLC % pred: 88 %
TLC: 6.74 L

## 2019-07-02 MED ORDER — STIOLTO RESPIMAT 2.5-2.5 MCG/ACT IN AERS: 2.0000 | INHALATION_SPRAY | Freq: Every day | RESPIRATORY_TRACT | 0 refills | Status: DC

## 2019-07-02 NOTE — Progress Notes (Signed)
Full PFT performed today. °

## 2019-07-02 NOTE — Progress Notes (Signed)
Subjective:    Patient ID: Larry [name], male    DOB: 18-Jan-1951, 69 y.o.   MRN: [id number]  HPI 69 year old smoker (106 pack years, smoking 0.5 pk/day) with COPD, left upper lobe squamous cell lung cancer (lobectomy, neoadjuvant chemo), prostate cancer (external beam radiation), has been followed with Dr. Julien Nordmann.  Surveillance has revealed an enlarging left lower lobe superior segmental pulmonary nodule with mild hypermetabolism on PET scan done 06/18/2019.  Also with a stable middle lobe nodule concerning for primary malignancy.  Due to the location of the nodules and the patient's underlying lung disease decision making regarding either navigational bronchoscopy or primary resection is complicated.  He has seen Dr. Roxan Hockey to discuss.  Referred here to assess his COPD, hopefully optimize his pulmonary status.  He feels that he has good exertional tolerance. He coughs when he smokes, usually non-productive. He says he "keeps a cold", but on further questioning he is not having nasal obstruction, congestion, fevers. No productive cough or chest mucous.   Pulmonary function testing done today reviewed by me, shows very severe obstruction with FEV1 45% predicted, no bronchodilator response, borderline hyperinflation (probably some pseudonormalization due to his prior left upper lobe lobectomy), decreased diffusion capacity that does not correct to the normal range when adjusted for his alveolar volume.   Currently managed on Symbicort and albuterol.  He uses the albuterol approximately 1-2x a day, but often not really having symptoms at the time.  Smoking 0.5 pk/day, has tried stopping before, currently on Wellbutrin but without much effect.    Review of Systems As above.    Past Medical History:  Diagnosis Date  . 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 Cataract   . Chronic cough   . COPD (chronic obstructive pulmonary disease) ([place])   . Dyspnea on exertion   . Full dentures   . Hx of  adenomatous colonic polyps 06/07/2018  . Hyperlipidemia   . Non-small cell carcinoma of left lung [facility]) oncologist-- dr Julien Nordmann   dx Nov 2015left upper lobe, 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, N1, M0)  s/p  left upper lobectomy with node dissection and neoadjuvant systemic chemotherapy  . Productive cough    intermittantly  . Prostate cancer [facility]) urologist-- dr wrenn/  oncologsit-  dr Tammi Klippel   Stage T1c , Gleason 4+3, PSA 4.1, vol 26.73cc--  External RXT complete and Radiactive seed implants on 04-03-2015  . Stage II squamous cell carcinoma of left lung ([place]) 05/22/2014         Family History  Problem Relation Age of Onset  . Heart attack Mother   . Diabetes Mother   . Heart attack Father   . Heart failure Father   . Cancer Brother   . Diabetes Sister   . Colon cancer Neg Hx   . Esophageal cancer Neg Hx   . Rectal cancer Neg Hx   . Stomach cancer Neg Hx      Social History   Socioeconomic History  . Marital status: Single    Spouse name: Not on file  . Number of children: 0  . Years of education: Not on file  . Highest education level: Not on file  Occupational History  . Occupation: unemployed  Tobacco Use  . Smoking status: Current Every Day Smoker    Packs/day: 2.00    Years: 53.00    Pack years: 106.00    Types: Cigarettes  . Smokeless tobacco: Never Used  . Tobacco comment: 1/2 pack per day  Substance and Sexual Activity  . Alcohol use: No    Alcohol/week: 0.0 standard drinks  . Drug use: No  . Sexual activity: Not on file  Other Topics Concern  . Not on file  Social History Narrative  . Not on file   Social Determinants of Health   Financial Resource Strain:   . Difficulty of Paying Living Expenses: Not on file  Food Insecurity:   . Worried About Charity fundraiser in the Last Year: Not on file  . Ran Out of Food in the Last Year: Not on file  Transportation Needs:   . Lack of Transportation (Medical): Not on file  . Lack of Transportation  (Non-Medical): Not on file  Physical Activity:   . Days of Exercise per Week: Not on file  . Minutes of Exercise per Session: Not on file  Stress:   . Feeling of Stress : Not on file  Social Connections:   . Frequency of Communication with Friends and Family: Not on file  . Frequency of Social Gatherings with Friends and Family: Not on file  . Attends Religious Services: Not on file  . Active Member of Clubs or Organizations: Not on file  . Attends Archivist Meetings: Not on file  . Marital Status: Not on file  Intimate Partner Violence:   . Fear of Current or Ex-Partner: Not on file  . Emotionally Abused: Not on file  . Physically Abused: Not on file  . Sexually Abused: Not on file     No Known Allergies   Outpatient Medications Prior to Visit  Medication Sig Dispense Refill  . albuterol (PROVENTIL HFA;VENTOLIN HFA) 108 (90 Base) MCG/ACT inhaler Inhale 1-2 puffs into the lungs every 6 (six) hours as needed for wheezing or shortness of breath.    Marland Kitchen amLODipine (NORVASC) 10 MG tablet TAKE 1 TABLET BY MOUTH EVERY DAY 90 tablet 1  . aspirin 81 MG chewable tablet Chew 1 tablet by mouth daily.    Marland Kitchen buPROPion (WELLBUTRIN SR) 100 MG 12 hr tablet TAKE 1 TABLET BY MOUTH TWICE A DAY 180 tablet 2  . rosuvastatin (CRESTOR) 40 MG tablet Take 1 tablet (40 mg total) by mouth daily. 30 tablet 5  . SYMBICORT 160-4.5 MCG/ACT inhaler INHALE 2 PUFFS INTO THE LUNGS 2 TIMES DAILY 10.2 Inhaler 11   No facility-administered medications prior to visit.       Objective:   Physical Exam Vitals:   07/02/19 1206  BP: 112/78  Pulse: 85  SpO2: 100%  Weight: 205 lb (93 kg)  Height: 6' 0.9" (1.852 m)   Gen: Pleasant, well-nourished, in no distress,  normal affect  ENT: No lesions,  mouth clear,  oropharynx clear, no postnasal drip, poor hearing  Neck: No JVD, some coarse upper airway noise on inspiration and expiration  Lungs: No use of accessory muscles, distant but no real wheezing, he  does have referred upper airway noise, low pitched  Cardiovascular: RRR, heart sounds normal, no murmur or gallops, no peripheral edema  Musculoskeletal: No deformities, no cyanosis or clubbing  Neuro: alert, awake, non focal  Skin: Warm, no lesions or rash      Assessment & Plan:  COPD mixed type Very severe obstruction noted on his PFT from today with hyperinflation.  That being said his FEV1 is still greater than 1 L and he may be able to tolerate surgical intervention.  His clinical symptoms do not seem to be limiting him significantly unless he is minimizing  with me.  He does not have frequent exacerbations, and I am unable to establish that he has significant bronchitic component with mucus production, so I think it would be reasonable to change his Symbicort to Stiolto to see if we can optimize his pulmonary function (add LAMA, but lose ICS).  We will also ensure that he does not desaturate with ambulation today.  Once we have tried him on optimal BD therapy plan to to discuss with Dr. Roxan Hockey.  Clearly he is marginal for resection.   Baltazar Apo, MD, PhD 07/02/2019, 12:43 PM [facility] [phone] or if no answer [phone]

## 2019-07-02 NOTE — Patient Instructions (Signed)
Please stop your Symbicort for now We will try Stiolto 2 puffs once daily. Keep your albuterol available to use 2 puffs if needed for shortness breath, chest tightness, wheezing. Your walking oxygen test today showed that your oxygen level does not drop with exertion.  This is good news We will talk next time about strategies to help you cut down on your smoking. Depending on how your breathing progresses we will need to discuss the options for management of your pulmonary nodules with Dr. Roxan Hockey Follow with Dr Lamonte Sakai in 1 month

## 2019-07-02 NOTE — Progress Notes (Signed)
Patient seen in the office today and instructed on use of Stiolto Respimat.  Patient expressed understanding and demonstrated technique.

## 2019-07-02 NOTE — Assessment & Plan Note (Addendum)
Very severe obstruction noted on his PFT from today with hyperinflation.  That being said his FEV1 is still greater than 1 L and he may be able to tolerate surgical intervention.  His clinical symptoms do not seem to be limiting him significantly unless he is minimizing with me.  He does not have frequent exacerbations, and I am unable to establish that he has significant bronchitic component with mucus production, so I think it would be reasonable to change his Symbicort to Stiolto to see if we can optimize his pulmonary function (add LAMA, but lose ICS).  We will also ensure that he does not desaturate with ambulation today.  Once we have tried him on optimal BD therapy plan to to discuss with Dr. Roxan Hockey.  Clearly he is marginal for resection.

## 2019-07-17 ENCOUNTER — Encounter: Payer: Medicare Other | Admitting: Thoracic Surgery (Cardiothoracic Vascular Surgery)

## 2019-07-17 ENCOUNTER — Encounter: Payer: Self-pay | Admitting: *Deleted

## 2019-07-17 ENCOUNTER — Other Ambulatory Visit: Payer: Self-pay

## 2019-07-17 ENCOUNTER — Other Ambulatory Visit: Payer: Self-pay | Admitting: *Deleted

## 2019-07-17 ENCOUNTER — Ambulatory Visit (INDEPENDENT_AMBULATORY_CARE_PROVIDER_SITE_OTHER): Payer: Medicare Other | Admitting: Thoracic Surgery (Cardiothoracic Vascular Surgery)

## 2019-07-17 VITALS — BP 145/74 | HR 100 | Temp 97.8°F | Resp 20 | Ht 72.0 in | Wt 207.0 lb

## 2019-07-17 DIAGNOSIS — R918 Other nonspecific abnormal finding of lung field: Secondary | ICD-10-CM

## 2019-07-17 DIAGNOSIS — R911 Solitary pulmonary nodule: Secondary | ICD-10-CM

## 2019-07-17 MED ORDER — STIOLTO RESPIMAT 2.5-2.5 MCG/ACT IN AERS: 2.0000 | INHALATION_SPRAY | Freq: Every day | RESPIRATORY_TRACT | 0 refills | Status: DC

## 2019-07-17 NOTE — H&P (View-Only) (Signed)
[address]       [place] [address]             [phone]     HPI: Larry [name] returns to discuss management of his 2 right lung nodules.   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  In 2016 he was found to have a squamous cell carcinoma of the left upper lobe.  He had neoadjuvant chemotherapy followed by left upper lobectomy.  He has been followed since that time.  In July 2020 had 2 small nodules in the right lung.  A follow-up CT showed slight increase in size of one of the nodules.  On PET CT right lower lobe superior segment nodule had an SUV of 2.2 and middle lobe cavitary nodule had an SUV of 1.2.  There was no hypermetabolic adenopathy.  I saw him in January.  We discussed possible surgical resection but he was having a lot of respiratory issues at that time.  I referred him to Dr. Lamonte Sakai.  Dr. Lamonte Sakai changed his inhalers.  He continues to smoke but says he is down to a few cigarettes a day.  He has noted significant improvement with his breathing and exercise tolerance with his new inhaler.  He is asking for a refill on that inhaler.  Past Medical History:  Diagnosis Date  . 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 Cataract   . Chronic cough   . COPD (chronic obstructive pulmonary disease) ([place])   . Dyspnea on exertion   . Full dentures   . Hx of adenomatous colonic polyps 06/07/2018  . Hyperlipidemia   . Non-small cell carcinoma of left lung [facility]) oncologist-- dr Julien Nordmann   dx Nov 2015left upper lobe, 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, N1, M0)  s/p  left upper lobectomy with node dissection and neoadjuvant systemic chemotherapy  . Productive cough    intermittantly  . Prostate cancer [facility]) urologist-- dr wrenn/  oncologsit-  dr Tammi Klippel   Stage T1c , Gleason 4+3, PSA 4.1, vol 26.73cc--   External RXT complete and Radiactive seed implants on 04-03-2015  . Stage II squamous cell carcinoma of left lung ([place]) 05/22/2014        Current Outpatient Medications  Medication Sig Dispense Refill  . albuterol (PROVENTIL HFA;VENTOLIN HFA) 108 (90 Base) MCG/ACT inhaler Inhale 1-2 puffs into the lungs every 6 (six) hours as needed for wheezing or shortness of breath.    Marland Kitchen amLODipine (NORVASC) 10 MG tablet TAKE 1 TABLET BY MOUTH EVERY DAY 90 tablet 1  . aspirin 81 MG chewable tablet Chew 1 tablet by mouth daily.    Marland Kitchen buPROPion (WELLBUTRIN SR) 100 MG 12 hr tablet TAKE 1 TABLET BY MOUTH TWICE A DAY 180 tablet 2  . rosuvastatin (CRESTOR) 40 MG tablet Take 1 tablet (40 mg total) by mouth daily. 30 tablet 5  . SYMBICORT 160-4.5 MCG/ACT inhaler INHALE 2 PUFFS INTO THE LUNGS 2 TIMES DAILY 10.2 Inhaler 11  . Tiotropium Bromide-Olodaterol (STIOLTO RESPIMAT) 2.5-2.5 MCG/ACT AERS Inhale 2 puffs into the lungs daily. 8 g 0   No current facility-administered medications for this visit.    Physical Exam BP (!) 145/74   Pulse 100   Temp 97.8 F (36.6 C) (Skin)   Resp 20   Ht 6' (1.829 m)   Wt 207 lb (  93.9 kg)   SpO2 94% Comment: RA  BMI 28.67 kg/m  69 year old man in no acute distress Alert and oriented x3 with no focal deficits Lungs diminished at left base with faint wheezing bilaterally Cardiac regular rate and rhythm, no murmur or gallop Abdomen soft nontender No clubbing cyanosis or edema  Diagnostic Tests: Pulmonary function testing FVC 2.73 (63%) FEV1 1.48 (45%) No change with bronchodilator TLC 6.74 (88%) RV 3.50 (135%) DLCO 18.08 (63%)  CT CHEST WITH CONTRAST  TECHNIQUE: Multidetector CT imaging of the chest was performed during intravenous contrast administration.  CONTRAST:  35mL OMNIPAQUE IOHEXOL 300 MG/ML  SOLN  COMPARISON:  11/30/2018, 05/29/2018 and 05/25/2017.  FINDINGS: Cardiovascular: Atherosclerotic calcification of the aorta and coronary arteries.  Heart size normal. No pericardial effusion.  Mediastinum/Nodes: No pathologically enlarged mediastinal, hilar or axillary lymph nodes. Esophagus is unremarkable.  Lungs/Pleura: Bullous emphysema. A part solid nodule in the lateral segment right middle lobe collectively measures 1.4 x 1.6 cm (7/107), showing indolent growth from 05/25/2017, at which time it measured 1.2 x 1.3 cm. Currently, the internal solid nodular component measures 6 mm, increased from roughly 3 mm on 05/25/2017. This part solid nodule is seen within a focal bed of emphysema. Spiculated nodule in the medial aspect of the superior segment right lower lobe measures 8 mm (6 x 10 mm, 7/82), minimally enlarged from 11/30/2018, at which time it measured 6 mm (5 x 7 mm. Left upper lobectomy. Mild scarring in the anterolateral left lower lobe. No pleural fluid. Adherent debris in the lower trachea.  Upper Abdomen: Visualized portions of the liver, adrenal glands, kidneys, spleen, pancreas, stomach and bowel are grossly unremarkable. No upper abdominal adenopathy.  Musculoskeletal: No worrisome lytic or sclerotic lesions.  IMPRESSION: 1. Enlarging spiculated nodule in the superior segment right lower lobe, highly worrisome for adenocarcinoma. 2. Part solid nodule in the right middle lobe shows indolent growth from 05/25/2017 and is worrisome for low-grade adenocarcinoma. 3. Aortic atherosclerosis (ICD10-I70.0). Coronary artery calcification. 4.  Emphysema (ICD10-J43.9).   Electronically Signed   By: Lorin Picket M.D.   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  Unfortunately he has continued to smoke.  He now has 2 lung  nodules found on CT that have slowly grown over time.  These are highly suspicious for synchronous primary bronchogenic carcinomas.  Metastatic disease is also in the differential but neither of these lesions appears particularly classic for metastatic lesion.  When I saw him 3 weeks ago he was having significant rhonchi.  He was seen and evaluated by Dr. Lamonte Sakai who changed his inhalers.  He has had significant improvement.  Unfortunately he continues to smoke.  I discussed treatment options with Mr. Sine.  Options include stereotactic radiation and surgical resection.  We discussed the relative advantages and disadvantages of each approach.  He favored surgery over radiation.  I advised him that we would attempt to do a robotic resection with a right middle lobe wedge resection and a right lower lobe superior segmentectomy.  I think it will be very difficult to get a margin on the superior segment lesion with any type of wedge resection.  In order to facilitate surgery we will plan to do navigational bronchoscopy  to mark the lesions prior to the resection.  I informed him of the general nature of the procedure including the need for general anesthesia, the incisions to be used, the need for drainage tube postoperatively, the expected hospital stay, and the overall recovery.  I informed him of the indications, risk, benefits, and alternatives.  He understands the risk include, but not limited to death, MI, DVT, PE, bleeding, possible need for transfusion, infection, prolonged air leak, cardiac arrhythmias, as well as the possibility of other unstable complications.  He is willing to accept those risks and agrees to proceed.  Plan: Navigational bronchoscopy for marking followed by robotic right VATS for middle lobe wedge resection and lower lobe superior segmentectomy on Thursday, 07/26/2019.  Melrose Nakayama, MD [facility] [phone]

## 2019-07-17 NOTE — Progress Notes (Signed)
[address]       [place] [address]             [phone]     HPI: Mr. Reihl returns to discuss management of his 2 right lung nodules.   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  In 2016 he was found to have a squamous cell carcinoma of the left upper lobe.  He had neoadjuvant chemotherapy followed by left upper lobectomy.  He has been followed since that time.  In July 2020 had 2 small nodules in the right lung.  A follow-up CT showed slight increase in size of one of the nodules.  On PET CT right lower lobe superior segment nodule had an SUV of 2.2 and middle lobe cavitary nodule had an SUV of 1.2.  There was no hypermetabolic adenopathy.  I saw him in January.  We discussed possible surgical resection but he was having a lot of respiratory issues at that time.  I referred him to Dr. Lamonte Sakai.  Dr. Lamonte Sakai changed his inhalers.  He continues to smoke but says he is down to a few cigarettes a day.  He has noted significant improvement with his breathing and exercise tolerance with his new inhaler.  He is asking for a refill on that inhaler.  Past Medical History:  Diagnosis Date  . 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 Cataract   . Chronic cough   . COPD (chronic obstructive pulmonary disease) ([place])   . Dyspnea on exertion   . Full dentures   . Hx of adenomatous colonic polyps 06/07/2018  . Hyperlipidemia   . Non-small cell carcinoma of left lung [facility]) oncologist-- dr Julien Nordmann   dx Nov 2015left upper lobe, 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, N1, M0)  s/p  left upper lobectomy with node dissection and neoadjuvant systemic chemotherapy  . Productive cough    intermittantly  . Prostate cancer [facility]) urologist-- dr wrenn/  oncologsit-  dr Tammi Klippel   Stage T1c , Gleason 4+3, PSA 4.1, vol 26.73cc--   External RXT complete and Radiactive seed implants on 04-03-2015  . Stage II squamous cell carcinoma of left lung ([place]) 05/22/2014        Current Outpatient Medications  Medication Sig Dispense Refill  . albuterol (PROVENTIL HFA;VENTOLIN HFA) 108 (90 Base) MCG/ACT inhaler Inhale 1-2 puffs into the lungs every 6 (six) hours as needed for wheezing or shortness of breath.    Marland Kitchen amLODipine (NORVASC) 10 MG tablet TAKE 1 TABLET BY MOUTH EVERY DAY 90 tablet 1  . aspirin 81 MG chewable tablet Chew 1 tablet by mouth daily.    Marland Kitchen buPROPion (WELLBUTRIN SR) 100 MG 12 hr tablet TAKE 1 TABLET BY MOUTH TWICE A DAY 180 tablet 2  . rosuvastatin (CRESTOR) 40 MG tablet Take 1 tablet (40 mg total) by mouth daily. 30 tablet 5  . SYMBICORT 160-4.5 MCG/ACT inhaler INHALE 2 PUFFS INTO THE LUNGS 2 TIMES DAILY 10.2 Inhaler 11  . Tiotropium Bromide-Olodaterol (STIOLTO RESPIMAT) 2.5-2.5 MCG/ACT AERS Inhale 2 puffs into the lungs daily. 8 g 0   No current facility-administered medications for this visit.    Physical Exam BP (!) 145/74   Pulse 100   Temp 97.8 F (36.6 C) (Skin)   Resp 20   Ht 6' (1.829 m)   Wt 207 lb (  93.9 kg)   SpO2 94% Comment: RA  BMI 28.45 kg/m  69 year old man in no acute distress Alert and oriented x3 with no focal deficits Lungs diminished at left base with faint wheezing bilaterally Cardiac regular rate and rhythm, no murmur or gallop Abdomen soft nontender No clubbing cyanosis or edema  Diagnostic Tests: Pulmonary function testing FVC 2.73 (63%) FEV1 1.48 (45%) No change with bronchodilator TLC 6.74 (88%) RV 3.50 (135%) DLCO 18.08 (63%)  CT CHEST WITH CONTRAST  TECHNIQUE: Multidetector CT imaging of the chest was performed during intravenous contrast administration.  CONTRAST:  38mL OMNIPAQUE IOHEXOL 300 MG/ML  SOLN  COMPARISON:  11/30/2018, 05/29/2018 and 05/25/2017.  FINDINGS: Cardiovascular: Atherosclerotic calcification of the aorta and coronary arteries.  Heart size normal. No pericardial effusion.  Mediastinum/Nodes: No pathologically enlarged mediastinal, hilar or axillary lymph nodes. Esophagus is unremarkable.  Lungs/Pleura: Bullous emphysema. A part solid nodule in the lateral segment right middle lobe collectively measures 1.4 x 1.6 cm (7/107), showing indolent growth from 05/25/2017, at which time it measured 1.2 x 1.3 cm. Currently, the internal solid nodular component measures 6 mm, increased from roughly 3 mm on 05/25/2017. This part solid nodule is seen within a focal bed of emphysema. Spiculated nodule in the medial aspect of the superior segment right lower lobe measures 8 mm (6 x 10 mm, 7/82), minimally enlarged from 11/30/2018, at which time it measured 6 mm (5 x 7 mm. Left upper lobectomy. Mild scarring in the anterolateral left lower lobe. No pleural fluid. Adherent debris in the lower trachea.  Upper Abdomen: Visualized portions of the liver, adrenal glands, kidneys, spleen, pancreas, stomach and bowel are grossly unremarkable. No upper abdominal adenopathy.  Musculoskeletal: No worrisome lytic or sclerotic lesions.  IMPRESSION: 1. Enlarging spiculated nodule in the superior segment right lower lobe, highly worrisome for adenocarcinoma. 2. Part solid nodule in the right middle lobe shows indolent growth from 05/25/2017 and is worrisome for low-grade adenocarcinoma. 3. Aortic atherosclerosis (ICD10-I70.0). Coronary artery calcification. 4.  Emphysema (ICD10-J43.9).   Electronically Signed   By: Larry [name] M.D.   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  Unfortunately he has continued to smoke.  He now has 2 lung  nodules found on CT that have slowly grown over time.  These are highly suspicious for synchronous primary bronchogenic carcinomas.  Metastatic disease is also in the differential but neither of these lesions appears particularly classic for metastatic lesion.  When I saw him 3 weeks ago he was having significant rhonchi.  He was seen and evaluated by Dr. Lamonte Sakai who changed his inhalers.  He has had significant improvement.  Unfortunately he continues to smoke.  I discussed treatment options with Mr. Pompey.  Options include stereotactic radiation and surgical resection.  We discussed the relative advantages and disadvantages of each approach.  He favored surgery over radiation.  I advised him that we would attempt to do a robotic resection with a right middle lobe wedge resection and a right lower lobe superior segmentectomy.  I think it will be very difficult to get a margin on the superior segment lesion with any type of wedge resection.  In order to facilitate surgery we will plan to do navigational bronchoscopy  to mark the lesions prior to the resection.  I informed him of the general nature of the procedure including the need for general anesthesia, the incisions to be used, the need for drainage tube postoperatively, the expected hospital stay, and the overall recovery.  I informed him of the indications, risk, benefits, and alternatives.  He understands the risk include, but not limited to death, MI, DVT, PE, bleeding, possible need for transfusion, infection, prolonged air leak, cardiac arrhythmias, as well as the possibility of other unstable complications.  He is willing to accept those risks and agrees to proceed.  Plan: Navigational bronchoscopy for marking followed by robotic right VATS for middle lobe wedge resection and lower lobe superior segmentectomy on Thursday, 07/26/2019.  Melrose Nakayama, MD [facility] [phone]

## 2019-07-23 NOTE — Progress Notes (Signed)
[id number] [place], [place] [address] [place] [address] Phone: [phone] Fax: [phone]      Your procedure is scheduled on  Thursday, Jul 26, 2019.  Report to [facility] Main Entrance "A" at 06:00 A.M., and check in at the Admitting office.  Call this number if you have problems the morning of surgery:  [phone]  Call [phone] if you have any questions prior to your surgery date Monday-Friday 8am-4pm    Remember:  Do not eat or drink after midnight the night before your surgery   Take these medicines the morning of surgery with A SIP OF WATER: albuterol (PROVENTIL HFA;VENTOLIN HFA) - as needed amLODipine (NORVASC)  buPROPion (WELLBUTRIN SR)  rosuvastatin (CRESTOR) Tiotropium Bromide-Olodaterol (STIOLTO RESPIMAT)  **Please bring all inhalers with you the day of surgery.   **Follow your surgeon's instructions on when to stop Aspirin.  If no instructions were given by your surgeon then you will need to call the office to get those instructions.    7 days prior to surgery STOP taking any Aleve, Naproxen, Ibuprofen, Motrin, Advil, Goody's, BC's, all herbal medications, fish oil, and all vitamins.    The Morning of Surgery  Do not wear jewelry, make-up or nail polish.  Do not wear lotions, powders, colognes, or deodorant  Men may shave face and neck.  Do not bring valuables to the hospital.  [facility] is not responsible for any belongings or valuables.  If you are a smoker, DO NOT Smoke 24 hours prior to surgery  If you wear a CPAP at night please bring your mask the morning of surgery   Remember that you must have someone to transport you home after your surgery, and remain with you for 24 hours if you are discharged the same day.   Please bring cases for contacts, glasses, hearing aids, dentures or bridgework because it cannot be worn into surgery.    Leave your  suitcase in the car.  After surgery it may be brought to your room.  For patients admitted to the hospital, discharge time will be determined by your treatment team.  Patients discharged the day of surgery will not be allowed to drive home.    Special instructions:   [facility]- Preparing For Surgery  Before surgery, you can play an important role. Because skin is not sterile, your skin needs to be as free of germs as possible. You can reduce the number of germs on your skin by washing with CHG (chlorahexidine gluconate) Soap before surgery.  CHG is an antiseptic cleaner which kills germs and bonds with the skin to continue killing germs even after washing.    Oral Hygiene is also important to reduce your risk of infection.  Remember - BRUSH YOUR TEETH THE MORNING OF SURGERY WITH YOUR REGULAR TOOTHPASTE  Please do not use if you have an allergy to CHG or antibacterial soaps. If your skin becomes reddened/irritated stop using the CHG.  Do not shave (including legs and underarms) for at least 48 hours prior to first CHG shower. It is OK to shave your face.  Please follow these instructions carefully.   1. Shower the NIGHT BEFORE SURGERY and the MORNING OF SURGERY with CHG Soap.   2. If you chose to wash your hair, wash your hair first as usual with your normal shampoo.  3. After you shampoo, rinse your hair and body thoroughly to remove the  shampoo.  4. Use CHG as you would any other liquid soap. You can apply CHG directly to the skin and wash gently with a scrungie or a clean washcloth.   5. Apply the CHG Soap to your body ONLY FROM THE NECK DOWN.  Do not use on open wounds or open sores. Avoid contact with your eyes, ears, mouth and genitals (private parts). Wash Face and genitals (private parts)  with your normal soap.   6. Wash thoroughly, paying special attention to the area where your surgery will be performed.  7. Thoroughly rinse your body with warm water from the neck  down.  8. DO NOT shower/wash with your normal soap after using and rinsing off the CHG Soap.  9. Pat yourself dry with a CLEAN TOWEL.  10. Wear CLEAN PAJAMAS to bed the night before surgery, wear comfortable clothes the morning of surgery  11. Place CLEAN SHEETS on your bed the night of your first shower and DO NOT SLEEP WITH PETS.    Day of Surgery:  Please shower the morning of surgery with the CHG soap Do not apply any deodorants/lotions. Please wear clean clothes to the hospital/surgery center.   Remember to brush your teeth WITH YOUR REGULAR TOOTHPASTE.   Please read over the following fact sheets that you were given.

## 2019-07-24 ENCOUNTER — Ambulatory Visit (HOSPITAL_COMMUNITY)
Admission: RE | Admit: 2019-07-24 | Discharge: 2019-07-24 | Disposition: A | Discharge status: Home / Self Care | Payer: Medicare Other | Source: Ambulatory Visit | Attending: Thoracic Surgery (Cardiothoracic Vascular Surgery) | Admitting: Thoracic Surgery (Cardiothoracic Vascular Surgery)

## 2019-07-24 ENCOUNTER — Other Ambulatory Visit: Payer: Self-pay

## 2019-07-24 ENCOUNTER — Encounter (HOSPITAL_COMMUNITY)
Admission: RE | Admit: 2019-07-24 | Discharge: 2019-07-24 | Disposition: A | Discharge status: Home / Self Care | Payer: Medicare Other | Source: Ambulatory Visit | Attending: Thoracic Surgery (Cardiothoracic Vascular Surgery) | Admitting: Thoracic Surgery (Cardiothoracic Vascular Surgery)

## 2019-07-24 ENCOUNTER — Encounter (HOSPITAL_COMMUNITY): Payer: Self-pay

## 2019-07-24 ENCOUNTER — Other Ambulatory Visit (HOSPITAL_COMMUNITY)
Admission: RE | Admit: 2019-07-24 | Discharge: 2019-07-24 | Disposition: A | Discharge status: Home / Self Care | Payer: Medicare Other | Source: Ambulatory Visit | Attending: Thoracic Surgery (Cardiothoracic Vascular Surgery) | Admitting: Thoracic Surgery (Cardiothoracic Vascular Surgery)

## 2019-07-24 DIAGNOSIS — Z20822 Contact with and (suspected) exposure to covid-19: Secondary | ICD-10-CM | POA: Insufficient documentation

## 2019-07-24 DIAGNOSIS — R918 Other nonspecific abnormal finding of lung field: Secondary | ICD-10-CM

## 2019-07-24 LAB — CBC
HCT: 31.6 % — ABNORMAL LOW (ref 39.0–52.0)
Hemoglobin: 10.4 g/dL — ABNORMAL LOW (ref 13.0–17.0)
MCH: 25.6 pg — ABNORMAL LOW (ref 26.0–34.0)
MCHC: 32.9 g/dL (ref 30.0–36.0)
MCV: 77.6 fL — ABNORMAL LOW (ref 80.0–100.0)
Platelets: 274 10*3/uL (ref 150–400)
RBC: 4.07 MIL/uL — ABNORMAL LOW (ref 4.22–5.81)
RDW: 16.8 % — ABNORMAL HIGH (ref 11.5–15.5)
WBC: 7.6 10*3/uL (ref 4.0–10.5)
nRBC: 0 % (ref 0.0–0.2)

## 2019-07-24 LAB — URINALYSIS, ROUTINE W REFLEX MICROSCOPIC
Bilirubin Urine: NEGATIVE
Glucose, UA: NEGATIVE mg/dL
Hgb urine dipstick: NEGATIVE
Ketones, ur: NEGATIVE mg/dL
Leukocytes,Ua: NEGATIVE
Nitrite: NEGATIVE
Protein, ur: NEGATIVE mg/dL
Specific Gravity, Urine: 1.01 (ref 1.005–1.030)
pH: 6 (ref 5.0–8.0)

## 2019-07-24 LAB — BLOOD GAS, ARTERIAL
Acid-base deficit: 1.5 mmol/L (ref 0.0–2.0)
Bicarbonate: 22.5 mmol/L (ref 20.0–28.0)
FIO2: 21
O2 Saturation: 97.9 %
Patient temperature: 37
pCO2 arterial: 36.3 mmHg (ref 32.0–48.0)
pH, Arterial: 7.408 (ref 7.350–7.450)
pO2, Arterial: 102 mmHg (ref 83.0–108.0)

## 2019-07-24 LAB — COMPREHENSIVE METABOLIC PANEL
ALT: 21 U/L (ref 0–44)
AST: 23 U/L (ref 15–41)
Albumin: 3.8 g/dL (ref 3.5–5.0)
Alkaline Phosphatase: 69 U/L (ref 38–126)
Anion gap: 11 (ref 5–15)
BUN: 9 mg/dL (ref 8–23)
CO2: 20 mmol/L — ABNORMAL LOW (ref 22–32)
Calcium: 9.1 mg/dL (ref 8.9–10.3)
Chloride: 106 mmol/L (ref 98–111)
Creatinine, Ser: 0.76 mg/dL (ref 0.61–1.24)
GFR calc Af Amer: >60 mL/min (ref >60)
GFR calc non Af Amer: >60 mL/min (ref >60)
Glucose, Bld: 134 mg/dL — ABNORMAL HIGH (ref 70–99)
Potassium: 3.8 mmol/L (ref 3.5–5.1)
Sodium: 137 mmol/L (ref 135–145)
Total Bilirubin: 0.3 mg/dL (ref 0.3–1.2)
Total Protein: 7.2 g/dL (ref 6.5–8.1)

## 2019-07-24 LAB — APTT: aPTT: 28 seconds (ref 24–36)

## 2019-07-24 LAB — PROTIME-INR
INR: 1 (ref 0.8–1.2)
Prothrombin Time: 12.8 seconds (ref 11.4–15.2)

## 2019-07-24 LAB — SURGICAL PCR SCREEN
MRSA, PCR: NEGATIVE
Staphylococcus aureus: NEGATIVE

## 2019-07-24 LAB — SARS CORONAVIRUS 2 (TAT 6-24 HRS): SARS Coronavirus 2: NEGATIVE

## 2019-07-24 NOTE — Progress Notes (Signed)
PCP - [name], NP Cardiologist - denies  Chest x-ray - 07/24/2019 EKG - 07/24/2019 Stress Test - denies ECHO - 03/20/2019 Cardiac Cath - denies  Sleep Study - denies CPAP - N/A  Blood Thinner Instructions: N/A Aspirin Instructions: Continue taking as prescribed, not on day of surgery. Call surgeon's office for clarification  ERAS Protcol - No  COVID TEST- Scheduled for today after PAT appointment 07/24/2019. Patient verbalized understanding of self-quarantine instructions, appointment time and place.  Anesthesia review: YES, abnormal EKG  Patient stated became SOB from walking from into the hospital to PAT appointment.  Patient denies fever, cough and chest pain at PAT appointment  All instructions explained to the patient, with a verbal understanding of the material. Patient agrees to go over the instructions while at home for a better understanding. Patient also instructed to self quarantine after being tested for COVID-19. The opportunity to ask questions was provided.

## 2019-07-25 NOTE — Anesthesia Preprocedure Evaluation (Addendum)
Anesthesia Evaluation  Patient identified by MRN, date of birth, ID band Patient awake    Reviewed: Allergy & Precautions, NPO status , Patient's Chart, lab work & pertinent test results  Airway Mallampati: III  TM Distance: >3 FB Neck ROM: Full    Dental  (+) Edentulous Upper, Edentulous Lower   Pulmonary COPD,  COPD inhaler, Current SmokerPatient did not abstain from smoking.,    Pulmonary exam normal breath sounds clear to auscultation       Cardiovascular hypertension, Pt. on medications + CAD  Normal cardiovascular exam Rhythm:Regular Rate:Normal  ECG: NSR, rate 65  Echocardiogram 03/20/2019:  Left ventricle cavity is normal in size. Moderate concentric hypertrophy of the left ventricle. Normal LV systolic function with EF 55%. Normal  global wall motion. Doppler evidence of grade I (impaired) diastolic dysfunction, normal LAP.  Left atrial cavity is normal in size. Aneurysmal interatrial septum without 2D or color Doppler evidence of interatrial shunt.  Mild tricuspid regurgitation. Estimated pulmonary artery systolic pressure is 24 mmHg.    Neuro/Psych negative neurological ROS  negative psych ROS   GI/Hepatic negative GI ROS, Neg liver ROS,   Endo/Other  negative endocrine ROS  Renal/GU negative Renal ROS     Musculoskeletal negative musculoskeletal ROS (+)   Abdominal   Peds  Hematology  (+) anemia , HLD   Anesthesia Other Findings RML AND RLL NODULES  Reproductive/Obstetrics                           Anesthesia Physical Anesthesia Plan  ASA: III  Anesthesia Plan: General   Post-op Pain Management:    Induction: Intravenous  PONV Risk Score and Plan: 1 and Ondansetron, Dexamethasone, Treatment may vary due to age or medical condition and Midazolam  Airway Management Planned: Double Lumen EBT  Additional Equipment: Arterial line, CVP and Ultrasound Guidance Line  Placement  Intra-op Plan:   Post-operative Plan: Possible Post-op intubation/ventilation  Informed Consent: I have reviewed the patients History and Physical, chart, labs and discussed the procedure including the risks, benefits and alternatives for the proposed anesthesia with the patient or authorized representative who has indicated his/her understanding and acceptance.       Plan Discussed with: CRNA  Anesthesia Plan Comments: (Per PA-C: Hx of COPD, Lung CA s/p neoadjuvant chemotherapy followed by left upper lobectomy  (2016). Severe obstruction noted on recent PFTs. Last seen by Dr. Lamonte Sakai 07/02/19. Per OV note, "Very severe obstruction noted on his PFT from today with hyperinflation.  That being said his FEV1 is still greater than 1 L and he may be able to tolerate surgical intervention.  His clinical symptoms do not seem to be limiting him significantly unless he is minimizing with me.  He does not have frequent exacerbations, and I am unable to establish that he has significant bronchitic component with mucus production, so I think it would be reasonable to change his Symbicort to Stiolto to see if we can optimize his pulmonary function (add LAMA, but lose ICS).  We will also ensure that he does not desaturate with ambulation today.  Once we have tried him on optimal BD therapy plan to to discuss with Dr. Roxan Hockey.  Clearly he is marginal for resection."  Follows with cardiology for hx of CAD and AAA. Last seen by Dr. Virgina Jock 03/30/19. Per note, "CAD: Seen on CT chest. He is asymptomatic at this time. Recommend aggressive medical management. Continue Aspirin 81 mg daily. Currently on pravastatin  80 mg. Recommended to check lipid panel and reassess lipid lowering therapy. However, he has not checked lipid panel. Continue pravastatin for now. AAA: <3 cm. Repeat aorta duplex in 5 years."  Preop labs reviewed. Mild anemia Hgb 10.4. Otherwise unremarkable.   EKG 07/24/19: Normal sinus  rhythm. Rate 65. Septal infarct , age undetermined  CHEST - 2 VIEW 07/24/19:  COMPARISON:  Chest radiographs 11/04/2015, chest CT 06/04/2019 and CT 06/18/2019  FINDINGS: The heart size and mediastinal contours are stable status post left thoracotomy and upper lobe resection. Emphysematous changes are present in both lungs. The known right lung nodules are not well visualized. There is no superimposed airspace disease, pleural effusion or pneumothorax. The bones appear unremarkable.  IMPRESSION: Stable postoperative chest with emphysema. No acute cardiopulmonary process.  PFT 07/02/2019: FVC-%Pred-Pre Latest Units: % 63 FEV1-%Pred-Pre Latest Units: % 45 FEV1FVC-%Pred-Pre Latest Units: % 70 TLC % pred Latest Units: % 88 DLCO cor % pred Latest Units: % 63   Echocardiogram 03/20/2019:  Left ventricle cavity is normal in size. Moderate concentric hypertrophy  of the left ventricle. Normal LV systolic function with EF 55%. Normal  global wall motion. Doppler evidence of grade I (impaired) diastolic  dysfunction, normal LAP.  Left atrial cavity is normal in size. Aneurysmal interatrial septum  without 2D or color Doppler evidence of interatrial shunt.  Mild tricuspid regurgitation. Estimated pulmonary artery systolic pressure  is 24 mmHg.   )      Anesthesia Quick Evaluation

## 2019-07-25 NOTE — Progress Notes (Signed)
Anesthesia Chart Review:  Hx of COPD, Lung CA s/p neoadjuvant chemotherapy followed by left upper lobectomy  (2016). Severe obstruction noted on recent PFTs. Last seen by Dr. Lamonte Sakai 07/02/19. Per OV note, "Very severe obstruction noted on his PFT from today with hyperinflation.  That being said his FEV1 is still greater than 1 L and he may be able to tolerate surgical intervention.  His clinical symptoms do not seem to be limiting him significantly unless he is minimizing with me.  He does not have frequent exacerbations, and I am unable to establish that he has significant bronchitic component with mucus production, so I think it would be reasonable to change his Symbicort to Stiolto to see if we can optimize his pulmonary function (add LAMA, but lose ICS).  We will also ensure that he does not desaturate with ambulation today.  Once we have tried him on optimal BD therapy plan to to discuss with Dr. Roxan Hockey.  Clearly he is marginal for resection."  Follows with cardiology for hx of CAD and AAA. Last seen by Dr. Virgina Jock 03/30/19. Per note, "CAD: Seen on CT chest. He is asymptomatic at this time. Recommend aggressive medical management. Continue Aspirin 81 mg daily. Currently on pravastatin 80 mg. Recommended to check lipid panel and reassess lipid lowering therapy. However, he has not checked lipid panel. Continue pravastatin for now. AAA: <3 cm. Repeat aorta duplex in 5 years."  Preop labs reviewed. Mild anemia Hgb 10.4. Otherwise unremarkable.   EKG 07/24/19: Normal sinus rhythm. Rate 65. Septal infarct , age undetermined  CHEST - 2 VIEW 07/24/19:  COMPARISON:  Chest radiographs 11/04/2015, chest CT 06/04/2019 and CT 06/18/2019  FINDINGS: The heart size and mediastinal contours are stable status post left thoracotomy and upper lobe resection. Emphysematous changes are present in both lungs. The known right lung nodules are not well visualized. There is no superimposed airspace disease,  pleural effusion or pneumothorax. The bones appear unremarkable.  IMPRESSION: Stable postoperative chest with emphysema. No acute cardiopulmonary process.  PFT 07/02/2019: FVC-%Pred-Pre Latest Units: % 63  FEV1-%Pred-Pre Latest Units: % 45  FEV1FVC-%Pred-Pre Latest Units: % 70  TLC % pred Latest Units: % 88  DLCO cor % pred Latest Units: % 63    Echocardiogram 03/20/2019:  Left ventricle cavity is normal in size. Moderate concentric hypertrophy  of the left ventricle. Normal LV systolic function with EF 55%. Normal  global wall motion. Doppler evidence of grade I (impaired) diastolic  dysfunction, normal LAP.  Left atrial cavity is normal in size. Aneurysmal interatrial septum  without 2D or color Doppler evidence of interatrial shunt.  Mild tricuspid regurgitation. Estimated pulmonary artery systolic pressure  is 24 mmHg.    Wynonia Musty [facility] Short Stay Center/Anesthesiology Phone [phone] 07/25/2019 9:21 AM

## 2019-07-26 ENCOUNTER — Encounter (HOSPITAL_COMMUNITY): Payer: Self-pay | Admitting: Thoracic Surgery (Cardiothoracic Vascular Surgery)

## 2019-07-26 ENCOUNTER — Encounter (HOSPITAL_COMMUNITY)
Admission: RE | Disposition: A | Discharge status: Home Health | Payer: Self-pay | Source: Home / Self Care | Attending: Thoracic Surgery (Cardiothoracic Vascular Surgery)

## 2019-07-26 ENCOUNTER — Other Ambulatory Visit: Payer: Self-pay

## 2019-07-26 ENCOUNTER — Inpatient Hospital Stay (HOSPITAL_COMMUNITY): Payer: Medicare Other

## 2019-07-26 ENCOUNTER — Inpatient Hospital Stay (HOSPITAL_COMMUNITY)
Admission: RE | Admit: 2019-07-26 | Discharge: 2019-08-08 | DRG: 164 | Disposition: A | Discharge status: Home Health | Payer: Medicare Other | Attending: Thoracic Surgery (Cardiothoracic Vascular Surgery) | Admitting: Thoracic Surgery (Cardiothoracic Vascular Surgery)

## 2019-07-26 ENCOUNTER — Inpatient Hospital Stay (HOSPITAL_COMMUNITY): Payer: Medicare Other | Admitting: Physician Assistant

## 2019-07-26 ENCOUNTER — Inpatient Hospital Stay (HOSPITAL_COMMUNITY): Payer: Medicare Other | Admitting: Certified Registered"

## 2019-07-26 DIAGNOSIS — I10 Essential (primary) hypertension: Secondary | ICD-10-CM | POA: Diagnosis present

## 2019-07-26 DIAGNOSIS — Z8719 Personal history of other diseases of the digestive system: Secondary | ICD-10-CM | POA: Diagnosis not present

## 2019-07-26 DIAGNOSIS — Z902 Acquired absence of lung [part of]: Secondary | ICD-10-CM | POA: Diagnosis not present

## 2019-07-26 DIAGNOSIS — J9382 Other air leak: Secondary | ICD-10-CM | POA: Diagnosis not present

## 2019-07-26 DIAGNOSIS — Z86010 Personal history of colonic polyps: Secondary | ICD-10-CM | POA: Diagnosis not present

## 2019-07-26 DIAGNOSIS — I700 Atherosclerosis of aorta: Secondary | ICD-10-CM | POA: Diagnosis present

## 2019-07-26 DIAGNOSIS — C342 Malignant neoplasm of middle lobe, bronchus or lung: Secondary | ICD-10-CM | POA: Diagnosis present

## 2019-07-26 DIAGNOSIS — C3431 Malignant neoplasm of lower lobe, right bronchus or lung: Secondary | ICD-10-CM | POA: Diagnosis present

## 2019-07-26 DIAGNOSIS — J939 Pneumothorax, unspecified: Secondary | ICD-10-CM | POA: Diagnosis not present

## 2019-07-26 DIAGNOSIS — Z7951 Long term (current) use of inhaled steroids: Secondary | ICD-10-CM | POA: Diagnosis not present

## 2019-07-26 DIAGNOSIS — Z923 Personal history of irradiation: Secondary | ICD-10-CM

## 2019-07-26 DIAGNOSIS — Z419 Encounter for procedure for purposes other than remedying health state, unspecified: Secondary | ICD-10-CM

## 2019-07-26 DIAGNOSIS — Z20822 Contact with and (suspected) exposure to covid-19: Secondary | ICD-10-CM | POA: Diagnosis present

## 2019-07-26 DIAGNOSIS — J439 Emphysema, unspecified: Secondary | ICD-10-CM | POA: Diagnosis present

## 2019-07-26 DIAGNOSIS — E782 Mixed hyperlipidemia: Secondary | ICD-10-CM | POA: Diagnosis present

## 2019-07-26 DIAGNOSIS — I714 Abdominal aortic aneurysm, without rupture: Secondary | ICD-10-CM | POA: Diagnosis present

## 2019-07-26 DIAGNOSIS — J9311 Primary spontaneous pneumothorax: Secondary | ICD-10-CM | POA: Diagnosis not present

## 2019-07-26 DIAGNOSIS — K5900 Constipation, unspecified: Secondary | ICD-10-CM | POA: Diagnosis not present

## 2019-07-26 DIAGNOSIS — Z85118 Personal history of other malignant neoplasm of bronchus and lung: Secondary | ICD-10-CM | POA: Diagnosis not present

## 2019-07-26 DIAGNOSIS — Z8546 Personal history of malignant neoplasm of prostate: Secondary | ICD-10-CM

## 2019-07-26 DIAGNOSIS — Z9221 Personal history of antineoplastic chemotherapy: Secondary | ICD-10-CM

## 2019-07-26 DIAGNOSIS — Z9689 Presence of other specified functional implants: Secondary | ICD-10-CM

## 2019-07-26 DIAGNOSIS — H269 Unspecified cataract: Secondary | ICD-10-CM | POA: Diagnosis present

## 2019-07-26 DIAGNOSIS — Z7982 Long term (current) use of aspirin: Secondary | ICD-10-CM | POA: Diagnosis not present

## 2019-07-26 DIAGNOSIS — R918 Other nonspecific abnormal finding of lung field: Secondary | ICD-10-CM | POA: Diagnosis present

## 2019-07-26 DIAGNOSIS — R0902 Hypoxemia: Secondary | ICD-10-CM | POA: Diagnosis not present

## 2019-07-26 DIAGNOSIS — Z09 Encounter for follow-up examination after completed treatment for conditions other than malignant neoplasm: Secondary | ICD-10-CM

## 2019-07-26 DIAGNOSIS — F17210 Nicotine dependence, cigarettes, uncomplicated: Secondary | ICD-10-CM | POA: Diagnosis present

## 2019-07-26 DIAGNOSIS — I2510 Atherosclerotic heart disease of native coronary artery without angina pectoris: Secondary | ICD-10-CM | POA: Diagnosis present

## 2019-07-26 DIAGNOSIS — Z79899 Other long term (current) drug therapy: Secondary | ICD-10-CM

## 2019-07-26 HISTORY — PX: BRONCHOSCOPY: SUR163

## 2019-07-26 HISTORY — PX: VIDEO BRONCHOSCOPY WITH ENDOBRONCHIAL NAVIGATION: SHX6175

## 2019-07-26 HISTORY — PX: NODE DISSECTION: SHX5269

## 2019-07-26 HISTORY — PX: WEDGE RESECTION: SHX5070

## 2019-07-26 HISTORY — PX: INTERCOSTAL NERVE BLOCK: SHX5021

## 2019-07-26 LAB — POCT I-STAT 7, (LYTES, BLD GAS, ICA,H+H)
Acid-Base Excess: 1 mmol/L (ref 0.0–2.0)
Acid-base deficit: 4 mmol/L — ABNORMAL HIGH (ref 0.0–2.0)
Acid-base deficit: 3 mmol/L — ABNORMAL HIGH (ref 0.0–2.0)
Bicarbonate: 25.4 mmol/L (ref 20.0–28.0)
Bicarbonate: 25.8 mmol/L (ref 20.0–28.0)
Bicarbonate: 28.6 mmol/L — ABNORMAL HIGH (ref 20.0–28.0)
Calcium, Ion: 1.27 mmol/L (ref 1.15–1.40)
Calcium, Ion: 1.24 mmol/L (ref 1.15–1.40)
Calcium, Ion: 1.2 mmol/L (ref 1.15–1.40)
HCT: 31 % — ABNORMAL LOW (ref 39.0–52.0)
HCT: 31 % — ABNORMAL LOW (ref 39.0–52.0)
HCT: 29 % — ABNORMAL LOW (ref 39.0–52.0)
Hemoglobin: 10.5 g/dL — ABNORMAL LOW (ref 13.0–17.0)
Hemoglobin: 10.5 g/dL — ABNORMAL LOW (ref 13.0–17.0)
Hemoglobin: 9.9 g/dL — ABNORMAL LOW (ref 13.0–17.0)
O2 Saturation: 87 %
O2 Saturation: 89 %
O2 Saturation: 100 %
Potassium: 4.7 mmol/L (ref 3.5–5.1)
Potassium: 5 mmol/L (ref 3.5–5.1)
Potassium: 5 mmol/L (ref 3.5–5.1)
Sodium: 140 mmol/L (ref 135–145)
Sodium: 139 mmol/L (ref 135–145)
Sodium: 139 mmol/L (ref 135–145)
TCO2: 27 mmol/L (ref 22–32)
TCO2: 28 mmol/L (ref 22–32)
TCO2: 30 mmol/L (ref 22–32)
pCO2 arterial: 68.3 mmHg (ref 32.0–48.0)
pCO2 arterial: 62.7 mmHg — ABNORMAL HIGH (ref 32.0–48.0)
pCO2 arterial: 58.2 mmHg — ABNORMAL HIGH (ref 32.0–48.0)
pH, Arterial: 7.179 — CL (ref 7.350–7.450)
pH, Arterial: 7.223 — ABNORMAL LOW (ref 7.350–7.450)
pH, Arterial: 7.3 — ABNORMAL LOW (ref 7.350–7.450)
pO2, Arterial: 69 mmHg — ABNORMAL LOW (ref 83.0–108.0)
pO2, Arterial: 68 mmHg — ABNORMAL LOW (ref 83.0–108.0)
pO2, Arterial: 541 mmHg — ABNORMAL HIGH (ref 83.0–108.0)

## 2019-07-26 LAB — PREPARE RBC (CROSSMATCH)

## 2019-07-26 SURGERY — VIDEO BRONCHOSCOPY WITH ENDOBRONCHIAL NAVIGATION
Anesthesia: General | Site: Chest | Laterality: Right

## 2019-07-26 MED ORDER — DEXMEDETOMIDINE HCL 200 MCG/2ML IV SOLN
INTRAVENOUS | Status: DC | PRN
  Administered 2019-07-26: 4 ug via INTRAVENOUS

## 2019-07-26 MED ORDER — RACEPINEPHRINE HCL 2.25 % IN NEBU
INHALATION_SOLUTION | RESPIRATORY_TRACT | Status: AC
  Filled 2019-07-26: qty 0.5

## 2019-07-26 MED ORDER — FENTANYL CITRATE (PF) 100 MCG/2ML IJ SOLN: 25.0000 ug | INTRAMUSCULAR | Status: DC | PRN

## 2019-07-26 MED ORDER — CEFAZOLIN SODIUM-DEXTROSE 2-4 GM/100ML-% IV SOLN
2.0000 g | INTRAVENOUS | Status: AC
  Administered 2019-07-26 (×2): 2 g via INTRAVENOUS
  Filled 2019-07-26: qty 100

## 2019-07-26 MED ORDER — PHENYLEPHRINE 40 MCG/ML (10ML) SYRINGE FOR IV PUSH (FOR BLOOD PRESSURE SUPPORT)
PREFILLED_SYRINGE | INTRAVENOUS | Status: DC | PRN
  Administered 2019-07-26: 80 ug via INTRAVENOUS

## 2019-07-26 MED ORDER — FENTANYL CITRATE (PF) 250 MCG/5ML IJ SOLN
INTRAMUSCULAR | Status: AC
  Filled 2019-07-26: qty 5

## 2019-07-26 MED ORDER — BUPROPION HCL ER (SR) 100 MG PO TB12
100.0000 mg | ORAL_TABLET | Freq: Two times a day (BID) | ORAL | Status: DC
  Administered 2019-07-26 – 2019-08-08 (×26): 100 mg via ORAL
  Filled 2019-07-26 (×27): qty 1

## 2019-07-26 MED ORDER — MOMETASONE FURO-FORMOTEROL FUM 200-5 MCG/ACT IN AERO
2.0000 | INHALATION_SPRAY | Freq: Two times a day (BID) | RESPIRATORY_TRACT | Status: DC
  Administered 2019-07-26 – 2019-08-08 (×26): 2 via RESPIRATORY_TRACT
  Filled 2019-07-26: qty 8.8

## 2019-07-26 MED ORDER — AMLODIPINE BESYLATE 10 MG PO TABS
10.0000 mg | ORAL_TABLET | Freq: Every day | ORAL | Status: DC
  Administered 2019-07-27 – 2019-08-08 (×13): 10 mg via ORAL
  Filled 2019-07-26 (×13): qty 1

## 2019-07-26 MED ORDER — MIDAZOLAM HCL 2 MG/2ML IJ SOLN
INTRAMUSCULAR | Status: AC
  Filled 2019-07-26: qty 2

## 2019-07-26 MED ORDER — ESMOLOL HCL 100 MG/10ML IV SOLN
INTRAVENOUS | Status: AC
  Filled 2019-07-26: qty 10

## 2019-07-26 MED ORDER — CEFAZOLIN SODIUM-DEXTROSE 2-4 GM/100ML-% IV SOLN
2.0000 g | Freq: Three times a day (TID) | INTRAVENOUS | Status: AC
  Administered 2019-07-26 – 2019-07-27 (×2): 2 g via INTRAVENOUS
  Filled 2019-07-26 (×2): qty 100

## 2019-07-26 MED ORDER — SODIUM CHLORIDE 0.9% IV SOLUTION: Freq: Once | INTRAVENOUS | Status: DC

## 2019-07-26 MED ORDER — TRAMADOL HCL 50 MG PO TABS: 50.0000 mg | ORAL_TABLET | Freq: Four times a day (QID) | ORAL | Status: DC | PRN

## 2019-07-26 MED ORDER — ENOXAPARIN SODIUM 40 MG/0.4ML ~~LOC~~ SOLN
40.0000 mg | Freq: Two times a day (BID) | SUBCUTANEOUS | Status: DC
  Administered 2019-07-27: 40 mg via SUBCUTANEOUS
  Filled 2019-07-26: qty 0.4

## 2019-07-26 MED ORDER — BUPIVACAINE LIPOSOME 1.3 % IJ SUSP
20.0000 mL | Freq: Once | INTRAMUSCULAR | Status: DC
  Filled 2019-07-26: qty 20

## 2019-07-26 MED ORDER — INDOCYANINE GREEN 25 MG IV SOLR
INTRAVENOUS | Status: DC | PRN
  Administered 2019-07-26: 13:00:00 25 mg

## 2019-07-26 MED ORDER — LACTATED RINGERS IV SOLN: INTRAVENOUS | Status: DC

## 2019-07-26 MED ORDER — SODIUM CHLORIDE 0.9 % IV SOLN: INTRAVENOUS | Status: DC

## 2019-07-26 MED ORDER — METOCLOPRAMIDE HCL 5 MG/ML IJ SOLN
10.0000 mg | Freq: Four times a day (QID) | INTRAMUSCULAR | Status: AC
  Administered 2019-07-26 – 2019-07-27 (×4): 10 mg via INTRAVENOUS
  Filled 2019-07-26 (×4): qty 2

## 2019-07-26 MED ORDER — KETOROLAC TROMETHAMINE 15 MG/ML IJ SOLN: 15.0000 mg | Freq: Once | INTRAMUSCULAR | Status: DC | PRN

## 2019-07-26 MED ORDER — 0.9 % SODIUM CHLORIDE (POUR BTL) OPTIME
TOPICAL | Status: DC | PRN
  Administered 2019-07-26: 3000 mL

## 2019-07-26 MED ORDER — MIDAZOLAM HCL 5 MG/5ML IJ SOLN
INTRAMUSCULAR | Status: DC | PRN
  Administered 2019-07-26 (×2): 1 mg via INTRAVENOUS

## 2019-07-26 MED ORDER — ESMOLOL HCL 100 MG/10ML IV SOLN
INTRAVENOUS | Status: DC | PRN
  Administered 2019-07-26: 10 mg via INTRAVENOUS

## 2019-07-26 MED ORDER — PROPOFOL 10 MG/ML IV BOLUS
INTRAVENOUS | Status: DC | PRN
  Administered 2019-07-26: 200 mg via INTRAVENOUS
  Administered 2019-07-26: 100 mg via INTRAVENOUS

## 2019-07-26 MED ORDER — PHENYLEPHRINE HCL-NACL 10-0.9 MG/250ML-% IV SOLN
INTRAVENOUS | Status: DC | PRN
  Administered 2019-07-26: 20 ug/min via INTRAVENOUS

## 2019-07-26 MED ORDER — ACETAMINOPHEN 160 MG/5ML PO SOLN: 1000.0000 mg | Freq: Four times a day (QID) | ORAL | Status: AC

## 2019-07-26 MED ORDER — ONDANSETRON HCL 4 MG/2ML IJ SOLN: 4.0000 mg | Freq: Four times a day (QID) | INTRAMUSCULAR | Status: DC | PRN

## 2019-07-26 MED ORDER — ACETAMINOPHEN 500 MG PO TABS
1000.0000 mg | ORAL_TABLET | Freq: Once | ORAL | Status: AC
  Administered 2019-07-26: 1000 mg via ORAL
  Filled 2019-07-26: qty 2

## 2019-07-26 MED ORDER — SODIUM CHLORIDE 0.9 % IV SOLN: INTRAVENOUS | Status: DC | PRN

## 2019-07-26 MED ORDER — PROPOFOL 10 MG/ML IV BOLUS
INTRAVENOUS | Status: AC
  Filled 2019-07-26: qty 40

## 2019-07-26 MED ORDER — RACEPINEPHRINE HCL 2.25 % IN NEBU
0.5000 mL | INHALATION_SOLUTION | Freq: Once | RESPIRATORY_TRACT | Status: AC
  Administered 2019-07-26: 0.5 mL via RESPIRATORY_TRACT

## 2019-07-26 MED ORDER — IPRATROPIUM-ALBUTEROL 0.5-2.5 (3) MG/3ML IN SOLN
RESPIRATORY_TRACT | Status: AC
  Filled 2019-07-26: qty 3

## 2019-07-26 MED ORDER — ALBUTEROL SULFATE HFA 108 (90 BASE) MCG/ACT IN AERS
INHALATION_SPRAY | RESPIRATORY_TRACT | Status: DC | PRN
  Administered 2019-07-26: 2 via RESPIRATORY_TRACT
  Administered 2019-07-26: 4 via RESPIRATORY_TRACT

## 2019-07-26 MED ORDER — SODIUM CHLORIDE 0.9 % IR SOLN
Status: DC | PRN
  Administered 2019-07-26: 1000 mL

## 2019-07-26 MED ORDER — OXYCODONE HCL 5 MG PO TABS
5.0000 mg | ORAL_TABLET | ORAL | Status: DC | PRN
  Administered 2019-07-29: 10 mg via ORAL
  Filled 2019-07-26: qty 2

## 2019-07-26 MED ORDER — BUPIVACAINE HCL (PF) 0.5 % IJ SOLN
INTRAMUSCULAR | Status: AC
  Filled 2019-07-26: qty 30

## 2019-07-26 MED ORDER — GLYCOPYRROLATE PF 0.2 MG/ML IJ SOSY
PREFILLED_SYRINGE | INTRAMUSCULAR | Status: DC | PRN
  Administered 2019-07-26: .2 mg via INTRAVENOUS

## 2019-07-26 MED ORDER — ROCURONIUM BROMIDE 10 MG/ML (PF) SYRINGE
PREFILLED_SYRINGE | INTRAVENOUS | Status: DC | PRN
  Administered 2019-07-26: 60 mg via INTRAVENOUS
  Administered 2019-07-26: 40 mg via INTRAVENOUS
  Administered 2019-07-26 (×2): 50 mg via INTRAVENOUS

## 2019-07-26 MED ORDER — IPRATROPIUM-ALBUTEROL 0.5-2.5 (3) MG/3ML IN SOLN
3.0000 mL | Freq: Four times a day (QID) | RESPIRATORY_TRACT | Status: DC
  Administered 2019-07-26 – 2019-07-30 (×14): 3 mL via RESPIRATORY_TRACT
  Filled 2019-07-26 (×12): qty 3

## 2019-07-26 MED ORDER — ASPIRIN 81 MG PO CHEW
81.0000 mg | CHEWABLE_TABLET | Freq: Every day | ORAL | Status: DC
  Administered 2019-07-27 – 2019-08-08 (×13): 81 mg via ORAL
  Filled 2019-07-26 (×13): qty 1

## 2019-07-26 MED ORDER — LACTATED RINGERS IV SOLN: INTRAVENOUS | Status: DC | PRN

## 2019-07-26 MED ORDER — ONDANSETRON HCL 4 MG/2ML IJ SOLN: 4.0000 mg | Freq: Once | INTRAMUSCULAR | Status: DC | PRN

## 2019-07-26 MED ORDER — METHYLENE BLUE 0.5 % INJ SOLN
INTRAVENOUS | Status: DC | PRN
  Administered 2019-07-26: 10 mL via SUBMUCOSAL

## 2019-07-26 MED ORDER — BISACODYL 5 MG PO TBEC
10.0000 mg | DELAYED_RELEASE_TABLET | Freq: Every day | ORAL | Status: DC
  Administered 2019-07-27 – 2019-08-08 (×11): 10 mg via ORAL
  Filled 2019-07-26 (×13): qty 2

## 2019-07-26 MED ORDER — METHYLENE BLUE 0.5 % INJ SOLN
INTRAVENOUS | Status: AC
  Filled 2019-07-26: qty 10

## 2019-07-26 MED ORDER — EPINEPHRINE PF 1 MG/ML IJ SOLN
INTRAMUSCULAR | Status: AC
  Filled 2019-07-26: qty 1

## 2019-07-26 MED ORDER — DEXAMETHASONE SODIUM PHOSPHATE 10 MG/ML IJ SOLN
INTRAMUSCULAR | Status: DC | PRN
  Administered 2019-07-26: 10 mg via INTRAVENOUS

## 2019-07-26 MED ORDER — ACETAMINOPHEN 500 MG PO TABS
1000.0000 mg | ORAL_TABLET | Freq: Four times a day (QID) | ORAL | Status: AC
  Administered 2019-07-26 – 2019-07-30 (×13): 1000 mg via ORAL
  Filled 2019-07-26 (×16): qty 2

## 2019-07-26 MED ORDER — ROSUVASTATIN CALCIUM 20 MG PO TABS
40.0000 mg | ORAL_TABLET | Freq: Every day | ORAL | Status: DC
  Administered 2019-07-26 – 2019-08-08 (×14): 40 mg via ORAL
  Filled 2019-07-26 (×14): qty 2

## 2019-07-26 MED ORDER — SUGAMMADEX SODIUM 200 MG/2ML IV SOLN
INTRAVENOUS | Status: DC | PRN
  Administered 2019-07-26: 200 mg via INTRAVENOUS
  Administered 2019-07-26: 100 mg via INTRAVENOUS

## 2019-07-26 MED ORDER — SENNOSIDES-DOCUSATE SODIUM 8.6-50 MG PO TABS
1.0000 | ORAL_TABLET | Freq: Every day | ORAL | Status: DC
  Administered 2019-07-27 – 2019-08-07 (×12): 1 via ORAL
  Filled 2019-07-26 (×13): qty 1

## 2019-07-26 MED ORDER — ALBUMIN HUMAN 5 % IV SOLN: INTRAVENOUS | Status: DC | PRN

## 2019-07-26 MED ORDER — LIDOCAINE 2% (20 MG/ML) 5 ML SYRINGE
INTRAMUSCULAR | Status: DC | PRN
  Administered 2019-07-26: 60 mg via INTRAVENOUS

## 2019-07-26 MED ORDER — ONDANSETRON HCL 4 MG/2ML IJ SOLN
INTRAMUSCULAR | Status: DC | PRN
  Administered 2019-07-26: 4 mg via INTRAVENOUS

## 2019-07-26 MED ORDER — INDOCYANINE GREEN 25 MG IV SOLR
INTRAVENOUS | Status: AC
  Filled 2019-07-26: qty 10

## 2019-07-26 MED ORDER — FENTANYL CITRATE (PF) 100 MCG/2ML IJ SOLN
INTRAMUSCULAR | Status: DC | PRN
  Administered 2019-07-26: 50 ug via INTRAVENOUS
  Administered 2019-07-26: 150 ug via INTRAVENOUS
  Administered 2019-07-26: 50 ug via INTRAVENOUS
  Administered 2019-07-26 (×2): 100 ug via INTRAVENOUS
  Administered 2019-07-26: 50 ug via INTRAVENOUS

## 2019-07-26 MED ORDER — SODIUM CHLORIDE FLUSH 0.9 % IV SOLN
INTRAVENOUS | Status: DC | PRN
  Administered 2019-07-26: 100 mL

## 2019-07-26 MED ORDER — ALBUTEROL SULFATE (2.5 MG/3ML) 0.083% IN NEBU: 3.0000 mL | INHALATION_SOLUTION | Freq: Four times a day (QID) | RESPIRATORY_TRACT | Status: DC | PRN

## 2019-07-26 MED ORDER — FENTANYL CITRATE (PF) 100 MCG/2ML IJ SOLN
25.0000 ug | INTRAMUSCULAR | Status: DC | PRN
  Administered 2019-07-29: 50 ug via INTRAVENOUS
  Filled 2019-07-26: qty 2

## 2019-07-26 MED ORDER — CHLORHEXIDINE GLUCONATE CLOTH 2 % EX PADS
6.0000 | MEDICATED_PAD | Freq: Every day | CUTANEOUS | Status: DC
  Administered 2019-07-27 – 2019-08-02 (×7): 6 via TOPICAL

## 2019-07-26 MED ORDER — HEMOSTATIC AGENTS (NO CHARGE) OPTIME
TOPICAL | Status: DC | PRN
  Administered 2019-07-26: 3 via TOPICAL
  Administered 2019-07-26: 1 via TOPICAL

## 2019-07-26 SURGICAL SUPPLY — 179 items
ADAPTER BRONCHOSCOPE OLYMP 190 (ADAPTER) ×1 IMPLANT
ADAPTER BRONCHOSCOPE OLYMPUS (ADAPTER) ×4 IMPLANT
ADAPTER VALVE BIOPSY EBUS (MISCELLANEOUS) IMPLANT
ADH SKN CLS APL DERMABOND .7 (GAUZE/BANDAGES/DRESSINGS) ×3
ADH SKN CLS LQ APL DERMABOND (GAUZE/BANDAGES/DRESSINGS) ×3
ADPR BSCP OLMPS EDG (ADAPTER) ×3
ADPR BSCP STRL LF REUSE (ADAPTER) ×3
ADPTR VALVE BIOPSY EBUS (MISCELLANEOUS)
APPLIER CLIP ROT 10 11.4 M/L (STAPLE)
APR CLP MED LRG 11.4X10 (STAPLE)
BAG SPEC RTRVL 10 TROC 200 (ENDOMECHANICALS) ×3
BAG SPEC RTRVL LRG 6X4 10 (ENDOMECHANICALS)
BLADE CLIPPER SURG (BLADE) ×4 IMPLANT
BLADE SURG SZ11 CARB STEEL (BLADE) ×4 IMPLANT
BNDG COHESIVE 4X5 TAN STRL (GAUZE/BANDAGES/DRESSINGS) ×4 IMPLANT
BNDG COHESIVE 6X5 TAN STRL LF (GAUZE/BANDAGES/DRESSINGS) ×4 IMPLANT
BRUSH BIOPSY BRONCH 10 SDTNB (MISCELLANEOUS) IMPLANT
BRUSH SUPERTRAX BIOPSY (INSTRUMENTS) IMPLANT
BRUSH SUPERTRAX NDL-TIP CYTO (INSTRUMENTS) ×4 IMPLANT
CANISTER SUCT 3000ML PPV (MISCELLANEOUS) ×8 IMPLANT
CANNULA REDUC XI 12-8 STAPL (CANNULA) ×3
CANNULA REDUCER 12-8 DVNC XI (CANNULA) ×6 IMPLANT
CATH THORACIC 28FR (CATHETERS) IMPLANT
CATH THORACIC 28FR RT ANG (CATHETERS) IMPLANT
CATH THORACIC 36FR (CATHETERS) IMPLANT
CATH THORACIC 36FR RT ANG (CATHETERS) IMPLANT
CLIP APPLIE ROT 10 11.4 M/L (STAPLE) IMPLANT
CLIP VESOCCLUDE MED 6/CT (CLIP) IMPLANT
CNTNR URN SCR LID CUP LEK RST (MISCELLANEOUS) ×6 IMPLANT
CONN ST 1/4X3/8  BEN (MISCELLANEOUS) ×1
CONN ST 1/4X3/8 BEN (MISCELLANEOUS) IMPLANT
CONN Y 3/8X3/8X3/8  BEN (MISCELLANEOUS)
CONN Y 3/8X3/8X3/8 BEN (MISCELLANEOUS) IMPLANT
CONT SPEC 4OZ CLIKSEAL STRL BL (MISCELLANEOUS) ×15 IMPLANT
CONT SPEC 4OZ STRL OR WHT (MISCELLANEOUS) ×76
COVER BACK TABLE 60X90IN (DRAPES) ×4 IMPLANT
COVER SURGICAL LIGHT HANDLE (MISCELLANEOUS) IMPLANT
DEFOGGER SCOPE WARMER CLEARIFY (MISCELLANEOUS) ×4 IMPLANT
DERMABOND ADHESIVE PROPEN (GAUZE/BANDAGES/DRESSINGS) ×1
DERMABOND ADVANCED (GAUZE/BANDAGES/DRESSINGS) ×1
DERMABOND ADVANCED .7 DNX12 (GAUZE/BANDAGES/DRESSINGS) ×3 IMPLANT
DERMABOND ADVANCED .7 DNX6 (GAUZE/BANDAGES/DRESSINGS) IMPLANT
DRAIN CHANNEL 28F RND 3/8 FF (WOUND CARE) ×1 IMPLANT
DRAIN CHANNEL 32F RND 10.7 FF (WOUND CARE) IMPLANT
DRAPE ARM DVNC X/XI (DISPOSABLE) ×12 IMPLANT
DRAPE COLUMN DVNC XI (DISPOSABLE) ×3 IMPLANT
DRAPE CV SPLIT W-CLR ANES SCRN (DRAPES) ×3 IMPLANT
DRAPE DA VINCI XI ARM (DISPOSABLE) ×4
DRAPE DA VINCI XI COLUMN (DISPOSABLE) ×1
DRAPE INCISE IOBAN 66X45 STRL (DRAPES) IMPLANT
DRAPE ORTHO SPLIT 77X108 STRL (DRAPES) ×4
DRAPE SURG ORHT 6 SPLT 77X108 (DRAPES) ×3 IMPLANT
DRAPE WARM FLUID 44X44 (DRAPES) IMPLANT
ELECT BLADE 6.5 EXT (BLADE) IMPLANT
ELECT REM PT RETURN 9FT ADLT (ELECTROSURGICAL) ×4
ELECTRODE REM PT RTRN 9FT ADLT (ELECTROSURGICAL) ×3 IMPLANT
FILTER STRAW FLUID ASPIR (MISCELLANEOUS) ×1 IMPLANT
FORCEPS BIOP SUPERTRX PREMAR (INSTRUMENTS) IMPLANT
GAUZE KITTNER 1.5X5 (MISCELLANEOUS) ×1 IMPLANT
GAUZE KITTNER 4X10 (MISCELLANEOUS) ×1 IMPLANT
GAUZE KITTNER 4X8 (MISCELLANEOUS) ×7 IMPLANT
GAUZE SPONGE 4X4 12PLY STRL (GAUZE/BANDAGES/DRESSINGS) ×4 IMPLANT
GAUZE SPONGE 4X4 12PLY STRL LF (GAUZE/BANDAGES/DRESSINGS) ×1 IMPLANT
GLOVE BIO SURGEON STRL SZ7 (GLOVE) ×8 IMPLANT
GLOVE SURG SIGNA 7.5 PF LTX (GLOVE) ×8 IMPLANT
GLOVE TRIUMPH SURG SIZE 7.5 (KITS) ×8 IMPLANT
GOWN STRL REUS W/ TWL LRG LVL3 (GOWN DISPOSABLE) ×6 IMPLANT
GOWN STRL REUS W/ TWL XL LVL3 (GOWN DISPOSABLE) ×12 IMPLANT
GOWN STRL REUS W/TWL LRG LVL3 (GOWN DISPOSABLE) ×8
GOWN STRL REUS W/TWL XL LVL3 (GOWN DISPOSABLE) ×16
HEMOSTAT SURGICEL 2X14 (HEMOSTASIS) ×21 IMPLANT
IRRIGATION STRYKERFLOW (MISCELLANEOUS) IMPLANT
IRRIGATOR STRYKERFLOW (MISCELLANEOUS) ×4
IRRIGATOR SUCT 8 DISP DVNC XI (IRRIGATION / IRRIGATOR) IMPLANT
IRRIGATOR SUCTION 8MM XI DISP (IRRIGATION / IRRIGATOR)
KIT BASIN OR (CUSTOM PROCEDURE TRAY) ×4 IMPLANT
KIT CLEAN ENDO COMPLIANCE (KITS) ×4 IMPLANT
KIT ILLUMISITE 180 PROCEDURE (KITS) IMPLANT
KIT ILLUMISITE 90 PROCEDURE (KITS) IMPLANT
KIT SUCTION CATH 14FR (SUCTIONS) IMPLANT
KIT TURNOVER KIT B (KITS) ×4 IMPLANT
LOOP VESSEL SUPERMAXI WHITE (MISCELLANEOUS) IMPLANT
MARKER SKIN DUAL TIP RULER LAB (MISCELLANEOUS) ×5 IMPLANT
NDL BIOPSY TRANSBRONCH 21G (NEEDLE) IMPLANT
NDL HYPO 25GX1X1/2 BEV (NEEDLE) ×3 IMPLANT
NDL SPNL 22GX3.5 QUINCKE BK (NEEDLE) IMPLANT
NDL SUPERTRX PREMARK BIOPSY (NEEDLE) IMPLANT
NEEDLE BIOPSY TRANSBRONCH 21G (NEEDLE) ×4 IMPLANT
NEEDLE HYPO 25GX1X1/2 BEV (NEEDLE) ×4 IMPLANT
NEEDLE SPNL 22GX3.5 QUINCKE BK (NEEDLE) ×4 IMPLANT
NEEDLE SUPERTRX PREMARK BIOPSY (NEEDLE) IMPLANT
NS IRRIG 1000ML POUR BTL (IV SOLUTION) ×4 IMPLANT
OIL SILICONE PENTAX (PARTS (SERVICE/REPAIRS)) ×4 IMPLANT
PACK CHEST (CUSTOM PROCEDURE TRAY) ×4 IMPLANT
PACK UNIVERSAL I (CUSTOM PROCEDURE TRAY) ×1 IMPLANT
PAD ARMBOARD 7.5X6 YLW CONV (MISCELLANEOUS) ×8 IMPLANT
PATCHES PATIENT (LABEL) ×12 IMPLANT
PATTIES SURGICAL 3 X3 (GAUZE/BANDAGES/DRESSINGS)
PATTIES SURGICAL 3X3 (GAUZE/BANDAGES/DRESSINGS) IMPLANT
POUCH ENDO CATCH II 15MM (MISCELLANEOUS) IMPLANT
POUCH RETRIEVAL ECOSAC 10 (ENDOMECHANICALS) ×3 IMPLANT
POUCH RETRIEVAL ECOSAC 10MM (ENDOMECHANICALS) ×1
POUCH SPECIMEN RETRIEVAL 10MM (ENDOMECHANICALS) IMPLANT
RELOAD STAPLE 45 2.0 GRY DVNC (STAPLE) IMPLANT
RELOAD STAPLE 45 2.5 WHT DVNC (STAPLE) IMPLANT
RELOAD STAPLE 45 3.5 BLU DVNC (STAPLE) IMPLANT
RELOAD STAPLE 45 4.3 GRN DVNC (STAPLE) IMPLANT
RELOAD STAPLER 2.5X45 WHT DVNC (STAPLE) ×9 IMPLANT
RELOAD STAPLER 3.5X45 BLU DVNC (STAPLE) ×42 IMPLANT
RELOAD STAPLER 4.3X45 GRN DVNC (STAPLE) ×9 IMPLANT
SCISSORS LAP 5X35 DISP (ENDOMECHANICALS) IMPLANT
SEAL CANN UNIV 5-8 DVNC XI (MISCELLANEOUS) ×6 IMPLANT
SEAL XI 5MM-8MM UNIVERSAL (MISCELLANEOUS) ×2
SEALANT PROGEL (MISCELLANEOUS) IMPLANT
SEALANT SURG COSEAL 4ML (VASCULAR PRODUCTS) IMPLANT
SEALANT SURG COSEAL 8ML (VASCULAR PRODUCTS) IMPLANT
SET TUBE SMOKE EVAC HIGH FLOW (TUBING) ×4 IMPLANT
SHEARS HARMONIC HDI 20CM (ELECTROSURGICAL) IMPLANT
SHEET MEDIUM DRAPE 40X70 STRL (DRAPES) ×4 IMPLANT
SOL ANTI FOG 6CC (MISCELLANEOUS) IMPLANT
SOLUTION ANTI FOG 6CC (MISCELLANEOUS)
SPECIMEN JAR MEDIUM (MISCELLANEOUS) IMPLANT
SPONGE INTESTINAL PEANUT (DISPOSABLE) IMPLANT
SPONGE TONSIL TAPE 1 RFD (DISPOSABLE) IMPLANT
STAPLE RELOAD 45 2.0 GRAY (STAPLE) ×1
STAPLE RELOAD 45 2.0 GRAY DVNC (STAPLE) ×3 IMPLANT
STAPLER 45 SUREFORM CVD (STAPLE) ×2
STAPLER 45 SUREFORM CVD DVNC (STAPLE) IMPLANT
STAPLER CANNULA SEAL DVNC XI (STAPLE) ×6 IMPLANT
STAPLER CANNULA SEAL XI (STAPLE) ×2
STAPLER RELOAD 2.5X45 WHITE (STAPLE) ×3
STAPLER RELOAD 2.5X45 WHT DVNC (STAPLE) ×9
STAPLER RELOAD 3.5X45 BLU DVNC (STAPLE) ×42
STAPLER RELOAD 3.5X45 BLUE (STAPLE) ×14
STAPLER RELOAD 4.3X45 GREEN (STAPLE) ×3
STAPLER RELOAD 4.3X45 GRN DVNC (STAPLE) ×9
STOPCOCK 4 WAY LG BORE MALE ST (IV SETS) ×4 IMPLANT
SUT PDS AB 3-0 SH 27 (SUTURE) IMPLANT
SUT PROLENE 4 0 RB 1 (SUTURE)
SUT PROLENE 4-0 RB1 .5 CRCL 36 (SUTURE) IMPLANT
SUT SILK  1 MH (SUTURE) ×3
SUT SILK 1 MH (SUTURE) ×6 IMPLANT
SUT SILK 1 TIES 10X30 (SUTURE) ×4 IMPLANT
SUT SILK 2 0 SH (SUTURE) IMPLANT
SUT SILK 2 0SH CR/8 30 (SUTURE) IMPLANT
SUT SILK 3 0 SH 30 (SUTURE) IMPLANT
SUT SILK 3 0SH CR/8 30 (SUTURE) IMPLANT
SUT VIC AB 1 CTX 36 (SUTURE)
SUT VIC AB 1 CTX36XBRD ANBCTR (SUTURE) IMPLANT
SUT VIC AB 2-0 CTX 36 (SUTURE) IMPLANT
SUT VIC AB 3-0 MH 27 (SUTURE) IMPLANT
SUT VIC AB 3-0 X1 27 (SUTURE) ×4 IMPLANT
SUT VICRYL 0 TIES 12 18 (SUTURE) ×4 IMPLANT
SUT VICRYL 0 UR6 27IN ABS (SUTURE) ×8 IMPLANT
SUT VICRYL 2 TP 1 (SUTURE) IMPLANT
SYR 10ML LL (SYRINGE) ×4 IMPLANT
SYR 20ML ECCENTRIC (SYRINGE) ×4 IMPLANT
SYR 20ML LL LF (SYRINGE) ×4 IMPLANT
SYR 30ML LL (SYRINGE) ×4 IMPLANT
SYR 50ML LL SCALE MARK (SYRINGE) ×4 IMPLANT
SYR 5ML LL (SYRINGE) ×4 IMPLANT
SYSTEM SAHARA CHEST DRAIN ATS (WOUND CARE) ×4 IMPLANT
TAPE CLOTH 4X10 WHT NS (GAUZE/BANDAGES/DRESSINGS) ×4 IMPLANT
TAPE CLOTH SURG 4X10 WHT LF (GAUZE/BANDAGES/DRESSINGS) ×1 IMPLANT
TAPE STRIPS DRAPE STRL (GAUZE/BANDAGES/DRESSINGS) ×1 IMPLANT
TIP APPLICATOR SPRAY EXTEND 16 (VASCULAR PRODUCTS) IMPLANT
TOWEL GREEN STERILE (TOWEL DISPOSABLE) ×4 IMPLANT
TOWEL GREEN STERILE FF (TOWEL DISPOSABLE) ×4 IMPLANT
TRAP SPECIMEN MUCOUS 40CC (MISCELLANEOUS) ×4 IMPLANT
TRAY FOLEY MTR SLVR 16FR STAT (SET/KITS/TRAYS/PACK) ×4 IMPLANT
TROCAR BLADELESS 15MM (ENDOMECHANICALS) IMPLANT
TROCAR XCEL 12X100 BLDLESS (ENDOMECHANICALS) ×4 IMPLANT
TUBE CONNECTING 20X1/4 (TUBING) ×8 IMPLANT
TUBING EXTENTION W/L.L. (IV SETS) ×4 IMPLANT
UNDERPAD 30X30 (UNDERPADS AND DIAPERS) ×4 IMPLANT
VALVE BIOPSY  SINGLE USE (MISCELLANEOUS) ×1
VALVE BIOPSY SINGLE USE (MISCELLANEOUS) ×3 IMPLANT
VALVE SUCTION BRONCHIO DISP (MISCELLANEOUS) ×4 IMPLANT
WATER STERILE IRR 1000ML POUR (IV SOLUTION) ×4 IMPLANT

## 2019-07-26 NOTE — Anesthesia Postprocedure Evaluation (Signed)
Anesthesia Post Note  Patient: Larry [name]  Procedure(s) Performed: VIDEO BRONCHOSCOPY WITH ENDOBRONCHIAL NAVIGATION (N/A ) XI ROBOTIC ASSISTED THORASCOPY-RIGHT MIDDLE LOBE WEDGE RESECTION & SUPERIOR SEGMENTECTOMY (Right Chest) Node Dissection (Right ) Intercostal Nerve Block (Right Chest)     Patient location during evaluation: PACU Anesthesia Type: General Level of consciousness: awake Pain management: pain level controlled Vital Signs Assessment: post-procedure vital signs reviewed and stable Respiratory status: spontaneous breathing, nonlabored ventilation, respiratory function stable and patient connected to nasal cannula oxygen Cardiovascular status: blood pressure returned to baseline and stable Postop Assessment: no apparent nausea or vomiting Anesthetic complications: no    Last Vitals:  Vitals:   07/26/19 1734 07/26/19 1914  BP: 127/79   Pulse:    Resp: 19   Temp: 36.7 C   SpO2: 95% 99%    Last Pain:  Vitals:   07/26/19 1734  TempSrc: Oral  PainSc: 5                  Hillis Mcphatter P Keniel Ralston

## 2019-07-26 NOTE — Plan of Care (Signed)

## 2019-07-26 NOTE — Transfer of Care (Signed)
Immediate Anesthesia Transfer of Care Note  Patient: Larry [name]  Procedure(s) Performed: VIDEO BRONCHOSCOPY WITH ENDOBRONCHIAL NAVIGATION (N/A ) XI ROBOTIC ASSISTED THORASCOPY-RIGHT MIDDLE LOBE WEDGE RESECTION & SUPERIOR SEGMENTECTOMY (Right Chest) Node Dissection (Right ) Intercostal Nerve Block (Right Chest)  Patient Location: PACU  Anesthesia Type:General  Level of Consciousness: awake, alert  and oriented  Airway & Oxygen Therapy: Patient connected to face mask oxygen  Post-op Assessment: Report given to RN and Post -op Vital signs reviewed and stable  Post vital signs: Reviewed and stable  Last Vitals:  Vitals Value Taken Time  BP 162/101 07/26/19 1341  Temp    Pulse 97 07/26/19 1345  Resp 9 07/26/19 1345  SpO2 99 % 07/26/19 1345  Vitals shown include unvalidated device data.  Last Pain:  Vitals:   07/26/19 0649  TempSrc:   PainSc: 0-No pain         Complications: respiratory complications. Duoneb given, pt with bilateral expiratory wheezes and stridor. Breath sounds improved, VSS.

## 2019-07-26 NOTE — Op Note (Signed)
Patient: Zeek Rostron  MEDICAL RECORD NO: [id number] ACCOUNT NO.: [id number] DATE OF BIRTH: 1950-07-18 FACILITY: [facility] LOCATION: [facility] PHYSICIAN:Jolanta Cabeza C. Lylie Blacklock, MD  Preoperative diagnosis: Right middle and lower lobe lung nodules  Postoperative diagnosis:  1.  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 Roxan Hockey, MD-primary  Lajuana Matte, MD-assisting  Physician assistant: Ellwood Handler, PA-C  Anesthesia: General  Findings: Nodule in right middle lobe frozen section showed adenocarcinoma.  Superior segment nodule frozen showed squamous cell carcinoma.  Clinical note: Thurl Boen is a 69 year old man with a history of tobacco abuse, COPD, lung cancer, left upper lobectomy, prostate cancer, hyperlipidemia, and angiodysplasia of the cecum.  He had a left upper lobectomy for stage IIb squamous cell carcinoma in 2016.  He has been followed radiographically since then.  In July 2020 he had 2 small nodules in the right lung.  On follow-up CT these nodules had increased in size.  On PET CT there was activity in the superior segmental nodule and only minimal activity in the middle lobe nodule.  We discussed possibility of radiation versus surgical resection.  The indications risks benefits and alternatives were discussed in detail with the patient he understood and accepted the risks and agreed to proceed.  Operative note: Mr. Coia was brought to the preoperative holding area on 07/26/2019.  Anesthesia service placed a central venous catheter and an arterial blood pressure monitoring line.  The surgical site was marked.  He was taken to the  operating room.  He was anesthetized and intubated with a single-lumen endotracheal tube.  A Foley catheter was placed.  Intravenous antibiotics were administered.  Sequential compression devices were placed for DVT prophylaxis.  A bear hugger was placed for active warming.  A timeout was performed.  Flexible fiberoptic bronchoscopy was performed via the endotracheal tube.  The left upper lobe bronchial stump was well-healed.  There were no endobronchial lesions noted.  Navigational planning was done prior to the procedure.  The locatable guide for navigation was placed and registration was performed.  The locatable guide then was advanced to the region of the middle lobe nodule and 1 mL of a 50-50 mixture of methylene blue and ICG was injected.  An attempt then was made to cannulate the tiny branch of the superior segmental bronchus going to that lesion.  That was not possible.  Placing the scope against the distal portion of the bronchus intermedius gave good alignment with the superior segmental nodule in good proximity as well.  1 cc of the ICG/methylene blue solution was injected transbronchially into that site.  He was reintubated with a double-lumen endotracheal tube.  He was placed in a left lateral decubitus position and the right chest was prepped and draped in the usual sterile fashion.  Single lung ventilation of the left lung was initiated and was tolerated throughout the procedure.  A timeout was performed.  A solution containing 20 mL of liposomal bupivacaine, 30 mL of 0.5% bupivacaine, and 50 mL of saline was prepared.  This solution was injected at the sites before making the incisions and also used for the intercostal nerve blocks.  An incision was made in the eighth interspace in the mid axillary line and an 8 mm  port was inserted.  The thoracoscope was advanced into the chest. Carbon dioxide was insufflated into the chest to a pressure of 10 mmHg.  The remaining ports were placed with  thoracoscopic visualization.  12 mm robotic ports were inserted anterior and posterior to the scope port.  An additional 8 mm port was placed posteriorly also in the eighth intercostal space.  A 12 mm assistant's port was placed in the tenth interspace centered between the 2 anterior robotic ports.  The robot was docked.  Arm #3 was attached to the scope and targeting was performed.  The remaining arms were then docked to their respective ports.  Dr. Kipp Brood and Junie Panning Barrett remained at the table.  I went to the console.  There was good isolation of the right lung.  There was no pleural effusion.  Firefly was used to visualize the sites of the ICG injection in the superior segment as well as in the middle lobe.  The inferior ligament was divided.  All lymph nodes that were encountered during the dissection were removed and sent as a separate specimens for permanent pathology.  All appeared grossly benign.  The pleural reflection was divided at the hilum posteriorly.  The superior segmental vein branch was divided with the robotic vascular stapler.  At this point the middle lobe was inspected and the wedge resection of the middle lobe was performed with sequential firings of the robotic stapler using accommodation of blue and green cartridges.  The firefly was used to ensure that the side of the nodule was in the specimen.  The specimen was placed into an endoscopic true bag removed and sent for frozen section, which subsequently returned showing an adenocarcinoma.  The pulmonary artery was visible between the middle and lower lobes the pleura overlying that was opened and a dissection plane was created over the top of the basilar segmental pulmonary artery.  The superior segmental pulmonary artery was identified.  The major fissure between the upper lobe and the superior segment was completed with sequential firings of the robotic stapler.  The superior segmental artery was dissected out by removing  surrounding lymph nodes.  It then was encircled and divided with a vascular stapler.  Additional lymph nodes were removed to get down to the bronchus.  The tiny branch of the superior segmental bronchus that led to the tumor was divided with the stapler.  The segmentectomy then was completed with sequential firings of the robotic stapler again using blue and green cartridges.  The segment was placed into an endoscopic retrieval bag, removed and sent for frozen section, which subsequently returned showing a squamous cell carcinoma.  The lung was retracted inferiorly exposing the paratracheal space.  The paratracheal space was opened and multiple lymph nodes were removed with bipolar cautery.  The chest was copiously irrigated with warm saline.  A test inflation at 30 cm water revealed minimal air leak from the staple lines.  Inspection showed no active bleeding.  All sponges that had been inserted as well as the vessel loop were removed.  The robot was undocked.  The ports were removed.  Intercostal nerve blocks were performed from the third to the tenth interspace.  10 mL of the bupivacaine solution was injected into a subpleural plane from a posterior approach at each level.  A 28 [place] Blake drain was placed through the anterior port incision and secured with a #1 silk suture.  The remaining 4 port incisions were closed with 0 Vicryl fascial sutures and  3-0 Vicryl subcuticular sutures.  Dermabond was applied.  The chest tube was placed to a Pleur-evac.  Patient was placed back in a supine position.  He was extubated in the operating room and taken to the postanesthetic care unit in good condition.  All sponge, needle and instrument counts were correct at the end of the procedure.  Revonda Standard Roxan Hockey, MD [facility] [phone]

## 2019-07-26 NOTE — Interval H&P Note (Signed)
History and Physical Interval Note:  07/26/2019 7:49 AM  Larry [name]  has presented today for surgery, with the diagnosis of RML AND RLL NODULES.  The various methods of treatment have been discussed with the patient and family. After consideration of risks, benefits and other options for treatment, the patient has consented to  Procedure(s): VIDEO BRONCHOSCOPY WITH ENDOBRONCHIAL NAVIGATION (N/A) XI ROBOTIC ASSISTED THORASCOPY-RIGHT MIDDLE LOBE WEDGE RESECTION & SUPERIOR SEGMENTECTOMY (Right) as a surgical intervention.  The patient's history has been reviewed, patient examined, no change in status, stable for surgery.  I have reviewed the patient's chart and labs.  Questions were answered to the patient's satisfaction.     Melrose Nakayama

## 2019-07-26 NOTE — Anesthesia Procedure Notes (Signed)
Procedure Name: Intubation Date/Time: 07/26/2019 8:11 AM Performed by: Griffin Dakin, CRNA Pre-anesthesia Checklist: Patient identified, Emergency Drugs available, Suction available and Patient being monitored Patient Re-evaluated:Patient Re-evaluated prior to induction Oxygen Delivery Method: Circle system utilized Preoxygenation: Pre-oxygenation with 100% oxygen Induction Type: IV induction Ventilation: Mask ventilation without difficulty Laryngoscope Size: Mac and 4 Grade View: Grade I Tube type: Oral Tube size: 8.0 mm Number of attempts: 1 Airway Equipment and Method: Stylet and Oral airway Placement Confirmation: ETT inserted through vocal cords under direct vision,  positive ETCO2 and breath sounds checked- equal and bilateral Secured at: 23 cm Tube secured with: Tape Dental Injury: Teeth and Oropharynx as per pre-operative assessment  Comments: Inserted by Paulina Fusi, srna

## 2019-07-26 NOTE — Anesthesia Procedure Notes (Signed)
Procedure Name: Intubation Date/Time: 07/26/2019 9:10 AM Performed by: Griffin Dakin, CRNA Pre-anesthesia Checklist: Patient identified, Emergency Drugs available, Suction available and Patient being monitored Patient Re-evaluated:Patient Re-evaluated prior to induction Oxygen Delivery Method: Circle system utilized Preoxygenation: Pre-oxygenation with 100% oxygen Induction Type: IV induction Ventilation: Mask ventilation without difficulty Laryngoscope Size: Mac and 4 Grade View: Grade I Tube type: Oral Endobronchial tube: Double lumen EBT and Left and 41 Fr Number of attempts: 1 Airway Equipment and Method: Stylet and Oral airway Placement Confirmation: ETT inserted through vocal cords under direct vision,  positive ETCO2 and breath sounds checked- equal and bilateral Secured at: 31 cm Tube secured with: Tape Dental Injury: Teeth and Oropharynx as per pre-operative assessment  Comments: Inserted by Paulina Fusi, SRNA. Used Vivasight for insertion.

## 2019-07-26 NOTE — Brief Op Note (Signed)
07/26/2019  12:50 PM  PATIENT:  Larry [name]  69 y.o. male  PRE-OPERATIVE DIAGNOSIS:  RML AND RLL NODULES  POST-OPERATIVE DIAGNOSIS:  RML AND RLL NODULES  PROCEDURE:  Procedure(s):  VIDEO BRONCHOSCOPY WITH ENDOBRONCHIAL NAVIGATION (N/A) XI ROBOTIC ASSISTED THORASCOPY-RIGHT MIDDLE LOBE WEDGE RESECTION & SUPERIOR SEGMENTECTOMY (Right) Node Dissection (Right) Intercostal Nerve Block (Right)  SURGEON:  Surgeon(s) and Role:    * Melrose Nakayama, MD - Primary    * Lightfoot, Lucile Crater, MD - Assisting  PHYSICIAN ASSISTANT: Ellwood Handler PA-C  ANESTHESIA:   general  EBL:  100 mL   BLOOD ADMINISTERED:none  DRAINS: 28 Blake Drain   LOCAL MEDICATIONS USED:  BUPIVICAINE   SPECIMEN:  Source of Specimen:  RML Wedge, Superior Segment RLL Lymph Nodes  DISPOSITION OF SPECIMEN:  PATHOLOGY  COUNTS:  YES  TOURNIQUET:  * No tourniquets in log *  DICTATION: .Dragon Dictation  PLAN OF CARE: Admit to inpatient   PATIENT DISPOSITION:  PACU - hemodynamically stable.   Delay start of Pharmacological VTE agent (>24hrs) due to surgical blood loss or risk of bleeding: no

## 2019-07-26 NOTE — Anesthesia Procedure Notes (Signed)
Arterial Line Insertion Start/End2/25/2021 7:00 AM, 07/26/2019 7:00 AM Performed by: CRNA  Patient location: Pre-op. Preanesthetic checklist: patient identified, IV checked, site marked, risks and benefits discussed, surgical consent, monitors and equipment checked, pre-op evaluation, timeout performed and anesthesia consent Lidocaine 1% used for infiltration Left, radial was placed Catheter size: 20 Fr Hand hygiene performed  and maximum sterile barriers used   Attempts: 1 Procedure performed without using ultrasound guided technique. Following insertion, dressing applied. Post procedure assessment: normal and unchanged  Patient tolerated the procedure well with no immediate complications. Additional procedure comments: Inserted by Paulina Fusi, SRNA.

## 2019-07-27 ENCOUNTER — Inpatient Hospital Stay (HOSPITAL_COMMUNITY): Payer: Medicare Other

## 2019-07-27 LAB — CBC
HCT: 27.4 % — ABNORMAL LOW (ref 39.0–52.0)
Hemoglobin: 9.2 g/dL — ABNORMAL LOW (ref 13.0–17.0)
MCH: 25.7 pg — ABNORMAL LOW (ref 26.0–34.0)
MCHC: 33.6 g/dL (ref 30.0–36.0)
MCV: 76.5 fL — ABNORMAL LOW (ref 80.0–100.0)
Platelets: 268 10*3/uL (ref 150–400)
RBC: 3.58 MIL/uL — ABNORMAL LOW (ref 4.22–5.81)
RDW: 16.6 % — ABNORMAL HIGH (ref 11.5–15.5)
WBC: 15.4 10*3/uL — ABNORMAL HIGH (ref 4.0–10.5)
nRBC: 0 % (ref 0.0–0.2)

## 2019-07-27 LAB — BASIC METABOLIC PANEL WITH GFR
Anion gap: 10 (ref 5–15)
BUN: 11 mg/dL (ref 8–23)
CO2: 24 mmol/L (ref 22–32)
Calcium: 8.6 mg/dL — ABNORMAL LOW (ref 8.9–10.3)
Chloride: 103 mmol/L (ref 98–111)
Creatinine, Ser: 0.95 mg/dL (ref 0.61–1.24)
GFR calc Af Amer: >60 mL/min (ref >60)
GFR calc non Af Amer: >60 mL/min (ref >60)
Glucose, Bld: 175 mg/dL — ABNORMAL HIGH (ref 70–99)
Potassium: 3.9 mmol/L (ref 3.5–5.1)
Sodium: 137 mmol/L (ref 135–145)

## 2019-07-27 LAB — BLOOD GAS, ARTERIAL
Acid-Base Excess: 0.3 mmol/L (ref 0.0–2.0)
Bicarbonate: 24.3 mmol/L (ref 20.0–28.0)
Drawn by: 550621
FIO2: 32
O2 Saturation: 96.2 %
Patient temperature: 36.9
pCO2 arterial: 38.3 mmHg (ref 32.0–48.0)
pH, Arterial: 7.418 (ref 7.350–7.450)
pO2, Arterial: 79.1 mmHg — ABNORMAL LOW (ref 83.0–108.0)

## 2019-07-27 LAB — SURGICAL PATHOLOGY

## 2019-07-27 MED ORDER — ENOXAPARIN SODIUM 40 MG/0.4ML ~~LOC~~ SOLN
40.0000 mg | SUBCUTANEOUS | Status: DC
  Administered 2019-07-28 – 2019-08-08 (×12): 40 mg via SUBCUTANEOUS
  Filled 2019-07-27 (×12): qty 0.4

## 2019-07-27 NOTE — Discharge Summary (Addendum)
Physician Discharge Summary  Patient ID: Larry [name] MRN: [id number] DOB/AGE: 1950/11/12 69 y.o.  Admit date: 07/26/2019 Discharge date: 08/08/2019  Admission Diagnoses:Right Upper and Middle lobe lung nodules  Patient Active Problem List   Diagnosis Date Noted  . Mixed hyperlipidemia 04/02/2019  . AAA (abdominal aortic aneurysm) without rupture ([place]) 03/30/2019  . Screening for AAA (abdominal aortic aneurysm) 03/02/2019  . Essential hypertension 03/02/2019  . Coronary artery disease involving native coronary artery of native heart without angina pectoris 02/18/2019  . Aortic atherosclerosis ([place]) 02/18/2019  . Hx of adenomatous colonic polyps 06/07/2018  . 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 Malignant neoplasm of prostate ([place]) 11/21/2014  . S/P lobectomy of lung 10/08/2014  . Hives 08/09/2014  . 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 Lung nodules 03/18/2014  . Hilar adenopathy, left 01/28/2014  . COPD mixed type ([place]) 01/28/2014  . Tobacco abuse 01/28/2014   Discharge Diagnoses:  1. Squamous cell carcinoma right lower lobe (T1N0) 2. Adenocarcinoma right middle lobe (T1N0)  Patient Active Problem List   Diagnosis Date Noted  . S/P Robotic Assisted Right Midde Lobe Wedge Resection, Superior Segmentectomy of Right Lower Lobe, Lymph Node Dissection, Intercostal Nerve Block 07/26/2019  . Mixed hyperlipidemia 04/02/2019  . AAA (abdominal aortic aneurysm) without rupture ([place]) 03/30/2019  . Screening for AAA (abdominal aortic aneurysm) 03/02/2019  . Essential hypertension 03/02/2019  . Coronary artery disease involving native coronary artery of native heart without angina pectoris 02/18/2019  . Aortic atherosclerosis ([place]) 02/18/2019  . Hx of adenomatous colonic polyps 06/07/2018  . 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 Malignant neoplasm of prostate ([place]) 11/21/2014  . S/P lobectomy of lung 10/08/2014  . Hives 08/09/2014  . Stage II squamous  cell carcinoma of left lung ([place]) 05/22/2014  . Lung nodules 03/18/2014  . Hilar adenopathy, left 01/28/2014  . COPD mixed type ([place]) 01/28/2014  . Tobacco abuse 01/28/2014   Discharged Condition: good  History of Present Illness:  Larry [name] is a 69 yo AA male with history of tobacco abuse, COPD, H/O of Lung Cancer for stage IIb squamous cell carcinoma (2016), prostate cancer, angiodysplasia of the cecum, and hyperlipidemia.  His lung cancer was treated with neoadjuvant therapy followed by lobectomy in 2016.  He has been routinely followed since that time.  He was noted to have 2 new lung nodules on the right side in July of 2020.  A repeat CT scan showed a slight increase in size of one of the nodules.  PET CT was obtained and showed the right lower lobe superior segment nodule to have an SUV of 2.2 and the middle lobe cavitary nodule had an SUV of 1.2.  There was no evidence of hypermetabolic adenopathy.  He was evaluated by Dr. Roxan Hockey in January of this year.  Surgical intervention was discussed, however the patient was experiencing a lot of respiratory issues at the time.  Due to this he was referred to Dr. Lamonte Sakai.  He adjusted the patient's inhalers.  He was again evaluated by Dr. Roxan Hockey at which time he continued to be smoking, but had decreased to a few cigarettes per day.  He noticed his breathing was much improved with his new inhaler regimen.  It was again recommended the patient undergo surgical resection of his lung nodules.  The risks and benefits of the procedure were explained to the patient and she was agreeable to proceed.  Hospital Course:   Mr. [name] presented to [facility] on 07[facility]26/2019.  He was taken to the operating room and underwent Electromagnetic navigational bronchoscopy, Xi robotic assisted right thoracoscopy, Right middle lobe wedge resection, Right lower lobe superior segmentectomy, Lymph node dissection, and Intercostal nerve blocks levels 3 through 10.  He  tolerated the procedure without difficulty, was extubated and taken to the PACU in stable condition.  The patients chest tube was left to water seal post operatively.  This remained free from air leak.  His post operative CXR showed no evidence of pneumothorax.  His chest tube was removed on 07/27/2019.  Follow up CXR showed trace apical pneumothorax.  Unfortunately the patient developed increase work of breathing and wheezing, repeat CXR showed an increase in previously tiny apical pneumothorax.  He required repeat placemen of chest tube.  He tolerated the procedure and his symptoms improved.  Follow up CXR showed resolution of pneumothorax.  He had a small air leak with stable appearance of his CXR.  He was again placed to water seal on 07/30/2019.  Follow up CXR showed stable appearance of CXR.  His air leak persisted and he his chest tube was placed to a Mini Express.  Follow up CXR remained stable.  The patient was uncomfortable going home with a chest tube.  His chest tube was clamped on 03/08.  Follow up CXR showed an increase in right pneumothorax. As a result, chest tube was placed back to 10 cm suction .CXR  The am of 03/09 showed near resolution  of right pneumothorax ( pneumothorax about 5%). Dr. Roxan Hockey had a discussion with patient and patient now agreeable to go home with a mini express so chest tube was placed to a mini express on 03/09. Follow up chest x ray showed trace right apical pneumothorax. The patient has underlying COPD.  He had some mild atelectasis/stridor both of which improved.  He was treated with home inhaler regimen, IS, and nebulizers as needed.  He was ambulating with assistance of a walker.  PT consult was obtained and recommended home health PT, which has been arranged.  He is tolerating a diet.  His pain is well controlled.  He is medically stable for discharge home today.    Significant Diagnostic Studies  Pathology:   SURGICAL PATHOLOGY  CASE: [id number]  PATIENT:  Larry [name]  Surgical Pathology Report   Clinical History: RML and RLL nodules (cm)   FINAL MICROSCOPIC DIAGNOSIS:   A. LUNG, RIGHT MIDDLE LOBE, WEDGE RESECTION:  - Moderately differentiated lung adenocarcinoma, 1.6 cm, papillary  predominant  - Visceral pleural surface is not involved  - Resection margins negative for carcinoma  - No evidence of lymphovascular invasion  - See oncology table   B. LUNG, RIGHT LOWER LOBE SUPERIOR SEGMENT, LOBECTOMY:  - Invasive moderately differentiated squamous cell carcinoma, 0.9 cm  - Resection margins are negative for carcinoma  - Negative for lymphovascular invasion  - Lung parenchyma with features of smoking induced lung injury  - See oncology table   C. LYMPH NODE, LEVEL 9R, EXCISION:  - Lymph node, negative for carcinoma (0/1)   D. LYMPH NODE, LEVEL 8, EXCISION:  - Lymph node, negative for carcinoma (0/1)   E. LYMPH NODE, LEVEL 7, EXCISION:  - Lymph node, negative for carcinoma (0/1)   F. LYMPH NODE, LEVEL 7 #2, EXCISION:  - Lymph node, negative for carcinoma (0/1)   G. LYMPH NODE, LEVEL 7 #3, EXCISION:  - Lymph node, negative for carcinoma (0/1)   H. LYMPH NODE, LEVEL 12R, EXCISION:  -  Lymph node, negative for carcinoma (0/1)   I. LYMPH NODE, LEVEL 12R #2, EXCISION:  - Lymph node, negative for carcinoma (0/1)   J. LYMPH NODE, LEVEL 13R, EXCISION:  - Benign lymphoid and fibroconnective tissue, negative for carcinoma   K. LYMPH NODE, LEVEL 11R, EXCISION:  - Benign fibroconnective tissue, negative for carcinoma   L. LYMPH NODE, LEVEL 13R #2, EXCISION:  - Lymph node, negative for carcinoma (0/1)   M. LYMPH NODE, LEVEL 12R #3 , EXCISION:  - Lymph node, negative for carcinoma (0/1)   N. LYMPH NODE, LEVEL 13R #3, EXCISION:  - Lymph node, negative for carcinoma (0/1)   O. LYMPH NODE, LEVEL 4R, EXCISION:  - Lymph node, negative for carcinoma (0/1)   P. LYMPH NODE, LEVEL 4R #2, EXCISION:  - Lymph node, negative for  carcinoma (0/1)   Q. LYMPH NODE, LEVEL 10R, EXCISION:  - Lymph node, negative for carcinoma (0/1)   R. LYMPH NODE, LEVEL 10R #2, EXCISION:  - Lymph node, negative for carcinoma (0/1)   Treatments: surgery:   Postoperative diagnosis:  1.  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  Squamous cell carcinoma right lower lobe- Clinical stage T1, N0  Procedure: Electromagnetic navigational bronchoscopy Xi robotic assisted right thoracoscopy Right middle lobe wedge resection Right lower lobe superior segmentectomy Lymph node dissection Intercostal nerve blocks levels 3 through 10  Discharge Exam: Blood pressure 120/75, pulse 86, temperature 98.1 F (36.7 C), temperature source Oral, resp. rate 17, height 6' (1.829 m), weight 94.9 kg, SpO2 92 %.  General appearance: alert, cooperative and no distress Heart: regular rate and rhythm Lungs: clear to auscultation bilaterally Abdomen: soft, non-tender; bowel sounds normal; no masses,  no organomegaly Extremities: extremities normal, atraumatic, no cyanosis or edema Wound: clean and dry    Discharge Medications:   Allergies as of 08/08/2019   No Known Allergies     Medication List    TAKE these medications   acetaminophen 325 MG tablet Commonly known as: Tylenol Take 2 tablets (650 mg total) by mouth every 4 (four) hours as needed for mild pain.   albuterol 108 (90 Base) MCG/ACT inhaler Commonly known as: VENTOLIN HFA Inhale 1-2 puffs into the lungs every 6 (six) hours as needed for wheezing or shortness of breath.   amLODipine 10 MG tablet Commonly known as: NORVASC TAKE 1 TABLET BY MOUTH EVERY DAY   aspirin 81 MG chewable tablet Chew 81 mg by mouth daily.   buPROPion 100 MG 12 hr tablet Commonly known as: WELLBUTRIN SR TAKE 1 TABLET BY MOUTH TWICE A DAY What changed: when to take this   metoprolol succinate 25 MG 24 hr tablet Commonly known as: TOPROL-XL Take 1 tablet (25 mg total) by mouth  daily.   rosuvastatin 40 MG tablet Commonly known as: Crestor Take 1 tablet (40 mg total) by mouth daily.   Stiolto Respimat 2.5-2.5 MCG/ACT Aers Generic drug: Tiotropium Bromide-Olodaterol Inhale 2 puffs into the lungs daily.   Symbicort 160-4.5 MCG/ACT inhaler Generic drug: budesonide-formoterol INHALE 2 PUFFS INTO THE LUNGS 2 TIMES DAILY   traMADol 50 MG tablet Commonly known as: ULTRAM Take 1 tablet (50 mg total) by mouth every 6 (six) hours as needed (mild pain).            Durable Medical Equipment  (From admission, onward)         Start     Ordered   08/02/19 0724  For home use only DME 4 wheeled rolling walker  with seat  Once    Question:  Patient needs a walker to treat with the following condition  Answer:  Physical deconditioning   08/02/19 0725   08/01/19 1312  For home use only DME 4 wheeled rolling walker with seat  Once    Question:  Patient needs a walker to treat with the following condition  Answer:  Weakness   08/01/19 1312   08/01/19 0736  For home use only DME oxygen  Once    Question Answer Comment  Length of Need 6 Months   Mode or (Route) Nasal cannula   Liters per Minute 2   Frequency Continuous (stationary and portable oxygen unit needed)   Oxygen conserving device No   Oxygen delivery system Gas      08/01/19 0736         Follow-up Information    Melrose Nakayama, MD Follow up on 08/14/2019.   Specialty: Cardiothoracic Surgery Why: Appointment is at 9:45, please get CXR at 9:00 at [facility] located on first floor of our office building Contact information: [place] [address] [place] [address] Follow up.   Specialty: Home Health Services Why: HHPT, [place] information: [place] [address] [phone]        [facility] Follow up.   Why: home oxygen, rollator Contact information: [place]  [address] [phone]           Signed: Ellwood Handler, PA-C  08/08/2019, 7:22 AM

## 2019-07-27 NOTE — Plan of Care (Signed)

## 2019-07-27 NOTE — Progress Notes (Addendum)
[address]       [place] [address]             [phone]      1 Day Post-Op Procedure(s) (LRB): VIDEO BRONCHOSCOPY WITH ENDOBRONCHIAL NAVIGATION (N/A) XI ROBOTIC ASSISTED THORASCOPY-RIGHT MIDDLE LOBE WEDGE RESECTION & SUPERIOR SEGMENTECTOMY (Right) Node Dissection (Right) Intercostal Nerve Block (Right)   Subjective:  Up in bed getting a breathing treatment.  Doing pretty well, states not having much pain.  Had some mild nausea, but was able to eat some breakfast.  Objective: Vital signs in last 24 hours: Temp:  [96.8 F (36 C)-98.5 F (36.9 C)] 98.4 F (36.9 C) (02/26 0308) Pulse Rate:  [96-110] 103 (02/26 0400) Cardiac Rhythm: Sinus tachycardia (02/26 0700) Resp:  [9-40] 17 (02/26 0400) BP: (127-163)/(78-104) 136/78 (02/26 0308) SpO2:  [95 %-100 %] 97 % (02/26 0400) Arterial Line BP: (171-200)/(79-86) 196/79 (02/25 1351)  Intake/Output from previous day: 02/25 0701 - 02/26 0700 In: 3170 [P.O.:720; I.V.:2100; IV Piggyback:350] Out: 0254 [Urine:1520; Blood:100; Chest Tube:255]  General appearance: alert, cooperative and no distress Heart: regular rate and rhythm Lungs: clear to auscultation bilaterally Abdomen: soft, non-tender; bowel sounds normal; no masses,  no organomegaly Extremities: extremities normal, atraumatic, no cyanosis or edema Wound: clean and dry  Lab Results: Recent Labs    07/24/19 0851 07/26/19 1039 07/26/19 1251 07/27/19 0507  WBC 7.6  --   --  15.4*  HGB 10.4*   < > 9.9* 9.2*  HCT 31.6*   < > 29.0* 27.4*  PLT 274  --   --  268   < > = values in this interval not displayed.   BMET:  Recent Labs    07/24/19 0851 07/26/19 1039 07/26/19 1251 07/27/19 0507  NA 137   < > 139 137  K 3.8   < > 5.0 3.9  CL 106  --   --  103  CO2 20*  --   --  24  GLUCOSE 134*  --   --  175*  BUN 9  --   --  11  CREATININE 0.76  --   --  0.95  CALCIUM 9.1  --   --  8.6*   < > = values in this interval not displayed.      PT/INR:  Recent Labs    07/24/19 0851  LABPROT 12.8  INR 1.0   ABG    Component Value Date/Time   PHART 7.418 07/27/2019 0502   HCO3 24.3 07/27/2019 0502   TCO2 30 07/26/2019 1251   ACIDBASEDEF 3.0 (H) 07/26/2019 1201   O2SAT 96.2 07/27/2019 0502   CBG (last 3)  No results for input(s): GLUCAP in the last 72 hours.  Assessment/Plan: S/P Procedure(s) (LRB): VIDEO BRONCHOSCOPY WITH ENDOBRONCHIAL NAVIGATION (N/A) XI ROBOTIC ASSISTED THORASCOPY-RIGHT MIDDLE LOBE WEDGE RESECTION & SUPERIOR SEGMENTECTOMY (Right) Node Dissection (Right) Intercostal Nerve Block (Right)  1. CV- NSR, H/O HTN- on home Norvasc 2. Pulm- no acute issues, CT w/o air leak, 300 ml of fluid since surgery, CXR w/o pneumothorax,  possibly d/c chest tube today 3. Renal- creatinine, lytes okay 4. Lovenox for DVT prophylaxis 5. IV fluids to KVO 6. Dispo- patient stable, possibly d/c chest tube today, defer to Dr. Roxan Hockey, continue current care   LOS: 1 day    Ellwood Handler, PA-C  07/27/2019 Patient seen and examined, agree with above No air leak and minimal drainage- dc chest tube [employer] He still has a little stridor  but improved from yesterday CXR still shows some middle lobe atelectasis but also improved from yesterday  Remo Lipps C. Roxan Hockey, MD [facility] [phone]

## 2019-07-27 NOTE — Discharge Instructions (Signed)
Discharge Instructions:  1. You may shower, please wash incisions daily with soap and water and keep dry.  If you wish to cover wounds with dressing you may do so but please keep clean and change daily.  No tub baths or swimming until incisions have completely healed.  If your incisions become red or develop any drainage please call our office at [phone]  2. No Driving until cleared by Dr. Leonarda Salon office and you are no longer using narcotic pain medications  3. Fever of 101.5 for at least 24 hours with no source, please contact our office at [phone]  4. Activity- up as tolerated, please walk at least 3 times per day.  Avoid strenuous activity  5. If any questions or concerns arise, please do not hesitate to contact our office at [phone]

## 2019-07-28 ENCOUNTER — Inpatient Hospital Stay (HOSPITAL_COMMUNITY): Payer: Medicare Other

## 2019-07-28 LAB — CBC
HCT: 26.6 % — ABNORMAL LOW (ref 39.0–52.0)
Hemoglobin: 8.9 g/dL — ABNORMAL LOW (ref 13.0–17.0)
MCH: 25.5 pg — ABNORMAL LOW (ref 26.0–34.0)
MCHC: 33.5 g/dL (ref 30.0–36.0)
MCV: 76.2 fL — ABNORMAL LOW (ref 80.0–100.0)
Platelets: 240 10*3/uL (ref 150–400)
RBC: 3.49 MIL/uL — ABNORMAL LOW (ref 4.22–5.81)
RDW: 16.6 % — ABNORMAL HIGH (ref 11.5–15.5)
WBC: 15.8 10*3/uL — ABNORMAL HIGH (ref 4.0–10.5)
nRBC: 0 % (ref 0.0–0.2)

## 2019-07-28 LAB — COMPREHENSIVE METABOLIC PANEL
ALT: 23 U/L (ref 0–44)
AST: 36 U/L (ref 15–41)
Albumin: 3.2 g/dL — ABNORMAL LOW (ref 3.5–5.0)
Alkaline Phosphatase: 56 U/L (ref 38–126)
Anion gap: 10 (ref 5–15)
BUN: 10 mg/dL (ref 8–23)
CO2: 26 mmol/L (ref 22–32)
Calcium: 8.6 mg/dL — ABNORMAL LOW (ref 8.9–10.3)
Chloride: 100 mmol/L (ref 98–111)
Creatinine, Ser: 0.85 mg/dL (ref 0.61–1.24)
GFR calc Af Amer: >60 mL/min (ref >60)
GFR calc non Af Amer: >60 mL/min (ref >60)
Glucose, Bld: 158 mg/dL — ABNORMAL HIGH (ref 70–99)
Potassium: 3.8 mmol/L (ref 3.5–5.1)
Sodium: 136 mmol/L (ref 135–145)
Total Bilirubin: 0.5 mg/dL (ref 0.3–1.2)
Total Protein: 6.5 g/dL (ref 6.5–8.1)

## 2019-07-28 NOTE — Progress Notes (Signed)
CRITICAL VALUE ALERT  Critical Value:  Radiology - CXR  Date & Time Notied:  07/28/2019 at 0730  Provider Notified: Jadene Pierini PA    Orders Received/Actions taken: No new orders. Continue to monitor the Pt.

## 2019-07-28 NOTE — Plan of Care (Signed)

## 2019-07-28 NOTE — Progress Notes (Signed)
Patient was admitted with 2/25 for a Robotic VAT's. His chest tube was pulled on 2/26 and he has developed a small Right lateral ptx s/p the chest tube removal. MD's are aware of this patients Rhonchi breath sounds, tachypnea with activity and last chest xray report. See MD's notes. Patient is wearing O2 at 1L/[place] and he is tolerating small amounts of activity. Respiratory therapy giving breathing treatments as ordered. Chest xray is ordered again in AM and results may indicate a new chest tube placement. The red MEWS is not a new indication of trouble for this patient and we will continue to monitor patient closely.

## 2019-07-28 NOTE — Progress Notes (Addendum)
[address]       [place] [address]             [phone]      2 Days Post-Op Procedure(s) (LRB): VIDEO BRONCHOSCOPY WITH ENDOBRONCHIAL NAVIGATION (N/A) XI ROBOTIC ASSISTED THORASCOPY-RIGHT MIDDLE LOBE WEDGE RESECTION & SUPERIOR SEGMENTECTOMY (Right) Node Dissection (Right) Intercostal Nerve Block (Right) Subjective: Somewhat dyspneic  Objective: Vital signs in last 24 hours: Temp:  [98.1 F (36.7 C)-98.6 F (37 C)] 98.1 F (36.7 C) (02/27 0759) Pulse Rate:  [95-121] 112 (02/27 0759) Cardiac Rhythm: Sinus tachycardia (02/27 0700) Resp:  [16-23] 23 (02/27 0759) BP: (132-171)/(75-96) 137/75 (02/27 0759) SpO2:  [93 %-100 %] 99 % (02/27 0759) FiO2 (%):  [28 %] 28 % (02/27 0759)  Hemodynamic parameters for last 24 hours:    Intake/Output from previous day: 02/26 0701 - 02/27 0700 In: 1756.8 [P.O.:440; I.V.:1316.8] Out: 1525 [Urine:1525] Intake/Output this shift: No intake/output data recorded.  General appearance: alert, cooperative and no distress Heart: regular rate and rhythm and tachy Lungs: coarse ronchi Abdomen: soft, non-tender Extremities: no edema or calf tenderness Wound: incis healing well  Lab Results: Recent Labs    07/27/19 0507 07/28/19 0323  WBC 15.4* 15.8*  HGB 9.2* 8.9*  HCT 27.4* 26.6*  PLT 268 240   BMET:  Recent Labs    07/27/19 0507 07/28/19 0323  NA 137 136  K 3.9 3.8  CL 103 100  CO2 24 26  GLUCOSE 175* 158*  BUN 11 10  CREATININE 0.95 0.85  CALCIUM 8.6* 8.6*    PT/INR: No results for input(s): LABPROT, INR in the last 72 hours. ABG    Component Value Date/Time   PHART 7.418 07/27/2019 0502   HCO3 24.3 07/27/2019 0502   TCO2 30 07/26/2019 1251   ACIDBASEDEF 3.0 (H) 07/26/2019 1201   O2SAT 96.2 07/27/2019 0502   CBG (last 3)  No results for input(s): GLUCAP in the last 72 hours.  Meds Scheduled Meds: . acetaminophen  1,000 mg Oral Q6H   Or  . acetaminophen (TYLENOL) oral liquid 160 mg/5  mL  1,000 mg Oral Q6H  . amLODipine  10 mg Oral Daily  . aspirin  81 mg Oral Daily  . bisacodyl  10 mg Oral Daily  . buPROPion  100 mg Oral BID  . Chlorhexidine Gluconate Cloth  6 each Topical Daily  . enoxaparin (LOVENOX) injection  40 mg Subcutaneous Q24H  . ipratropium-albuterol  3 mL Nebulization Q6H  . mometasone-formoterol  2 puff Inhalation BID  . rosuvastatin  40 mg Oral Daily  . senna-docusate  1 tablet Oral QHS   Continuous Infusions: . sodium chloride Stopped (07/27/19 1500)  . sodium chloride 10 mL/hr at 07/27/19 1500   PRN Meds:.Place/Maintain arterial line **AND** sodium chloride, albuterol, fentaNYL (SUBLIMAZE) injection, ondansetron (ZOFRAN) IV, oxyCODONE, traMADol  Xrays DG Chest 2 View  Addendum Date: 07/28/2019   ADDENDUM REPORT: 07/28/2019 07:30 ADDENDUM: I discussed this film with the patient's nurse, Vinnie Level, at 0730 hours and she was already aware of the small right pneumothorax. Electronically Signed   By: Misty Stanley M.D.   On: 07/28/2019 07:30   Result Date: 07/28/2019 CLINICAL DATA:  History of left upper lobectomy. Recent right middle lobe wedge resection and right lower lobe superior segmentectomy on 07/26/2019. EXAM: CHEST - 2 VIEW COMPARISON:  07/27/2019 FINDINGS: Asymmetric elevation right hemidiaphragm. Right parahilar opacity is similar. Interval development of a small right pneumothorax with some associated pleural fluid evident.  A series 7 alarm for 15 minutes. Left lung clear. The visualized bony structures of the thorax are intact. Telemetry leads overlie the chest. IMPRESSION: 1. Interval development of small right pneumothorax with trace pleural fluid associated. 2. Similar right parahilar ill-defined opacity. Electronically Signed: By: Misty Stanley M.D. On: 07/28/2019 07:12   DG Chest 1V REPEAT Same Day  Result Date: 07/27/2019 CLINICAL DATA:  Chest tube removal, denies shortness of breath or cough. EXAM: CHEST - 1 VIEW SAME DAY COMPARISON:   07/27/2019 at 4:23 a.m. FINDINGS: Right IJ catheter remains in place terminating at the caval to atrial junction. Post right-sided chest tube removal with persistent dense opacity at the right hilum tracking into right middle lobe. Chain sutures from middle and lower lobe wedge resection are noted along the margins. Appearance is similar to recent comparison studies. No signs of apical pneumothorax. Some mild distortion of the minor fissure with perhaps trace amount of pleural air in this location along the lateral margin of the minor fissure versus distortion of minor fissure from recent surgery, no deep sulcus sign. The above finding along the fissure is unchanged based on comparison with the prior imaging evaluation. Cardiomediastinal contours are stable. No acute bone process. IMPRESSION: 1. Post right-sided chest tube removal with persistent dense opacity at the right hilum tracking into the right mid chest associated with middle lobe and lower lobe wedge resection, opacity unchanged from prior exam. 2. No definite pneumothorax. Possible tiny amount of pleural air along the minor fissure in the right chest versus fissural distortion, attention on follow-up. Electronically Signed   By: Zetta Bills M.D.   On: 07/27/2019 09:35   DG CHEST PORT 1 VIEW  Result Date: 07/27/2019 CLINICAL DATA:  Post partial lobectomy EXAM: PORTABLE CHEST 1 VIEW COMPARISON:  CT 07/26/2019, radiograph 07/26/2019 FINDINGS: Redemonstrated opacity in the right mid lung partially silhouetting portion of the right heart border with additional thickening along the right minor fissure. Overall appearance is unchanged from prior. Stable appearance of a right chest tube. Right IJ catheter tip terminates in the right atrium. Cardiomediastinal contours are unchanged from prior multiple surgical clips projecting over the mediastinum. The aorta is calcified. The remaining cardiomediastinal contours are unremarkable. No acute osseous or soft  tissue abnormality. IMPRESSION: Stable opacities in the right mid to lower lung likely reflecting a combination of atelectasis and postsurgical change. Small volume pleural effusion is not fully excluded. Stable appearance of the lines and tubes as above. Electronically Signed   By: Lovena Le M.D.   On: 07/27/2019 05:08   DG Chest Port 1 View  Result Date: 07/26/2019 CLINICAL DATA:  Status post partial lobectomy. EXAM: PORTABLE CHEST 1 VIEW COMPARISON:  Chest x-ray 07/24/2019. FINDINGS: Right IJ line noted with tip over superior vena cava. Right chest tube noted with tip over right upper chest. No pneumothorax. Atelectatic and or postsurgical changes noted over the right mid and lower lung. Small right pleural effusion cannot be excluded. Heart size stable. No acute bony abnormality. IMPRESSION: Right IJ line right chest tube in good anatomic position. No pneumothorax. 2. Atelectatic and or postsurgical changes noted over the right mid and lower lung. Small right pleural effusion cannot be excluded. Electronically Signed   By: Marcello Moores  Register   On: 07/26/2019 14:03   DG C-ARM BRONCHOSCOPY  Result Date: 07/26/2019 C-ARM BRONCHOSCOPY: Fluoroscopy was utilized by the requesting physician.  No radiographic interpretation.    Assessment/Plan: S/P Procedure(s) (LRB): VIDEO BRONCHOSCOPY WITH ENDOBRONCHIAL NAVIGATION (N/A) XI ROBOTIC  ASSISTED THORASCOPY-RIGHT MIDDLE LOBE WEDGE RESECTION & SUPERIOR SEGMENTECTOMY (Right) Node Dissection (Right) Intercostal Nerve Block (Right)  1 developed small pntx after Chest tube pulled, cont aggressive pulm toilet/current nebs. Sats ok on 2 liters 2 stable hemodynamics but is a bit tachy at times, esp with activity 3 H.H is pretty stable 4 leukocytosis is stable, no fevers- cont monitor clinically 5 normal renal fxn 6 BS elevated some- no DM hx/meds  LOS: 2 days    Larry [name] [facility] 07/28/2019 Pager [phone]   Chart reviewed, patient examined, agree  with above. He says he feels short of breath with ambulation.  He is wheezing but nurse says that is unchanged. CXR shows small right lateral ptx which looks about the same as post pull CXR yesterday. Will observe and repeat in am.

## 2019-07-29 ENCOUNTER — Inpatient Hospital Stay (HOSPITAL_COMMUNITY): Payer: Medicare Other

## 2019-07-29 DIAGNOSIS — J9311 Primary spontaneous pneumothorax: Secondary | ICD-10-CM

## 2019-07-29 MED ORDER — SODIUM CHLORIDE 0.9% FLUSH
10.0000 mL | Freq: Two times a day (BID) | INTRAVENOUS | Status: DC
  Administered 2019-07-29 – 2019-08-05 (×10): 10 mL via INTRAVENOUS

## 2019-07-29 MED ORDER — LIDOCAINE HCL 1 % IJ SOLN
10.0000 mL | Freq: Once | INTRAMUSCULAR | Status: AC
  Administered 2019-07-29: 10 mL
  Filled 2019-07-29: qty 10

## 2019-07-29 MED ORDER — SODIUM CHLORIDE 0.9% FLUSH: 3.0000 mL | INTRAVENOUS | Status: DC | PRN

## 2019-07-29 MED ORDER — SODIUM CHLORIDE 0.9% FLUSH: 10.0000 mL | INTRAVENOUS | Status: DC | PRN

## 2019-07-29 MED ORDER — SODIUM CHLORIDE 0.9% FLUSH
3.0000 mL | Freq: Two times a day (BID) | INTRAVENOUS | Status: DC
  Administered 2019-07-29 – 2019-08-08 (×15): 3 mL via INTRAVENOUS

## 2019-07-29 NOTE — Progress Notes (Addendum)
[address]       [name] [address]             [phone]      3 Days Post-Op Procedure(s) (LRB): VIDEO BRONCHOSCOPY WITH ENDOBRONCHIAL NAVIGATION (N/A) XI ROBOTIC ASSISTED THORASCOPY-RIGHT MIDDLE LOBE WEDGE RESECTION & SUPERIOR SEGMENTECTOMY (Right) Node Dissection (Right) Intercostal Nerve Block (Right) Subjective: Appears more dyspneic, had a "rough night"  Objective: Vital signs in last 24 hours: Temp:  [97.9 F (36.6 C)-98.6 F (37 C)] 98.5 F (36.9 C) (02/28 0723) Pulse Rate:  [110-128] 114 (02/28 0723) Cardiac Rhythm: Sinus tachycardia (02/28 0700) Resp:  [18-27] 25 (02/28 0723) BP: (122-167)/(65-91) 167/82 (02/28 0723) SpO2:  [96 %-100 %] 100 % (02/28 0743) FiO2 (%):  [24 %-28 %] 28 % (02/28 0743)  Hemodynamic parameters for last 24 hours:    Intake/Output from previous day: 02/27 0701 - 02/28 0700 In: 600 [P.O.:600] Out: 750 [Urine:750] Intake/Output this shift: No intake/output data recorded.  General appearance: alert, cooperative, fatigued and mild distress Heart: regular rate and rhythm and tachy Lungs: scattered ronchi with some wheezing Abdomen: benign Extremities: no edema or calf tenderness Wound: incis healinh ok  Lab Results: Recent Labs    07/27/19 0507 07/28/19 0323  WBC 15.4* 15.8*  HGB 9.2* 8.9*  HCT 27.4* 26.6*  PLT 268 240   BMET:  Recent Labs    07/27/19 0507 07/28/19 0323  NA 137 136  K 3.9 3.8  CL 103 100  CO2 24 26  GLUCOSE 175* 158*  BUN 11 10  CREATININE 0.95 0.85  CALCIUM 8.6* 8.6*    PT/INR: No results for input(s): LABPROT, INR in the last 72 hours. ABG    Component Value Date/Time   PHART 7.418 07/27/2019 0502   HCO3 24.3 07/27/2019 0502   TCO2 30 07/26/2019 1251   ACIDBASEDEF 3.0 (H) 07/26/2019 1201   O2SAT 96.2 07/27/2019 0502   CBG (last 3)  No results for input(s): GLUCAP in the last 72 hours.  Meds Scheduled Meds: . acetaminophen  1,000 mg Oral Q6H   Or  .  acetaminophen (TYLENOL) oral liquid 160 mg/5 mL  1,000 mg Oral Q6H  . amLODipine  10 mg Oral Daily  . aspirin  81 mg Oral Daily  . bisacodyl  10 mg Oral Daily  . buPROPion  100 mg Oral BID  . Chlorhexidine Gluconate Cloth  6 each Topical Daily  . enoxaparin (LOVENOX) injection  40 mg Subcutaneous Q24H  . ipratropium-albuterol  3 mL Nebulization Q6H  . mometasone-formoterol  2 puff Inhalation BID  . rosuvastatin  40 mg Oral Daily  . senna-docusate  1 tablet Oral QHS   Continuous Infusions: . sodium chloride Stopped (07/27/19 1500)  . sodium chloride 10 mL/hr at 07/27/19 1500   PRN Meds:.Place/Maintain arterial line **AND** sodium chloride, albuterol, fentaNYL (SUBLIMAZE) injection, ondansetron (ZOFRAN) IV, oxyCODONE, traMADol  Xrays DG Chest 2 View  Result Date: 07/29/2019 CLINICAL DATA:  Status post AVR. Shortness of breath. EXAM: CHEST - 2 VIEW COMPARISON:  07/28/2019 FINDINGS: Interval increase in the right-sided pneumothorax. Right parahilar opacity seen previously is less evident today and may be collapsed down into the infrahilar right lung base. Left lung remains clear. A right IJ central line again noted. The cardiopericardial silhouette is within normal limits for size. The visualized bony structures of the thorax are intact. Telemetry leads overlie the chest. IMPRESSION: Interval increase in size of right-sided pneumothorax. Right parahilar opacity seen previously may now  be collapsed down into the right infrahilar medial hemithorax. Electronically Signed   By: Misty Stanley M.D.   On: 07/29/2019 07:42   DG Chest 2 View  Addendum Date: 07/28/2019   ADDENDUM REPORT: 07/28/2019 07:30 ADDENDUM: I discussed this film with the patient's nurse, Vinnie Level, at 0730 hours and she was already aware of the small right pneumothorax. Electronically Signed   By: Misty Stanley M.D.   On: 07/28/2019 07:30   Result Date: 07/28/2019 CLINICAL DATA:  History of left upper lobectomy. Recent right middle  lobe wedge resection and right lower lobe superior segmentectomy on 07/26/2019. EXAM: CHEST - 2 VIEW COMPARISON:  07/27/2019 FINDINGS: Asymmetric elevation right hemidiaphragm. Right parahilar opacity is similar. Interval development of a small right pneumothorax with some associated pleural fluid evident. A series 7 alarm for 15 minutes. Left lung clear. The visualized bony structures of the thorax are intact. Telemetry leads overlie the chest. IMPRESSION: 1. Interval development of small right pneumothorax with trace pleural fluid associated. 2. Similar right parahilar ill-defined opacity. Electronically Signed: By: Misty Stanley M.D. On: 07/28/2019 07:12   DG Chest 1V REPEAT Same Day  Result Date: 07/27/2019 CLINICAL DATA:  Chest tube removal, denies shortness of breath or cough. EXAM: CHEST - 1 VIEW SAME DAY COMPARISON:  07/27/2019 at 4:23 a.m. FINDINGS: Right IJ catheter remains in place terminating at the caval to atrial junction. Post right-sided chest tube removal with persistent dense opacity at the right hilum tracking into right middle lobe. Chain sutures from middle and lower lobe wedge resection are noted along the margins. Appearance is similar to recent comparison studies. No signs of apical pneumothorax. Some mild distortion of the minor fissure with perhaps trace amount of pleural air in this location along the lateral margin of the minor fissure versus distortion of minor fissure from recent surgery, no deep sulcus sign. The above finding along the fissure is unchanged based on comparison with the prior imaging evaluation. Cardiomediastinal contours are stable. No acute bone process. IMPRESSION: 1. Post right-sided chest tube removal with persistent dense opacity at the right hilum tracking into the right mid chest associated with middle lobe and lower lobe wedge resection, opacity unchanged from prior exam. 2. No definite pneumothorax. Possible tiny amount of pleural air along the minor  fissure in the right chest versus fissural distortion, attention on follow-up. Electronically Signed   By: Zetta Bills M.D.   On: 07/27/2019 09:35    Assessment/Plan: S/P Procedure(s) (LRB): VIDEO BRONCHOSCOPY WITH ENDOBRONCHIAL NAVIGATION (N/A) XI ROBOTIC ASSISTED THORASCOPY-RIGHT MIDDLE LOBE WEDGE RESECTION & SUPERIOR SEGMENTECTOMY (Right) Node Dissection (Right) Intercostal Nerve Block (Right)  1 some interval increase in right pntx, will discuss with MD, may need tube placement 2 sats are ok on 2 liters [place], conts dulera and duonebs 3 some HTN at times, multifactorial, cont to observe on norvasc, may need additional agent 4 no other new labs 5 cont pulm toilet and mobilization    LOS: 3 days    Larry Giovanni PA-C Pager [phone] 07/29/2019    Chart reviewed, patient examined, agree with above. CXR shows enlarging ptx and he is audibly wheezing when I entered the room with increased work of breathing. Will insert chest tube.

## 2019-07-29 NOTE — Plan of Care (Signed)

## 2019-07-29 NOTE — Progress Notes (Signed)
Received Pt this AM, Pt in chair, unable to eat breakfast, due to SOB and he said he is unable to swallow his food do to his breathing.  Pt is using his accessory muscles when breathing. Pt has been sitting in chair all night.  Pt did have CXR completed, I called radiology to have CXR updated and read STAT, due to increase SOB and complications noted this AM.   Jadene Pierini was then called and advised of the above, and he came and assessed the Pt, and advised that Pt may need intervention but will review with MD and get back with nursing staff. Will continue to monitor Pt at this time. Currently the Pt is a [place].

## 2019-07-29 NOTE — Progress Notes (Signed)
Patient ID: Larry [name], male   DOB: 24-Jan-1951, 69 y.o.   MRN: [id number] TCTS:  He says he is more comfortable and lungs sound better, less wheezing.  CXR shows resolution of ptx. Small intermittent air leak from chest tube. Continue to 20 cm suction.

## 2019-07-29 NOTE — Progress Notes (Signed)
Bartle MD at Bedside, chest tube place, Pt tolerated procedure well, he did desat to 86% on first attempt of placement of tube, but quickly recovered, no interventions required.  He remained in the upper 90's to 100% remainder of the procedure. Post procedure, Pt was resting comfortably in bed.  His SOB was decreased, and his wheezing was not audible as was at the beginning.   Pt was placed on wall suction to -20 cm, by Dr. Cyndia Bent CXR ordered for placement of chest tube follow up. Will continue to monitor patient.

## 2019-07-29 NOTE — CV Procedure (Signed)
Chest Tube Insertion Procedure Note  Indications:  Clinically significant Right Pneumothorax  Pre-operative Diagnosis:  Right Pneumothorax  Post-operative Diagnosis: Right Pneumothorax  Procedure Details  Informed consent was obtained for the procedure, including sedation.  Risks of lung perforation, hemorrhage, arrhythmia, and adverse drug reaction were discussed.   After sterile skin prep, using standard technique, a 20 French tube was placed in the right lateral 7th rib space.  Findings: Rush of air, no fluid  Estimated Blood Loss:  Minimal         Specimens:  None              Complications:  None; patient tolerated the procedure well.         Disposition: performed in 2C bed         Condition: stable  Attending Attestation: I performed the procedure.  There is visible 1+ air leak after insertion. CXR pending. Breath sounds much better on right and he says he can breath better, looks better.

## 2019-07-30 ENCOUNTER — Inpatient Hospital Stay (HOSPITAL_COMMUNITY): Payer: Medicare Other

## 2019-07-30 MED ORDER — GUAIFENESIN ER 600 MG PO TB12
1200.0000 mg | ORAL_TABLET | Freq: Two times a day (BID) | ORAL | Status: AC
  Administered 2019-07-30 – 2019-08-03 (×10): 1200 mg via ORAL
  Filled 2019-07-30 (×10): qty 2

## 2019-07-30 MED ORDER — METOPROLOL SUCCINATE ER 25 MG PO TB24
25.0000 mg | ORAL_TABLET | Freq: Every day | ORAL | Status: DC
  Administered 2019-07-30 – 2019-08-08 (×10): 25 mg via ORAL
  Filled 2019-07-30 (×11): qty 1

## 2019-07-30 MED ORDER — SODIUM CHLORIDE 0.9% FLUSH: 10.0000 mL | INTRAVENOUS | Status: DC | PRN

## 2019-07-30 MED ORDER — SODIUM CHLORIDE 0.9% FLUSH
10.0000 mL | Freq: Two times a day (BID) | INTRAVENOUS | Status: DC
  Administered 2019-07-31 – 2019-08-01 (×2): 10 mL

## 2019-07-30 NOTE — Progress Notes (Signed)
4 Days Post-Op Procedure(s) (LRB): VIDEO BRONCHOSCOPY WITH ENDOBRONCHIAL NAVIGATION (N/A) XI ROBOTIC ASSISTED THORASCOPY-RIGHT MIDDLE LOBE WEDGE RESECTION & SUPERIOR SEGMENTECTOMY (Right) Node Dissection (Right) Intercostal Nerve Block (Right) Subjective: Denies pain  Objective: Vital signs in last 24 hours: Temp:  [98.1 F (36.7 C)-98.8 F (37.1 C)] 98.4 F (36.9 C) (03/01 0728) Pulse Rate:  [110-122] 122 (03/01 0728) Cardiac Rhythm: Sinus tachycardia (03/01 0701) Resp:  [14-26] 20 (03/01 0728) BP: (114-161)/(81-102) 161/89 (03/01 0728) SpO2:  [97 %-100 %] 99 % (03/01 0734) FiO2 (%):  [28 %] 28 % (02/28 1342)  Hemodynamic parameters for last 24 hours:    Intake/Output from previous day: 02/28 0701 - 03/01 0700 In: 30 [I.V.:30] Out: 978 [Urine:525; Chest Tube:453] Intake/Output this shift: No intake/output data recorded.  General appearance: alert, cooperative and no distress Neurologic: intact Heart: tachy, regular Lungs: diminished breath sounds right base Abdomen: normal findings: soft, non-tender Small air leak  Lab Results: Recent Labs    07/28/19 0323  WBC 15.8*  HGB 8.9*  HCT 26.6*  PLT 240   BMET:  Recent Labs    07/28/19 0323  NA 136  K 3.8  CL 100  CO2 26  GLUCOSE 158*  BUN 10  CREATININE 0.85  CALCIUM 8.6*    PT/INR: No results for input(s): LABPROT, INR in the last 72 hours. ABG    Component Value Date/Time   PHART 7.418 07/27/2019 0502   HCO3 24.3 07/27/2019 0502   TCO2 30 07/26/2019 1251   ACIDBASEDEF 3.0 (H) 07/26/2019 1201   O2SAT 96.2 07/27/2019 0502   CBG (last 3)  No results for input(s): GLUCAP in the last 72 hours.  Assessment/Plan: S/P Procedure(s) (LRB): VIDEO BRONCHOSCOPY WITH ENDOBRONCHIAL NAVIGATION (N/A) XI ROBOTIC ASSISTED THORASCOPY-RIGHT MIDDLE LOBE WEDGE RESECTION & SUPERIOR SEGMENTECTOMY (Right) Node Dissection (Right) Intercostal Nerve Block (Right) -  CV- tachycardic and hypertensive, will add low dose  Beta blocker RESP- continue IS, add flutter and mucinex for right base atelectasis Small air leak- will try CT to water seal SCD + enoxaparin for DVT prophylaxis Continue ambulation   LOS: 4 days    Melrose Nakayama 07/30/2019

## 2019-07-30 NOTE — Plan of Care (Signed)
  Problem: Education: Goal: Knowledge of General Education information will improve Description: Including pain rating scale, medication(s)/side effects and non-pharmacologic comfort measures Outcome: Progressing   Problem: Health Behavior/Discharge Planning: Goal: Ability to manage health-related needs will improve Outcome: Progressing   Problem: Clinical Measurements: Goal: Will remain free from infection Outcome: Progressing Goal: Diagnostic test results will improve Outcome: Progressing Goal: Respiratory complications will improve Outcome: Progressing   Problem: Activity: Goal: Risk for activity intolerance will decrease Outcome: Progressing   Problem: Pain Managment: Goal: General experience of comfort will improve Outcome: Progressing

## 2019-07-31 ENCOUNTER — Inpatient Hospital Stay (HOSPITAL_COMMUNITY): Payer: Medicare Other

## 2019-07-31 LAB — BASIC METABOLIC PANEL
Anion gap: 11 (ref 5–15)
BUN: 15 mg/dL (ref 8–23)
CO2: 26 mmol/L (ref 22–32)
Calcium: 8.8 mg/dL — ABNORMAL LOW (ref 8.9–10.3)
Chloride: 101 mmol/L (ref 98–111)
Creatinine, Ser: 0.85 mg/dL (ref 0.61–1.24)
GFR calc Af Amer: >60 mL/min (ref >60)
GFR calc non Af Amer: >60 mL/min (ref >60)
Glucose, Bld: 129 mg/dL — ABNORMAL HIGH (ref 70–99)
Potassium: 3.6 mmol/L (ref 3.5–5.1)
Sodium: 138 mmol/L (ref 135–145)

## 2019-07-31 LAB — CBC
HCT: 28.1 % — ABNORMAL LOW (ref 39.0–52.0)
Hemoglobin: 9.2 g/dL — ABNORMAL LOW (ref 13.0–17.0)
MCH: 25.1 pg — ABNORMAL LOW (ref 26.0–34.0)
MCHC: 32.7 g/dL (ref 30.0–36.0)
MCV: 76.8 fL — ABNORMAL LOW (ref 80.0–100.0)
Platelets: 297 10*3/uL (ref 150–400)
RBC: 3.66 MIL/uL — ABNORMAL LOW (ref 4.22–5.81)
RDW: 16.9 % — ABNORMAL HIGH (ref 11.5–15.5)
WBC: 15 10*3/uL — ABNORMAL HIGH (ref 4.0–10.5)
nRBC: 0 % (ref 0.0–0.2)

## 2019-07-31 LAB — GLUCOSE, CAPILLARY: Glucose-Capillary: 132 mg/dL — ABNORMAL HIGH (ref 70–99)

## 2019-07-31 NOTE — Progress Notes (Addendum)
      [address]       [place] [address]             [phone]      5 Days Post-Op Procedure(s) (LRB): VIDEO BRONCHOSCOPY WITH ENDOBRONCHIAL NAVIGATION (N/A) XI ROBOTIC ASSISTED THORASCOPY-RIGHT MIDDLE LOBE WEDGE RESECTION & SUPERIOR SEGMENTECTOMY (Right) Node Dissection (Right) Intercostal Nerve Block (Right)   Subjective:  Patient without complaints.   Objective: Vital signs in last 24 hours: Temp:  [97.6 F (36.4 C)-98.3 F (36.8 C)] 97.6 F (36.4 C) (03/02 0307) Pulse Rate:  [93-113] 93 (03/02 0307) Cardiac Rhythm: Normal sinus rhythm (03/01 2312) Resp:  [16-26] 18 (03/02 0307) BP: (124-142)/(78-89) 124/78 (03/02 0307) SpO2:  [98 %-100 %] 100 % (03/02 0307)  Intake/Output from previous day: 03/01 0701 - 03/02 0700 In: 77.3 [I.V.:77.3] Out: 500 [Urine:400; Chest Tube:100]  General appearance: alert, cooperative and no distress Heart: regular rate and rhythm Lungs: clear to auscultation bilaterally Abdomen: soft, non-tender; bowel sounds normal; no masses,  no organomegaly Extremities: extremities normal, atraumatic, no cyanosis or edema Wound: clean and dry  Lab Results: Recent Labs    07/31/19 0500  WBC 15.0*  HGB 9.2*  HCT 28.1*  PLT 297   BMET:  Recent Labs    07/31/19 0500  NA 138  K 3.6  CL 101  CO2 26  GLUCOSE 129*  BUN 15  CREATININE 0.85  CALCIUM 8.8*    PT/INR: No results for input(s): LABPROT, INR in the last 72 hours. ABG    Component Value Date/Time   PHART 7.418 07/27/2019 0502   HCO3 24.3 07/27/2019 0502   TCO2 30 07/26/2019 1251   ACIDBASEDEF 3.0 (H) 07/26/2019 1201   O2SAT 96.2 07/27/2019 0502   CBG (last 3)  No results for input(s): GLUCAP in the last 72 hours.  Assessment/Plan: S/P Procedure(s) (LRB): VIDEO BRONCHOSCOPY WITH ENDOBRONCHIAL NAVIGATION (N/A) XI ROBOTIC ASSISTED THORASCOPY-RIGHT MIDDLE LOBE WEDGE RESECTION & SUPERIOR SEGMENTECTOMY (Right) Node Dissection (Right) Intercostal Nerve  Block (Right)  1. CV- NSR, BP improved- continue Lopressor 2. Pulm- CT intermittent 1+ air leak, there is some "sucking" sound with cough, reinforced dressing, leave chest tube on water seal ... CXR shows a trace right apical space 3. DVT prophylaxis on Lovenox, SCD 4. Dispo- patient stable, intermittent air leak, CXR with trace apical space this morning, will likely leave chest tube to water seal one more day as patient developed a pneumothorax post chest removal a few days ago, repeat CXR in AM   LOS: 5 days    Ellwood Handler, PA-C  07/31/2019 Patient seen and examined, agree with above Small air leak- keep Ct in place Dc central line Ambulate TID RLL atelectasis- IS and flutter + mucinex  Diallo Ponder C. Roxan Hockey, MD [facility] [phone]

## 2019-08-01 ENCOUNTER — Inpatient Hospital Stay (HOSPITAL_COMMUNITY): Payer: Medicare Other

## 2019-08-01 NOTE — TOC Transition Note (Signed)
Transition of Care [facility]) - CM/SW Discharge Note   Patient Details  Name: Larry [name] MRN: [id number] Date of Birth: 1950-07-22  [facility]) CM/SW Contact:  Zenon Mayo, RN Phone Number: 08/01/2019, 1:14 PM   Clinical Narrative:    NCM spoke with patient, he told NCM to call Mardene Celeste,  [place] contacted Mardene Celeste , she states she does not have a preference of which [facility] agency he works with.  She also states she would like [employer] to do the home oxygen.  Referral made to [facility] for the home oxygen and referral made to [place] with [facility] for HHPT. Tiffany states she is able to take referral.  Soc will begin 24 to 48 hrs post dc.    Final next level of care: [place] Barriers to Discharge: Continued Medical Work up   Patient Goals and CMS Choice Patient states their goals for this hospitalization and ongoing recovery are:: get better CMS Medicare.gov Compare Post Acute Care list provided to:: Patient Represenative (must comment)(Patricia) Choice offered to / list presented to : Mardene Celeste)  Discharge Placement                       Discharge Plan and Services                DME Arranged: Oxygen, Walker rolling with seat DME Agency: [facility] Date DME Agency Contacted: 08/01/19 Time DME Agency Contacted: [id number] Representative spoke with at DME Agency: [place]: PT [place]: [facility] [employer]) Date [place]: 08/01/19 Time [place]: [place] Representative spoke with at [place]: [place] ([place]) Interventions     Readmission Risk Interventions No flowsheet data found.

## 2019-08-01 NOTE — Evaluation (Signed)
Physical Therapy Evaluation Patient Details Name: Larry [name] MRN: [id number] DOB: 30-Jul-1950 Today's Date: 08/01/2019   History of Present Illness  Pt is a 69 y/o male S/p RML wedge resection, and superior segmentectomy of RLL, lymph node dissection, and intercostal nerve block secondary to nodules found on R middle lobe and R lower lobe nodules. PMH includes COPD, lung cancer, prostate cancer, and tobacco use.   Clinical Impression  Pt is s/p surgery above with deficits below. Pt fatiguing easily and required standing rests during mobility. Oxygen sats decreasing to 80% on RA, and required 3L to keep oxygen sats >90%. Educated about use of rollator at home and need for assist with meal prep, etc initially. Will continue to follow acutely to maximize functional mobility independence and safety.     Follow Up Recommendations Home health PT;Supervision for mobility/OOB    Equipment Recommendations  Other (comment)(4 wheeled walker with seat (rollator))    Recommendations for Other Services       Precautions / Restrictions Precautions Precautions: Fall;Other (comment) Precaution Comments: watch O2; chest tube  Restrictions Weight Bearing Restrictions: No      Mobility  Bed Mobility               General bed mobility comments: IN chair upon entry  Transfers Overall transfer level: Needs assistance Equipment used: Rolling walker (2 wheeled) Transfers: Sit to/from Stand Sit to Stand: Supervision         General transfer comment: Supervision for safety and line management.   Ambulation/Gait Ambulation/Gait assistance: Supervision;Min guard Gait Distance (Feet): 100 Feet Assistive device: Rolling walker (2 wheeled) Gait Pattern/deviations: Step-through pattern;Decreased stride length Gait velocity: decreased   General Gait Details: Slow, cautious gait. Attempted without O2 and oxygen sats dropping to 80% on RA. Required 3L of O2 to maintain oxygen sats. Required  standing rest and cues for pursed lip breathing during ambulation. Educated about using rollator for safety upon d/c.   Stairs            Wheelchair Mobility    Modified Rankin (Stroke Patients Only)       Balance Overall balance assessment: Needs assistance Sitting-balance support: No upper extremity supported;Feet supported Sitting balance-Leahy Scale: Good     Standing balance support: Bilateral upper extremity supported;During functional activity Standing balance-Leahy Scale: Poor Standing balance comment: Reliant on BUE support                             Pertinent Vitals/Pain Pain Assessment: No/denies pain    Home Living Family/patient expects to be discharged to:: Private residence Living Arrangements: Alone Available Help at Discharge: Family;Available PRN/intermittently Type of Home: House Home Access: Ramped entrance     Home Layout: One level Home Equipment: Shower seat;Cane - single point      Prior Function Level of Independence: Independent               Hand Dominance        Extremity/Trunk Assessment   Upper Extremity Assessment Upper Extremity Assessment: Defer to OT evaluation    Lower Extremity Assessment Lower Extremity Assessment: Generalized weakness    Cervical / Trunk Assessment Cervical / Trunk Assessment: Normal  Communication   Communication: No difficulties  Cognition Arousal/Alertness: Awake/alert Behavior During Therapy: WFL for tasks assessed/performed Overall Cognitive Status: Within Functional Limits for tasks assessed  General Comments General comments (skin integrity, edema, etc.): Educated about needing increased assist at d/c. Pt reports his sister would be at the hospital soon and would like for staff to talk to her about needs; notified RN.     Exercises     Assessment/Plan    PT Assessment Patient needs continued PT services  PT  Problem List Decreased activity tolerance;Decreased strength;Decreased mobility;Decreased knowledge of precautions;Cardiopulmonary status limiting activity       PT Treatment Interventions Gait training;Functional mobility training;Therapeutic activities;Therapeutic exercise;Patient/family education;Balance training    PT Goals (Current goals can be found in the Care Plan section)  Acute Rehab PT Goals Patient Stated Goal: to go home PT Goal Formulation: With patient Time For Goal Achievement: 08/15/19 Potential to Achieve Goals: Good    Frequency Min 3X/week   Barriers to discharge Decreased caregiver support      Co-evaluation               AM-PAC PT "6 Clicks" Mobility  Outcome Measure Help needed turning from your back to your side while in a flat bed without using bedrails?: A Little Help needed moving from lying on your back to sitting on the side of a flat bed without using bedrails?: A Little Help needed moving to and from a bed to a chair (including a wheelchair)?: A Little Help needed standing up from a chair using your arms (e.g., wheelchair or bedside chair)?: A Little Help needed to walk in hospital room?: A Little Help needed climbing 3-5 steps with a railing? : A Little 6 Click Score: 18    End of Session Equipment Utilized During Treatment: Oxygen Activity Tolerance: Patient limited by fatigue Patient left: in chair;with call bell/phone within reach Nurse Communication: Mobility status PT Visit Diagnosis: Difficulty in walking, not elsewhere classified (R26.2)    Time: 4742-5956 PT Time Calculation (min) (ACUTE ONLY): 21 min   Charges:   PT Evaluation $PT Eval Moderate Complexity: 1 Mod          Lou Miner, DPT  Acute Rehabilitation Services  Pager: [phone] Office: [phone]   Rudean Hitt 08/01/2019, 11:31 AM

## 2019-08-01 NOTE — Progress Notes (Addendum)
      [address] [place]       [employer] [address]             [phone]      6 Days Post-Op Procedure(s) (LRB): VIDEO BRONCHOSCOPY WITH ENDOBRONCHIAL NAVIGATION (N/A) XI ROBOTIC ASSISTED THORASCOPY-RIGHT MIDDLE LOBE WEDGE RESECTION & SUPERIOR SEGMENTECTOMY (Right) Node Dissection (Right) Intercostal Nerve Block (Right)   Subjective:  Patient states doing okay.  Per nursing he walked yesterday and did not do well.  He desaturated into the low 80s.  He also couldn't make it very far and they had to get him in a chair to get him back to his room.   Objective: Vital signs in last 24 hours: Temp:  [97.9 F (36.6 C)-98.2 F (36.8 C)] 98 F (36.7 C) (03/03 0329) Pulse Rate:  [90-105] 90 (03/03 0329) Cardiac Rhythm: Normal sinus rhythm (03/03 0701) Resp:  [18-24] 20 (03/03 0329) BP: (127-136)/(73-86) 127/86 (03/03 0329) SpO2:  [93 %-100 %] 95 % (03/03 0329)  Intake/Output from previous day: 03/02 0701 - 03/03 0700 In: 18 [I.V.:18] Out: 1025 [Urine:975; Chest Tube:50]  General appearance: alert, cooperative and no distress Heart: regular rate and rhythm Lungs: coarse bilaterally Abdomen: soft, non-tender; bowel sounds normal; no masses,  no organomegaly Extremities: extremities normal, atraumatic, no cyanosis or edema Wound: clean and dry  Lab Results: Recent Labs    07/31/19 0500  WBC 15.0*  HGB 9.2*  HCT 28.1*  PLT 297   BMET:  Recent Labs    07/31/19 0500  NA 138  K 3.6  CL 101  CO2 26  GLUCOSE 129*  BUN 15  CREATININE 0.85  CALCIUM 8.8*    PT/INR: No results for input(s): LABPROT, INR in the last 72 hours. ABG    Component Value Date/Time   PHART 7.418 07/27/2019 0502   HCO3 24.3 07/27/2019 0502   TCO2 30 07/26/2019 1251   ACIDBASEDEF 3.0 (H) 07/26/2019 1201   O2SAT 96.2 07/27/2019 0502   CBG (last 3)  Recent Labs    07/31/19 1638  GLUCAP 132*    Assessment/Plan: S/P Procedure(s) (LRB): VIDEO BRONCHOSCOPY WITH ENDOBRONCHIAL  NAVIGATION (N/A) XI ROBOTIC ASSISTED THORASCOPY-RIGHT MIDDLE LOBE WEDGE RESECTION & SUPERIOR SEGMENTECTOMY (Right) Node Dissection (Right) Intercostal Nerve Block (Right)  1. CV- NSR, BP stable- continue Lopressor 2. Pulm- CT with air leak, however there is air leaking around chest tube, when pressure applied to chest tube site air leak resolves.. CXR remains stable with trace apical space... defer chest tube to Dr. Roxan Hockey 3. Hypoxia, Deconditioning- desats with ambulation, will likely require home O2, get PT consult to ensure safety for discharge 4. DVT prophylaxis continue Lovenox 5. Dispo- patient stable, I think air leak is from air leaking around chest tube, CXR remains stable, will defer to Dr. Roxan Hockey on chest tube management, deconditioning- per nursing patient not able to ambulate far, his oxygen levels are dropping with ambulation.. will arrange home oxygen, continue current care   LOS: 6 days    Ellwood Handler, PA-C 08/01/2019 Patient seen and examined, agree with above He still has a small air leak- keep tube on water seal today  Remo Lipps C. Roxan Hockey, MD [facility] [phone]

## 2019-08-01 NOTE — Progress Notes (Signed)
SATURATION QUALIFICATIONS: (This note is used to comply with regulatory documentation for home oxygen)  Patient Saturations on Room Air at Rest = 91%  Patient Saturations on Room Air while Ambulating = 80%  Patient Saturations on 3 Liters of oxygen while Ambulating = 90%  Please briefly explain why patient needs home oxygen: Oxygen saturations decreasing to 80% during ambulation on RA. Required 3L of supplemental oxygen to return to >90%. Pt will require supplemental oxygen to maintain adequate oxygen saturations.   Reuel Derby, PT, DPT  Acute Rehabilitation Services  Pager: [phone] Office: [phone]

## 2019-08-02 ENCOUNTER — Other Ambulatory Visit: Payer: Self-pay | Admitting: *Deleted

## 2019-08-02 ENCOUNTER — Inpatient Hospital Stay (HOSPITAL_COMMUNITY): Payer: Medicare Other

## 2019-08-02 NOTE — Plan of Care (Signed)

## 2019-08-02 NOTE — Progress Notes (Signed)
The proposed treatment discussed in cancer conference 08/02/19 is for discussion purpose only and is not a binding recommendation.  The patient was not physically examined nor present for their treatment options.  Therefore, final treatment plans cannot be decided.

## 2019-08-02 NOTE — Progress Notes (Signed)
Per Junie Panning, PA chest tube drainage system changed from sahara to mini express.   Patient tolerated well.

## 2019-08-02 NOTE — Evaluation (Addendum)
Occupational Therapy Evaluation Patient Details Name: Larry [name] MRN: [id number] DOB: 03-25-1951 Today's Date: 08/02/2019    History of Present Illness Pt is a 69 y/o male S/p RML wedge resection, and superior segmentectomy of RLL, lymph node dissection, and intercostal nerve block secondary to nodules found on R middle lobe and R lower lobe nodules. PMH includes COPD, lung cancer, prostate cancer, and tobacco use.    Clinical Impression   PTA patient independent and driving.  Admitted for above and limited by problem list below, including impaired balance, decreased activity tolerance, pain, and generalized weakness. Patient currently requires min guard for transfers and in room mobility using RW, min assist for LB ADls and setup for UB ADLs.  Initiated educated on energy conservation and safety.  VSS on 3L [place]. He will benefit from continued OT services while admitted and after dc at [facility] level in order to optimize independence, safety with ADLs, mobility, and IADLs.     Follow Up Recommendations  Home health OT;Supervision - Intermittent(may progress to no follow up)    Equipment Recommendations  None recommended by OT    Recommendations for Other Services       Precautions / Restrictions Precautions Precautions: Fall;Other (comment) Precaution Comments: watch O2; chest tube  Restrictions Weight Bearing Restrictions: No      Mobility Bed Mobility Overal bed mobility: Needs Assistance Bed Mobility: Sit to Supine       Sit to supine: Supervision   General bed mobility comments: for line mgmt  Transfers Overall transfer level: Needs assistance Equipment used: Rolling walker (2 wheeled) Transfers: Sit to/from Stand Sit to Stand: Min guard         General transfer comment: min guard for safety and balance, cueing for hand placement     Balance Overall balance assessment: Needs assistance Sitting-balance support: No upper extremity supported;Feet supported Sitting  balance-Leahy Scale: Good     Standing balance support: Bilateral upper extremity supported;During functional activity;No upper extremity supported Standing balance-Leahy Scale: Fair Standing balance comment: min guard to supervision for safety                            ADL either performed or assessed with clinical judgement   ADL Overall ADL's : Needs assistance/impaired     Grooming: Set up;Sitting   Upper Body Bathing: Set up;Sitting   Lower Body Bathing: Minimal assistance;Sit to/from stand   Upper Body Dressing : Set up;Sitting   Lower Body Dressing: Minimal assistance;Sit to/from stand Lower Body Dressing Details (indicate cue type and reason): able to manage L sock, assist with R; min guard sit to stand  Toilet Transfer: Min guard;Ambulation Toilet Transfer Details (indicate cue type and reason): simulated from recliner using RW         Functional mobility during ADLs: Min guard;Rolling walker;Cueing for safety General ADL Comments: pt limited by weakness, impaired balance and pain      Vision         Perception     Praxis      Pertinent Vitals/Pain Pain Assessment: Faces Faces Pain Scale: Hurts a little bit Pain Location: chest tube site Pain Descriptors / Indicators: Discomfort Pain Intervention(s): Monitored during session;Repositioned     Hand Dominance     Extremity/Trunk Assessment Upper Extremity Assessment Upper Extremity Assessment: Overall WFL for tasks assessed   Lower Extremity Assessment Lower Extremity Assessment: Defer to PT evaluation   Cervical / Trunk Assessment Cervical / Trunk  Assessment: Normal   Communication Communication Communication: No difficulties   Cognition Arousal/Alertness: Awake/alert Behavior During Therapy: WFL for tasks assessed/performed Overall Cognitive Status: Within Functional Limits for tasks assessed                                     General Comments  on 3L with Spo2  maintained, VSS     Exercises     Shoulder Instructions      Home Living Family/patient expects to be discharged to:: Private residence Living Arrangements: Alone Available Help at Discharge: Family;Available PRN/intermittently Type of Home: House Home Access: Ramped entrance     Home Layout: One level     Bathroom Shower/Tub: Teacher, early years/pre: Standard     Home Equipment: Shower seat;Cane - single point;Toilet riser          Prior Functioning/Environment Level of Independence: Independent                 OT Problem List: Decreased activity tolerance;Impaired balance (sitting and/or standing);Decreased safety awareness;Decreased knowledge of use of DME or AE;Decreased knowledge of precautions;Cardiopulmonary status limiting activity;Pain      OT Treatment/Interventions: Self-care/ADL training;Therapeutic exercise;DME and/or AE instruction;Therapeutic activities;Patient/family education;Balance training    OT Goals(Current goals can be found in the care plan section) Acute Rehab OT Goals Patient Stated Goal: to go home OT Goal Formulation: With patient Time For Goal Achievement: 08/16/19 Potential to Achieve Goals: Good  OT Frequency: Min 2X/week   Barriers to D/C:            Co-evaluation              AM-PAC OT "6 Clicks" Daily Activity     Outcome Measure Help from another person eating meals?: None Help from another person taking care of personal grooming?: A Little Help from another person toileting, which includes using toliet, bedpan, or urinal?: A Little Help from another person bathing (including washing, rinsing, drying)?: A Little Help from another person to put on and taking off regular upper body clothing?: A Little Help from another person to put on and taking off regular lower body clothing?: A Little 6 Click Score: 19   End of Session Equipment Utilized During Treatment: Rolling walker;Oxygen(3L) Nurse  Communication: Mobility status  Activity Tolerance: Patient tolerated treatment well Patient left: in bed;with call bell/phone within reach;with bed alarm set  OT Visit Diagnosis: Muscle weakness (generalized) (M62.81);Unsteadiness on feet (R26.81);Pain Pain - Right/Left: Right Pain - part of body: (flank, chest tube site)                Time: 8177-1165 OT Time Calculation (min): 12 min Charges:  OT General Charges $OT Visit: 1 Visit OT Evaluation $OT Eval Moderate Complexity: 1 Mod  Jolaine Artist, OT Acute Rehabilitation Services Pager [phone] Office [phone]   Delight Stare 08/02/2019, 11:13 AM

## 2019-08-02 NOTE — Progress Notes (Signed)
Physical Therapy Treatment Patient Details Name: Larry [name] MRN: [id number] DOB: [birth year] Today's Date: 08/02/2019    History of Present Illness Pt is a 69 y/o male S/p RML wedge resection, and superior segmentectomy of RLL, lymph node dissection, and intercostal nerve block secondary to nodules found on R middle lobe and R lower lobe nodules. PMH includes COPD, lung cancer, prostate cancer, and tobacco use.     PT Comments    Pt tolerated treatment well, increasing ambulation tolerance on 3L [employer]. Pt requiring some brief standing rest breaks due to fatigue. PT provides education on Rollator use, locking brakes during transfers for improved safety. Pt will benefit from continued acute PT POC to restore independence in all mobility.   Follow Up Recommendations  Home health PT;Supervision for mobility/OOB     Equipment Recommendations  (4 wheeled walker with seat, Rollator)    Recommendations for Other Services       Precautions / Restrictions Precautions Precautions: Fall;Other (comment) Precaution Comments: watch O2; chest tube  Restrictions Weight Bearing Restrictions: No    Mobility  Bed Mobility Overal bed mobility: Modified Independent                Transfers Overall transfer level: Needs assistance Equipment used: 4-wheeled walker Transfers: Sit to/from Stand Sit to Stand: Supervision         General transfer comment: PT providing cues to lock brakes on Rollator  Ambulation/Gait Ambulation/Gait assistance: Supervision Gait Distance (Feet): 400 Feet Assistive device: 4-wheeled walker Gait Pattern/deviations: Step-to pattern Gait velocity: decreased Gait velocity interpretation: 1.31 - 2.62 ft/sec, indicative of limited community ambulator General Gait Details: pt with slowed step to gait, reduced gait speed, pt taking 3 ~1 minute standing rest breaks due to fatigue   Stairs             Wheelchair Mobility    Modified Rankin (Stroke  Patients Only)       Balance Overall balance assessment: Needs assistance Sitting-balance support: No upper extremity supported;Feet supported Sitting balance-Leahy Scale: Good     Standing balance support: Single extremity supported Standing balance-Leahy Scale: Good Standing balance comment: supervision                            Cognition Arousal/Alertness: Awake/alert Behavior During Therapy: WFL for tasks assessed/performed Overall Cognitive Status: Within Functional Limits for tasks assessed                                        Exercises      General Comments General comments (skin integrity, edema, etc.): pt on 3L [place] during activity, one instance of desat below 90% to 89% during coughing, pt recovers to 90% and above within 1 minute      Pertinent Vitals/Pain Pain Assessment: No/denies pain    Home Living                      Prior Function            PT Goals (current goals can now be found in the care plan section) Acute Rehab PT Goals Patient Stated Goal: to go home Progress towards PT goals: Progressing toward goals    Frequency    Min 3X/week      PT Plan Current plan remains appropriate    Co-evaluation  AM-PAC PT "6 Clicks" Mobility   Outcome Measure  Help needed turning from your back to your side while in a flat bed without using bedrails?: None Help needed moving from lying on your back to sitting on the side of a flat bed without using bedrails?: None Help needed moving to and from a bed to a chair (including a wheelchair)?: None Help needed standing up from a chair using your arms (e.g., wheelchair or bedside chair)?: None Help needed to walk in hospital room?: None Help needed climbing 3-5 steps with a railing? : A Little 6 Click Score: 23    End of Session Equipment Utilized During Treatment: Oxygen Activity Tolerance: Patient tolerated treatment well Patient left: in  chair;with call bell/phone within reach Nurse Communication: Mobility status PT Visit Diagnosis: Difficulty in walking, not elsewhere classified (R26.2)     Time: 1173-5670 PT Time Calculation (min) (ACUTE ONLY): 25 min  Charges:  $Gait Training: 23-37 mins                     Zenaida Niece, PT, DPT Acute Rehabilitation Pager: [phone]    Zenaida Niece 08/02/2019, 10:12 AM

## 2019-08-02 NOTE — Progress Notes (Addendum)
      [address]       [employer] [address]             [phone]      7 Days Post-Op Procedure(s) (LRB): VIDEO BRONCHOSCOPY WITH ENDOBRONCHIAL NAVIGATION (N/A) XI ROBOTIC ASSISTED THORASCOPY-RIGHT MIDDLE LOBE WEDGE RESECTION & SUPERIOR SEGMENTECTOMY (Right) Node Dissection (Right) Intercostal Nerve Block (Right)   Subjective:  No specific complaints.  States he is doing okay. He participated with PT yesterday who recs home PT  Objective: Vital signs in last 24 hours: Temp:  [97.9 F (36.6 C)-98.2 F (36.8 C)] 98 F (36.7 C) (03/04 0350) Pulse Rate:  [96-102] 96 (03/04 0350) Cardiac Rhythm: Normal sinus rhythm (03/04 0700) Resp:  [20] 20 (03/03 2330) BP: (134-137)/(76-86) 137/81 (03/04 0350) SpO2:  [94 %-100 %] 96 % (03/04 0350)  Intake/Output from previous day: 03/03 0701 - 03/04 0700 In: 10 [I.V.:10] Out: 970 [Urine:875; Chest Tube:95]  General appearance: alert, cooperative and no distress Heart: regular rate and rhythm Lungs: clear to auscultation bilaterally Abdomen: soft, non-tender; bowel sounds normal; no masses,  no organomegaly Extremities: extremities normal, atraumatic, no cyanosis or edema Wound: clean and dry  Lab Results: Recent Labs    07/31/19 0500  WBC 15.0*  HGB 9.2*  HCT 28.1*  PLT 297   BMET:  Recent Labs    07/31/19 0500  NA 138  K 3.6  CL 101  CO2 26  GLUCOSE 129*  BUN 15  CREATININE 0.85  CALCIUM 8.8*    PT/INR: No results for input(s): LABPROT, INR in the last 72 hours. ABG    Component Value Date/Time   PHART 7.418 07/27/2019 0502   HCO3 24.3 07/27/2019 0502   TCO2 30 07/26/2019 1251   ACIDBASEDEF 3.0 (H) 07/26/2019 1201   O2SAT 96.2 07/27/2019 0502   CBG (last 3)  Recent Labs    07/31/19 1638  GLUCAP 132*    Assessment/Plan: S/P Procedure(s) (LRB): VIDEO BRONCHOSCOPY WITH ENDOBRONCHIAL NAVIGATION (N/A) XI ROBOTIC ASSISTED THORASCOPY-RIGHT MIDDLE LOBE WEDGE RESECTION & SUPERIOR  SEGMENTECTOMY (Right) Node Dissection (Right) Intercostal Nerve Block (Right)  1. CV- NSR, BP controlled on Lopressor 2. Pulm- CT + air leak with cough, CXR shows improvement in apical chest tube, leave in place today  3. Hypoxia, patient uses oxygen at home, has been brought to the room 4. DVT prophylaxis continue Lovenox 5. Deconditioning- will need home PT, orders have been placed, rollator ordered 6. Dispo- patient with air leak on water seal, CXR shows some improvement in apical pneumothorax, continue current care   LOS: 7 days    Ellwood Handler, PA-C  08/02/2019 Patient seen and examined, agree with above Still has a small air leak. Lung well expanded with a tiny residual apical pneumo on water seal  Remo Lipps C. Roxan Hockey, MD [facility] [phone]

## 2019-08-03 ENCOUNTER — Inpatient Hospital Stay (HOSPITAL_COMMUNITY): Payer: Medicare Other

## 2019-08-03 NOTE — Progress Notes (Signed)
      [address]       [employer] [address]             [phone]      8 Days Post-Op Procedure(s) (LRB): VIDEO BRONCHOSCOPY WITH ENDOBRONCHIAL NAVIGATION (N/A) XI ROBOTIC ASSISTED THORASCOPY-RIGHT MIDDLE LOBE WEDGE RESECTION & SUPERIOR SEGMENTECTOMY (Right) Node Dissection (Right) Intercostal Nerve Block (Right)   Subjective:  No new complaints.  He is okay going home with a chest tube if that's what is needed.  Objective: Vital signs in last 24 hours: Temp:  [98 F (36.7 C)-99.2 F (37.3 C)] 98 F (36.7 C) (03/05 0339) Pulse Rate:  [88-111] 88 (03/05 0036) Cardiac Rhythm: Normal sinus rhythm (03/05 0334) Resp:  [17-26] 17 (03/05 0339) BP: (127-139)/(69-78) 128/78 (03/05 0339) SpO2:  [95 %-97 %] 95 % (03/05 0339)  Intake/Output from previous day: 03/04 0701 - 03/05 0700 In: 13 [I.V.:13] Out: 60 [Chest Tube:60]  General appearance: alert, cooperative and no distress Heart: regular rate and rhythm Lungs: clear to auscultation bilaterally Abdomen: soft, non-tender; bowel sounds normal; no masses,  no organomegaly Extremities: extremities normal, atraumatic, no cyanosis or edema Wound: clean and dry  Lab Results: No results for input(s): WBC, HGB, HCT, PLT in the last 72 hours. BMET: No results for input(s): NA, K, CL, CO2, GLUCOSE, BUN, CREATININE, CALCIUM in the last 72 hours.  PT/INR: No results for input(s): LABPROT, INR in the last 72 hours. ABG    Component Value Date/Time   PHART 7.418 07/27/2019 0502   HCO3 24.3 07/27/2019 0502   TCO2 30 07/26/2019 1251   ACIDBASEDEF 3.0 (H) 07/26/2019 1201   O2SAT 96.2 07/27/2019 0502   CBG (last 3)  Recent Labs    07/31/19 1638  GLUCAP 132*    Assessment/Plan: S/P Procedure(s) (LRB): VIDEO BRONCHOSCOPY WITH ENDOBRONCHIAL NAVIGATION (N/A) XI ROBOTIC ASSISTED THORASCOPY-RIGHT MIDDLE LOBE WEDGE RESECTION & SUPERIOR SEGMENTECTOMY (Right) Node Dissection (Right) Intercostal Nerve Block  (Right)  1. CV-remains hemodynamically stable on Lopressor 2. Pulm- transitioned chest tube to Mini Express yesterday, + air leak with cough.. CXR order placed for this morning, will review once completed 3. Hypoxia- per patient on oxygen prior to admission, home use for continuous has been arranged 4. Lovenox for DVT prophylaxis 5. Dispo- patient stable, await CXR results, I suspect if no pneumothorax is present will can d/c patient home today with CT on Mini Express, H/H RN and PT have been arranged   LOS: 8 days    Ellwood Handler, PA-C  08/03/2019

## 2019-08-03 NOTE — Plan of Care (Signed)
  Problem: Clinical Measurements: Goal: Will remain free from infection Outcome: Progressing Goal: Diagnostic test results will improve Outcome: Progressing Goal: Respiratory complications will improve Outcome: Progressing Goal: Cardiovascular complication will be avoided Outcome: Progressing   Problem: Activity: Goal: Risk for activity intolerance will decrease Outcome: Progressing   Problem: Nutrition: Goal: Adequate nutrition will be maintained Outcome: Progressing   Problem: Elimination: Goal: Will not experience complications related to bowel motility Outcome: Progressing Goal: Will not experience complications related to urinary retention Outcome: Progressing   Problem: Pain Managment: Goal: General experience of comfort will improve Outcome: Progressing   Problem: Safety: Goal: Ability to remain free from injury will improve Outcome: Progressing   Problem: Skin Integrity: Goal: Risk for impaired skin integrity will decrease Outcome: Progressing

## 2019-08-04 ENCOUNTER — Inpatient Hospital Stay (HOSPITAL_COMMUNITY): Payer: Medicare Other

## 2019-08-04 NOTE — Plan of Care (Signed)
  Problem: Clinical Measurements: Goal: Will remain free from infection Outcome: Progressing Goal: Diagnostic test results will improve Outcome: Progressing Goal: Respiratory complications will improve Outcome: Progressing Goal: Cardiovascular complication will be avoided Outcome: Progressing   Problem: Activity: Goal: Risk for activity intolerance will decrease Outcome: Progressing   Problem: Nutrition: Goal: Adequate nutrition will be maintained Outcome: Progressing   Problem: Elimination: Goal: Will not experience complications related to bowel motility Outcome: Progressing Goal: Will not experience complications related to urinary retention Outcome: Progressing   Problem: Pain Managment: Goal: General experience of comfort will improve Outcome: Progressing   Problem: Safety: Goal: Ability to remain free from injury will improve Outcome: Progressing   Problem: Skin Integrity: Goal: Risk for impaired skin integrity will decrease Outcome: Progressing

## 2019-08-04 NOTE — Progress Notes (Addendum)
      [address]       [employer] [address]             [phone]      9 Days Post-Op Procedure(s) (LRB): VIDEO BRONCHOSCOPY WITH ENDOBRONCHIAL NAVIGATION (N/A) XI ROBOTIC ASSISTED THORASCOPY-RIGHT MIDDLE LOBE WEDGE RESECTION & SUPERIOR SEGMENTECTOMY (Right) Node Dissection (Right) Intercostal Nerve Block (Right)   Subjective:  No new complaints.  He is coughing up a little bit of sputum.  Also wants to make sure chest tube is okay, as he states it got pulled on.  He lives alone and is not comfortable going home with chest tube in place  Objective: Vital signs in last 24 hours: Temp:  [97.8 F (36.6 C)-99.1 F (37.3 C)] 97.8 F (36.6 C) (03/06 0712) Pulse Rate:  [79-94] 88 (03/06 0712) Cardiac Rhythm: Normal sinus rhythm (03/06 0712) Resp:  [15-20] 19 (03/06 0712) BP: (109-134)/(67-79) 109/69 (03/06 0712) SpO2:  [95 %-99 %] 95 % (03/06 0712)  Intake/Output from previous day: 03/05 0701 - 03/06 0700 In: 840 [P.O.:840] Out: 90 [Chest Tube:90] Intake/Output this shift: Total I/O In: 240 [P.O.:240] Out: 60 [Chest Tube:60]  General appearance: alert, cooperative and no distress Heart: regular rate and rhythm Lungs: clear to auscultation bilaterally Abdomen: soft, non-tender; bowel sounds normal; no masses,  no organomegaly Extremities: extremities normal, atraumatic, no cyanosis or edema Wound: clean and dry  Lab Results: No results for input(s): WBC, HGB, HCT, PLT in the last 72 hours. BMET: No results for input(s): NA, K, CL, CO2, GLUCOSE, BUN, CREATININE, CALCIUM in the last 72 hours.  PT/INR: No results for input(s): LABPROT, INR in the last 72 hours. ABG    Component Value Date/Time   PHART 7.418 07/27/2019 0502   HCO3 24.3 07/27/2019 0502   TCO2 30 07/26/2019 1251   ACIDBASEDEF 3.0 (H) 07/26/2019 1201   O2SAT 96.2 07/27/2019 0502   CBG (last 3)  No results for input(s): GLUCAP in the last 72 hours.  Assessment/Plan: S/P Procedure(s)  (LRB): VIDEO BRONCHOSCOPY WITH ENDOBRONCHIAL NAVIGATION (N/A) XI ROBOTIC ASSISTED THORASCOPY-RIGHT MIDDLE LOBE WEDGE RESECTION & SUPERIOR SEGMENTECTOMY (Right) Node Dissection (Right) Intercostal Nerve Block (Right)  1. CV- hemodynamically stable on Lopressor, norvasc 2. Pulm- CT on Mini Express, stable trace right apical pneumothorax... there is a small air leak with cough,   CXR showing evidence of atelectasis, infiltrate in RLL, repeat CXR in AM 3. ID- patient is afebrile, last WBC was 15, will repeat CBC in AM.. 4. Lovenox for DVT prophylaxis 5. Deconditioning- patient lives alone, home oxygen has been arranged, he is uncomfortable going home with a chest tube in place 6. Dispo- patient stable, will leave chest tube in place today, repeat CXR, CBC in AM    LOS: 9 days    Ellwood Handler, PA-C  08/04/2019

## 2019-08-05 ENCOUNTER — Inpatient Hospital Stay (HOSPITAL_COMMUNITY): Payer: Medicare Other

## 2019-08-05 LAB — CBC
HCT: 26.6 % — ABNORMAL LOW (ref 39.0–52.0)
Hemoglobin: 8.9 g/dL — ABNORMAL LOW (ref 13.0–17.0)
MCH: 25 pg — ABNORMAL LOW (ref 26.0–34.0)
MCHC: 33.5 g/dL (ref 30.0–36.0)
MCV: 74.7 fL — ABNORMAL LOW (ref 80.0–100.0)
Platelets: 406 10*3/uL — ABNORMAL HIGH (ref 150–400)
RBC: 3.56 MIL/uL — ABNORMAL LOW (ref 4.22–5.81)
RDW: 16.4 % — ABNORMAL HIGH (ref 11.5–15.5)
WBC: 12.1 10*3/uL — ABNORMAL HIGH (ref 4.0–10.5)
nRBC: 0 % (ref 0.0–0.2)

## 2019-08-05 MED ORDER — GUAIFENESIN 100 MG/5ML PO SOLN
5.0000 mL | ORAL | Status: DC | PRN
  Administered 2019-08-05: 20:00:00 100 mg via ORAL
  Filled 2019-08-05: qty 5

## 2019-08-05 NOTE — Plan of Care (Signed)
  Problem: Clinical Measurements: Goal: Will remain free from infection Outcome: Progressing Goal: Diagnostic test results will improve Outcome: Progressing Goal: Respiratory complications will improve Outcome: Progressing Goal: Cardiovascular complication will be avoided Outcome: Progressing   Problem: Activity: Goal: Risk for activity intolerance will decrease Outcome: Progressing   Problem: Nutrition: Goal: Adequate nutrition will be maintained Outcome: Progressing   Problem: Elimination: Goal: Will not experience complications related to bowel motility Outcome: Progressing Goal: Will not experience complications related to urinary retention Outcome: Progressing   Problem: Pain Managment: Goal: General experience of comfort will improve Outcome: Progressing   Problem: Safety: Goal: Ability to remain free from injury will improve Outcome: Progressing   Problem: Skin Integrity: Goal: Risk for impaired skin integrity will decrease Outcome: Progressing

## 2019-08-05 NOTE — Progress Notes (Addendum)
      [address]       [place] [address]             [phone]      10 Days Post-Op Procedure(s) (LRB): VIDEO BRONCHOSCOPY WITH ENDOBRONCHIAL NAVIGATION (N/A) XI ROBOTIC ASSISTED THORASCOPY-RIGHT MIDDLE LOBE WEDGE RESECTION & SUPERIOR SEGMENTECTOMY (Right) Node Dissection (Right) Intercostal Nerve Block (Right)   Subjective:  No new complaints.  Wants to stop wearing oxygen and telemetry pack.  Explained to patient need for telemetry pack.  O2 sats were 100%  Objective: Vital signs in last 24 hours: Temp:  [97.8 F (36.6 C)-98.5 F (36.9 C)] 98.5 F (36.9 C) (03/07 0709) Pulse Rate:  [76-90] 80 (03/07 0709) Cardiac Rhythm: Normal sinus rhythm (03/07 0709) Resp:  [14-20] 20 (03/07 0709) BP: (111-126)/(68-77) 111/68 (03/07 0709) SpO2:  [99 %-100 %] 100 % (03/07 0709)  Intake/Output from previous day: 03/06 0701 - 03/07 0700 In: 720 [P.O.:720] Out: 60 [Chest Tube:60] Intake/Output this shift: Total I/O In: 240 [P.O.:240] Out: 660 [Urine:600; Chest Tube:60]  General appearance: alert, cooperative and no distress Heart: regular rate and rhythm Lungs: clear to auscultation bilaterally Abdomen: soft, non-tender; bowel sounds normal; no masses,  no organomegaly Extremities: extremities normal, atraumatic, no cyanosis or edema Wound: clean and dry  Lab Results: Recent Labs    08/05/19 0217  WBC 12.1*  HGB 8.9*  HCT 26.6*  PLT 406*   BMET: No results for input(s): NA, K, CL, CO2, GLUCOSE, BUN, CREATININE, CALCIUM in the last 72 hours.  PT/INR: No results for input(s): LABPROT, INR in the last 72 hours. ABG    Component Value Date/Time   PHART 7.418 07/27/2019 0502   HCO3 24.3 07/27/2019 0502   TCO2 30 07/26/2019 1251   ACIDBASEDEF 3.0 (H) 07/26/2019 1201   O2SAT 96.2 07/27/2019 0502   CBG (last 3)  No results for input(s): GLUCAP in the last 72 hours.  Assessment/Plan: S/P Procedure(s) (LRB): VIDEO BRONCHOSCOPY WITH ENDOBRONCHIAL  NAVIGATION (N/A) XI ROBOTIC ASSISTED THORASCOPY-RIGHT MIDDLE LOBE WEDGE RESECTION & SUPERIOR SEGMENTECTOMY (Right) Node Dissection (Right) Intercostal Nerve Block (Right)  1. CV- hemodynamically stable, continue Lopressor, Norvasc 2. Pulm- CT to Mini Express, no air leak appreciated, CXR with stable appearance of trace apical pneumothorax... will discuss possibly clamping CT with Dr. Kipp Brood 3. Continue Lovenox for DVT prophylaxis 4. Deconditioning- home health arrangements have been made, patient isn't comfortable leaving with chest tube 5. Dispo- patient stable, CXR stable, will discuss possibly clamping CT with Dr. Kipp Brood, repeat CXR in AM   LOS: 10 days    Ellwood Handler, PA-C  08/05/2019   Agree with above Clamp trial tomorrow Continue pulm toilet.  Karolina Zamor Bary Leriche

## 2019-08-06 ENCOUNTER — Inpatient Hospital Stay (HOSPITAL_COMMUNITY): Payer: Medicare Other

## 2019-08-06 ENCOUNTER — Ambulatory Visit: Payer: Medicare Other | Admitting: Emergency Medicine

## 2019-08-06 NOTE — Progress Notes (Signed)
During assessment, chest tube was clamped by PA, per the Pt, CRX was to be done at noon, and determination by 1PM. I asked the Pt to let me know if he experienced any SOB, chest pain immedicatly and I would notify the team.  Spoke to [place], Junie Panning told Lilia Pro that the Pt's Chest Tube was clamped - but no further instructions were given or orders received.

## 2019-08-06 NOTE — Plan of Care (Signed)
  Problem: Safety: Goal: Ability to remain free from injury will improve 08/06/2019 0713 by Shanon Ace, RN Outcome: Progressing 08/06/2019 0713 by Shanon Ace, RN Outcome: Progressing   Problem: Skin Integrity: Goal: Risk for impaired skin integrity will decrease 08/06/2019 0713 by Shanon Ace, RN Outcome: Progressing 08/06/2019 0713 by Shanon Ace, RN Outcome: Progressing

## 2019-08-06 NOTE — Plan of Care (Signed)
  Problem: Clinical Measurements: Goal: Will remain free from infection Outcome: Progressing Goal: Diagnostic test results will improve Outcome: Progressing Goal: Respiratory complications will improve Outcome: Progressing Goal: Cardiovascular complication will be avoided Outcome: Progressing   Problem: Activity: Goal: Risk for activity intolerance will decrease Outcome: Progressing   Problem: Nutrition: Goal: Adequate nutrition will be maintained Outcome: Progressing   Problem: Elimination: Goal: Will not experience complications related to bowel motility Outcome: Progressing Goal: Will not experience complications related to urinary retention Outcome: Progressing   Problem: Pain Managment: Goal: General experience of comfort will improve Outcome: Progressing   Problem: Safety: Goal: Ability to remain free from injury will improve Outcome: Progressing   Problem: Skin Integrity: Goal: Risk for impaired skin integrity will decrease Outcome: Progressing

## 2019-08-06 NOTE — Plan of Care (Signed)
  Problem: Clinical Measurements: Goal: Will remain free from infection Outcome: Progressing Goal: Diagnostic test results will improve Outcome: Progressing Goal: Respiratory complications will improve Outcome: Progressing Goal: Cardiovascular complication will be avoided Outcome: Progressing   Problem: Activity: Goal: Risk for activity intolerance will decrease Outcome: Progressing   Problem: Nutrition: Goal: Adequate nutrition will be maintained Outcome: Progressing   Problem: Elimination: Goal: Will not experience complications related to bowel motility Outcome: Progressing Goal: Will not experience complications related to urinary retention Outcome: Progressing   Problem: Pain Managment: Goal: General experience of comfort will improve Outcome: Progressing   Problem: Safety: Goal: Ability to remain free from injury will improve Outcome: Progressing   Problem: Skin Integrity: Goal: Risk for impaired skin integrity will decrease Outcome: Progressing   Problem: Clinical Measurements: Goal: Postoperative complications will be avoided or minimized Outcome: Progressing   Problem: Skin Integrity: Goal: Demonstration of wound healing without infection will improve Outcome: Progressing

## 2019-08-06 NOTE — Progress Notes (Signed)
Received call from radiology, Pneumothorax is approximately 60% larger on 08/06/2019 CXR than prior.  I phoned [place] and advised him.  Myron ordered to unclamp the chest tube and leave to water seal.  I have unclamped the tube and left to water seal as verbal ordered by [employer].

## 2019-08-06 NOTE — Progress Notes (Addendum)
      [address]       [place] [address]             [phone]      11 Days Post-Op Procedure(s) (LRB): VIDEO BRONCHOSCOPY WITH ENDOBRONCHIAL NAVIGATION (N/A) XI ROBOTIC ASSISTED THORASCOPY-RIGHT MIDDLE LOBE WEDGE RESECTION & SUPERIOR SEGMENTECTOMY (Right) Node Dissection (Right) Intercostal Nerve Block (Right)   Subjective:  Sitting up on side of bed eating breakfast.  He has no specific complaints.  States he is doing okay.  Objective: Vital signs in last 24 hours: Temp:  [98.2 F (36.8 C)-99.1 F (37.3 C)] 98.2 F (36.8 C) (03/08 0402) Pulse Rate:  [80-88] 85 (03/08 0711) Cardiac Rhythm: Normal sinus rhythm (03/08 0445) Resp:  [16-23] 22 (03/08 0711) BP: (102-132)/(63-84) 121/66 (03/08 0402) SpO2:  [98 %-100 %] 99 % (03/08 0711)  Intake/Output from previous day: 03/07 0701 - 03/08 0700 In: 960 [P.O.:960] Out: 1200 [Urine:1050; Chest Tube:150]  General appearance: alert, cooperative and no distress Heart: regular rate and rhythm Lungs: clear to auscultation bilaterally Abdomen: soft, non-tender; bowel sounds normal; no masses,  no organomegaly Extremities: extremities normal, atraumatic, no cyanosis or edema Wound: clean and dry  Lab Results: Recent Labs    08/05/19 0217  WBC 12.1*  HGB 8.9*  HCT 26.6*  PLT 406*   BMET: No results for input(s): NA, K, CL, CO2, GLUCOSE, BUN, CREATININE, CALCIUM in the last 72 hours.  PT/INR: No results for input(s): LABPROT, INR in the last 72 hours. ABG    Component Value Date/Time   PHART 7.418 07/27/2019 0502   HCO3 24.3 07/27/2019 0502   TCO2 30 07/26/2019 1251   ACIDBASEDEF 3.0 (H) 07/26/2019 1201   O2SAT 96.2 07/27/2019 0502   CBG (last 3)  No results for input(s): GLUCAP in the last 72 hours.  Assessment/Plan: S/P Procedure(s) (LRB): VIDEO BRONCHOSCOPY WITH ENDOBRONCHIAL NAVIGATION (N/A) XI ROBOTIC ASSISTED THORASCOPY-RIGHT MIDDLE LOBE WEDGE RESECTION & SUPERIOR SEGMENTECTOMY  (Right) Node Dissection (Right) Intercostal Nerve Block (Right)  1. CV- hemodynamically stable on Lopressor, Norvasc 2. Pulm- CT to Mini Epxress,  No air leak appreciated, will review CXR once complete if stable, will clamp chest tube, repeat CXR and possibly remove later today 3. Lovenox for DVT prophylaxis 4. Dispo- patient stable, CT remains hooked to Mini Express, will review CXR once completed, if stable will clamp tube with possible removal later today   LOS: 11 days    Ellwood Handler, PA-C  08/06/2019  Agree with above Ct trial with increase in pneumothorax, will place back to suction.  Birgitta Uhlir Bary Leriche

## 2019-08-06 NOTE — Progress Notes (Signed)
Physical Therapy Treatment Patient Details Name: Larry [name] MRN: [id number] DOB: June 30, 1950 Today's Date: 08/06/2019    History of Present Illness Pt is a 69 y/o male S/p RML wedge resection, and superior segmentectomy of RLL, lymph node dissection, and intercostal nerve block secondary to nodules found on R middle lobe and R lower lobe nodules. Chest tube clamped this AM 3/8. PMH includes COPD, lung cancer, prostate cancer, and tobacco use.    PT Comments    Patient progressing well towards PT goals. Pt had chest tube clamped this AM and reports worsening fatigue and SOB with ambulation today. Tolerated same distance as prior sessions however required 3 standing rest breaks today. Sp02 dropped to 84% at end of session with 2-3/4 DOE on 2L/min 02 [place]. RR up to 33. Pt doing better at locking brakes on rollator for standing rest breaks. Rn notified of difficulty breathing since clamping of chest tube. Will continue to follow.    Follow Up Recommendations  Home health PT;Supervision for mobility/OOB     Equipment Recommendations  Other (comment)(rollator (4 wheeled walker))    Recommendations for Other Services       Precautions / Restrictions Precautions Precautions: Fall;Other (comment) Precaution Comments: watch O2; chest tube  Restrictions Weight Bearing Restrictions: No    Mobility  Bed Mobility               General bed mobility comments: Standing at sink upon PT arrival.  Transfers Overall transfer level: Needs assistance Equipment used: 4-wheeled walker Transfers: Sit to/from Stand Sit to Stand: Min guard         General transfer comment: Min guard for safety and to manage lines.  Ambulation/Gait Ambulation/Gait assistance: Supervision;Min guard Gait Distance (Feet): 400 Feet Assistive device: 4-wheeled walker Gait Pattern/deviations: Step-through pattern;Trunk flexed;Decreased stride length Gait velocity: decreased   General Gait Details: Steady gait  with 3 standing rest breaks due to 2-3/4 DOE. Sp02 remained >90% on 2Lmin 02 [place] except once returned to room and dropped to 84%. Cues for pursed lip breathing.   Stairs             Wheelchair Mobility    Modified Rankin (Stroke Patients Only)       Balance Overall balance assessment: Needs assistance Sitting-balance support: Feet supported;No upper extremity supported Sitting balance-Leahy Scale: Good     Standing balance support: During functional activity Standing balance-Leahy Scale: Fair Standing balance comment: min guard to supervision for safety                             Cognition Arousal/Alertness: Awake/alert Behavior During Therapy: WFL for tasks assessed/performed Overall Cognitive Status: Within Functional Limits for tasks assessed                                 General Comments: Repetition at times due to [facility].      Exercises      General Comments General comments (skin integrity, edema, etc.): Sp02 ranged from 84-95% on 2L/min 02 [place] with activity. RR up to 33.      Pertinent Vitals/Pain Pain Assessment: No/denies pain    Home Living                      Prior Function            PT Goals (current goals can now be found  in the care plan section) Progress towards PT goals: Progressing toward goals    Frequency    Min 3X/week      PT Plan Current plan remains appropriate    Co-evaluation              AM-PAC PT "6 Clicks" Mobility   Outcome Measure  Help needed turning from your back to your side while in a flat bed without using bedrails?: None Help needed moving from lying on your back to sitting on the side of a flat bed without using bedrails?: None Help needed moving to and from a bed to a chair (including a wheelchair)?: None Help needed standing up from a chair using your arms (e.g., wheelchair or bedside chair)?: None Help needed to walk in hospital room?: A Little Help needed  climbing 3-5 steps with a railing? : A Little 6 Click Score: 22    End of Session Equipment Utilized During Treatment: Oxygen Activity Tolerance: Treatment limited secondary to medical complications (Comment)(drop in Sp02) Patient left: in bed;with call bell/phone within reach(sitting EOB) Nurse Communication: Mobility status PT Visit Diagnosis: Difficulty in walking, not elsewhere classified (R26.2)     Time: 1032-1050 PT Time Calculation (min) (ACUTE ONLY): 18 min  Charges:  $Therapeutic Exercise: 8-22 mins                     Marisa Severin, PT, DPT Acute Rehabilitation Services Pager [phone] Office [phone]       Marguarite Arbour A Sabra Heck 08/06/2019, 10:54 AM

## 2019-08-06 NOTE — Progress Notes (Addendum)
Occupational Therapy Treatment Patient Details Name: Larry [name] MRN: [id number] DOB: Apr 05, 1951 Today's Date: 08/06/2019    History of present illness Pt is a 69 y/o male S/p RML wedge resection, and superior segmentectomy of RLL, lymph node dissection, and intercostal nerve block secondary to nodules found on R middle lobe and R lower lobe nodules. Chest tube clamped this AM 3/8. PMH includes COPD, lung cancer, prostate cancer, and tobacco use.   OT comments  Pt progressing well with OOB ADL. Pt stating 1 energy conservation strategy and allowed for minimal education before pt wanting to ambulate with PT in hallway. Pt stood at sink for UB ADL with supervisionA. Pt supervisionA for mobility in room with rollator; minguardA for stability with lines. Pt would benefit from continued OT skilled services for ADL and energy conservation. OT following acutely.  Pt's O2 ranging from 90-93% on 2L O2 [place] standing at sink and ambulating in room.     Follow Up Recommendations  Home health OT;Supervision - Intermittent    Equipment Recommendations  None recommended by OT    Recommendations for Other Services      Precautions / Restrictions Precautions Precautions: Fall;Other (comment) Precaution Comments: watch O2; chest tube  Restrictions Weight Bearing Restrictions: No       Mobility Bed Mobility               General bed mobility comments: sitting EOB upon OT arrival  Transfers Overall transfer level: Needs assistance Equipment used: 4-wheeled walker Transfers: Sit to/from Stand Sit to Stand: Min guard         General transfer comment: Min guard for safety and to manage lines.    Balance Overall balance assessment: Needs assistance Sitting-balance support: Feet supported;No upper extremity supported Sitting balance-Leahy Scale: Good     Standing balance support: During functional activity Standing balance-Leahy Scale: Fair Standing balance comment: supervisionA  to minguardA for safety                           ADL either performed or assessed with clinical judgement   ADL Overall ADL's : Needs assistance/impaired     Grooming: Supervision/safety;Standing               Lower Body Dressing: Min guard;Cueing for safety;Sitting/lateral leans;Sit to/from stand   Toilet Transfer: Supervision/safety;Ambulation;BSC           Functional mobility during ADLs: Supervision/safety;Min guard;Cueing for safety;Rolling walker General ADL Comments: Pt limited by decreased activity tolerance.     Vision       Perception     Praxis      Cognition Arousal/Alertness: Awake/alert Behavior During Therapy: WFL for tasks assessed/performed Overall Cognitive Status: Within Functional Limits for tasks assessed                                 General Comments: Repetition at times due to [facility].        Exercises     Shoulder Instructions       General Comments Pt's O2 ranging from 90-93% on 2L O2 [place] standing at sink and ambulating in room.    Pertinent Vitals/ Pain       Pain Assessment: No/denies pain  Home Living  Prior Functioning/Environment              Frequency  Min 2X/week        Progress Toward Goals  OT Goals(current goals can now be found in the care plan section)  Progress towards OT goals: Progressing toward goals  Acute Rehab OT Goals Patient Stated Goal: to go home OT Goal Formulation: With patient Time For Goal Achievement: 08/16/19 Potential to Achieve Goals: Good ADL Goals Pt Will Perform Grooming: with modified independence;standing Pt Will Perform Lower Body Dressing: with modified independence;sit to/from stand;with adaptive equipment Pt Will Transfer to Toilet: with modified independence;ambulating;bedside commode Pt Will Perform Toileting - Clothing Manipulation and hygiene: with modified independence;sit to/from  stand Pt Will Perform Tub/Shower Transfer: Tub transfer;with modified independence;ambulating;shower seat Additional ADL Goal #1: Pt will utilize 3 energy conservation techniques during ADL routine with independence.  Plan Discharge plan remains appropriate    Co-evaluation                 AM-PAC OT "6 Clicks" Daily Activity     Outcome Measure   Help from another person eating meals?: None Help from another person taking care of personal grooming?: None Help from another person toileting, which includes using toliet, bedpan, or urinal?: A Little Help from another person bathing (including washing, rinsing, drying)?: A Little Help from another person to put on and taking off regular upper body clothing?: None Help from another person to put on and taking off regular lower body clothing?: A Little 6 Click Score: 21    End of Session Equipment Utilized During Treatment: Rolling walker;Oxygen(rollator)  OT Visit Diagnosis: Muscle weakness (generalized) (M62.81);Unsteadiness on feet (R26.81);Pain   Activity Tolerance Patient tolerated treatment well   Patient Left Other (comment)(up with PT in room)   Nurse Communication Mobility status        Time: 5400-8676 OT Time Calculation (min): 21 min  Charges: OT General Charges $OT Visit: 1 Visit OT Treatments $Self Care/Home Management : 8-22 mins    Jefferey Pica, OTR/L Acute Rehabilitation Services Pager: [phone] Office: [phone]    Asako Saliba C 08/06/2019, 4:00 PM

## 2019-08-06 NOTE — Plan of Care (Signed)
  Problem: Clinical Measurements: Goal: Will remain free from infection 08/06/2019 0713 by Shanon Ace, RN Outcome: Progressing 08/06/2019 0713 by Shanon Ace, RN Outcome: Progressing Goal: Diagnostic test results will improve 08/06/2019 0713 by Shanon Ace, RN Outcome: Progressing 08/06/2019 0713 by Shanon Ace, RN Outcome: Progressing Goal: Respiratory complications will improve 08/06/2019 0713 by Shanon Ace, RN Outcome: Progressing 08/06/2019 0713 by Shanon Ace, RN Outcome: Progressing Goal: Cardiovascular complication will be avoided 08/06/2019 0713 by Shanon Ace, RN Outcome: Progressing 08/06/2019 0713 by Shanon Ace, RN Outcome: Progressing   Problem: Activity: Goal: Risk for activity intolerance will decrease 08/06/2019 0713 by Shanon Ace, RN Outcome: Progressing 08/06/2019 0713 by Shanon Ace, RN Outcome: Progressing   Problem: Nutrition: Goal: Adequate nutrition will be maintained 08/06/2019 0713 by Shanon Ace, RN Outcome: Progressing 08/06/2019 0713 by Shanon Ace, RN Outcome: Progressing   Problem: Elimination: Goal: Will not experience complications related to bowel motility 08/06/2019 0713 by Shanon Ace, RN Outcome: Progressing 08/06/2019 0713 by Shanon Ace, RN Outcome: Progressing Goal: Will not experience complications related to urinary retention 08/06/2019 0713 by Shanon Ace, RN Outcome: Progressing 08/06/2019 0713 by Shanon Ace, RN Outcome: Progressing   Problem: Pain Managment: Goal: General experience of comfort will improve 08/06/2019 0713 by Shanon Ace, RN Outcome: Progressing 08/06/2019 0713 by Shanon Ace, RN Outcome: Progressing   Problem: Safety: Goal: Ability to remain free from injury will improve 08/06/2019 0713 by Shanon Ace, RN Outcome: Progressing 08/06/2019 0713 by Shanon Ace, RN Outcome: Progressing   Problem: Skin Integrity: Goal: Risk for impaired skin integrity will decrease 08/06/2019 0713 by Shanon Ace, RN Outcome: Progressing 08/06/2019 0713 by Shanon Ace, RN Outcome: Progressing

## 2019-08-06 NOTE — Progress Notes (Signed)
[name] developed a 60% right pneumothorax after a 4-hour chest tube clamp trial this morning. The tube was unclamped as soon as the CXR resulted and the patient was assessed. He does not appear dyspneic on RA but noted more shortness of breath with ambulation earlier today. The chest tube is secure and functioning appropriately.  Will place back to -20cmH2O suction with a standard PleurEvac in place of the Mini Express.  CXR in AM.   Macarthur Critchley, PA-C [phone]

## 2019-08-07 ENCOUNTER — Inpatient Hospital Stay (HOSPITAL_COMMUNITY): Payer: Medicare Other

## 2019-08-07 MED ORDER — POLYETHYLENE GLYCOL 3350 17 G PO PACK
17.0000 g | PACK | Freq: Once | ORAL | Status: AC
  Administered 2019-08-07: 17 g via ORAL
  Filled 2019-08-07: qty 1

## 2019-08-07 NOTE — Progress Notes (Addendum)
      [address]       [place] [address]             [phone]       12 Days Post-Op Procedure(s) (LRB): VIDEO BRONCHOSCOPY WITH ENDOBRONCHIAL NAVIGATION (N/A) XI ROBOTIC ASSISTED THORASCOPY-RIGHT MIDDLE LOBE WEDGE RESECTION & SUPERIOR SEGMENTECTOMY (Right) Node Dissection (Right) Intercostal Nerve Block (Right)  Subjective: Patient states he went for a walk after chest tube was clamped and got real short of breath (CXR showed right pneumothorax so chest tube placed back to suction). He states his breathing is better this am and he has complaints of constipation.  Objective: Vital signs in last 24 hours: Temp:  [97.8 F (36.6 C)-98.5 F (36.9 C)] 97.8 F (36.6 C) (03/09 0300) Pulse Rate:  [78-93] 80 (03/09 0320) Cardiac Rhythm: Normal sinus rhythm (03/09 0450) Resp:  [14-24] 16 (03/09 0320) BP: (100-126)/(65-76) 100/65 (03/09 0300) SpO2:  [94 %-100 %] 100 % (03/09 0320)     Intake/Output from previous day: 03/08 0701 - 03/09 0700 In: 423 [P.O.:420; I.V.:3] Out: 2140 [Urine:1800; Chest Tube:340]   Physical Exam:  Cardiovascular: RRR Pulmonary: Rhonchi  Abdomen: Soft, non tender, bowel sounds present. Extremities: No lower extremity edema. Wounds: Clean and dry.  No erythema or signs of infection. Chest Tube: to suction, +1 air leak with cough  Lab Results: CBC: Recent Labs    08/05/19 0217  WBC 12.1*  HGB 8.9*  HCT 26.6*  PLT 406*   BMET: No results for input(s): NA, K, CL, CO2, GLUCOSE, BUN, CREATININE, CALCIUM in the last 72 hours.  PT/INR: No results for input(s): LABPROT, INR in the last 72 hours. ABG:  INR: Will add last result for INR, ABG once components are confirmed Will add last 4 CBG results once components are confirmed  Assessment/Plan:  1. CV - SR with HR in the 80's. On Toprol XL 25 mg daily and Amlodipine 10 mg daily. 2.  Pulmonary - History of COPD. On 1-2 liters of oxygen via [place]. Wean as able. Chest tube with  340 cc of output last 24 hours. Chest tube is to suction and there is a +1 intermittent air leak with cough. CXR this am appears to show near resolution of right pneumothorax, seen on yesterday's film. As discussed with Dr. Roxan Hockey, place chest tube to mini express. Patient now agreeable to go home with mini express. Check CXR in am Continue Dulera. Encourage incentive spirometer.  3. Miralax for constipation 4. Lovenox for DVT prophylaxis 5. Possible discharge in am if CXR remains stable, with chest tube to mini express Donielle M ZimmermanPA-C 08/07/2019,7:03 AM [phone]  Patient seen and examined, agree with above He has a small intermittent air leak. I don't think the leak is large enough to justify IBV or redo VATS. Will place to miniexpress and plan to dc home with CT tomorrow if CXR ok  Remo Lipps C. Roxan Hockey, MD [facility] [phone]

## 2019-08-07 NOTE — Plan of Care (Signed)
  Problem: Clinical Measurements: Goal: Will remain free from infection Outcome: Progressing Goal: Diagnostic test results will improve Outcome: Progressing Goal: Respiratory complications will improve Outcome: Progressing Goal: Cardiovascular complication will be avoided Outcome: Progressing   Problem: Activity: Goal: Risk for activity intolerance will decrease Outcome: Progressing   Problem: Nutrition: Goal: Adequate nutrition will be maintained Outcome: Progressing   Problem: Elimination: Goal: Will not experience complications related to bowel motility Outcome: Progressing Goal: Will not experience complications related to urinary retention Outcome: Progressing   Problem: Pain Managment: Goal: General experience of comfort will improve Outcome: Progressing   Problem: Safety: Goal: Ability to remain free from injury will improve Outcome: Progressing   Problem: Skin Integrity: Goal: Risk for impaired skin integrity will decrease Outcome: Progressing   Problem: Clinical Measurements: Goal: Postoperative complications will be avoided or minimized Outcome: Progressing   Problem: Skin Integrity: Goal: Demonstration of wound healing without infection will improve Outcome: Progressing

## 2019-08-07 NOTE — Progress Notes (Addendum)
Changed Pt from sarah to Larry [name] express at 0850 AM today, and he tolerated procedure well no s/s of distress or discomfort or desaturation.   Since then, pt has gotten out of bed and sitting in chair, and has been weaned of of his oxygen and is on room air.

## 2019-08-08 ENCOUNTER — Inpatient Hospital Stay (HOSPITAL_COMMUNITY): Payer: Medicare Other

## 2019-08-08 LAB — BPAM RBC
Blood Product Expiration Date: 202103282359
Blood Product Expiration Date: 202103282359
Unit Type and Rh: 5100
Unit Type and Rh: 5100

## 2019-08-08 LAB — TYPE AND SCREEN
ABO/RH(D): O POS
Antibody Screen: NEGATIVE
Unit division: 0
Unit division: 0

## 2019-08-08 MED ORDER — ACETAMINOPHEN 325 MG PO TABS: 650.0000 mg | ORAL_TABLET | ORAL | 2 refills | Status: DC | PRN

## 2019-08-08 MED ORDER — METOPROLOL SUCCINATE ER 25 MG PO TB24: 25.0000 mg | ORAL_TABLET | Freq: Every day | ORAL | 3 refills | Status: DC

## 2019-08-08 MED ORDER — TRAMADOL HCL 50 MG PO TABS: 50.0000 mg | ORAL_TABLET | Freq: Four times a day (QID) | ORAL | 0 refills | Status: DC | PRN

## 2019-08-08 NOTE — Plan of Care (Signed)
  Problem: Clinical Measurements: Goal: Will remain free from infection Outcome: Progressing Goal: Diagnostic test results will improve Outcome: Progressing Goal: Respiratory complications will improve Outcome: Progressing Goal: Cardiovascular complication will be avoided Outcome: Progressing   Problem: Activity: Goal: Risk for activity intolerance will decrease Outcome: Progressing   Problem: Nutrition: Goal: Adequate nutrition will be maintained Outcome: Progressing   Problem: Elimination: Goal: Will not experience complications related to bowel motility Outcome: Progressing Goal: Will not experience complications related to urinary retention Outcome: Progressing   Problem: Pain Managment: Goal: General experience of comfort will improve Outcome: Progressing   Problem: Safety: Goal: Ability to remain free from injury will improve Outcome: Progressing   Problem: Skin Integrity: Goal: Risk for impaired skin integrity will decrease Outcome: Progressing   Problem: Clinical Measurements: Goal: Postoperative complications will be avoided or minimized Outcome: Progressing   Problem: Skin Integrity: Goal: Demonstration of wound healing without infection will improve Outcome: Progressing

## 2019-08-08 NOTE — Progress Notes (Addendum)
      [address]       [place] [address]             [phone]      13 Days Post-Op Procedure(s) (LRB): VIDEO BRONCHOSCOPY WITH ENDOBRONCHIAL NAVIGATION (N/A) XI ROBOTIC ASSISTED THORASCOPY-RIGHT MIDDLE LOBE WEDGE RESECTION & SUPERIOR SEGMENTECTOMY (Right) Node Dissection (Right) Intercostal Nerve Block (Right)   Subjective:  No new complaints.  States he misses his home and is happy to be going home.  He does state however his sister has his keys and she is a Tour manager and he hasn't been able to get ahold of her today.  Objective: Vital signs in last 24 hours: Temp:  [98 F (36.7 C)-99.7 F (37.6 C)] 98.1 F (36.7 C) (03/10 0326) Pulse Rate:  [85-91] 86 (03/10 0326) Cardiac Rhythm: Normal sinus rhythm (03/10 0326) Resp:  [17-22] 17 (03/10 0326) BP: (108-130)/(68-79) 120/75 (03/10 0326) SpO2:  [90 %-98 %] 92 % (03/10 0326)  Intake/Output from previous day: 03/09 0701 - 03/10 0700 In: 483 [P.O.:480; I.V.:3] Out: 910 [Urine:850; Chest Tube:60]  General appearance: alert, cooperative and no distress Heart: regular rate and rhythm Lungs: clear to auscultation bilaterally Abdomen: soft, non-tender; bowel sounds normal; no masses,  no organomegaly Extremities: extremities normal, atraumatic, no cyanosis or edema Wound: clean and dry  Lab Results: No results for input(s): WBC, HGB, HCT, PLT in the last 72 hours. BMET: No results for input(s): NA, K, CL, CO2, GLUCOSE, BUN, CREATININE, CALCIUM in the last 72 hours.  PT/INR: No results for input(s): LABPROT, INR in the last 72 hours. ABG    Component Value Date/Time   PHART 7.418 07/27/2019 0502   HCO3 24.3 07/27/2019 0502   TCO2 30 07/26/2019 1251   ACIDBASEDEF 3.0 (H) 07/26/2019 1201   O2SAT 96.2 07/27/2019 0502   CBG (last 3)  No results for input(s): GLUCAP in the last 72 hours.  Assessment/Plan: S/P Procedure(s) (LRB): VIDEO BRONCHOSCOPY WITH ENDOBRONCHIAL NAVIGATION (N/A) XI ROBOTIC  ASSISTED THORASCOPY-RIGHT MIDDLE LOBE WEDGE RESECTION & SUPERIOR SEGMENTECTOMY (Right) Node Dissection (Right) Intercostal Nerve Block (Right)  1. CV- NSR, BP stable- continue Lopressor, Norvasc 2. Pulm- no acute issues, CXR shows trace apical pneumothorax, CT on Mini Express, tiny air leak.. will leave chest tube in place 3. Lovenox for DVT prophylaxis 4. Dispo- patient stable, CXR with minimal right apical pneumothorax, will leave chest tube in place on mini express, home health arrangements have been made, will d/c home today.. will have nursing assist patient in getting ahold of sister.    LOS: 13 days    Ellwood Handler, PA-C  08/08/2019 Patient seen and examined. Still has a small intermittent air leak, home with tube, will remove in office when leak stops  Remo Lipps C. Roxan Hockey, MD [facility] [phone]

## 2019-08-08 NOTE — Progress Notes (Signed)
Occupational Therapy Treatment + Discharge Patient Details Name: Larry [name] MRN: [id number] DOB: 04/28/1951 Today's Date: 08/08/2019    History of present illness Pt is a 69 y/o male S/p RML wedge resection, and superior segmentectomy of RLL, lymph node dissection, and intercostal nerve block secondary to nodules found on R middle lobe and R lower lobe nodules. Pt with chest tube on mini express. PMH includes COPD, lung cancer, prostate cancer, and tobacco use.   OT comments  Pt progressing with ADL to supervisionA level when standing. Pt education for energy conservation completed. Pt able to state 3 ways to conserve energy following education. Pt with good carry over skills. O2 sats  >90% on RA with exertion today. Pt does not require continued OT skilled services as pt is at highest functional potential in acute setting. Pt met goals and OT d/c today.    Follow Up Recommendations  Home health OT    Equipment Recommendations  None recommended by OT    Recommendations for Other Services      Precautions / Restrictions Precautions Precautions: Fall Precaution Comments: watch O2; chest tube  Restrictions Weight Bearing Restrictions: No       Mobility Bed Mobility               General bed mobility comments: upon arrival pt ambulating out of restroom independently  Transfers Overall transfer level: Needs assistance Equipment used: 4-wheeled walker Transfers: Sit to/from Stand Sit to Stand: Supervision         General transfer comment: Pt able to manage chest tube line independently;  assist with O2 and BP lines    Balance Overall balance assessment: Needs assistance Sitting-balance support: No upper extremity supported;Feet supported Sitting balance-Leahy Scale: Normal     Standing balance support: During functional activity;No upper extremity supported Standing balance-Leahy Scale: Fair Standing balance comment: Upon arrival pt ambulating out of bathroom  without AD and had LOB with turning and when bending over to hook  chest tube on bed rail.  Educated pt to use rollator at all times and to have UE support if bending over or sit down then hang chest tube on bed rail.  No LOB with Rollator               High Level Balance Comments: Performed balance activities with close guard and no AD:  turning in circle x 2 both directions; side stepping/walking backwards/forwards around 4' square x 3; heel raises x10; mini squats x 10; standing marching x 10           ADL either performed or assessed with clinical judgement   ADL Overall ADL's : Needs assistance/impaired                                     Functional mobility during ADLs: Supervision/safety(rollator) General ADL Comments: Education for energy conservation completed. Pt able to state 3 ways to conserve energy following education. Pt with good carry over skills. Pt able to perform ADL tasks at [place] level.     Vision       Perception     Praxis      Cognition Arousal/Alertness: Awake/alert Behavior During Therapy: WFL for tasks assessed/performed Overall Cognitive Status: Within Functional Limits for tasks assessed  Exercises     Shoulder Instructions       General Comments Pt educated to use rollator at all times, only not using when working with PT on balance    Pertinent Vitals/ Pain       Pain Assessment: No/denies pain  Home Living                                          Prior Functioning/Environment              Frequency           Progress Toward Goals  OT Goals(current goals can now be found in the care plan section)  Progress towards OT goals: Goals met/education completed, patient discharged from OT  Acute Rehab OT Goals Patient Stated Goal: to go home OT Goal Formulation: With patient Time For Goal Achievement: 08/16/19 Potential to  Achieve Goals: Good ADL Goals Pt Will Perform Grooming: with modified independence;standing Pt Will Perform Lower Body Dressing: with modified independence;sit to/from stand;with adaptive equipment Pt Will Transfer to Toilet: with modified independence;ambulating;bedside commode Pt Will Perform Toileting - Clothing Manipulation and hygiene: with modified independence;sit to/from stand Pt Will Perform Tub/Shower Transfer: Tub transfer;with modified independence;ambulating;shower seat Additional ADL Goal #1: Pt will utilize 3 energy conservation techniques during ADL routine with independence.  Plan Discharge plan remains appropriate;All goals met and education completed, patient discharged from OT services    Co-evaluation                 AM-PAC OT "6 Clicks" Daily Activity     Outcome Measure   Help from another person eating meals?: None Help from another person taking care of personal grooming?: None Help from another person toileting, which includes using toliet, bedpan, or urinal?: None Help from another person bathing (including washing, rinsing, drying)?: A Little Help from another person to put on and taking off regular upper body clothing?: None Help from another person to put on and taking off regular lower body clothing?: None 6 Click Score: 23    End of Session Equipment Utilized During Treatment: Other (comment)  OT Visit Diagnosis: Muscle weakness (generalized) (M62.81);Unsteadiness on feet (R26.81);Pain   Activity Tolerance (rollator)   Patient Left in chair;with call bell/phone within reach   Nurse Communication Mobility status        Time: 5427-0623 OT Time Calculation (min): 12 min  Charges: OT General Charges $OT Visit: 1 Visit OT Treatments $Self Care/Home Management : 8-22 mins  Jefferey Pica, OTR/L Acute Rehabilitation Services Pager: [phone] Office: [phone]    Abbie Berling C 08/08/2019, 12:47 PM

## 2019-08-08 NOTE — TOC Progression Note (Addendum)
Transition of Care [facility]) - Progression Note    Patient Details  Name: Larry [name] MRN: [id number] Date of Birth: [birth year]  Transition of Care [facility]) CM/SW Contact  Zenon Mayo, RN Phone Number: 08/08/2019, 9:19 AM  Clinical Narrative:    Patient is for dc today, he has rollator in the room, sats were checked again today and looks like he does not need oxygen.  NCM checked with Manuela Schwartz Staff RN, she states he was off oxygen on yesterday,so he does not need it.  NCM informed her will call Zach with [employer] to come pick up the oxygen from his room.  NCM informed Manuela Schwartz to give patient syringe to go home with for the mini express chest tube and to educate family member on how to drain.  NCM informed T[place] with [facility] patient is for dc today.       Barriers to Discharge: Continued Medical Work up  Expected Discharge Plan and Services           Expected Discharge Date: 08/08/19               DME Arranged: Oxygen, Walker rolling with seat DME Agency: [employer] Date DME Agency Contacted: 08/01/19 Time DME Agency Contacted: 1657 Representative spoke with at DME Agency: [place]: PT [place]: [facility] [employer] Date [place]: 08/01/19 Time [place]: [place] Representative spoke with at [place]: [place] ([place]) Interventions    Readmission Risk Interventions No flowsheet data found.

## 2019-08-08 NOTE — Progress Notes (Addendum)
SATURATION QUALIFICATIONS: (This note is used to comply with regulatory documentation for home oxygen)  Patient Saturations on Room Air at Rest = 98%  Patient Saturations on Room Air while Ambulating = 92% Pt ambulated 464ft with aid of 4 wheel rolling walker, tolerated well on room air. Pt positioned in chair after walk.   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

## 2019-08-08 NOTE — Progress Notes (Signed)
Physical Therapy Treatment Patient Details Name: Larry [name] MRN: [id number] DOB: [birth year] Today's Date: 08/08/2019    History of Present Illness Pt is a 69 y/o male S/p RML wedge resection, and superior segmentectomy of RLL, lymph node dissection, and intercostal nerve block secondary to nodules found on R middle lobe and R lower lobe nodules. Pt with chest tube on mini express. PMH includes COPD, lung cancer, prostate cancer, and tobacco use.    PT Comments    Pt able to ambulate 400' with rollator on RA with stable O2 sats.  Needing min cues for safety and posture.  Pt was able to manage chest tube without assist but min A for telemetry lines.  Pt educated on use of rollator at all times, as he was unsteady without.   Follow Up Recommendations  Home health PT;Supervision for mobility/OOB     Equipment Recommendations  Other (comment)(rollator)    Recommendations for Other Services       Precautions / Restrictions Precautions Precautions: Fall Precaution Comments: watch O2; chest tube     Mobility  Bed Mobility               General bed mobility comments: upon arrival pt ambulating out of restroom independently  Transfers Overall transfer level: Needs assistance Equipment used: 4-wheeled walker Transfers: Sit to/from Stand Sit to Stand: Supervision         General transfer comment: Pt able to manage chest tube line independently;  assist with O2 and BP lines  Ambulation/Gait Ambulation/Gait assistance: Supervision;Min guard Gait Distance (Feet): 400 Feet Assistive device: 4-wheeled walker Gait Pattern/deviations: Step-through pattern Gait velocity: decreased   General Gait Details: No rest breaks needed, cued for posture, Pt was on RA with sats down to 95% with ambulation and DOE 2-3/4.   Stairs             Wheelchair Mobility    Modified Rankin (Stroke Patients Only)       Balance Overall balance assessment: Needs  assistance Sitting-balance support: No upper extremity supported;Feet supported Sitting balance-Leahy Scale: Normal     Standing balance support: During functional activity;No upper extremity supported Standing balance-Leahy Scale: Fair Standing balance comment: Upon arrival pt ambulating out of bathroom without AD and had LOB with turning and when bending over to hook  chest tube on bed rail.  Educated pt to use rollator at all times and to have UE support if bending over or sit down then hang chest tube on bed rail.  No LOB with Rollator               High Level Balance Comments: Performed balance activities with close guard and no AD:  turning in circle x 2 both directions; side stepping/walking backwards/forwards around 4' square x 3; heel raises x10; mini squats x 10; standing marching x 10            Cognition Arousal/Alertness: Awake/alert Behavior During Therapy: WFL for tasks assessed/performed Overall Cognitive Status: Within Functional Limits for tasks assessed                                        Exercises      General Comments General comments (skin integrity, edema, etc.): Pt educated to use rollator at all times, only not using when working with PT on balance      Pertinent Vitals/Pain Pain Assessment: No/denies pain  Home Living                      Prior Function            PT Goals (current goals can now be found in the care plan section) Progress towards PT goals: Progressing toward goals    Frequency    Min 3X/week      PT Plan Current plan remains appropriate    Co-evaluation              AM-PAC PT "6 Clicks" Mobility   Outcome Measure  Help needed turning from your back to your side while in a flat bed without using bedrails?: None Help needed moving from lying on your back to sitting on the side of a flat bed without using bedrails?: None Help needed moving to and from a bed to a chair (including  a wheelchair)?: None Help needed standing up from a chair using your arms (e.g., wheelchair or bedside chair)?: None Help needed to walk in hospital room?: None Help needed climbing 3-5 steps with a railing? : A Little 6 Click Score: 23    End of Session Equipment Utilized During Treatment: Gait belt Activity Tolerance: Patient tolerated treatment well Patient left: in chair;with call bell/phone within reach Nurse Communication: Mobility status PT Visit Diagnosis: Difficulty in walking, not elsewhere classified (R26.2)     Time: 9355-2174 PT Time Calculation (min) (ACUTE ONLY): 23 min  Charges:  $Gait Training: 8-22 mins $Neuromuscular Re-education: 8-22 mins                     Maggie Font, PT Acute Rehab Services Pager [phone] [facility] [phone] [facility] 08/08/2019, 12:23 PM

## 2019-08-08 NOTE — Progress Notes (Signed)
Patient education completed with patient and sister, sister was able to demonstrate safe emptying of mini express, education provided for emergency care of chest tube and daily dressing change. Patient taken to front of hospital via wheelchair to meet sister/ride home

## 2019-08-13 ENCOUNTER — Other Ambulatory Visit: Payer: Self-pay | Admitting: Thoracic Surgery (Cardiothoracic Vascular Surgery)

## 2019-08-13 DIAGNOSIS — C3492 Malignant neoplasm of unspecified part of left bronchus or lung: Secondary | ICD-10-CM

## 2019-08-14 ENCOUNTER — Other Ambulatory Visit: Payer: Self-pay | Admitting: Thoracic Surgery (Cardiothoracic Vascular Surgery)

## 2019-08-14 ENCOUNTER — Encounter: Payer: Self-pay | Admitting: Thoracic Surgery (Cardiothoracic Vascular Surgery)

## 2019-08-14 ENCOUNTER — Other Ambulatory Visit: Payer: Self-pay

## 2019-08-14 ENCOUNTER — Ambulatory Visit (INDEPENDENT_AMBULATORY_CARE_PROVIDER_SITE_OTHER): Payer: Self-pay | Admitting: Thoracic Surgery (Cardiothoracic Vascular Surgery)

## 2019-08-14 ENCOUNTER — Ambulatory Visit
Admission: RE | Admit: 2019-08-14 | Discharge: 2019-08-14 | Disposition: A | Discharge status: Home / Self Care | Payer: Medicare Other | Source: Ambulatory Visit | Attending: Thoracic Surgery (Cardiothoracic Vascular Surgery) | Admitting: Thoracic Surgery (Cardiothoracic Vascular Surgery)

## 2019-08-14 ENCOUNTER — Other Ambulatory Visit: Payer: Self-pay | Admitting: *Deleted

## 2019-08-14 VITALS — Ht 72.0 in

## 2019-08-14 DIAGNOSIS — Z902 Acquired absence of lung [part of]: Secondary | ICD-10-CM

## 2019-08-14 DIAGNOSIS — Z4682 Encounter for fitting and adjustment of non-vascular catheter: Secondary | ICD-10-CM

## 2019-08-14 DIAGNOSIS — C3492 Malignant neoplasm of unspecified part of left bronchus or lung: Secondary | ICD-10-CM

## 2019-08-14 NOTE — Progress Notes (Addendum)
[address]       [place] [address]             [phone]     HPI: 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  In 2016 he had neoadjuvant therapy followed by left upper lobectomy for a stage IIb squamous cell carcinoma.  In July 2020 he was found to have 2 small nodules in the right lung.  On follow-up CT one of the nodules had increased in size.  On PET CT they were mildly hypermetabolic.  I did a robotic right VATS for wedge of the right middle lobe and a right lower lobe superior segmentectomy on 07/26/2019.  The middle lobe nodule turned out to be an adenocarcinoma and the superior segmental nodule turned out to be a squamous cell carcinoma.  All the lymph nodes were negative.  He had an air leak postoperatively and ultimately went home on 08/08/2019.  Since discharge he has been doing well.  He never got a prescription for narcotics.  He is only been taking Tylenol occasionally.  He has had a mild cough productive of whitish sputum.  Past Medical History:  Diagnosis Date  . 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 Cataract   . Chronic cough   . COPD (chronic obstructive pulmonary disease) ([place])   . Dyspnea on exertion   . Full dentures   . Hx of adenomatous colonic polyps 06/07/2018  . Hyperlipidemia   . Non-small cell carcinoma of left lung [facility]) oncologist-- dr Julien Nordmann   dx Nov 2015left upper lobe, 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, N1, M0)  s/p  left upper lobectomy with node dissection and neoadjuvant systemic chemotherapy  . Productive cough    intermittantly  . Prostate cancer [facility]) urologist-- dr wrenn/  oncologsit-  dr Tammi Klippel   Stage T1c , Gleason 4+3, PSA 4.1, vol 26.73cc--  External RXT complete and Radiactive seed implants on  04-03-2015  . 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 acetaminophen (TYLENOL) 325 MG tablet Take 2 tablets (650 mg total) by mouth every 4 (four) hours as needed for mild pain. 100 tablet 2  . albuterol (PROVENTIL HFA;VENTOLIN HFA) 108 (90 Base) MCG/ACT inhaler Inhale 1-2 puffs into the lungs every 6 (six) hours as needed for wheezing or shortness of breath.    Marland Kitchen amLODipine (NORVASC) 10 MG tablet TAKE 1 TABLET BY MOUTH EVERY DAY (Patient taking differently: Take 10 mg by mouth daily. ) 90 tablet 1  . aspirin 81 MG chewable tablet Chew 81 mg by mouth daily.     Marland Kitchen buPROPion (WELLBUTRIN SR) 100 MG 12 hr tablet TAKE 1 TABLET BY MOUTH TWICE A DAY (Patient taking differently: Take 100 mg by mouth daily. ) 180 tablet 2  . metoprolol succinate (TOPROL-XL) 25 MG 24 hr tablet Take 1 tablet (25 mg total) by mouth daily. 30 tablet 3  . rosuvastatin (CRESTOR) 40 MG tablet Take 1 tablet (40 mg total) by mouth daily. 30 tablet 5  . SYMBICORT 160-4.5 MCG/ACT inhaler INHALE 2 PUFFS INTO THE LUNGS 2 TIMES DAILY (Patient not taking: Reported on 07/20/2019) 10.2 Inhaler 11  . Tiotropium Bromide-Olodaterol (STIOLTO  RESPIMAT) 2.5-2.5 MCG/ACT AERS Inhale 2 puffs into the lungs daily. 8 g 0  . traMADol (ULTRAM) 50 MG tablet Take 1 tablet (50 mg total) by mouth every 6 (six) hours as needed (mild pain). 30 tablet 0   No current facility-administered medications for this visit.    Physical Exam Ht 6' (1.829 m)   BMI 28.29 kg/m  69 year old man in no acute distress Alert and oriented x3 with no focal deficits Lungs with mild expiratory wheezes bilaterally Cardiac regular rate and rhythm Incision well-healed Some skin breakdown around chest tube site, no air leak  Diagnostic Tests: CHEST - 2 VIEW  COMPARISON:  08/08/2019  FINDINGS: Right chest tube remains in place. No pneumothorax. Postoperative changes in  the right lung with right perihilar opacity, stable. Left lung clear. Heart is normal size. No acute bony abnormality. No visible significant effusions.  IMPRESSION: Postoperative changes on the right with right chest tube. No pneumothorax.   Electronically Signed   By: Rolm Baptise M.D.   On: 08/14/2019 10:06 I personally reviewed the chest x-ray images and concur with the findings noted above.  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  He recently developed 2 new lung nodules in the right lung.  I did a robotic right middle lobe wedge resection and right lower lobe superior segmentectomy 09/2019.  His postoperative course was complicated by prolonged air leak.  Overall he is doing extremely well from a surgical standpoint.  He has minimal discomfort.  His biggest complaint is that he is used to sleeping on his right side and the tube causes discomfort.  He has not been taking any narcotics.  Prolonged air leak-no air leak with repeated testing over several minutes.  Will discontinue chest tube.  We will repeat a chest x-ray prior to going home.  Went over inhaler use.  He will start using those on a more regular basis.  He had 2 separate stage Ia primaries.  No indication for adjuvant therapy.  Plan: Remove chest tube Chest x-ray prior to going home Return in 2 weeks with PA and lateral chest x-ray  Melrose Nakayama, MD [facility] [phone]  Post removal chest x-ray  CHEST - 2 VIEW SAME DAY  COMPARISON:  Chest x-ray 08/14/2019.  FINDINGS: Previously noted right-sided chest tube has been removed. Trace right apical pneumothorax (less than 5% of the volume of the right hemithorax) noted. Postoperative changes of right middle lobe wedge resection and right lower lobe superior  segmentectomy are again noted. Persistent opacity in the right perihilar region extending laterally, similar to recent prior examinations, likely to reflect residual postoperative atelectasis and/or scarring. Elevation of the right hemidiaphragm. Possible small volume of predominantly sub pulmonic right-sided pleural fluid. No left pleural effusion. Left lung is clear. No evidence of pulmonary edema. Heart size is normal. Upper mediastinal contours are within normal limits. Aortic atherosclerosis.  IMPRESSION: 1. Status post removal of right-sided chest tube with small right hydropneumothorax (small right pleural effusion and trace right pneumothorax), as above. Radiographic appearance of the chest is otherwise essentially unchanged.   Electronically Signed   By: Vinnie Langton M.D.  I reviewed the chest x-ray images.  Not sure there is any difference in the appearance of the right apex than there was prior to removal, if so it is really minimal.  Remo Lipps C.  Roxan Hockey, MD [facility] [phone]    On: 08/14/2019 11:04

## 2019-08-27 ENCOUNTER — Other Ambulatory Visit: Payer: Self-pay | Admitting: Thoracic Surgery (Cardiothoracic Vascular Surgery)

## 2019-08-27 DIAGNOSIS — Z902 Acquired absence of lung [part of]: Secondary | ICD-10-CM

## 2019-08-28 ENCOUNTER — Other Ambulatory Visit: Payer: Self-pay

## 2019-08-28 ENCOUNTER — Ambulatory Visit
Admission: RE | Admit: 2019-08-28 | Discharge: 2019-08-28 | Disposition: A | Discharge status: Home / Self Care | Payer: Medicare Other | Source: Ambulatory Visit | Attending: Thoracic Surgery (Cardiothoracic Vascular Surgery) | Admitting: Thoracic Surgery (Cardiothoracic Vascular Surgery)

## 2019-08-28 ENCOUNTER — Encounter: Payer: Self-pay | Admitting: Thoracic Surgery (Cardiothoracic Vascular Surgery)

## 2019-08-28 ENCOUNTER — Ambulatory Visit (INDEPENDENT_AMBULATORY_CARE_PROVIDER_SITE_OTHER): Payer: Self-pay | Admitting: Thoracic Surgery (Cardiothoracic Vascular Surgery)

## 2019-08-28 VITALS — BP 120/75 | HR 98 | Temp 97.7°F | Resp 20 | Ht 72.0 in | Wt 190.6 lb

## 2019-08-28 DIAGNOSIS — Z902 Acquired absence of lung [part of]: Secondary | ICD-10-CM

## 2019-08-28 NOTE — Progress Notes (Signed)
Larry [name]       ,[place] [address]             [phone]     HPI: Larry [name] returns for a scheduled follow-up visit  Larry [name] is a 69 year old gentleman with a history of tobacco abuse, COPD, left upper lobectomy for a stage IIb squamous cell carcinoma, hyperlipidemia, prostate cancer, and angiodysplasia of the cecum.  Unfortunately he continued to smoke after his previous left upper lobectomy.  In July 2020 he was found to have 2 small nodules in the right lung.  On follow-up one of the nodules had increased in size.  On PET both were mildly hypermetabolic.  I did a robotic wedge right middle lobe nodule and right lower lobe superior segmentectomy on 07/26/2019.  The middle lobe nodule was an adenocarcinoma, the superior segmental nodule was squamous cell.  Both were stage Ia lesions, all lymph nodes were negative.  He had air leak postoperatively and went home with a chest tube.  That was removed in the office 2 weeks ago.  He denies any incisional pain.  He is not aware of any significant wheezing.  He denies shortness of breath.  Past Medical History:  Diagnosis Date  . 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 Cataract   . Chronic cough   . COPD (chronic obstructive pulmonary disease) ([place])   . Dyspnea on exertion   . Full dentures   . Hx of adenomatous colonic polyps 06/07/2018  . Hyperlipidemia   . Non-small cell carcinoma of left lung [facility]) oncologist-- dr Julien Nordmann   dx Nov 2015left upper lobe, 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, N1, M0)  s/p  left upper lobectomy with node dissection and neoadjuvant systemic chemotherapy  . Productive cough    intermittantly  . Prostate cancer [facility]) urologist-- dr wrenn/  oncologsit-  dr Tammi Klippel   Stage T1c , Gleason 4+3, PSA 4.1, vol 26.73cc--  External RXT complete and Radiactive seed implants on 04-03-2015  . 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 acetaminophen (TYLENOL) 325 MG tablet Take 2 tablets (650 mg total) by mouth every 4 (four) hours as needed for mild pain. 100 tablet 2  . albuterol (PROVENTIL HFA;VENTOLIN HFA) 108 (90 Base) MCG/ACT inhaler Inhale 1-2 puffs into the lungs every 6 (six) hours as needed for wheezing or shortness of breath.    Marland Kitchen amLODipine (NORVASC) 10 MG tablet TAKE 1 TABLET BY MOUTH EVERY DAY (Patient taking differently: Take 10 mg by mouth daily. ) 90 tablet 1  . aspirin 81 MG chewable tablet Chew 81 mg by mouth daily.     Marland Kitchen buPROPion (WELLBUTRIN SR) 100 MG 12 hr tablet TAKE 1 TABLET BY MOUTH TWICE A DAY (Patient taking differently: Take 100 mg by mouth daily. ) 180 tablet 2  . metoprolol succinate (TOPROL-XL) 25 MG 24 hr tablet Take 1 tablet (25 mg total) by mouth daily. 30 tablet 3  . rosuvastatin (CRESTOR) 40 MG tablet Take 1 tablet (40 mg total) by mouth daily. 30 tablet 5  . SYMBICORT 160-4.5 MCG/ACT inhaler INHALE 2 PUFFS INTO THE LUNGS 2 TIMES DAILY 10.2 Inhaler 11  . Tiotropium Bromide-Olodaterol (STIOLTO RESPIMAT) 2.5-2.5 MCG/ACT AERS Inhale 2 puffs into the lungs daily. 8 g 0  . traMADol (ULTRAM) 50 MG tablet Take 1 tablet (50 mg total) by mouth every  6 (six) hours as needed (mild pain). 30 tablet 0   No current facility-administered medications for this visit.    Physical Exam BP 120/75 (BP Location: Left Arm, Patient Position: Sitting, Cuff Size: Normal)   Pulse 98   Temp 97.7 F (36.5 C)   Resp 20   Ht 6' (1.829 m)   Wt 190 lb 9.6 oz (86.5 kg)   SpO2 96% Comment: RA  BMI 25.46 kg/m  69 year old man in no acute distress Alert and oriented x3 with no focal deficits Lungs expiratory wheezes bilaterally Cardiac regular rate and rhythm normal S1 and S2 Incisions well-healed  Diagnostic Tests: CHEST - 2 VIEW  COMPARISON:  08/14/2019  FINDINGS: Bilateral emphysematous changes. Postoperative changes in the right middle lobe and right lower  lobe from prior segmentectomy. Linear airspace disease in the left perihilar region extending peripherally likely reflecting atelectasis versus scarring unchanged from the prior exam. Trace right pleural effusion versus postsurgical changes blunting the right costophrenic angle. No left pleural effusion. Stable cardiomediastinal silhouette. No aggressive osseous lesion.  IMPRESSION: 1. No acute cardiopulmonary disease. 2. Postoperative changes in the right middle lobe and right lower lobe from prior segmentectomy.   Electronically Signed   By: Kathreen Devoid   On: 08/28/2019 15:14 I personally reviewed the chest x-ray images and concur with the findings noted above  Impression: Larry [name] is a 69 year old gentleman with a history of tobacco abuse and COPD.  He had a stage IIb non-small cell carcinoma of the left upper lobe treated with neoadjuvant therapy followed by left upper lobectomy in 2016.  More recently he developed 2 right lung nodules that were both mildly hypermetabolic on PET/CT.  Stage Ia squamous cell carcinoma superior segment right lower lobe and stage Ia adenocarcinoma right middle lobe-plan is for observation.  He has an appointment scheduled with Dr. Julien Nordmann in July.  Postoperative prolonged air leak-resolved  Overall he is doing extremely well.  There are no restrictions on his activities.  I did encourage him to use his inhaler if he is having any wheezing or shortness of breath.  Plan:  Return in 2 months with PA and lateral chest x-ray Follow-up as scheduled with Dr. Thad Ranger, MD [facility] [phone]

## 2019-09-03 ENCOUNTER — Encounter: Payer: Self-pay | Admitting: Physician Assistant

## 2019-09-28 ENCOUNTER — Ambulatory Visit: Payer: Medicare Other | Admitting: Cardiology

## 2019-10-22 ENCOUNTER — Other Ambulatory Visit: Payer: Self-pay | Admitting: Thoracic Surgery (Cardiothoracic Vascular Surgery)

## 2019-10-22 DIAGNOSIS — C3492 Malignant neoplasm of unspecified part of left bronchus or lung: Secondary | ICD-10-CM

## 2019-10-23 ENCOUNTER — Ambulatory Visit: Payer: Self-pay | Admitting: Thoracic Surgery (Cardiothoracic Vascular Surgery)

## 2019-11-02 NOTE — Progress Notes (Signed)
No show

## 2019-11-05 ENCOUNTER — Ambulatory Visit: Payer: Medicare Other | Admitting: Cardiology

## 2019-11-13 ENCOUNTER — Ambulatory Visit (INDEPENDENT_AMBULATORY_CARE_PROVIDER_SITE_OTHER): Payer: Medicare Other | Admitting: Thoracic Surgery (Cardiothoracic Vascular Surgery)

## 2019-11-13 ENCOUNTER — Ambulatory Visit
Admission: RE | Admit: 2019-11-13 | Discharge: 2019-11-13 | Disposition: A | Discharge status: Home / Self Care | Payer: Medicare Other | Source: Ambulatory Visit | Attending: Thoracic Surgery (Cardiothoracic Vascular Surgery) | Admitting: Thoracic Surgery (Cardiothoracic Vascular Surgery)

## 2019-11-13 ENCOUNTER — Other Ambulatory Visit: Payer: Self-pay

## 2019-11-13 ENCOUNTER — Encounter: Payer: Self-pay | Admitting: Thoracic Surgery (Cardiothoracic Vascular Surgery)

## 2019-11-13 VITALS — BP 121/75 | HR 92 | Temp 97.9°F | Resp 18 | Ht 72.0 in | Wt 198.6 lb

## 2019-11-13 DIAGNOSIS — C3492 Malignant neoplasm of unspecified part of left bronchus or lung: Secondary | ICD-10-CM

## 2019-11-13 NOTE — Progress Notes (Signed)
[address]       [name] [address]             [phone]       HPI: [name] returns for a scheduled follow-up  Larry [name] is a 69 year old man with a history of tobacco abuse, COPD, and lung cancer.  He had neoadjuvant therapy followed by a left upper lobectomy in 2016 for stage IIb non-small cell carcinoma.  Recently a CT showed nodules in the right middle lobe in the superior segment of the lower lobe.  I did a robotic right middle lobe wedge resection and superior segmentectomy on 07/26/2019.  This turned out to be 2 separate primaries both stage Ia, 1 was a squamous cell and the other was adenoma.  Postoperatively he had an air leak and went home with a tube.  That was later removed in the office.  He saw Dr. Julien Nordmann.  No adjuvant therapy was indicated.  Currently is feeling well.  He does not have any pain.  He does get short of breath with exertion.  That is stable.  He has been using his inhalers.  Past Medical History:  Diagnosis Date  . 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 Cataract   . Chronic cough   . COPD (chronic obstructive pulmonary disease) ([place])   . Dyspnea on exertion   . Full dentures   . Hx of adenomatous colonic polyps 06/07/2018  . Hyperlipidemia   . Non-small cell carcinoma of left lung [facility]) oncologist-- dr Julien Nordmann   dx Nov 2015left upper lobe, 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, N1, M0)  s/p  left upper lobectomy with node dissection and neoadjuvant systemic chemotherapy  . Productive cough    intermittantly  . Prostate cancer [facility]) urologist-- dr wrenn/  oncologsit-  dr Tammi Klippel   Stage T1c , Gleason 4+3, PSA 4.1, vol 26.73cc--  External RXT complete and Radiactive seed implants on 04-03-2015  . 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 acetaminophen (TYLENOL) 325 MG tablet Take 2 tablets (650 mg total) by mouth every 4  (four) hours as needed for mild pain. 100 tablet 2  . albuterol (PROVENTIL HFA;VENTOLIN HFA) 108 (90 Base) MCG/ACT inhaler Inhale 1-2 puffs into the lungs every 6 (six) hours as needed for wheezing or shortness of breath.    Marland Kitchen amLODipine (NORVASC) 10 MG tablet TAKE 1 TABLET BY MOUTH EVERY DAY (Patient taking differently: Take 10 mg by mouth daily. ) 90 tablet 1  . aspirin 81 MG chewable tablet Chew 81 mg by mouth daily.     Marland Kitchen buPROPion (WELLBUTRIN SR) 100 MG 12 hr tablet TAKE 1 TABLET BY MOUTH TWICE A DAY (Patient taking differently: Take 100 mg by mouth daily. ) 180 tablet 2  . metoprolol succinate (TOPROL-XL) 25 MG 24 hr tablet Take 1 tablet (25 mg total) by mouth daily. 30 tablet 3  . rosuvastatin (CRESTOR) 40 MG tablet Take 1 tablet (40 mg total) by mouth daily. 30 tablet 5  . SYMBICORT 160-4.5 MCG/ACT inhaler INHALE 2 PUFFS INTO THE LUNGS 2 TIMES DAILY 10.2 Inhaler 11  . Tiotropium Bromide-Olodaterol (STIOLTO RESPIMAT) 2.5-2.5 MCG/ACT AERS Inhale 2 puffs into the lungs daily. 8 g 0  . traMADol (ULTRAM) 50 MG tablet Take 1 tablet (50 mg total) by mouth every 6 (six) hours as needed (mild pain). 30 tablet  0   No current facility-administered medications for this visit.    Physical Exam BP 121/75 (BP Location: Left Arm, Patient Position: Sitting, Cuff Size: Normal)   Pulse 92   Temp 97.9 F (36.6 C)   Resp 18   Ht 6' (1.829 m)   Wt 198 lb 9.6 oz (90.1 kg)   SpO2 94% Comment: RA  BMI 26.52 kg/m  69 year old man in no acute distress Alert and oriented x3 with no focal deficits Lungs faint wheezes bilaterally, diminished breath sounds at left base Cardiac regular rate and rhythm  Diagnostic Tests: CHEST - 2 VIEW  COMPARISON:  08/28/2019  FINDINGS: Previous left lobectomy. No focal finding in the left lung. Chronic appearing scarring in the right perihilar region, with postsurgical sutures. No sign of increasing density. Chronic pleural scarring. No sign of pleural effusion. No  acute bone finding.  IMPRESSION: No change or active process compared to 08/28/2019. Previous left lobectomy. Previous postsurgical scarring in the right mid lung.   Electronically Signed   By: Nelson Chimes M.D.   On: 11/13/2019 14:01 I personally reviewed the chest x-ray images and concur with the findings noted above  Impression: Larry [name] is a 69 year old man with a history of tobacco abuse, COPD, and stage IIb non-small cell carcinoma in 2016.  He presented with enlarging middle and lower lobe nodules on the right side.  We resected those and they turned out to be 2 separate stage Ia non-small cell carcinomas.  Surgical standpoint is doing well.  He does not have any incisional pain.  Exercise tolerance is limited, but that is unchanged.  He has wheezing on exam.  He is using his bronchodilators including his rescue albuterol as needed.  Plan: Follow-up as scheduled with Dr. Julien Nordmann Return in 8 months for 1 year follow-up  Melrose Nakayama, MD [facility] [phone]

## 2019-11-25 ENCOUNTER — Other Ambulatory Visit: Payer: Self-pay | Admitting: Physician Assistant

## 2019-12-16 ENCOUNTER — Other Ambulatory Visit: Payer: Self-pay | Admitting: Cardiology

## 2019-12-16 DIAGNOSIS — Z720 Tobacco use: Secondary | ICD-10-CM

## 2019-12-17 ENCOUNTER — Other Ambulatory Visit: Payer: Self-pay

## 2019-12-17 ENCOUNTER — Inpatient Hospital Stay: Payer: Medicare Other | Attending: Internal Medicine

## 2019-12-17 DIAGNOSIS — Z7951 Long term (current) use of inhaled steroids: Secondary | ICD-10-CM | POA: Diagnosis not present

## 2019-12-17 DIAGNOSIS — Z79899 Other long term (current) drug therapy: Secondary | ICD-10-CM | POA: Diagnosis not present

## 2019-12-17 DIAGNOSIS — J449 Chronic obstructive pulmonary disease, unspecified: Secondary | ICD-10-CM | POA: Diagnosis not present

## 2019-12-17 DIAGNOSIS — Z7982 Long term (current) use of aspirin: Secondary | ICD-10-CM | POA: Insufficient documentation

## 2019-12-17 DIAGNOSIS — Z85118 Personal history of other malignant neoplasm of bronchus and lung: Secondary | ICD-10-CM | POA: Insufficient documentation

## 2019-12-17 DIAGNOSIS — R918 Other nonspecific abnormal finding of lung field: Secondary | ICD-10-CM | POA: Diagnosis present

## 2019-12-17 DIAGNOSIS — Z8546 Personal history of malignant neoplasm of prostate: Secondary | ICD-10-CM | POA: Insufficient documentation

## 2019-12-17 DIAGNOSIS — C3490 Malignant neoplasm of unspecified part of unspecified bronchus or lung: Secondary | ICD-10-CM

## 2019-12-17 DIAGNOSIS — E785 Hyperlipidemia, unspecified: Secondary | ICD-10-CM | POA: Diagnosis not present

## 2019-12-17 DIAGNOSIS — Z902 Acquired absence of lung [part of]: Secondary | ICD-10-CM | POA: Diagnosis not present

## 2019-12-17 LAB — CBC WITH DIFFERENTIAL (CANCER CENTER ONLY)
Abs Immature Granulocytes: 0.02 10*3/uL (ref 0.00–0.07)
Basophils Absolute: 0.1 10*3/uL (ref 0.0–0.1)
Basophils Relative: 1 %
Eosinophils Absolute: 0.3 10*3/uL (ref 0.0–0.5)
Eosinophils Relative: 3 %
HCT: 35 % — ABNORMAL LOW (ref 39.0–52.0)
Hemoglobin: 11.5 g/dL — ABNORMAL LOW (ref 13.0–17.0)
Immature Granulocytes: 0 %
Lymphocytes Relative: 28 %
Lymphs Abs: 2.5 10*3/uL (ref 0.7–4.0)
MCH: 23.7 pg — ABNORMAL LOW (ref 26.0–34.0)
MCHC: 32.9 g/dL (ref 30.0–36.0)
MCV: 72 fL — ABNORMAL LOW (ref 80.0–100.0)
Monocytes Absolute: 0.9 10*3/uL (ref 0.1–1.0)
Monocytes Relative: 10 %
Neutro Abs: 5.3 10*3/uL (ref 1.7–7.7)
Neutrophils Relative %: 58 %
Platelet Count: 263 10*3/uL (ref 150–400)
RBC: 4.86 MIL/uL (ref 4.22–5.81)
RDW: 21.1 % — ABNORMAL HIGH (ref 11.5–15.5)
WBC Count: 9.1 10*3/uL (ref 4.0–10.5)
nRBC: 0 % (ref 0.0–0.2)

## 2019-12-17 LAB — CMP (CANCER CENTER ONLY)
ALT: 15 U/L (ref 0–44)
AST: 21 U/L (ref 15–41)
Albumin: 3.8 g/dL (ref 3.5–5.0)
Alkaline Phosphatase: 93 U/L (ref 38–126)
Anion gap: 12 (ref 5–15)
BUN: 11 mg/dL (ref 8–23)
CO2: 22 mmol/L (ref 22–32)
Calcium: 9.6 mg/dL (ref 8.9–10.3)
Chloride: 105 mmol/L (ref 98–111)
Creatinine: 0.85 mg/dL (ref 0.61–1.24)
GFR, Est AFR Am: >60 mL/min (ref >60)
GFR, Estimated: >60 mL/min (ref >60)
Glucose, Bld: 111 mg/dL — ABNORMAL HIGH (ref 70–99)
Potassium: 3.9 mmol/L (ref 3.5–5.1)
Sodium: 139 mmol/L (ref 135–145)
Total Bilirubin: 0.3 mg/dL (ref 0.3–1.2)
Total Protein: 8.1 g/dL (ref 6.5–8.1)

## 2019-12-18 ENCOUNTER — Ambulatory Visit (HOSPITAL_COMMUNITY)
Admission: RE | Admit: 2019-12-18 | Discharge: 2019-12-18 | Disposition: A | Discharge status: Home / Self Care | Payer: Medicare Other | Source: Ambulatory Visit | Attending: Internal Medicine | Admitting: Internal Medicine

## 2019-12-18 DIAGNOSIS — C3490 Malignant neoplasm of unspecified part of unspecified bronchus or lung: Secondary | ICD-10-CM | POA: Diagnosis present

## 2019-12-18 MED ORDER — IOHEXOL 300 MG/ML  SOLN
75.0000 mL | Freq: Once | INTRAMUSCULAR | Status: AC | PRN
  Administered 2019-12-18: 75 mL via INTRAVENOUS

## 2019-12-18 MED ORDER — SODIUM CHLORIDE (PF) 0.9 % IJ SOLN
INTRAMUSCULAR | Status: AC
  Filled 2019-12-18: qty 50

## 2019-12-19 ENCOUNTER — Encounter: Payer: Self-pay | Admitting: *Deleted

## 2019-12-19 ENCOUNTER — Inpatient Hospital Stay (HOSPITAL_BASED_OUTPATIENT_CLINIC_OR_DEPARTMENT_OTHER): Payer: Medicare Other | Admitting: Internal Medicine

## 2019-12-19 ENCOUNTER — Encounter: Payer: Self-pay | Admitting: Internal Medicine

## 2019-12-19 ENCOUNTER — Other Ambulatory Visit: Payer: Self-pay

## 2019-12-19 VITALS — BP 127/76 | HR 90 | Temp 98.1°F | Resp 20 | Ht 72.0 in | Wt 202.4 lb

## 2019-12-19 DIAGNOSIS — I10 Essential (primary) hypertension: Secondary | ICD-10-CM

## 2019-12-19 DIAGNOSIS — R918 Other nonspecific abnormal finding of lung field: Secondary | ICD-10-CM | POA: Diagnosis not present

## 2019-12-19 DIAGNOSIS — C3490 Malignant neoplasm of unspecified part of unspecified bronchus or lung: Secondary | ICD-10-CM

## 2019-12-19 DIAGNOSIS — C3492 Malignant neoplasm of unspecified part of left bronchus or lung: Secondary | ICD-10-CM | POA: Diagnosis not present

## 2019-12-19 NOTE — Progress Notes (Signed)
I spoke to patient today regarding his smoking cessation.  He states he has cut down on smoking to only 2 cigarettes a day.  I gave him my phone number to call if he needed more help to quit.

## 2019-12-19 NOTE — Patient Instructions (Signed)
Steps to Quit Smoking Smoking tobacco is the leading cause of preventable death. It can affect almost every organ in the body. Smoking puts you and people around you at risk for many serious, long-lasting (chronic) diseases. Quitting smoking can be hard, but it is one of the best things that you can do for your health. It is never too late to quit. How do I get ready to quit? When you decide to quit smoking, make a plan to help you succeed. Before you quit:  Pick a date to quit. Set a date within the next 2 weeks to give you time to prepare.  Write down the reasons why you are quitting. Keep this list in places where you will see it often.  Tell your family, friends, and co-workers that you are quitting. Their support is important.  Talk with your doctor about the choices that may help you quit.  Find out if your health insurance will pay for these treatments.  Know the people, places, things, and activities that make you want to smoke (triggers). Avoid them. What first steps can I take to quit smoking?  Throw away all cigarettes at home, at work, and in your car.  Throw away the things that you use when you smoke, such as ashtrays and lighters.  Clean your car. Make sure to empty the ashtray.  Clean your home, including curtains and carpets. What can I do to help me quit smoking? Talk with your doctor about taking medicines and seeing a counselor at the same time. You are more likely to succeed when you do both.  If you are pregnant or breastfeeding, talk with your doctor about counseling or other ways to quit smoking. Do not take medicine to help you quit smoking unless your doctor tells you to do so. To quit smoking: Quit right away  Quit smoking totally, instead of slowly cutting back on how much you smoke over a period of time.  Go to counseling. You are more likely to quit if you go to counseling sessions regularly. Take medicine You may take medicines to help you quit. Some  medicines need a prescription, and some you can buy over-the-counter. Some medicines may contain a drug called nicotine to replace the nicotine in cigarettes. Medicines may:  Help you to stop having the desire to smoke (cravings).  Help to stop the problems that come when you stop smoking (withdrawal symptoms). Your doctor may ask you to use:  Nicotine patches, gum, or lozenges.  Nicotine inhalers or sprays.  Non-nicotine medicine that is taken by mouth. Find resources Find resources and other ways to help you quit smoking and remain smoke-free after you quit. These resources are most helpful when you use them often. They include:  Online chats with a counselor.  Phone quitlines.  Printed self-help materials.  Support groups or group counseling.  Text messaging programs.  Mobile phone apps. Use apps on your mobile phone or tablet that can help you stick to your quit plan. There are many free apps for mobile phones and tablets as well as websites. Examples include Quit Guide from the CDC and smokefree.gov  What things can I do to make it easier to quit?   Talk to your family and friends. Ask them to support and encourage you.  Call a phone quitline (1-800-QUIT-NOW), reach out to support groups, or work with a counselor.  Ask people who smoke to not smoke around you.  Avoid places that make you want to smoke,   such as: ? Bars. ? Parties. ? Smoke-break areas at work.  Spend time with people who do not smoke.  Lower the stress in your life. Stress can make you want to smoke. Try these things to help your stress: ? Getting regular exercise. ? Doing deep-breathing exercises. ? Doing yoga. ? Meditating. ? Doing a body scan. To do this, close your eyes, focus on one area of your body at a time from head to toe. Notice which parts of your body are tense. Try to relax the muscles in those areas. How will I feel when I quit smoking? Day 1 to 3 weeks Within the first 24 hours,  you may start to have some problems that come from quitting tobacco. These problems are very bad 2-3 days after you quit, but they do not often last for more than 2-3 weeks. You may get these symptoms:  Mood swings.  Feeling restless, nervous, angry, or annoyed.  Trouble concentrating.  Dizziness.  Strong desire for high-sugar foods and nicotine.  Weight gain.  Trouble pooping (constipation).  Feeling like you may vomit (nausea).  Coughing or a sore throat.  Changes in how the medicines that you take for other issues work in your body.  Depression.  Trouble sleeping (insomnia). Week 3 and afterward After the first 2-3 weeks of quitting, you may start to notice more positive results, such as:  Better sense of smell and taste.  Less coughing and sore throat.  Slower heart rate.  Lower blood pressure.  Clearer skin.  Better breathing.  Fewer sick days. Quitting smoking can be hard. Do not give up if you fail the first time. Some people need to try a few times before they succeed. Do your best to stick to your quit plan, and talk with your doctor if you have any questions or concerns. Summary  Smoking tobacco is the leading cause of preventable death. Quitting smoking can be hard, but it is one of the best things that you can do for your health.  When you decide to quit smoking, make a plan to help you succeed.  Quit smoking right away, not slowly over a period of time.  When you start quitting, seek help from your doctor, family, or friends. This information is not intended to replace advice given to you by your health care provider. Make sure you discuss any questions you have with your health care provider. Document Revised: 02/09/2019 Document Reviewed: 08/05/2018 Elsevier Patient Education  2020 [employer]vier Inc.  

## 2019-12-19 NOTE — Progress Notes (Signed)
[place] Telephone:([phone]) [phone]   Fax:([phone]) [phone]  OFFICE PROGRESS NOTE  Larry Beals, NP [place] [id number]  DIAGNOSIS:  1) Stage IIA (T1a, N1, M0) non-small cell lung cancer, squamous cell carcinoma diagnosed in November 2015, presented with left suprahilar soft tissue mass. 2) history of prostate adenocarcinoma  PRIOR THERAPY:  1) Neoadjuvant systemic chemotherapy with carboplatin for AUC of 5 and paclitaxel 175 MG/M2 every 3 weeks with Neulasta support. First dose 06/13/2014. Status post 3 cycle. 2) Status post Left video-assisted thoracoscopy, Thoracoscopic left upper lobectomy, Mediastinal lymph node dissection under the care of Dr. Roxan Hockey on 09/09/2014. 3) right lower lobe superior segmentectomy as well as right middle lobe wedge resection under the care of Dr. Roxan Hockey in February 2021.  CURRENT THERAPY: Observation.  INTERVAL HISTORY: Larry [name] 69 y.o. male returns to the clinic today for follow-up visit.  The patient is feeling fine today with no concerning complaints except for the baseline shortness of breath increased with exertion.  He denied having any chest pain but has cough with no hemoptysis.  He denied having any recent weight loss or night sweats.  He has no nausea, vomiting, diarrhea or constipation.  He denied having any headache or visual changes.  The patient had repeat CT scan of the chest performed recently and is here for evaluation and discussion of his discuss results.   MEDICAL HISTORY: Past Medical History:  Diagnosis Date  . 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 Cataract   . Chronic cough   . COPD (chronic obstructive pulmonary disease) ([place])   . Dyspnea on exertion   . Full dentures   . Hx of adenomatous colonic polyps 06/07/2018  . Hyperlipidemia   . Non-small cell carcinoma of left lung [facility]) oncologist-- dr Julien Nordmann   dx Nov 2015left upper lobe, Squamous Cell Carcinoma -- Stage  IIA (T1a, N1, M0)  s/p  left upper lobectomy with node dissection and neoadjuvant systemic chemotherapy  . Productive cough    intermittantly  . Prostate cancer [facility]) urologist-- dr wrenn/  oncologsit-  dr Tammi Klippel   Stage T1c , Gleason 4+3, PSA 4.1, vol 26.73cc--  External RXT complete and Radiactive seed implants on 04-03-2015  . Stage II squamous cell carcinoma of left lung ([place]) 05/22/2014        ALLERGIES:  has No Known Allergies.  MEDICATIONS:  Current Outpatient Medications  Medication Sig Dispense Refill  . acetaminophen (TYLENOL) 325 MG tablet Take 2 tablets (650 mg total) by mouth every 4 (four) hours as needed for mild pain. 100 tablet 2  . albuterol (PROVENTIL HFA;VENTOLIN HFA) 108 (90 Base) MCG/ACT inhaler Inhale 1-2 puffs into the lungs every 6 (six) hours as needed for wheezing or shortness of breath.    Marland Kitchen amLODipine (NORVASC) 10 MG tablet TAKE 1 TABLET BY MOUTH EVERY DAY (Patient taking differently: Take 10 mg by mouth daily. ) 90 tablet 1  . aspirin 81 MG chewable tablet Chew 81 mg by mouth daily.     Marland Kitchen buPROPion (WELLBUTRIN SR) 100 MG 12 hr tablet TAKE 1 TABLET BY MOUTH TWICE A DAY 180 tablet 2  . metoprolol succinate (TOPROL-XL) 25 MG 24 hr tablet TAKE 1 TABLET BY MOUTH EVERY DAY 90 tablet 1  . rosuvastatin (CRESTOR) 40 MG tablet Take 1 tablet (40 mg total) by mouth daily. 30 tablet 5  . SYMBICORT 160-4.5 MCG/ACT inhaler INHALE 2 PUFFS INTO THE LUNGS 2 TIMES DAILY 10.2 Inhaler 11  .  Tiotropium Bromide-Olodaterol (STIOLTO RESPIMAT) 2.5-2.5 MCG/ACT AERS Inhale 2 puffs into the lungs daily. 8 g 0  . traMADol (ULTRAM) 50 MG tablet Take 1 tablet (50 mg total) by mouth every 6 (six) hours as needed (mild pain). 30 tablet 0   No current facility-administered medications for this visit.    SURGICAL HISTORY:  Past Surgical History:  Procedure Laterality Date  . BRONCHOSCOPY Right 07/26/2019   VIDEO BRONCHOSCOPY WITH ENDOBRONCHIAL NAVIGATION (N/A )   . COLONOSCOPY  12/ 2009    . ENDOBRONCHIAL ULTRASOUND Bilateral 04/08/2014   Procedure: ENDOBRONCHIAL ULTRASOUND;  Surgeon: Kathee Delton, MD;  Location: [facility];  Service: Cardiopulmonary;  Laterality: Bilateral;  . INTERCOSTAL NERVE BLOCK Right 07/26/2019   Procedure: Intercostal Nerve Block;  Surgeon: Melrose Nakayama, MD;  Location: [place];  Service: Thoracic;  Laterality: Right;  . NODE DISSECTION Right 07/26/2019   Procedure: Node Dissection;  Surgeon: Melrose Nakayama, MD;  Location: [place];  Service: Thoracic;  Laterality: Right;  . RADIOACTIVE SEED IMPLANT N/A 04/03/2015   Procedure: RADIOACTIVE SEED IMPLANT/BRACHYTHERAPY IMPLANT;  Surgeon: Irine Seal, MD;  Location: [facility];  Service: Urology;  Laterality: N/A;  . VIDEO ASSISTED THORACOSCOPY (VATS)/ LOBECTOMY Left 09/09/2014   Procedure: LEFT VIDEO ASSISTED THORACOSCOPY (VATS) with LEFT UPPER LOBECTOMY;  Surgeon: Melrose Nakayama, MD;  Location: [place];  Service: Thoracic;  Laterality: Left;  Marland Kitchen VIDEO BRONCHOSCOPY WITH ENDOBRONCHIAL NAVIGATION Right 04/24/2014   Procedure: VIDEO BRONCHOSCOPY WITH ENDOBRONCHIAL NAVIGATION;  Surgeon: Collene Gobble, MD;  Location: [place];  Service: Thoracic;  Laterality: Right;  Marland Kitchen VIDEO BRONCHOSCOPY WITH ENDOBRONCHIAL NAVIGATION N/A 07/26/2019   Procedure: VIDEO BRONCHOSCOPY WITH ENDOBRONCHIAL NAVIGATION;  Surgeon: Melrose Nakayama, MD;  Location: [place];  Service: Thoracic;  Laterality: N/A;  . VIDEO BRONCHOSCOPY WITH ENDOBRONCHIAL ULTRASOUND Right 04/24/2014   Procedure: VIDEO BRONCHOSCOPY WITH ENDOBRONCHIAL ULTRASOUND;  Surgeon: Collene Gobble, MD;  Location: [place];  Service: Thoracic;  Laterality: Right;  . WEDGE RESECTION  07/26/2019    REVIEW OF SYSTEMS:  A comprehensive review of systems was negative except for: Constitutional: positive for fatigue Respiratory: positive for cough and dyspnea on exertion   PHYSICAL EXAMINATION: General appearance: alert, cooperative, fatigued and no  distress Head: Normocephalic, without obvious abnormality, atraumatic Neck: no adenopathy, no JVD, supple, symmetrical, trachea midline and thyroid not enlarged, symmetric, no tenderness/mass/nodules Lymph nodes: Cervical, supraclavicular, and axillary nodes normal. Resp: wheezes bilaterally Back: symmetric, no curvature. ROM normal. No CVA tenderness. Cardio: regular rate and rhythm, S1, S2 normal, no murmur, click, rub or gallop GI: soft, non-tender; bowel sounds normal; no masses,  no organomegaly Extremities: extremities normal, atraumatic, no cyanosis or edema  ECOG PERFORMANCE STATUS: 1 - Symptomatic but completely ambulatory  Blood pressure 127/76, pulse 90, temperature 98.1 F (36.7 C), temperature source Temporal, resp. rate 20, height 6' (1.829 m), weight 202 lb 6.4 oz (91.8 kg), SpO2 98 %.  LABORATORY DATA: Lab Results  Component Value Date   WBC 9.1 12/17/2019   HGB 11.5 (L) 12/17/2019   HCT 35.0 (L) 12/17/2019   MCV 72.0 (L) 12/17/2019   PLT 263 12/17/2019      Chemistry      Component Value Date/Time   NA 139 12/17/2019 0946   NA 139 05/25/2017 0734   K 3.9 12/17/2019 0946   K 3.7 05/25/2017 0734   CL 105 12/17/2019 0946   CO2 22 12/17/2019 0946   CO2 23 05/25/2017 0734   BUN 11 12/17/2019 0946  BUN 10.2 05/25/2017 0734   CREATININE 0.85 12/17/2019 0946   CREATININE 0.7 05/25/2017 0734      Component Value Date/Time   CALCIUM 9.6 12/17/2019 0946   CALCIUM 9.5 05/25/2017 0734   ALKPHOS 93 12/17/2019 0946   ALKPHOS 80 05/25/2017 0734   AST 21 12/17/2019 0946   AST 18 05/25/2017 0734   ALT 15 12/17/2019 0946   ALT 17 05/25/2017 0734   BILITOT 0.3 12/17/2019 0946   BILITOT 0.29 05/25/2017 0734       RADIOGRAPHIC STUDIES: CT Chest W Contrast  Result Date: 12/18/2019 CLINICAL DATA:  Primary Cancer Type: Lung Imaging Indication: Restaging for new clinical signs or symptoms Interval therapy since last imaging?  Yes, resection Initial Cancer Diagnosis  Date: 04/08/2014; Established by: Biopsy-proven Detailed Pathology: Stage IIA non-small cell carcinoma, squamous cell carcinoma. Primary Tumor location: Left upper lobe. Recurrence?  Yes; Date(s) of recurrence: 07/26/2019 Established by: Biopsy-proven. Two separate primaries both stage Ia; 1 squamous cell and 1 adenoma. Right middle and lower lobes. Surgeries: Left upper lobectomy 09/09/2014. Right middle lobe wedge resection and superior segmentectomy 07/26/2019. Chemotherapy: Yes; Ongoing?  No; Most recent administration: 2016 Immunotherapy?  Yes; Type: Neulasta; Ongoing? No Radiation therapy?  No thoracic. Other Cancers: Prostate cancer; radiotherapy and seed implants 2016. EXAM: CT CHEST WITH CONTRAST TECHNIQUE: Multidetector CT imaging of the chest was performed during intravenous contrast administration. CONTRAST:  35mL OMNIPAQUE IOHEXOL 300 MG/ML  SOLN COMPARISON:  Most recent CT chest 11/30/2018.  06/18/2019 PET-CT. FINDINGS: Cardiovascular: The heart size is normal. No substantial pericardial effusion. Coronary artery calcification is evident. Atherosclerotic calcification is noted in the wall of the thoracic aorta. Mediastinum/Nodes: No mediastinal lymphadenopathy. There is no hilar lymphadenopathy. The esophagus has normal imaging features. There is no axillary lymphadenopathy. Lungs/Pleura: Centrilobular and paraseptal emphysema evident. Surgical changes in the right middle lobe are new in the interval and compatible with wedge resection. Patient has also had interval right superior segmentectomy. Soft tissue attenuation tracking along the right middle lobe staple line is associated with some volume loss, likely all related to scarring. Scarring in the posterior right lung along the surgical site also evident. Today's study establishes the patient's new postoperative baseline and attention to these areas of surgery on follow-up recommended. Volume loss left hemithorax is compatible with previous left  upper lobectomy no new suspicious pulmonary nodule or mass. No focal airspace consolidation. No pleural effusion. Upper Abdomen: Unremarkable. Musculoskeletal: No worrisome lytic or sclerotic osseous abnormality. IMPRESSION: 1. Interval resection of the right middle and lower lobe pulmonary lesions. Postsurgical scarring noted without definite findings of recurrent or metastatic disease in the chest. 2.  Emphysema (ICD10-J43.9) and Aortic Atherosclerosis (ICD10-170.0) Electronically Signed   By: Misty Stanley M.D.   On: 12/18/2019 11:35    ASSESSMENT AND PLAN:  This is a very pleasant 70 years old African-American male with a stage IIA non-small cell lung cancer, squamous cell carcinoma status post neoadjuvant systemic chemotherapy followed by left upper lobectomy and lymph node dissection in April 2016. The patient has been on observation since that time. Recent CT scan of the chest showed enlarging spiculated nodule in the superior segment of the right lower lobe highly suspicious for recurrent adenocarcinoma.  There was also a partially solid nodule in the right middle lobe with slowly increased size also suspicious for low-grade adenocarcinoma. His PET scan showed low hypermetabolic activity in the suspicious nodule but still suspicious for slowly growing adenocarcinoma. He underwent surgical resection of the right lower lobe  superior segmentectomy as well as right middle lobe wedge resection under the care of Dr. Roxan Hockey in February 2021. The patient had repeat CT scan of the chest performed recently.  I personally and independently reviewed the scan and discussed the results with the patient today. His scan showed no concerning findings for disease recurrence or metastasis. I recommended for him to continue on observation with repeat CT scan of the chest in 1 year. The patient voices understanding of current disease status and treatment options and is in agreement with the current care  plan. All questions were answered. The patient knows to call the clinic with any problems, questions or concerns. We can certainly see the patient much sooner if necessary.  Disclaimer: This note was dictated with voice recognition software. Similar sounding words can inadvertently be transcribed and may not be corrected upon review.

## 2019-12-20 ENCOUNTER — Other Ambulatory Visit: Payer: Self-pay

## 2019-12-20 DIAGNOSIS — I10 Essential (primary) hypertension: Secondary | ICD-10-CM

## 2019-12-20 MED ORDER — AMLODIPINE BESYLATE 10 MG PO TABS: 10.0000 mg | ORAL_TABLET | Freq: Every day | ORAL | 1 refills | Status: AC

## 2020-01-21 ENCOUNTER — Encounter: Payer: Self-pay | Admitting: Cardiology

## 2020-01-21 ENCOUNTER — Other Ambulatory Visit: Payer: Self-pay

## 2020-01-21 ENCOUNTER — Ambulatory Visit: Payer: Medicare Other | Admitting: Cardiology

## 2020-01-21 VITALS — BP 148/74 | HR 101 | Resp 17 | Ht 72.0 in | Wt 203.0 lb

## 2020-01-21 DIAGNOSIS — I2510 Atherosclerotic heart disease of native coronary artery without angina pectoris: Secondary | ICD-10-CM

## 2020-01-21 DIAGNOSIS — I10 Essential (primary) hypertension: Secondary | ICD-10-CM

## 2020-01-21 DIAGNOSIS — E782 Mixed hyperlipidemia: Secondary | ICD-10-CM

## 2020-01-21 DIAGNOSIS — F17200 Nicotine dependence, unspecified, uncomplicated: Secondary | ICD-10-CM

## 2020-01-21 MED ORDER — METOPROLOL SUCCINATE ER 50 MG PO TB24: 50.0000 mg | ORAL_TABLET | Freq: Every day | ORAL | 3 refills | Status: AC

## 2020-01-21 NOTE — Progress Notes (Signed)
Patient referred by [name], NP for CAD  Subjective:   Larry [name], male    DOB: 1951/01/13, 69 y.o.   MRN: [id number]   Chief Complaint  Patient presents with  . AAA  . Follow-up     HPI  69 y/o [place] American male with hypertension, hyperlipidemia, COPD, IIb non-small cell carcinoma (2016), stage Ia non-small cell carcinomas (2021), CAD noted on CT chest.  Patient does not have any angina symptoms. Unfortunately, he still smokes "every now and then". Blood pressure is elevated today.    Current Outpatient Medications on File Prior to Visit  Medication Sig Dispense Refill  . albuterol (PROVENTIL HFA;VENTOLIN HFA) 108 (90 Base) MCG/ACT inhaler Inhale 1-2 puffs into the lungs every 6 (six) hours as needed for wheezing or shortness of breath.    Marland Kitchen aspirin 81 MG chewable tablet Chew 81 mg by mouth daily.     Marland Kitchen buPROPion (WELLBUTRIN SR) 100 MG 12 hr tablet TAKE 1 TABLET BY MOUTH TWICE A DAY 180 tablet 2  . metoprolol succinate (TOPROL-XL) 25 MG 24 hr tablet TAKE 1 TABLET BY MOUTH EVERY DAY 90 tablet 1  . rosuvastatin (CRESTOR) 40 MG tablet Take 1 tablet (40 mg total) by mouth daily. 30 tablet 5  . SYMBICORT 160-4.5 MCG/ACT inhaler INHALE 2 PUFFS INTO THE LUNGS 2 TIMES DAILY 10.2 Inhaler 11  . Tiotropium Bromide-Olodaterol (STIOLTO RESPIMAT) 2.5-2.5 MCG/ACT AERS Inhale 2 puffs into the lungs daily. 8 g 0  . traMADol (ULTRAM) 50 MG tablet Take 1 tablet (50 mg total) by mouth every 6 (six) hours as needed (mild pain). 30 tablet 0  . amLODipine (NORVASC) 10 MG tablet Take 1 tablet (10 mg total) by mouth daily. 90 tablet 1   No current facility-administered medications on file prior to visit.    Cardiovascular studies:  EKG 2/232021: Sinus rhythm 65 bpm. Old anteroseptal infarct.   Echocardiogram 03/20/2019: Left ventricle cavity is normal in size. Moderate concentric hypertrophy of the left ventricle. Normal LV systolic function with EF 55%. Normal global wall  motion. Doppler evidence of grade I (impaired) diastolic dysfunction, normal LAP.  Left atrial cavity is normal in size. Aneurysmal interatrial septum without 2D or color Doppler evidence of interatrial shunt. Mild tricuspid regurgitation. Estimated pulmonary artery systolic pressure is 24 mmHg.  Abdominal Aortic Duplex  03/20/2019: The maximum aorta (sac) diameter is 2.79 cm (dist). Moderate plaque observed in the distal aorta. Mild plaque observed in the proximal and mid aorta. Normal flow velocities noted in the abdominal aorta and internal iliac arteries bilaterally.  An abdominal aortic aneurysm measuring 2.71 x 2.73 x 2.79 cm is seen in the distal aorta. Recheck in 5 years for stability.    CT chest 11/30/2018: 1. Status post left upper lobectomy, without recurrent or metastatic disease. 2. Mixed attenuation right middle lobe pulmonary nodule is similar to on the prior exam. Recommend attention on follow-up. 3. Other areas of pulmonary nodularity are favored to represent foci of scarring, and are not significantly changed. 4. Aortic atherosclerosis (ICD10-I70.0), coronary artery atherosclerosis and emphysema (ICD10-J43.9).  Recent labs: 12/17/2019: Glucose 111, BUN/Cr 11/0.85. EGFR >60. Na/K 139/3.9. Rest of the CMP normal H/H 11.5/35. MCV 72. Platelets 263  03/2019: Chol 215, TG 144, HDL 47, LDL 142    Review of Systems  Constitution: Negative for decreased appetite, malaise/fatigue, weight gain and weight loss.  HENT: Negative for congestion.   Eyes: Negative for visual disturbance.  Cardiovascular: Negative for chest pain, dyspnea  on exertion, leg swelling, palpitations and syncope.  Respiratory: Positive for shortness of breath (Only when he smokes). Negative for cough.   Endocrine: Negative for cold intolerance.  Hematologic/Lymphatic: Does not bruise/bleed easily.  Skin: Negative for itching and rash.  Musculoskeletal: Negative for myalgias.  Gastrointestinal: Negative  for abdominal pain, nausea and vomiting.  Genitourinary: Negative for dysuria.  Neurological: Negative for dizziness and weakness.  Psychiatric/Behavioral: The patient is not nervous/anxious.   All other systems reviewed and are negative.       Vitals:   01/21/20 1356  BP: (!) 148/74  Pulse: (!) 101  Resp: 17  SpO2: 98%    = Body mass index is 27.53 kg/m. Filed Weights   01/21/20 1356  Weight: 203 lb (92.1 kg)     Objective:   Physical Exam  Constitutional: He is oriented to person, place, and time. He appears well-developed and well-nourished. No distress.  HENT:  Head: Normocephalic and atraumatic.  Eyes: Pupils are equal, round, and reactive to light. Conjunctivae are normal.  Neck: No JVD present.  Cardiovascular: Normal rate and regular rhythm.  No murmur heard. Pulses:      Femoral pulses are 2+ on the right side and 2+ on the left side.      Dorsalis pedis pulses are 1+ on the right side and 1+ on the left side.       Posterior tibial pulses are 0 on the right side and 0 on the left side.  Pulmonary/Chest: Effort normal and breath sounds normal. He has no wheezes. He has no rales.  Abdominal: Soft. Bowel sounds are normal. There is no rebound.  Musculoskeletal:        General: No edema.  Lymphadenopathy:    He has no cervical adenopathy.  Neurological: He is alert and oriented to person, place, and time. No cranial nerve deficit.  Skin: Skin is warm and dry.  Psychiatric: He has a normal mood and affect.  Nursing note and vitals reviewed.         Assessment & Recommendations:   69 y/o [place] male with hyperlipidemia, COPD, 1) Stage IIA (T1a, N1, M0) non-small cell lung cancer, squamous cell carcinoma s/pVATS guided left upper lobectomy and mediastinal LN dissection 2016, neoadjuvant systemic chemotherapy with carboplatin and paclitaxel, CAD  CAD: Seen on CT chest. He is asymptomatic at this time. Recommend aggressive medical management.  Recommend Aspirin 81 mg daily. Currently on pravastatin 80 mg. Will check lipid panel and reassess lipid lowering therapy. Needs aggressive blood pressure control. Started amlodipine 10 mg daily for now. Will check echocardiogram and AAA screening US duplex.  Tobacco cessation counseling (CPT [phone]):  - Currently smoking 1.5 packs/day   - Patient was informed of the dangers of tobacco abuse including stroke, cancer, and MI, as well as benefits of tobacco cessation. - Patient is willing to quit at this time. - Approximately 5 mins were spent counseling patient cessation techniques. We discussed various methods to help quit smoking, including deciding on a date to quit, joining a support group, pharmacological agents. Nicotine patched has caused itching, he cannot chew nicotine gum. Will start welllbutrin 100 mg bid. - I will reassess his progress at the next follow-up visit    Thank you for referring the patient to [place]. Please feel free to contact with any questions.  Nigel Mormon, MD [facility] Pager: [phone] Office: [phone] If no answer Cell [phone]     Patient referred by [name], NP for CAD  Subjective:   Larry [name], male    DOB: July 14, 1950, 69 y.o.   MRN: [id number]   No chief complaint on file.    HPI  69 y/o [place] American male with hyperlipidemia, COPD, h/o Stage IIA (T1a, N1, M0) non-small cell lung cancer, squamous cell carcinoma s/pVATS guided left upper lobectomy and mediastinal LN dissection 2016, neoadjuvant systemic chemotherapy with carboplatin and paclitaxel, now referred for management of coronary artery disease.   Recent CT chest showed aortic atherosclerosis and coronary artery atherosclerosis. Patient lives alone, does not work. He walks about a mile once a week or so. He denies chest pain, shortness of breath, palpitations, leg edema, orthopnea, PND, TIA/syncope. Unfortunately, he continues to smoke 1.5  pack/day.   Past Medical History:  Diagnosis Date  . 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 Cataract   . Chronic cough   . COPD (chronic obstructive pulmonary disease) ([place])   . Dyspnea on exertion   . Full dentures   . Hx of adenomatous colonic polyps 06/07/2018  . Hyperlipidemia   . Non-small cell carcinoma of left lung [facility]) oncologist-- dr Julien Nordmann   dx Nov 2015left upper lobe, 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, N1, M0)  s/p  left upper lobectomy with node dissection and neoadjuvant systemic chemotherapy  . Productive cough    intermittantly  . Prostate cancer [facility]) urologist-- dr wrenn/  oncologsit-  dr Tammi Klippel   Stage T1c , Gleason 4+3, PSA 4.1, vol 26.73cc--  External RXT complete and Radiactive seed implants on 04-03-2015  . 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 BRONCHOSCOPY Right 07/26/2019   VIDEO BRONCHOSCOPY WITH ENDOBRONCHIAL NAVIGATION (N/A )   . COLONOSCOPY  12/ 2009  . ENDOBRONCHIAL ULTRASOUND Bilateral 04/08/2014   Procedure: ENDOBRONCHIAL ULTRASOUND;  Surgeon: Kathee Delton, MD;  Location: [facility];  Service: Cardiopulmonary;  Laterality: Bilateral;  . INTERCOSTAL NERVE BLOCK Right 07/26/2019   Procedure: Intercostal Nerve Block;  Surgeon: Melrose Nakayama, MD;  Location: [place];  Service: Thoracic;  Laterality: Right;  . NODE DISSECTION Right 07/26/2019   Procedure: Node Dissection;  Surgeon: Melrose Nakayama, MD;  Location: [place];  Service: Thoracic;  Laterality: Right;  . RADIOACTIVE SEED IMPLANT N/A 04/03/2015   Procedure: RADIOACTIVE SEED IMPLANT/BRACHYTHERAPY IMPLANT;  Surgeon: Irine Seal, MD;  Location: [facility];  Service: Urology;  Laterality: N/A;  . VIDEO ASSISTED THORACOSCOPY (VATS)/ LOBECTOMY Left 09/09/2014   Procedure: LEFT VIDEO ASSISTED THORACOSCOPY (VATS) with LEFT UPPER LOBECTOMY;  Surgeon: Melrose Nakayama, MD;  Location: [place];  Service: Thoracic;  Laterality: Left;  Marland Kitchen VIDEO BRONCHOSCOPY WITH ENDOBRONCHIAL NAVIGATION Right 04/24/2014   Procedure: VIDEO BRONCHOSCOPY WITH ENDOBRONCHIAL NAVIGATION;  Surgeon: Collene Gobble, MD;  Location: [place];  Service: Thoracic;  Laterality: Right;  Marland Kitchen VIDEO BRONCHOSCOPY WITH ENDOBRONCHIAL NAVIGATION N/A 07/26/2019   Procedure: VIDEO BRONCHOSCOPY WITH ENDOBRONCHIAL NAVIGATION;  Surgeon: Melrose Nakayama, MD;  Location: [place];  Service: Thoracic;  Laterality: N/A;  . VIDEO BRONCHOSCOPY WITH ENDOBRONCHIAL ULTRASOUND Right 04/24/2014   Procedure: VIDEO BRONCHOSCOPY WITH ENDOBRONCHIAL ULTRASOUND;  Surgeon: Collene Gobble, MD;  Location: [place];  Service: Thoracic;  Laterality: Right;  . WEDGE RESECTION  07/26/2019     Social History   Socioeconomic History  . Marital status: Single    Spouse name: Not on file  . Number of children: 0  . Years  of education: Not on file  . Highest education level: Not on file  Occupational History  . Occupation: unemployed  Tobacco Use  . Smoking status: Current Every Day Smoker    Packs/day: 2.00    Years: 53.00    Pack years: 106.00    Types: Cigarettes  . Smokeless tobacco: Never Used  . Tobacco comment: per patient smokes about 4 cigarettes a day 07/24/2019  Vaping Use  . Vaping Use: Never used  Substance and Sexual Activity  . Alcohol use: No    Alcohol/week: 0.0 standard drinks  . Drug use: No  . Sexual activity: Not on file  Other Topics Concern  . Not on file  Social History Narrative  . Not on file   Social Determinants of Health   Financial Resource Strain:   . Difficulty of Paying Living Expenses: Not on file  Food Insecurity:   . Worried About Charity fundraiser in the Last Year: Not on file  . Ran Out of Food in the Last Year: Not on file  Transportation Needs:   . Lack of Transportation (Medical): Not on file  . Lack of Transportation (Non-Medical): Not on file  Physical Activity:   . Days of Exercise per  Week: Not on file  . Minutes of Exercise per Session: Not on file  Stress:   . Feeling of Stress : Not on file  Social Connections:   . Frequency of Communication with Friends and Family: Not on file  . Frequency of Social Gatherings with Friends and Family: Not on file  . Attends Religious Services: Not on file  . Active Member of Clubs or Organizations: Not on file  . Attends Archivist Meetings: Not on file  . Marital Status: Not on file  Intimate Partner Violence:   . Fear of Current or Ex-Partner: Not on file  . Emotionally Abused: Not on file  . Physically Abused: Not on file  . Sexually Abused: Not on file     Family History  Problem Relation Age of Onset  . Heart attack Mother   . Diabetes Mother   . Heart attack Father   . Heart failure Father   . Cancer Brother   . Diabetes Sister   . Colon cancer Neg Hx   . Esophageal cancer Neg Hx   . Rectal cancer Neg Hx   . Stomach cancer Neg Hx      Current Outpatient Medications on File Prior to Visit  Medication Sig Dispense Refill  . acetaminophen (TYLENOL) 325 MG tablet Take 2 tablets (650 mg total) by mouth every 4 (four) hours as needed for mild pain. 100 tablet 2  . albuterol (PROVENTIL HFA;VENTOLIN HFA) 108 (90 Base) MCG/ACT inhaler Inhale 1-2 puffs into the lungs every 6 (six) hours as needed for wheezing or shortness of breath.    Marland Kitchen amLODipine (NORVASC) 10 MG tablet Take 1 tablet (10 mg total) by mouth daily. 90 tablet 1  . aspirin 81 MG chewable tablet Chew 81 mg by mouth daily.     Marland Kitchen buPROPion (WELLBUTRIN SR) 100 MG 12 hr tablet TAKE 1 TABLET BY MOUTH TWICE A DAY 180 tablet 2  . metoprolol succinate (TOPROL-XL) 25 MG 24 hr tablet TAKE 1 TABLET BY MOUTH EVERY DAY 90 tablet 1  . rosuvastatin (CRESTOR) 40 MG tablet Take 1 tablet (40 mg total) by mouth daily. 30 tablet 5  . SYMBICORT 160-4.5 MCG/ACT inhaler INHALE 2 PUFFS INTO THE LUNGS 2 TIMES DAILY 10.2  Inhaler 11  . Tiotropium Bromide-Olodaterol (STIOLTO  RESPIMAT) 2.5-2.5 MCG/ACT AERS Inhale 2 puffs into the lungs daily. 8 g 0  . traMADol (ULTRAM) 50 MG tablet Take 1 tablet (50 mg total) by mouth every 6 (six) hours as needed (mild pain). 30 tablet 0   No current facility-administered medications on file prior to visit.    Cardiovascular studies:  Abdominal Aortic Duplex  03/20/2019: The maximum aorta (sac) diameter is 2.79 cm (dist). Moderate plaque observed in the distal aorta. Mild plaque observed in the proximal and mid aorta. Normal flow velocities noted in the abdominal aorta and internal iliac arteries bilaterally.  An abdominal aortic aneurysm measuring 2.71 x 2.73 x 2.79 cm is seen in the distal aorta. Recheck in 5 years for stability.   EKG 03/02/2019: Sinus rhythm 80 bpm. Old anteroseptal infarct.   CT chest 11/30/2018: 1. Status post left upper lobectomy, without recurrent or metastatic disease. 2. Mixed attenuation right middle lobe pulmonary nodule is similar to on the prior exam. Recommend attention on follow-up. 3. Other areas of pulmonary nodularity are favored to represent foci of scarring, and are not significantly changed. 4. Aortic atherosclerosis (ICD10-I70.0), coronary artery atherosclerosis and emphysema (ICD10-J43.9).  Recent labs: 12/17/2019: Glucose 111, BUN/Cr 11/0.85. EGFR >60. Na/K 139/3.9. Rest of the CMP normal H/H 11.5/35. MCV 72. Platelets 263   Review of Systems  Cardiovascular: Negative for chest pain, dyspnea on exertion, leg swelling, palpitations and syncope.  Respiratory: Positive for shortness of breath.         Vitals:   01/21/20 1356  BP: (!) 148/74  Pulse: (!) 101  Resp: 17  SpO2: 98%     Body mass index is 27.53 kg/m. Filed Weights   01/21/20 1356  Weight: 203 lb (92.1 kg)     Objective:   Physical Exam Vitals and nursing note reviewed.  Constitutional:      General: He is not in acute distress. Neck:     Vascular: No JVD.  Cardiovascular:     Rate and Rhythm:  Normal rate and regular rhythm.     Pulses:          Femoral pulses are 2+ on the right side and 2+ on the left side.      Dorsalis pedis pulses are 1+ on the right side and 1+ on the left side.       Posterior tibial pulses are 0 on the right side and 0 on the left side.     Heart sounds: Normal heart sounds. No murmur heard.   Pulmonary:     Effort: Pulmonary effort is normal.     Breath sounds: Normal breath sounds. No wheezing or rales.           Assessment & Recommendations:   69 y/o [place] male with hypertension, hyperlipidemia, COPD, IIb non-small cell carcinoma (2016), stage Ia non-small cell carcinomas (2021), CAD noted on CT chest.  CAD: Seen on CT chest. He is asymptomatic at this time. Recommend aggressive medical management.  Continue Aspirin 81 mg, Crestor 40 mg daily. Check lipid panel  Hypertension: Uncontrolled. Increased metoprolol succinate to 50 mg daily.   AAA: <3 cm. Repeat aorta duplex in 2025.  Tobacco cessation counseling: Tobacco cessation counseling:  - Currently smoking "every now and then" according to the patient. He wouldn't quantify it.  packs/day   - Patient was informed of the dangers of tobacco abuse including stroke, cancer, and MI, as well as benefits of tobacco cessation. - Patient  is willing to quit at this time. - Approximately 5 mins were spent counseling patient cessation techniques. We discussed various methods to help quit smoking, including deciding on a date to quit, joining a support group, pharmacological agents. Patient stated, "I am good", and does not want to consider any pharmacotherapy for cessation at this time.  - I will reassess his progress at the next follow-up visit  F/u in 4 weeks for hypertension, hyperlipidemia management.   Nigel Mormon, MD Pager: [phone] Office: [phone]

## 2020-02-20 NOTE — Progress Notes (Signed)
Patient referred by [name], NP for CAD  Subjective:   Larry [name], male    DOB: [birth year], 69 y.o.   MRN: [id number]   Chief Complaint  Patient presents with  . Hypertension  . Coronary Artery Disease  . Follow-up    4 week     HPI  69 y/o [place] male with hypertension, hyperlipidemia, COPD, IIb non-small cell carcinoma (2016), stage Ia non-small cell carcinomas (2021), CAD noted on CT chest.  Patient does not have any angina symptoms. Unfortunately, he still smokes "every now and then". Blood pressure is better controlled today.    Current Outpatient Medications on File Prior to Visit  Medication Sig Dispense Refill  . albuterol (PROVENTIL HFA;VENTOLIN HFA) 108 (90 Base) MCG/ACT inhaler Inhale 1-2 puffs into the lungs every 6 (six) hours as needed for wheezing or shortness of breath.    Marland Kitchen amLODipine (NORVASC) 10 MG tablet Take 1 tablet (10 mg total) by mouth daily. 90 tablet 1  . aspirin 81 MG chewable tablet Chew 81 mg by mouth daily.     Marland Kitchen buPROPion (WELLBUTRIN SR) 100 MG 12 hr tablet TAKE 1 TABLET BY MOUTH TWICE A DAY 180 tablet 2  . metoprolol succinate (TOPROL-XL) 50 MG 24 hr tablet Take 1 tablet (50 mg total) by mouth daily. 90 tablet 3  . rosuvastatin (CRESTOR) 40 MG tablet Take 1 tablet (40 mg total) by mouth daily. 30 tablet 5  . SYMBICORT 160-4.5 MCG/ACT inhaler INHALE 2 PUFFS INTO THE LUNGS 2 TIMES DAILY 10.2 Inhaler 11  . Tiotropium Bromide-Olodaterol (STIOLTO RESPIMAT) 2.5-2.5 MCG/ACT AERS Inhale 2 puffs into the lungs daily. 8 g 0  . traMADol (ULTRAM) 50 MG tablet Take 1 tablet (50 mg total) by mouth every 6 (six) hours as needed (mild pain). 30 tablet 0   No current facility-administered medications on file prior to visit.    Cardiovascular studies:  EKG 2/232021: Sinus rhythm 65 bpm. Old anteroseptal infarct.   Echocardiogram 03/20/2019: Left ventricle cavity is normal in size. Moderate concentric hypertrophy of the left  ventricle. Normal LV systolic function with EF 55%. Normal global wall motion. Doppler evidence of grade I (impaired) diastolic dysfunction, normal LAP.  Left atrial cavity is normal in size. Aneurysmal interatrial septum without 2D or color Doppler evidence of interatrial shunt. Mild tricuspid regurgitation. Estimated pulmonary artery systolic pressure is 24 mmHg.  Abdominal Aortic Duplex  03/20/2019: The maximum aorta (sac) diameter is 2.79 cm (dist). Moderate plaque observed in the distal aorta. Mild plaque observed in the proximal and mid aorta. Normal flow velocities noted in the abdominal aorta and internal iliac arteries bilaterally.  An abdominal aortic aneurysm measuring 2.71 x 2.73 x 2.79 cm is seen in the distal aorta. Recheck in 5 years for stability.    CT chest 11/30/2018: 1. Status post left upper lobectomy, without recurrent or metastatic disease. 2. Mixed attenuation right middle lobe pulmonary nodule is similar to on the prior exam. Recommend attention on follow-up. 3. Other areas of pulmonary nodularity are favored to represent foci of scarring, and are not significantly changed. 4. Aortic atherosclerosis (ICD10-I70.0), coronary artery atherosclerosis and emphysema (ICD10-J43.9).  Recent labs: 12/17/2019: Glucose 111, BUN/Cr 11/0.85. EGFR >60. Na/K 139/3.9. Rest of the CMP normal H/H 11.5/35. MCV 72. Platelets 263  03/2019: Chol 215, TG 144, HDL 47, LDL 142    Review of Systems  Constitution: Negative for decreased appetite, malaise/fatigue, weight gain and weight loss.  HENT: Negative for congestion.  Eyes: Negative for visual disturbance.  Cardiovascular: Negative for chest pain, dyspnea on exertion, leg swelling, palpitations and syncope.  Respiratory: Positive for shortness of breath (Only when he smokes). Negative for cough.   Endocrine: Negative for cold intolerance.  Hematologic/Lymphatic: Does not bruise/bleed easily.  Skin: Negative for itching and rash.   Musculoskeletal: Negative for myalgias.  Gastrointestinal: Negative for abdominal pain, nausea and vomiting.  Genitourinary: Negative for dysuria.  Neurological: Negative for dizziness and weakness.  Psychiatric/Behavioral: The patient is not nervous/anxious.   All other systems reviewed and are negative.       Vitals:   02/21/20 0932  BP: 132/71  Pulse: 86  Resp: 17  SpO2: 95%     Body mass index is 27.26 kg/m. Filed Weights   02/21/20 0932  Weight: 201 lb (91.2 kg)     Objective:   Physical Exam  Constitutional: He is oriented to person, place, and time. He appears well-developed and well-nourished. No distress.  HENT:  Head: Normocephalic and atraumatic.  Eyes: Pupils are equal, round, and reactive to light. Conjunctivae are normal.  Neck: No JVD present.  Cardiovascular: Normal rate and regular rhythm.  No murmur heard. Pulses:      Femoral pulses are 2+ on the right side and 2+ on the left side.      Dorsalis pedis pulses are 1+ on the right side and 1+ on the left side.       Posterior tibial pulses are 0 on the right side and 0 on the left side.  Pulmonary/Chest: Effort normal and breath sounds normal. He has no wheezes. He has no rales.  Abdominal: Soft. Bowel sounds are normal. There is no rebound.  Musculoskeletal:        General: No edema.  Lymphadenopathy:    He has no cervical adenopathy.  Neurological: He is alert and oriented to person, place, and time. No cranial nerve deficit.  Skin: Skin is warm and dry.  Psychiatric: He has a normal mood and affect.  Nursing note and vitals reviewed.       Assessment & Recommendations:   69 y/o [place] American male with hypertension, hyperlipidemia, COPD, IIb non-small cell carcinoma (2016), stage Ia non-small cell carcinomas (2021), CAD noted on CT chest.  CAD: Seen on CT chest. He is asymptomatic at this time. Recommend aggressive medical management.  Continue Aspirin 81 mg, Crestor 40 mg  daily. Check lipid panel  Hypertension: Well controlled.   AAA: <3 cm. Repeat aorta duplex in 2025.  Tobacco cessation counseling: Tobacco cessation counseling:  - Currently smoking "every now and then" according to the patient. He wouldn't quantify it.  packs/day   - Patient was informed of the dangers of tobacco abuse including stroke, cancer, and MI, as well as benefits of tobacco cessation. - Patient is willing to quit at this time. - Approximately 5 mins were spent counseling patient cessation techniques. We discussed various methods to help quit smoking, including deciding on a date to quit, joining a support group, pharmacological agents. Patient stated, "I am good", and does not want to consider any pharmacotherapy for cessation at this time.  - I will reassess his progress at the next follow-up visit  F/u in 4 weeks for hypertension, hyperlipidemia management.   Nigel Mormon, MD Pager: [phone] Office: [phone]

## 2020-02-21 ENCOUNTER — Other Ambulatory Visit: Payer: Self-pay

## 2020-02-21 ENCOUNTER — Ambulatory Visit: Payer: Medicare Other | Admitting: Cardiology

## 2020-02-21 ENCOUNTER — Encounter: Payer: Self-pay | Admitting: Cardiology

## 2020-02-21 VITALS — BP 132/71 | HR 86 | Resp 17 | Ht 72.0 in | Wt 201.0 lb

## 2020-02-21 DIAGNOSIS — I2510 Atherosclerotic heart disease of native coronary artery without angina pectoris: Secondary | ICD-10-CM

## 2020-07-14 ENCOUNTER — Other Ambulatory Visit: Payer: Self-pay | Admitting: Thoracic Surgery (Cardiothoracic Vascular Surgery)

## 2020-07-14 DIAGNOSIS — C3492 Malignant neoplasm of unspecified part of left bronchus or lung: Secondary | ICD-10-CM

## 2020-07-15 ENCOUNTER — Ambulatory Visit (INDEPENDENT_AMBULATORY_CARE_PROVIDER_SITE_OTHER): Payer: Medicare HMO | Admitting: Thoracic Surgery (Cardiothoracic Vascular Surgery)

## 2020-07-15 ENCOUNTER — Ambulatory Visit
Admission: RE | Admit: 2020-07-15 | Discharge: 2020-07-15 | Disposition: A | Discharge status: Home / Self Care | Payer: Medicare HMO | Source: Ambulatory Visit | Attending: Thoracic Surgery (Cardiothoracic Vascular Surgery) | Admitting: Thoracic Surgery (Cardiothoracic Vascular Surgery)

## 2020-07-15 ENCOUNTER — Encounter: Payer: Self-pay | Admitting: Thoracic Surgery (Cardiothoracic Vascular Surgery)

## 2020-07-15 ENCOUNTER — Other Ambulatory Visit: Payer: Self-pay

## 2020-07-15 VITALS — BP 111/72 | HR 85 | Resp 20 | Ht 72.0 in | Wt 216.0 lb

## 2020-07-15 DIAGNOSIS — C3492 Malignant neoplasm of unspecified part of left bronchus or lung: Secondary | ICD-10-CM

## 2020-07-15 NOTE — Progress Notes (Signed)
[address]       [place] [address]             [phone]     HPI: Larry [name] returns for a scheduled follow-up visit after resection of synchronous lung cancers.  Larry [name] is a 70 year old man with a history of tobacco abuse, COPD, and multiple lung cancers.  He had a stage IIb non-small cell carcinoma in 2016.  It was treated with neoadjuvant therapy followed by lobectomy.  Then in February 2021 I did a right lower lobe superior segmentectomy and middle lobe wedge resection for 2 separate primaries.  One was a squamous cell carcinoma (lower lobe) and the other was an adenocarcinoma (middle lobe).  These were both stage Ia synchronous primaries.  He did not require any adjuvant therapy.  He saw Dr. Julien Nordmann in July.  His CT at that time was without any evidence of recurrent disease.  He says that he quit smoking 2 weeks ago.  He had been down to about half a pack of cigarettes daily.  He has been using chewing gum to help.  He does have occasional wheezing.  He has not been using his inhaler on a regular basis.  Past Medical History:  Diagnosis Date  . 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 Cataract   . Chronic cough   . COPD (chronic obstructive pulmonary disease) ([place])   . Dyspnea on exertion   . Full dentures   . Hx of adenomatous colonic polyps 06/07/2018  . Hyperlipidemia   . Non-small cell carcinoma of left lung [facility]) oncologist-- dr Julien Nordmann   dx Nov 2015left upper lobe, 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, N1, M0)  s/p  left upper lobectomy with node dissection and neoadjuvant systemic chemotherapy  . Productive cough    intermittantly  . Prostate cancer [facility]) urologist-- dr wrenn/  oncologsit-  dr Tammi Klippel   Stage T1c , Gleason 4+3, PSA 4.1, vol 26.73cc--  External RXT complete and Radiactive seed implants on 04-03-2015  . 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 albuterol (PROVENTIL HFA;VENTOLIN HFA) 108 (90 Base) MCG/ACT inhaler Inhale 1-2 puffs into the lungs every 6 (six) hours as needed for wheezing or shortness of breath.    Marland Kitchen amLODipine (NORVASC) 10 MG tablet Take 1 tablet (10 mg total) by mouth daily. 90 tablet 1  . aspirin 81 MG chewable tablet Chew 81 mg by mouth daily.     Marland Kitchen buPROPion (WELLBUTRIN SR) 100 MG 12 hr tablet TAKE 1 TABLET BY MOUTH TWICE A DAY 180 tablet 2  . metFORMIN (GLUCOPHAGE) 500 MG tablet metformin 500 mg tablet  Take 1 tablet every day by oral route.    . metoprolol succinate (TOPROL-XL) 50 MG 24 hr tablet Take 1 tablet (50 mg total) by mouth daily. 90 tablet 3  . rosuvastatin (CRESTOR) 40 MG tablet Take 1 tablet (40 mg total) by mouth daily. 30 tablet 5  . SYMBICORT 160-4.5 MCG/ACT inhaler INHALE 2 PUFFS INTO THE LUNGS 2 TIMES DAILY 10.2 Inhaler 11  . Tiotropium Bromide-Olodaterol (STIOLTO RESPIMAT) 2.5-2.5 MCG/ACT AERS Inhale 2 puffs into the lungs daily. 8 g 0  . traMADol (ULTRAM) 50 MG tablet Take 1 tablet (50 mg total) by mouth every 6 (six) hours as needed (mild pain). 30 tablet 0   No current facility-administered medications for this  visit.    Physical Exam BP 111/72   Pulse 85   Resp 20   Ht 6' (1.829 m)   Wt 216 lb (98 kg)   SpO2 93% Comment: RA  BMI 29.84 kg/m  70 year old man in no acute distress Alert and oriented x3 with no focal deficits No cervical or supraclavicular adenopathy Cardiac regular rate and rhythm Lungs with wheezes at both bases  Diagnostic Tests: CHEST - 2 VIEW  COMPARISON:  11/13/2019  FINDINGS: The heart size and mediastinal contours are within normal limits. Redemonstrated postoperative findings of the right midlung. Emphysema. No acute airspace opacity. The visualized skeletal structures are unremarkable.  IMPRESSION: 1. Emphysema. No acute abnormality of the lungs. 2. Redemonstrated postoperative findings of the right  midlung.   Electronically Signed   By: Eddie Candle M.D.   On: 07/15/2020 09:30 I personally reviewed the chest x-ray images.  There are COPD changes and postoperative changes on both sides.  No evidence of recurrent disease.  Impression: Larry [name] is a 70 year old man with a history of tobacco abuse, COPD, and multiple lung cancers.  He had a stage IIb non-small cell carcinoma in 2016.  It was treated with neoadjuvant therapy followed by lobectomy.  Then in February 2021 I did a right lower lobe superior segmentectomy and middle lobe wedge resection for 2 separate primaries.  One was a squamous cell carcinoma (lower lobe) and the other was an adenocarcinoma (middle lobe).  These were both stage Ia synchronous primaries.  He did not require any adjuvant therapy.  He does not have any pain or other residual effects related to his surgery.  Stage IIb non-small cell carcinoma 2016-neoadjuvant therapy followed by lobectomy.  No evidence of recurrence.  Synchronous stage Ia non-small cell carcinomas in 2021-limited resections on both.  No evidence of recurrent disease.  Tobacco abuse-I congratulated him on finally stopping smoking.  He has not smoked in 2 weeks.  I encouraged him to continue with that.  COPD-some mild wheezing on exam.  On Stiolto, Symbicort, and albuterol as needed.  Plan: Follow-up as scheduled with Dr. Julien Nordmann I will be happy to see Larry [name] back anytime in the future if I can be of any further assistance with his care  Melrose Nakayama, MD [facility] [phone]

## 2020-08-20 ENCOUNTER — Ambulatory Visit: Payer: Medicare Other | Admitting: Cardiology

## 2020-09-20 ENCOUNTER — Other Ambulatory Visit: Payer: Self-pay | Admitting: Cardiology

## 2020-09-20 DIAGNOSIS — Z720 Tobacco use: Secondary | ICD-10-CM

## 2020-12-15 ENCOUNTER — Inpatient Hospital Stay: Payer: Medicare Other | Attending: Internal Medicine

## 2020-12-15 ENCOUNTER — Ambulatory Visit (HOSPITAL_COMMUNITY)
Admission: RE | Admit: 2020-12-15 | Discharge: 2020-12-15 | Disposition: A | Discharge status: Home / Self Care | Payer: Medicare Other | Source: Ambulatory Visit | Attending: Internal Medicine | Admitting: Internal Medicine

## 2020-12-15 ENCOUNTER — Other Ambulatory Visit: Payer: Self-pay

## 2020-12-15 ENCOUNTER — Encounter (HOSPITAL_COMMUNITY): Payer: Self-pay

## 2020-12-15 DIAGNOSIS — Z85110 Personal history of malignant carcinoid tumor of bronchus and lung: Secondary | ICD-10-CM | POA: Insufficient documentation

## 2020-12-15 DIAGNOSIS — F17210 Nicotine dependence, cigarettes, uncomplicated: Secondary | ICD-10-CM | POA: Insufficient documentation

## 2020-12-15 DIAGNOSIS — C3490 Malignant neoplasm of unspecified part of unspecified bronchus or lung: Secondary | ICD-10-CM | POA: Diagnosis not present

## 2020-12-15 DIAGNOSIS — Z79899 Other long term (current) drug therapy: Secondary | ICD-10-CM | POA: Diagnosis not present

## 2020-12-15 DIAGNOSIS — D649 Anemia, unspecified: Secondary | ICD-10-CM | POA: Diagnosis not present

## 2020-12-15 LAB — CBC WITH DIFFERENTIAL (CANCER CENTER ONLY)
Abs Immature Granulocytes: 0.03 10*3/uL (ref 0.00–0.07)
Basophils Absolute: 0.1 10*3/uL (ref 0.0–0.1)
Basophils Relative: 1 %
Eosinophils Absolute: 0.3 10*3/uL (ref 0.0–0.5)
Eosinophils Relative: 3 %
HCT: 37.6 % — ABNORMAL LOW (ref 39.0–52.0)
Hemoglobin: 12.8 g/dL — ABNORMAL LOW (ref 13.0–17.0)
Immature Granulocytes: 0 %
Lymphocytes Relative: 30 %
Lymphs Abs: 2.5 10*3/uL (ref 0.7–4.0)
MCH: 26.1 pg (ref 26.0–34.0)
MCHC: 34 g/dL (ref 30.0–36.0)
MCV: 76.6 fL — ABNORMAL LOW (ref 80.0–100.0)
Monocytes Absolute: 0.8 10*3/uL (ref 0.1–1.0)
Monocytes Relative: 10 %
Neutro Abs: 4.6 10*3/uL (ref 1.7–7.7)
Neutrophils Relative %: 56 %
Platelet Count: 308 10*3/uL (ref 150–400)
RBC: 4.91 MIL/uL (ref 4.22–5.81)
RDW: 14.7 % (ref 11.5–15.5)
WBC Count: 8.3 10*3/uL (ref 4.0–10.5)
nRBC: 0 % (ref 0.0–0.2)

## 2020-12-15 LAB — CMP (CANCER CENTER ONLY)
ALT: 19 U/L (ref 0–44)
AST: 19 U/L (ref 15–41)
Albumin: 3.7 g/dL (ref 3.5–5.0)
Alkaline Phosphatase: 80 U/L (ref 38–126)
Anion gap: 11 (ref 5–15)
BUN: 9 mg/dL (ref 8–23)
CO2: 24 mmol/L (ref 22–32)
Calcium: 9.6 mg/dL (ref 8.9–10.3)
Chloride: 105 mmol/L (ref 98–111)
Creatinine: 0.83 mg/dL (ref 0.61–1.24)
GFR, Estimated: >60 mL/min (ref >60)
Glucose, Bld: 107 mg/dL — ABNORMAL HIGH (ref 70–99)
Potassium: 4 mmol/L (ref 3.5–5.1)
Sodium: 140 mmol/L (ref 135–145)
Total Bilirubin: 0.3 mg/dL (ref 0.3–1.2)
Total Protein: 8.1 g/dL (ref 6.5–8.1)

## 2020-12-15 MED ORDER — IOHEXOL 350 MG/ML SOLN
100.0000 mL | Freq: Once | INTRAVENOUS | Status: AC | PRN
  Administered 2020-12-15: 60 mL via INTRAVENOUS

## 2020-12-17 ENCOUNTER — Other Ambulatory Visit: Payer: Self-pay

## 2020-12-17 ENCOUNTER — Encounter: Payer: Self-pay | Admitting: Internal Medicine

## 2020-12-17 ENCOUNTER — Inpatient Hospital Stay (HOSPITAL_BASED_OUTPATIENT_CLINIC_OR_DEPARTMENT_OTHER): Payer: Medicare Other | Admitting: Internal Medicine

## 2020-12-17 VITALS — BP 125/70 | HR 87 | Temp 97.9°F | Resp 20 | Ht 72.0 in | Wt 200.6 lb

## 2020-12-17 DIAGNOSIS — Z85110 Personal history of malignant carcinoid tumor of bronchus and lung: Secondary | ICD-10-CM | POA: Diagnosis not present

## 2020-12-17 DIAGNOSIS — C3490 Malignant neoplasm of unspecified part of unspecified bronchus or lung: Secondary | ICD-10-CM | POA: Diagnosis not present

## 2020-12-17 DIAGNOSIS — C3492 Malignant neoplasm of unspecified part of left bronchus or lung: Secondary | ICD-10-CM | POA: Diagnosis not present

## 2020-12-17 NOTE — Progress Notes (Signed)
[place] [phone]   [phone]  OFFICE PROGRESS NOTE  [name], [place] [address]  DIAGNOSIS:  1) Stage IIA (T1a, N1, M0) non-small cell lung cancer, squamous cell carcinoma diagnosed in November 2015, presented with left suprahilar soft tissue mass. 2) history of prostate adenocarcinoma  PRIOR THERAPY:  1) Neoadjuvant systemic chemotherapy with carboplatin for AUC of 5 and paclitaxel 175 MG/M2 every 3 weeks with Neulasta support. First dose 06/13/2014. Status post 3 cycle. 2) Status post Left video-assisted thoracoscopy, Thoracoscopic left upper lobectomy, Mediastinal lymph node dissection under the care of Dr. Roxan Hockey on 09/09/2014. 3) right lower lobe superior segmentectomy as well as right middle lobe wedge resection under the care of Dr. Roxan Hockey in February 2021.  CURRENT THERAPY: Observation.  INTERVAL HISTORY: Larry [name] 70 y.o. male returns to the clinic today for annual follow-up visit.  The patient is feeling fine today with no concerning complaints except for mild cough.  He denied having any chest pain, shortness of breath or hemoptysis.  He denied having any nausea, vomiting, diarrhea or constipation.  He has no headache or visual changes.  He continues to smoke 0.5 pack of cigarette every day.  The patient had repeat CT scan of the chest performed recently and is here for evaluation and discussion of his scan results.  MEDICAL HISTORY: Past Medical History:  Diagnosis Date   Angiodysplasia of cecum 06/01/2018   Cataract    Chronic cough    COPD (chronic obstructive pulmonary disease) ([facility])    Dyspnea on exertion    Full dentures    Hx of adenomatous colonic polyps 06/07/2018   Hyperlipidemia    Non-small cell carcinoma of left lung [facility]) oncologist-- dr Julien Nordmann   dx Nov 2015left upper lobe, 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, N1, M0)  s/p  left upper lobectomy with node  dissection and neoadjuvant systemic chemotherapy   Productive cough    intermittantly   Prostate cancer [facility]) urologist-- dr wrenn/  oncologsit-  dr Tammi Klippel   Stage T1c , Gleason 4+3, PSA 4.1, vol 26.73cc--  External RXT complete and Radiactive seed implants on 04-03-2015   Stage II squamous cell carcinoma of left lung ([place]) 05/22/2014        ALLERGIES:  has No Known Allergies.  MEDICATIONS:  Current Outpatient Medications  Medication Sig Dispense Refill   amLODipine (NORVASC) 10 MG tablet Take 1 tablet (10 mg total) by mouth daily. 90 tablet 1   aspirin 81 MG chewable tablet Chew 81 mg by mouth daily.      buPROPion (WELLBUTRIN SR) 100 MG 12 hr tablet TAKE 1 TABLET BY MOUTH TWICE A DAY 180 tablet 2   metFORMIN (GLUCOPHAGE) 500 MG tablet metformin 500 mg tablet  Take 1 tablet every day by oral route.     metoprolol succinate (TOPROL-XL) 50 MG 24 hr tablet Take 1 tablet (50 mg total) by mouth daily. 90 tablet 3   rosuvastatin (CRESTOR) 40 MG tablet Take 1 tablet (40 mg total) by mouth daily. 30 tablet 5   Tiotropium Bromide-Olodaterol (STIOLTO RESPIMAT) 2.5-2.5 MCG/ACT AERS Inhale 2 puffs into the lungs daily. 8 g 0   No current facility-administered medications for this visit.    SURGICAL HISTORY:  Past Surgical History:  Procedure Laterality Date   BRONCHOSCOPY Right 07/26/2019   VIDEO BRONCHOSCOPY WITH ENDOBRONCHIAL NAVIGATION (N/A )    COLONOSCOPY  12/ 2009   ENDOBRONCHIAL ULTRASOUND Bilateral  04/08/2014   Procedure: ENDOBRONCHIAL ULTRASOUND;  Surgeon: Kathee Delton, MD;  Location: [facility];  Service: Cardiopulmonary;  Laterality: Bilateral;   INTERCOSTAL NERVE BLOCK Right 07/26/2019   Procedure: Intercostal Nerve Block;  Surgeon: Melrose Nakayama, MD;  Location: [place];  Service: Thoracic;  Laterality: Right;   NODE DISSECTION Right 07/26/2019   Procedure: Node Dissection;  Surgeon: Melrose Nakayama, MD;  Location: [place];  Service: Thoracic;  Laterality: Right;    RADIOACTIVE SEED IMPLANT N/A 04/03/2015   Procedure: RADIOACTIVE SEED IMPLANT/BRACHYTHERAPY IMPLANT;  Surgeon: Irine Seal, MD;  Location: [facility];  Service: Urology;  Laterality: N/A;   VIDEO ASSISTED THORACOSCOPY (VATS)/ LOBECTOMY Left 09/09/2014   Procedure: LEFT VIDEO ASSISTED THORACOSCOPY (VATS) with LEFT UPPER LOBECTOMY;  Surgeon: Melrose Nakayama, MD;  Location: [place];  Service: Thoracic;  Laterality: Left;   VIDEO BRONCHOSCOPY WITH ENDOBRONCHIAL NAVIGATION Right 04/24/2014   Procedure: VIDEO BRONCHOSCOPY WITH ENDOBRONCHIAL NAVIGATION;  Surgeon: Collene Gobble, MD;  Location: [place];  Service: Thoracic;  Laterality: Right;   VIDEO BRONCHOSCOPY WITH ENDOBRONCHIAL NAVIGATION N/A 07/26/2019   Procedure: VIDEO BRONCHOSCOPY WITH ENDOBRONCHIAL NAVIGATION;  Surgeon: Melrose Nakayama, MD;  Location: [place];  Service: Thoracic;  Laterality: N/A;   VIDEO BRONCHOSCOPY WITH ENDOBRONCHIAL ULTRASOUND Right 04/24/2014   Procedure: VIDEO BRONCHOSCOPY WITH ENDOBRONCHIAL ULTRASOUND;  Surgeon: Collene Gobble, MD;  Location: [place];  Service: Thoracic;  Laterality: Right;   WEDGE RESECTION  07/26/2019    REVIEW OF SYSTEMS:  A comprehensive review of systems was negative except for: Constitutional: positive for fatigue Respiratory: positive for cough   PHYSICAL EXAMINATION: General appearance: alert, cooperative, fatigued and no distress Head: Normocephalic, without obvious abnormality, atraumatic Neck: no adenopathy, no JVD, supple, symmetrical, trachea midline and thyroid not enlarged, symmetric, no tenderness/mass/nodules Lymph nodes: Cervical, supraclavicular, and axillary nodes normal. Resp: wheezes bilaterally Back: symmetric, no curvature. ROM normal. No CVA tenderness. Cardio: regular rate and rhythm, S1, S2 normal, no murmur, click, rub or gallop GI: soft, non-tender; bowel sounds normal; no masses,  no organomegaly Extremities: extremities normal, atraumatic, no cyanosis or  edema  ECOG PERFORMANCE STATUS: 1 - Symptomatic but completely ambulatory  Blood pressure 125/70, pulse 87, temperature 97.9 F (36.6 C), temperature source Tympanic, resp. rate 20, height 6' (1.829 m), weight 200 lb 9.6 oz (91 kg), SpO2 97 %.  LABORATORY DATA: Lab Results  Component Value Date   WBC 8.3 12/15/2020   HGB 12.8 (L) 12/15/2020   HCT 37.6 (L) 12/15/2020   MCV 76.6 (L) 12/15/2020   PLT 308 12/15/2020      Chemistry      Component Value Date/Time   NA 140 12/15/2020 1101   NA 139 05/25/2017 0734   K 4.0 12/15/2020 1101   K 3.7 05/25/2017 0734   CL 105 12/15/2020 1101   CO2 24 12/15/2020 1101   CO2 23 05/25/2017 0734   BUN 9 12/15/2020 1101   BUN 10.2 05/25/2017 0734   CREATININE 0.83 12/15/2020 1101   CREATININE 0.7 05/25/2017 0734      Component Value Date/Time   CALCIUM 9.6 12/15/2020 1101   CALCIUM 9.5 05/25/2017 0734   ALKPHOS 80 12/15/2020 1101   ALKPHOS 80 05/25/2017 0734   AST 19 12/15/2020 1101   AST 18 05/25/2017 0734   ALT 19 12/15/2020 1101   ALT 17 05/25/2017 0734   BILITOT 0.3 12/15/2020 1101   BILITOT 0.29 05/25/2017 0734       RADIOGRAPHIC STUDIES: CT Chest  W Contrast  Result Date: 12/16/2020 CLINICAL DATA:  Primary Cancer Type: Lung Imaging Indication: Routine surveillance Interval therapy since last imaging? No Initial Cancer Diagnosis Date: 04/08/2014; Established by: Biopsy-proven Detailed Pathology: Stage IIA non-small cell carcinoma, squamous cell carcinoma. Primary Tumor location:  Left upper lobe. Recurrence? Yes; Date(s) of recurrence: 06/04/2019; established by: Biopsy-proven Two stage Ia synchronous primaries: Squamous cell carcinoma (lower lobe) and adenocarcinoma (middle lobe) Surgeries: Left upper lobectomy 09/09/2014. Right lower lobe superior segmentectomy and right middle lobe wedge resection 07/26/2019. Chemotherapy: Yes; Ongoing?  No; Most recent administration: 2016 Immunotherapy? No Radiation therapy?  No Other Cancer  Therapies: Prostate cancer; radiotherapy and seed implants 2016. EXAM: CT CHEST WITH CONTRAST TECHNIQUE: Multidetector CT imaging of the chest was performed during intravenous contrast administration. CONTRAST:  60mL OMNIPAQUE IOHEXOL 350 MG/ML SOLN COMPARISON:  Most recent CT chest 12/18/2019. FINDINGS: Cardiovascular: No acute findings. Aortic and coronary atherosclerotic calcification noted. Mediastinum/Nodes: No masses or pathologically enlarged lymph nodes identified. Lungs/Pleura: Postop scarring is again seen in the right mid and lower lung and remains stable. Postop changes from previous left upper lobectomy are also stable. Moderate to severe centrilobular emphysema is again demonstrated. No suspicious pulmonary nodules or masses are identified. No evidence of pulmonary infiltrate or pleural effusion. Upper Abdomen:  Unremarkable. Musculoskeletal:  No suspicious bone lesions. IMPRESSION: Stable exam.  No evidence of recurrent or metastatic carcinoma. Aortic Atherosclerosis (ICD10-I70.0) and Emphysema (ICD10-J43.9). Electronically Signed   By: Marlaine Hind M.D.   On: 12/16/2020 10:27     ASSESSMENT AND PLAN:  This is a very pleasant 70 years old African-American male with a stage IIA non-small cell lung cancer, squamous cell carcinoma status post neoadjuvant systemic chemotherapy followed by left upper lobectomy and lymph node dissection in April 2016. The patient has been on observation since that time. Recent CT scan of the chest showed enlarging spiculated nodule in the superior segment of the right lower lobe highly suspicious for recurrent adenocarcinoma.  There was also a partially solid nodule in the right middle lobe with slowly increased size also suspicious for low-grade adenocarcinoma. His PET scan showed low hypermetabolic activity in the suspicious nodule but still suspicious for slowly growing adenocarcinoma. He underwent surgical resection of the right lower lobe superior segmentectomy  as well as right middle lobe wedge resection under the care of Dr. Roxan Hockey in February 2021. The patient is currently on observation and he is feeling fine with no concerning complaints except for mild cough. He had repeat CT scan of the chest performed recently.  I personally and independently reviewed the scans and discussed the results with the patient today. His scan showed no concerning findings for disease progression or metastasis. I recommended for him to continue on observation with repeat CT scan of the chest in 1 year. I strongly advised the patient to quit smoking. For the anemia he was advised to take over-the-counter oral iron tablet few tablets weekly. The patient was advised to call immediately if he has any other concerning symptoms in the interval. The patient voices understanding of current disease status and treatment options and is in agreement with the current care plan. All questions were answered. The patient knows to call the clinic with any problems, questions or concerns. We can certainly see the patient much sooner if necessary.  Disclaimer: This note was dictated with voice recognition software. Similar sounding words can inadvertently be transcribed and may not be corrected upon review.

## 2021-06-30 ENCOUNTER — Other Ambulatory Visit: Payer: Self-pay | Admitting: Cardiology

## 2021-06-30 DIAGNOSIS — Z720 Tobacco use: Secondary | ICD-10-CM

## 2021-12-15 ENCOUNTER — Other Ambulatory Visit: Payer: Self-pay

## 2021-12-15 ENCOUNTER — Ambulatory Visit (HOSPITAL_COMMUNITY)
Admission: RE | Admit: 2021-12-15 | Discharge: 2021-12-15 | Disposition: A | Discharge status: Home / Self Care | Payer: Medicare Other | Source: Ambulatory Visit | Attending: Internal Medicine | Admitting: Internal Medicine

## 2021-12-15 ENCOUNTER — Inpatient Hospital Stay: Payer: Medicare Other | Attending: Internal Medicine

## 2021-12-15 DIAGNOSIS — C3490 Malignant neoplasm of unspecified part of unspecified bronchus or lung: Secondary | ICD-10-CM

## 2021-12-15 DIAGNOSIS — C3432 Malignant neoplasm of lower lobe, left bronchus or lung: Secondary | ICD-10-CM | POA: Insufficient documentation

## 2021-12-15 DIAGNOSIS — Z8546 Personal history of malignant neoplasm of prostate: Secondary | ICD-10-CM | POA: Insufficient documentation

## 2021-12-15 DIAGNOSIS — C342 Malignant neoplasm of middle lobe, bronchus or lung: Secondary | ICD-10-CM | POA: Insufficient documentation

## 2021-12-15 LAB — CMP (CANCER CENTER ONLY)
ALT: 21 U/L (ref 0–44)
AST: 31 U/L (ref 15–41)
Albumin: 4.4 g/dL (ref 3.5–5.0)
Alkaline Phosphatase: 69 U/L (ref 38–126)
Anion gap: 7 (ref 5–15)
BUN: 12 mg/dL (ref 8–23)
CO2: 27 mmol/L (ref 22–32)
Calcium: 9.5 mg/dL (ref 8.9–10.3)
Chloride: 103 mmol/L (ref 98–111)
Creatinine: 0.87 mg/dL (ref 0.61–1.24)
GFR, Estimated: >60 mL/min
Glucose, Bld: 117 mg/dL — ABNORMAL HIGH (ref 70–99)
Potassium: 4.1 mmol/L (ref 3.5–5.1)
Sodium: 137 mmol/L (ref 135–145)
Total Bilirubin: 0.3 mg/dL (ref 0.3–1.2)
Total Protein: 7.7 g/dL (ref 6.5–8.1)

## 2021-12-15 LAB — CBC WITH DIFFERENTIAL (CANCER CENTER ONLY)
Abs Immature Granulocytes: 0.03 10*3/uL (ref 0.00–0.07)
Basophils Absolute: 0.1 10*3/uL (ref 0.0–0.1)
Basophils Relative: 1 %
Eosinophils Absolute: 0.4 10*3/uL (ref 0.0–0.5)
Eosinophils Relative: 4 %
HCT: 38.4 % — ABNORMAL LOW (ref 39.0–52.0)
Hemoglobin: 13.4 g/dL (ref 13.0–17.0)
Immature Granulocytes: 0 %
Lymphocytes Relative: 28 %
Lymphs Abs: 2.6 10*3/uL (ref 0.7–4.0)
MCH: 27.1 pg (ref 26.0–34.0)
MCHC: 34.9 g/dL (ref 30.0–36.0)
MCV: 77.6 fL — ABNORMAL LOW (ref 80.0–100.0)
Monocytes Absolute: 1.2 10*3/uL — ABNORMAL HIGH (ref 0.1–1.0)
Monocytes Relative: 13 %
Neutro Abs: 5 10*3/uL (ref 1.7–7.7)
Neutrophils Relative %: 54 %
Platelet Count: 245 10*3/uL (ref 150–400)
RBC: 4.95 MIL/uL (ref 4.22–5.81)
RDW: 13.7 % (ref 11.5–15.5)
WBC Count: 9.2 10*3/uL (ref 4.0–10.5)
nRBC: 0 % (ref 0.0–0.2)

## 2021-12-15 MED ORDER — SODIUM CHLORIDE (PF) 0.9 % IJ SOLN
INTRAMUSCULAR | Status: AC
  Filled 2021-12-15: qty 50

## 2021-12-15 MED ORDER — IOHEXOL 300 MG/ML  SOLN
75.0000 mL | Freq: Once | INTRAMUSCULAR | Status: AC | PRN
  Administered 2021-12-15: 75 mL via INTRAVENOUS

## 2021-12-17 ENCOUNTER — Inpatient Hospital Stay (HOSPITAL_BASED_OUTPATIENT_CLINIC_OR_DEPARTMENT_OTHER): Payer: Medicare Other | Admitting: Internal Medicine

## 2021-12-17 ENCOUNTER — Other Ambulatory Visit: Payer: Self-pay

## 2021-12-17 VITALS — BP 128/67 | HR 106 | Temp 97.9°F | Resp 15 | Wt 212.3 lb

## 2021-12-17 DIAGNOSIS — C3490 Malignant neoplasm of unspecified part of unspecified bronchus or lung: Secondary | ICD-10-CM

## 2021-12-17 DIAGNOSIS — C342 Malignant neoplasm of middle lobe, bronchus or lung: Secondary | ICD-10-CM | POA: Diagnosis present

## 2021-12-17 DIAGNOSIS — Z8546 Personal history of malignant neoplasm of prostate: Secondary | ICD-10-CM | POA: Diagnosis not present

## 2021-12-17 DIAGNOSIS — C3432 Malignant neoplasm of lower lobe, left bronchus or lung: Secondary | ICD-10-CM | POA: Diagnosis not present

## 2021-12-17 DIAGNOSIS — C3492 Malignant neoplasm of unspecified part of left bronchus or lung: Secondary | ICD-10-CM

## 2021-12-17 NOTE — Progress Notes (Signed)
[name] [phone]   [phone]  OFFICE PROGRESS NOTE  [name], NP [place] [address]  DIAGNOSIS:  1) Stage IIA (T1a, N1, M0) non-small cell lung cancer, squamous cell carcinoma diagnosed in November 2015, presented with left suprahilar soft tissue mass. 2) history of prostate adenocarcinoma  PRIOR THERAPY:  1) Neoadjuvant systemic chemotherapy with carboplatin for AUC of 5 and paclitaxel 175 MG/M2 every 3 weeks with Neulasta support. First dose 06/13/2014. Status post 3 cycle. 2) Status post Left video-assisted thoracoscopy, Thoracoscopic left upper lobectomy, Mediastinal lymph node dissection under the care of Dr. Roxan Hockey on 09/09/2014. 3) right lower lobe superior segmentectomy as well as right middle lobe wedge resection under the care of Dr. Roxan Hockey in February 2021.  CURRENT THERAPY: Observation.  INTERVAL HISTORY: Larry [name] 71 y.o. male returns to the clinic today for annual follow-up visit.  The patient is feeling fine today with no concerning complaints.  He quit smoking 6 weeks ago.  He denied having any current chest pain but has mild shortness of breath with exertion with no cough or hemoptysis.  He has no nausea, vomiting, diarrhea or constipation.  He denied having any headache or visual changes.  He has no recent weight loss or night sweats.  He is here today for evaluation with repeat CT scan of the chest for restaging of his disease.  MEDICAL HISTORY: Past Medical History:  Diagnosis Date   Angiodysplasia of cecum 06/01/2018   Cataract    Chronic cough    COPD (chronic obstructive pulmonary disease) (HCC)    Dyspnea on exertion    Full dentures    Hx of adenomatous colonic polyps 06/07/2018   Hyperlipidemia    Non-small cell carcinoma of left lung [facility]) oncologist-- dr Julien Nordmann   dx Nov 2015left upper lobe, 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, N1, M0)  s/p  left upper  lobectomy with node dissection and neoadjuvant systemic chemotherapy   Productive cough    intermittantly   Prostate cancer [facility]) urologist-- dr wrenn/  oncologsit-  dr Tammi Klippel   Stage T1c , Gleason 4+3, PSA 4.1, vol 26.73cc--  External RXT complete and Radiactive seed implants on 04-03-2015   Stage II squamous cell carcinoma of left lung ([place]) 05/22/2014        ALLERGIES:  has No Known Allergies.  MEDICATIONS:  Current Outpatient Medications  Medication Sig Dispense Refill   amLODipine (NORVASC) 10 MG tablet Take 1 tablet (10 mg total) by mouth daily. 90 tablet 1   aspirin 81 MG chewable tablet Chew 81 mg by mouth daily.      buPROPion (WELLBUTRIN SR) 100 MG 12 hr tablet TAKE 1 TABLET BY MOUTH TWICE A DAY 180 tablet 2   metFORMIN (GLUCOPHAGE) 500 MG tablet metformin 500 mg tablet  Take 1 tablet every day by oral route.     metoprolol succinate (TOPROL-XL) 50 MG 24 hr tablet Take 1 tablet (50 mg total) by mouth daily. 90 tablet 3   rosuvastatin (CRESTOR) 40 MG tablet Take 1 tablet (40 mg total) by mouth daily. 30 tablet 5   Tiotropium Bromide-Olodaterol (STIOLTO RESPIMAT) 2.5-2.5 MCG/ACT AERS Inhale 2 puffs into the lungs daily. 8 g 0   No current facility-administered medications for this visit.    SURGICAL HISTORY:  Past Surgical History:  Procedure Laterality Date   BRONCHOSCOPY Right 07/26/2019   VIDEO BRONCHOSCOPY WITH ENDOBRONCHIAL NAVIGATION (N/A )    COLONOSCOPY  12/ 2009   ENDOBRONCHIAL ULTRASOUND Bilateral 04/08/2014   Procedure: ENDOBRONCHIAL ULTRASOUND;  Surgeon: Kathee Delton, MD;  Location: [facility];  Service: Cardiopulmonary;  Laterality: Bilateral;   INTERCOSTAL NERVE BLOCK Right 07/26/2019   Procedure: Intercostal Nerve Block;  Surgeon: Melrose Nakayama, MD;  Location: [place];  Service: Thoracic;  Laterality: Right;   NODE DISSECTION Right 07/26/2019   Procedure: Node Dissection;  Surgeon: Melrose Nakayama, MD;  Location: [place];  Service: Thoracic;   Laterality: Right;   RADIOACTIVE SEED IMPLANT N/A 04/03/2015   Procedure: RADIOACTIVE SEED IMPLANT/BRACHYTHERAPY IMPLANT;  Surgeon: Irine Seal, MD;  [facility];  Service: Urology;  Laterality: N/A;   VIDEO ASSISTED THORACOSCOPY (VATS)/ LOBECTOMY Left 09/09/2014   Procedure: LEFT VIDEO ASSISTED THORACOSCOPY (VATS) with LEFT UPPER LOBECTOMY;  Surgeon: Melrose Nakayama, MD;  Location: [place];  Service: Thoracic;  Laterality: Left;   VIDEO BRONCHOSCOPY WITH ENDOBRONCHIAL NAVIGATION Right 04/24/2014   Procedure: VIDEO BRONCHOSCOPY WITH ENDOBRONCHIAL NAVIGATION;  Surgeon: Collene Gobble, MD;  Location: [place];  Service: Thoracic;  Laterality: Right;   VIDEO BRONCHOSCOPY WITH ENDOBRONCHIAL NAVIGATION N/A 07/26/2019   Procedure: VIDEO BRONCHOSCOPY WITH ENDOBRONCHIAL NAVIGATION;  Surgeon: Melrose Nakayama, MD;  Location: [place];  Service: Thoracic;  Laterality: N/A;   VIDEO BRONCHOSCOPY WITH ENDOBRONCHIAL ULTRASOUND Right 04/24/2014   Procedure: VIDEO BRONCHOSCOPY WITH ENDOBRONCHIAL ULTRASOUND;  Surgeon: Collene Gobble, MD;  Location: [place];  Service: Thoracic;  Laterality: Right;   WEDGE RESECTION  07/26/2019    REVIEW OF SYSTEMS:  A comprehensive review of systems was negative except for: Respiratory: positive for dyspnea on exertion   PHYSICAL EXAMINATION: General appearance: alert, cooperative, fatigued and no distress Head: Normocephalic, without obvious abnormality, atraumatic Neck: no adenopathy, no JVD, supple, symmetrical, trachea midline and thyroid not enlarged, symmetric, no tenderness/mass/nodules Lymph nodes: Cervical, supraclavicular, and axillary nodes normal. Resp: wheezes bilaterally Back: symmetric, no curvature. ROM normal. No CVA tenderness. Cardio: regular rate and rhythm, S1, S2 normal, no murmur, click, rub or gallop GI: soft, non-tender; bowel sounds normal; no masses,  no organomegaly Extremities: extremities normal, atraumatic, no cyanosis or  edema  ECOG PERFORMANCE STATUS: 1 - Symptomatic but completely ambulatory  Blood pressure 128/67, pulse (!) 106, temperature 97.9 F (36.6 C), temperature source Temporal, resp. rate 15, weight 212 lb 4.8 oz (96.3 kg), SpO2 97 %.  LABORATORY DATA: Lab Results  Component Value Date   WBC 9.2 12/15/2021   HGB 13.4 12/15/2021   HCT 38.4 (L) 12/15/2021   MCV 77.6 (L) 12/15/2021   PLT 245 12/15/2021      Chemistry      Component Value Date/Time   NA 137 12/15/2021 1115   NA 139 05/25/2017 0734   K 4.1 12/15/2021 1115   K 3.7 05/25/2017 0734   CL 103 12/15/2021 1115   CO2 27 12/15/2021 1115   CO2 23 05/25/2017 0734   BUN 12 12/15/2021 1115   BUN 10.2 05/25/2017 0734   CREATININE 0.87 12/15/2021 1115   CREATININE 0.7 05/25/2017 0734      Component Value Date/Time   CALCIUM 9.5 12/15/2021 1115   CALCIUM 9.5 05/25/2017 0734   ALKPHOS 69 12/15/2021 1115   ALKPHOS 80 05/25/2017 0734   AST 31 12/15/2021 1115   AST 18 05/25/2017 0734   ALT 21 12/15/2021 1115   ALT 17 05/25/2017 0734   BILITOT 0.3 12/15/2021 1115   BILITOT 0.29 05/25/2017 0734       RADIOGRAPHIC STUDIES: CT  Chest W Contrast  Result Date: 12/16/2021 CLINICAL DATA:  Primary Cancer Type: Lung Imaging Indication: Routine surveillance Interval therapy since last imaging? No Initial Cancer Diagnosis Date: 04/08/2014; Established by: Biopsy-proven Detailed Pathology: Stage IIA non-small cell carcinoma, squamous cell carcinoma. Primary Tumor location:  Left upper lobe. Recurrence? Yes; date(s) of recurrence: 06/04/2019; Established by: Biopsy-proven Two stage Ia synchronous primaries: Squamous cell carcinoma (lower lobe) and adenocarcinoma (middle lobe). Surgeries: Left upper lobectomy 09/09/2014. Right lower lobe superior segmentectomy and right middle lobe wedge resection 07/26/2019. Chemotherapy: Yes; Ongoing? No; Most recent administration: 2016 Immunotherapy? No Radiation therapy?  No thoracic Other Cancer Therapies:  Prostate cancer; radiotherapy and seed implants 2016. * Tracking Code: BO * EXAM: CT CHEST WITH CONTRAST TECHNIQUE: Multidetector CT imaging of the chest was performed during intravenous contrast administration. RADIATION DOSE REDUCTION: This exam was performed according to the departmental dose-optimization program which includes automated exposure control, adjustment of the mA and/or kV according to patient size and/or use of iterative reconstruction technique. CONTRAST:  81mL OMNIPAQUE IOHEXOL 300 MG/ML  SOLN COMPARISON:  Most recent CT chest 12/15/2020.  06/18/2019 PET-CT. FINDINGS: Cardiovascular: Three-vessel coronary artery disease. Normal heart size without pericardial effusion or nodularity. Normal caliber of central pulmonary vessels. Calcified and noncalcified aortic atherosclerotic plaque. No aneurysmal dilation. Mediastinum/Nodes: Scattered small mediastinal lymph nodes without change since previous imaging largest along the RIGHT paratracheal chain approximately 1 cm short axis but with fatty hilum. No RIGHT or LEFT hilar adenopathy. Esophagus grossly normal. No thoracic inlet or axillary lymphadenopathy. No internal mammary adenopathy. Lungs/Pleura: Severe pulmonary emphysema worse at the LEFT lung apex. Signs of LEFT upper lobectomy and partial lung resection in RIGHT middle and lower lobe. No pleural effusion or nodularity. Pleural and parenchymal scarring in the RIGHT mid chest. Middle Upper Abdomen: Incidental imaging of upper abdominal contents without acute process. Smooth contour of the liver. Imaged portions of pancreas, spleen, adrenal glands and kidneys without acute findings. No upper abdominal lymphadenopathy. Musculoskeletal: No acute bone finding. No destructive bone process. Spinal degenerative changes. IMPRESSION: 1. Signs of LEFT upper lobectomy and partial lung resection in the RIGHT middle and lower lobe. No signs to indicate disease recurrence or metastasis. 2. Severe pulmonary  emphysema worse at the LEFT lung apex. 3. Three-vessel coronary artery disease and aortic atherosclerosis. Aortic Atherosclerosis (ICD10-I70.0) and Emphysema (ICD10-J43.9). Electronically Signed   By: Zetta Bills M.D.   On: 12/16/2021 11:26     ASSESSMENT AND PLAN:  This is a very pleasant 71 years old African-American male with a stage IIA non-small cell lung cancer, squamous cell carcinoma status post neoadjuvant systemic chemotherapy followed by left upper lobectomy and lymph node dissection in April 2016. The patient has been on observation since that time. Repeat restaging CT scan of the chest showed enlarging spiculated nodule in the superior segment of the right lower lobe highly suspicious for recurrent adenocarcinoma.  There was also a partially solid nodule in the right middle lobe with slowly increased size also suspicious for low-grade adenocarcinoma. His PET scan showed low hypermetabolic activity in the suspicious nodule but still suspicious for slowly growing adenocarcinoma. He underwent surgical resection of the right lower lobe superior segmentectomy as well as right middle lobe wedge resection under the care of Dr. Roxan Hockey in February 2021. The patient has been on observation since that time with no concerning adverse effects. He had repeat CT scan of the chest performed recently.  I personally and independently reviewed the scans and discussed the results with the  patient today. His scan showed no concerning findings for disease recurrence or metastasis. I recommended for him to continue on observation with repeat CT scan of the chest in 1 year. He was advised to call immediately if he has any concerning symptoms in the interval. The patient voices understanding of current disease status and treatment options and is in agreement with the current care plan. All questions were answered. The patient knows to call the clinic with any problems, questions or concerns. We can  certainly see the patient much sooner if necessary.  Disclaimer: This note was dictated with voice recognition software. Similar sounding words can inadvertently be transcribed and may not be corrected upon review.

## 2022-12-14 ENCOUNTER — Inpatient Hospital Stay: Payer: 59 | Attending: Internal Medicine

## 2022-12-14 ENCOUNTER — Other Ambulatory Visit: Payer: Self-pay

## 2022-12-14 DIAGNOSIS — Z85118 Personal history of other malignant neoplasm of bronchus and lung: Secondary | ICD-10-CM | POA: Insufficient documentation

## 2022-12-14 DIAGNOSIS — Z08 Encounter for follow-up examination after completed treatment for malignant neoplasm: Secondary | ICD-10-CM | POA: Diagnosis not present

## 2022-12-14 DIAGNOSIS — C3490 Malignant neoplasm of unspecified part of unspecified bronchus or lung: Secondary | ICD-10-CM

## 2022-12-14 LAB — CBC WITH DIFFERENTIAL (CANCER CENTER ONLY)
Abs Immature Granulocytes: 0.02 10*3/uL (ref 0.00–0.07)
Basophils Absolute: 0.1 10*3/uL (ref 0.0–0.1)
Basophils Relative: 1 %
Eosinophils Absolute: 0.3 10*3/uL (ref 0.0–0.5)
Eosinophils Relative: 3 %
HCT: 41.6 % (ref 39.0–52.0)
Hemoglobin: 14.6 g/dL (ref 13.0–17.0)
Immature Granulocytes: 0 %
Lymphocytes Relative: 27 %
Lymphs Abs: 2.2 10*3/uL (ref 0.7–4.0)
MCH: 27.9 pg (ref 26.0–34.0)
MCHC: 35.1 g/dL (ref 30.0–36.0)
MCV: 79.5 fL — ABNORMAL LOW (ref 80.0–100.0)
Monocytes Absolute: 0.8 10*3/uL (ref 0.1–1.0)
Monocytes Relative: 10 %
Neutro Abs: 4.6 10*3/uL (ref 1.7–7.7)
Neutrophils Relative %: 59 %
Platelet Count: 181 10*3/uL (ref 150–400)
RBC: 5.23 MIL/uL (ref 4.22–5.81)
RDW: 13.7 % (ref 11.5–15.5)
WBC Count: 7.9 10*3/uL (ref 4.0–10.5)
nRBC: 0 % (ref 0.0–0.2)

## 2022-12-14 LAB — CMP (CANCER CENTER ONLY)
ALT: 14 U/L (ref 0–44)
AST: 18 U/L (ref 15–41)
Albumin: 4.4 g/dL (ref 3.5–5.0)
Alkaline Phosphatase: 76 U/L (ref 38–126)
Anion gap: 6 (ref 5–15)
BUN: 6 mg/dL — ABNORMAL LOW (ref 8–23)
CO2: 29 mmol/L (ref 22–32)
Calcium: 9.6 mg/dL (ref 8.9–10.3)
Chloride: 104 mmol/L (ref 98–111)
Creatinine: 0.76 mg/dL (ref 0.61–1.24)
GFR, Estimated: >60 mL/min (ref >60)
Glucose, Bld: 102 mg/dL — ABNORMAL HIGH (ref 70–99)
Potassium: 4.5 mmol/L (ref 3.5–5.1)
Sodium: 139 mmol/L (ref 135–145)
Total Bilirubin: 0.4 mg/dL (ref 0.3–1.2)
Total Protein: 7.7 g/dL (ref 6.5–8.1)

## 2022-12-15 ENCOUNTER — Telehealth: Payer: Self-pay | Admitting: Internal Medicine

## 2022-12-15 ENCOUNTER — Telehealth: Payer: Self-pay

## 2022-12-15 NOTE — Telephone Encounter (Signed)
This nurse reached out to patient and assisted with scheduling his CT scan and follow up office visit.  This nurse made patient aware that his CT scan will be Tuesday 7/23 and to arrive at 245 pm and follow up appointment will be on 8/1 at 330 pm.  Patient is aware of appointments and is in agreement.

## 2022-12-15 NOTE — Telephone Encounter (Signed)
Rescheduled 07/18 appointment due to CT scan not being scheduled, patient is notified.

## 2022-12-16 ENCOUNTER — Inpatient Hospital Stay: Payer: 59 | Admitting: Internal Medicine

## 2022-12-21 ENCOUNTER — Ambulatory Visit (HOSPITAL_COMMUNITY)
Admission: RE | Admit: 2022-12-21 | Discharge: 2022-12-21 | Disposition: A | Discharge status: Home / Self Care | Payer: 59 | Source: Ambulatory Visit | Attending: Internal Medicine | Admitting: Internal Medicine

## 2022-12-21 DIAGNOSIS — C3490 Malignant neoplasm of unspecified part of unspecified bronchus or lung: Secondary | ICD-10-CM | POA: Diagnosis not present

## 2022-12-21 MED ORDER — IOHEXOL 300 MG/ML  SOLN
75.0000 mL | Freq: Once | INTRAMUSCULAR | Status: AC | PRN
  Administered 2022-12-21: 75 mL via INTRAVENOUS

## 2022-12-21 MED ORDER — SODIUM CHLORIDE (PF) 0.9 % IJ SOLN
INTRAMUSCULAR | Status: AC
  Filled 2022-12-21: qty 50

## 2022-12-27 NOTE — Progress Notes (Unsigned)
[facility] OFFICE PROGRESS NOTE  Charlane Ferretti, DO [address] [place] [address]  DIAGNOSIS:  1) Stage IIA (T1a, N1, M0) non-small cell lung cancer, squamous cell carcinoma diagnosed in November 2015, presented with left suprahilar soft tissue mass. 2) history of prostate adenocarcinoma  PRIOR THERAPY: 1) Neoadjuvant systemic chemotherapy with carboplatin for AUC of 5 and paclitaxel 175 MG/M2 every 3 weeks with Neulasta support. First dose 06/13/2014. Status post 3 cycle. 2) Status post Left video-assisted thoracoscopy, Thoracoscopic left upper lobectomy, Mediastinal lymph node dissection under the care of Dr. Dorris Fetch on 09/09/2014. 3) right lower lobe superior segmentectomy as well as right middle lobe wedge resection under the care of Dr. Dorris Fetch in February 2021.  CURRENT THERAPY: Observation ***  INTERVAL HISTORY: Larry [name] 72 y.o. male returns to the clinic for an annual follow-up visit.  The patient was last seen in July 2023.  He is on observation for his history of lung cancer.  He is feeling fine.  Denies any fever, chills, night sweats, or unexplained weight loss.  Denies any chest pain cough, or hemoptysis.  Dyspnea on exertion?  Quit smoking?  Denies any nausea, vomiting, diarrhea, or constipation.  Denies any headache or visual changes.  He recently had a restaging CT scan performed.  He is here today for evaluation to review his scan results. MEDICAL HISTORY: Past Medical History:  Diagnosis Date   Angiodysplasia of cecum 06/01/2018   Cataract    Chronic cough    COPD (chronic obstructive pulmonary disease) (HCC)    Dyspnea on exertion    Full dentures    Hx of adenomatous colonic polyps 06/07/2018   Hyperlipidemia    Non-small cell carcinoma of left lung [facility]) oncologist-- dr Arbutus Ped   dx Nov 2015left upper lobe, 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, N1, M0)  s/p  left upper lobectomy with node dissection and neoadjuvant systemic  chemotherapy   Productive cough    intermittantly   Prostate cancer [facility]) urologist-- dr wrenn/  oncologsit-  dr Kathrynn Running   Stage T1c , Gleason 4+3, PSA 4.1, vol 26.73cc--  External RXT complete and Radiactive seed implants on 04-03-2015   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) 05/22/2014        ALLERGIES:  has No Known Allergies.  MEDICATIONS:  Current Outpatient Medications  Medication Sig Dispense Refill   amLODipine (NORVASC) 10 MG tablet Take 1 tablet (10 mg total) by mouth daily. 90 tablet 1   aspirin 81 MG chewable tablet Chew 81 mg by mouth daily.      buPROPion (WELLBUTRIN SR) 100 MG 12 hr tablet TAKE 1 TABLET BY MOUTH TWICE A DAY 180 tablet 2   metFORMIN (GLUCOPHAGE) 500 MG tablet metformin 500 mg tablet  Take 1 tablet every day by oral route.     metoprolol succinate (TOPROL-XL) 50 MG 24 hr tablet Take 1 tablet (50 mg total) by mouth daily. 90 tablet 3   rosuvastatin (CRESTOR) 40 MG tablet Take 1 tablet (40 mg total) by mouth daily. 30 tablet 5   Tiotropium Bromide-Olodaterol (STIOLTO RESPIMAT) 2.5-2.5 MCG/ACT AERS Inhale 2 puffs into the lungs daily. 8 g 0   No current facility-administered medications for this visit.    SURGICAL HISTORY:  Past Surgical History:  Procedure Laterality Date   BRONCHOSCOPY Right 07/26/2019   VIDEO BRONCHOSCOPY WITH ENDOBRONCHIAL NAVIGATION (N/A )    COLONOSCOPY  12/ 2009   ENDOBRONCHIAL ULTRASOUND Bilateral 04/08/2014   Procedure: ENDOBRONCHIAL  ULTRASOUND;  Surgeon: Barbaraann Share, MD;  Location: [facility];  Service: Cardiopulmonary;  Laterality: Bilateral;   INTERCOSTAL NERVE BLOCK Right 07/26/2019   Procedure: Intercostal Nerve Block;  Surgeon: Loreli Slot, MD;  Location: [facility] OR;  Service: Thoracic;  Laterality: Right;   NODE DISSECTION Right 07/26/2019   Procedure: Node Dissection;  Surgeon: Loreli Slot, MD;  Location: [facility] OR;  Service: Thoracic;  Laterality: Right;   RADIOACTIVE SEED IMPLANT N/A  04/03/2015   Procedure: RADIOACTIVE SEED IMPLANT/BRACHYTHERAPY IMPLANT;  Surgeon: Bjorn Pippin, MD;  Location: [facility];  Service: Urology;  Laterality: N/A;   VIDEO ASSISTED THORACOSCOPY (VATS)/ LOBECTOMY Left 09/09/2014   Procedure: LEFT VIDEO ASSISTED THORACOSCOPY (VATS) with LEFT UPPER LOBECTOMY;  Surgeon: Loreli Slot, MD;  Location: [facility];  Service: Thoracic;  Laterality: Left;   VIDEO BRONCHOSCOPY WITH ENDOBRONCHIAL NAVIGATION Right 04/24/2014   Procedure: VIDEO BRONCHOSCOPY WITH ENDOBRONCHIAL NAVIGATION;  Surgeon: Leslye Peer, MD;  Location: [facility] OR;  Service: Thoracic;  Laterality: Right;   VIDEO BRONCHOSCOPY WITH ENDOBRONCHIAL NAVIGATION N/A 07/26/2019   Procedure: VIDEO BRONCHOSCOPY WITH ENDOBRONCHIAL NAVIGATION;  Surgeon: Loreli Slot, MD;  Location: [facility];  Service: Thoracic;  Laterality: N/A;   VIDEO BRONCHOSCOPY WITH ENDOBRONCHIAL ULTRASOUND Right 04/24/2014   Procedure: VIDEO BRONCHOSCOPY WITH ENDOBRONCHIAL ULTRASOUND;  Surgeon: Leslye Peer, MD;  Location: [facility];  Service: Thoracic;  Laterality: Right;   WEDGE RESECTION  07/26/2019    REVIEW OF SYSTEMS:   Review of Systems  Constitutional: Negative for appetite change, chills, fatigue, fever and unexpected weight change.  HENT:   Negative for mouth sores, nosebleeds, sore throat and trouble swallowing.   Eyes: Negative for eye problems and icterus.  Respiratory: Negative for cough, hemoptysis, shortness of breath and wheezing.   Cardiovascular: Negative for chest pain and leg swelling.  Gastrointestinal: Negative for abdominal pain, constipation, diarrhea, nausea and vomiting.  Genitourinary: Negative for bladder incontinence, difficulty urinating, dysuria, frequency and hematuria.   Musculoskeletal: Negative for back pain, gait problem, neck pain and neck stiffness.  Skin: Negative for itching and rash.  Neurological: Negative for dizziness, extremity weakness, gait problem, headaches,  light-headedness and seizures.  Hematological: Negative for adenopathy. Does not bruise/bleed easily.  Psychiatric/Behavioral: Negative for confusion, depression and sleep disturbance. The patient is not nervous/anxious.     PHYSICAL EXAMINATION:  There were no vitals taken for this visit.  ECOG PERFORMANCE STATUS: {CHL ONC ECOG [id number]  Physical Exam  Constitutional: Oriented to person, place, and time and well-developed, well-nourished, and in no distress. No distress.  HENT:  Head: Normocephalic and atraumatic.  Mouth/Throat: Oropharynx is clear and moist. No oropharyngeal exudate.  Eyes: Conjunctivae are normal. Right eye exhibits no discharge. Left eye exhibits no discharge. No scleral icterus.  Neck: Normal range of motion. Neck supple.  Cardiovascular: Normal rate, regular rhythm, normal heart sounds and intact distal pulses.   Pulmonary/Chest: Effort normal and breath sounds normal. No respiratory distress. No wheezes. No rales.  Abdominal: Soft. Bowel sounds are normal. Exhibits no distension and no mass. There is no tenderness.  Musculoskeletal: Normal range of motion. Exhibits no edema.  Lymphadenopathy:    No cervical adenopathy.  Neurological: Alert and oriented to person, place, and time. Exhibits normal muscle tone. Gait normal. Coordination normal.  Skin: Skin is warm and dry. No rash noted. Not diaphoretic. No erythema. No pallor.  Psychiatric: Mood, memory and judgment normal.  Vitals reviewed.  LABORATORY DATA: Lab Results  Component Value Date  WBC 7.9 12/14/2022   HGB 14.6 12/14/2022   HCT 41.6 12/14/2022   MCV 79.5 (L) 12/14/2022   PLT 181 12/14/2022      Chemistry      Component Value Date/Time   NA 139 12/14/2022 0946   NA 139 05/25/2017 0734   K 4.5 12/14/2022 0946   K 3.7 05/25/2017 0734   CL 104 12/14/2022 0946   CO2 29 12/14/2022 0946   CO2 23 05/25/2017 0734   BUN 6 (L) 12/14/2022 0946   BUN 10.2 05/25/2017 0734   CREATININE 0.76  12/14/2022 0946   CREATININE 0.7 05/25/2017 0734      Component Value Date/Time   CALCIUM 9.6 12/14/2022 0946   CALCIUM 9.5 05/25/2017 0734   ALKPHOS 76 12/14/2022 0946   ALKPHOS 80 05/25/2017 0734   AST 18 12/14/2022 0946   AST 18 05/25/2017 0734   ALT 14 12/14/2022 0946   ALT 17 05/25/2017 0734   BILITOT 0.4 12/14/2022 0946   BILITOT 0.29 05/25/2017 0734       RADIOGRAPHIC STUDIES:  No results found.   ASSESSMENT/PLAN:  Is a very pleasant 72 year old African-American male diagnosed with stage IIa non-small cell lung cancer, squamous cell carcinoma.  He was diagnosed in 2016.   He underwent neoadjuvant chemotherapy.  He then underwent left video-assisted thorascopic left upper lobectomy and mediastinal lymph node dissection in April 2016.  He then underwent a right lower lobe superior segmentectomy as well as right middle lobe wedge resection under the care of Dr. Dorris Fetch in February 2021.  He has been on observation since that time and feeling well.  The patient recently had a restaging CT scan performed.  The patient was seen with Dr. Arbutus Ped today.  Dr. Arbutus Ped personally and independently reviewed the scan results and discussed the results with the patient today.  The scan showed ***.  Recommend ***  We will see the patient back for follow-up visit in 1 year with a repeat staging CT scan at that time.  The patient was advised to call immediately if she has any concerning symptoms in the interval. The patient voices understanding of current disease status and treatment options and is in agreement with the current care plan. All questions were answered. The patient knows to call the clinic with any problems, questions or concerns. We can certainly see the patient much sooner if necessary      No orders of the defined types were placed in this encounter.    I spent {CHL ONC TIME VISIT - [id number]} counseling the patient face to face. The total time spent in  the appointment was {CHL ONC TIME VISIT - OZHYQ:[id number]}.  Monterius Rolf L Ike Maragh, PA-C 12/27/22

## 2022-12-30 ENCOUNTER — Other Ambulatory Visit: Payer: Self-pay

## 2022-12-30 ENCOUNTER — Inpatient Hospital Stay: Payer: 59 | Attending: Physician Assistant | Admitting: Physician Assistant

## 2022-12-30 VITALS — BP 114/77 | HR 94 | Temp 98.1°F | Resp 16 | Wt 184.9 lb

## 2022-12-30 DIAGNOSIS — F17210 Nicotine dependence, cigarettes, uncomplicated: Secondary | ICD-10-CM | POA: Diagnosis not present

## 2022-12-30 DIAGNOSIS — Z8546 Personal history of malignant neoplasm of prostate: Secondary | ICD-10-CM | POA: Diagnosis not present

## 2022-12-30 DIAGNOSIS — C3492 Malignant neoplasm of unspecified part of left bronchus or lung: Secondary | ICD-10-CM | POA: Diagnosis not present

## 2022-12-30 DIAGNOSIS — Z08 Encounter for follow-up examination after completed treatment for malignant neoplasm: Secondary | ICD-10-CM | POA: Insufficient documentation

## 2022-12-30 DIAGNOSIS — Z85118 Personal history of other malignant neoplasm of bronchus and lung: Secondary | ICD-10-CM | POA: Diagnosis not present

## 2023-01-13 ENCOUNTER — Ambulatory Visit (HOSPITAL_COMMUNITY)
Admission: RE | Admit: 2023-01-13 | Discharge: 2023-01-13 | Disposition: A | Discharge status: Home / Self Care | Payer: 59 | Source: Ambulatory Visit | Attending: Physician Assistant | Admitting: Physician Assistant

## 2023-01-13 DIAGNOSIS — C3492 Malignant neoplasm of unspecified part of left bronchus or lung: Secondary | ICD-10-CM | POA: Diagnosis present

## 2023-01-13 MED ORDER — SODIUM CHLORIDE (PF) 0.9 % IJ SOLN
INTRAMUSCULAR | Status: AC
  Filled 2023-01-13: qty 50

## 2023-01-13 MED ORDER — IOHEXOL 300 MG/ML  SOLN
75.0000 mL | Freq: Once | INTRAMUSCULAR | Status: AC | PRN
  Administered 2023-01-13: 75 mL via INTRAVENOUS

## 2023-01-20 ENCOUNTER — Ambulatory Visit: Payer: 59 | Admitting: Pulmonary Disease

## 2023-01-20 ENCOUNTER — Encounter: Payer: Self-pay | Admitting: Pulmonary Disease

## 2023-01-20 VITALS — BP 130/80 | HR 79 | Ht 72.0 in | Wt 188.2 lb

## 2023-01-20 DIAGNOSIS — J449 Chronic obstructive pulmonary disease, unspecified: Secondary | ICD-10-CM

## 2023-01-20 DIAGNOSIS — F17210 Nicotine dependence, cigarettes, uncomplicated: Secondary | ICD-10-CM | POA: Diagnosis not present

## 2023-01-20 LAB — CBC WITH DIFFERENTIAL/PLATELET
Basophils Absolute: 0.1 10*3/uL (ref 0.0–0.1)
Basophils Relative: 0.8 % (ref 0.0–3.0)
Eosinophils Absolute: 0.3 10*3/uL (ref 0.0–0.7)
Eosinophils Relative: 4 % (ref 0.0–5.0)
HCT: 43.9 % (ref 39.0–52.0)
Hemoglobin: 14.3 g/dL (ref 13.0–17.0)
Lymphocytes Relative: 27.5 % (ref 12.0–46.0)
Lymphs Abs: 2.1 10*3/uL (ref 0.7–4.0)
MCHC: 32.6 g/dL (ref 30.0–36.0)
MCV: 82.8 fl (ref 78.0–100.0)
Monocytes Absolute: 0.8 10*3/uL (ref 0.1–1.0)
Monocytes Relative: 9.9 % (ref 3.0–12.0)
Neutro Abs: 4.4 10*3/uL (ref 1.4–7.7)
Neutrophils Relative %: 57.8 % (ref 43.0–77.0)
Platelets: 198 10*3/uL (ref 150.0–400.0)
RBC: 5.3 Mil/uL (ref 4.22–5.81)
RDW: 14.3 % (ref 11.5–15.5)
WBC: 7.6 10*3/uL (ref 4.0–10.5)

## 2023-01-20 MED ORDER — NICOTINE 21 MG/24HR TD PT24: 21.0000 mg | MEDICATED_PATCH | Freq: Every day | TRANSDERMAL | 1 refills | Status: DC

## 2023-01-20 MED ORDER — VARENICLINE TARTRATE 0.5 MG PO TABS: 0.5000 mg | ORAL_TABLET | Freq: Two times a day (BID) | ORAL | 2 refills | Status: DC

## 2023-01-20 NOTE — Progress Notes (Signed)
Larry [name]    [id number]    Feb 17, 1951  Primary Care Physician:Skakle, Eliberto Ivory, DO  Referring Physician: Charlane Ferretti, DO [address] [place],  [place] [address]  Chief complaint: Consult for abnormal CT, COPD  HPI: 72 y.o. 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   Here for evaluation of COPD and abnormal CT Complains of dyspnea on exertion for many years.  Symptoms are mostly when she is walking and trying to exercise.  No symptoms at rest.  She has occasional cough with mucus production and wheezing.  Currently on Stiolto inhaler.  History notable for Stage IIA (T1a, N1, M0) non-small cell lung cancer, squamous cell carcinoma diagnosed in November 2015, presented with left suprahilar soft tissue mass.  He underwent left upper lobectomy and mediastinal dissection in 2016 and then right lower lobe segmentectomy and right middle lobe wedge resection by Dr. Dorris Fetch in February 2021.  Neoadjuvant chemotherapy by Dr. Arbutus Ped.  He continues close follow-up with oncology.  Pets: No pets Occupation: Retired Exposures: No mold, hot tub, Jacuzzi.  No feather pillows or comforters Smoking history: 60-pack-year smoker.  Continues to smoke 1 pack/day Travel history: No significant travel history Relevant family history: No family history of lung disease   Outpatient Encounter Medications as of 01/20/2023  Medication Sig   amLODipine (NORVASC) 10 MG tablet Take 1 tablet (10 mg total) by mouth daily.   aspirin 81 MG chewable tablet Chew 81 mg by mouth daily.     buPROPion (WELLBUTRIN SR) 100 MG 12 hr tablet TAKE 1 TABLET BY MOUTH TWICE A DAY   metFORMIN (GLUCOPHAGE) 500 MG tablet metformin 500 mg tablet  Take 1 tablet every day by oral route.   metoprolol succinate (TOPROL-XL) 50 MG 24 hr tablet Take 1 tablet (50 mg total) by mouth daily.   rosuvastatin (CRESTOR) 40 MG tablet Take 1 tablet (40 mg total) by mouth daily.   Tiotropium Bromide-Olodaterol (STIOLTO RESPIMAT) 2.5-2.5 MCG/ACT AERS Inhale 2 puffs into the lungs daily.   No facility-administered encounter medications on file as of 01/20/2023.    Allergies as of 01/20/2023   (No Known Allergies)    Past Medical History:  Diagnosis Date   Angiodysplasia of cecum 06/01/2018   Cataract    Chronic cough    COPD (chronic obstructive pulmonary disease) (HCC)    Dyspnea on exertion    Full dentures    Hx of adenomatous colonic polyps 06/07/2018   Hyperlipidemia    Non-small cell carcinoma of left lung [facility]) oncologist-- dr Arbutus Ped   dx Nov 2015left upper lobe, 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, N1, M0)  s/p  left upper lobectomy with node dissection and neoadjuvant systemic chemotherapy   Productive cough    intermittantly   Prostate cancer [facility]) urologist-- dr wrenn/  oncologsit-  dr Kathrynn Running   Stage T1c , Gleason 4+3, PSA 4.1, vol 26.73cc--  External RXT complete and Radiactive seed implants on 04-03-2015   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) 05/22/2014        Past Surgical History:  Procedure Laterality Date   BRONCHOSCOPY Right 07/26/2019  VIDEO BRONCHOSCOPY WITH ENDOBRONCHIAL NAVIGATION (N/A )    COLONOSCOPY  12/ 2009   ENDOBRONCHIAL ULTRASOUND Bilateral 04/08/2014   Procedure: ENDOBRONCHIAL ULTRASOUND;  Surgeon: Barbaraann Share, MD;  Location: [facility];  Service: Cardiopulmonary;  Laterality: Bilateral;   INTERCOSTAL NERVE BLOCK Right 07/26/2019   Procedure: Intercostal Nerve Block;  Surgeon: Loreli Slot, MD;  Location: [facility] OR;  Service:  Thoracic;  Laterality: Right;   NODE DISSECTION Right 07/26/2019   Procedure: Node Dissection;  Surgeon: Loreli Slot, MD;  Location: [facility] OR;  Service: Thoracic;  Laterality: Right;   RADIOACTIVE SEED IMPLANT N/A 04/03/2015   Procedure: RADIOACTIVE SEED IMPLANT/BRACHYTHERAPY IMPLANT;  Surgeon: Bjorn Pippin, MD;  Location: [facility];  Service: Urology;  Laterality: N/A;   VIDEO ASSISTED THORACOSCOPY (VATS)/ LOBECTOMY Left 09/09/2014   Procedure: LEFT VIDEO ASSISTED THORACOSCOPY (VATS) with LEFT UPPER LOBECTOMY;  Surgeon: Loreli Slot, MD;  Location: [facility] OR;  Service: Thoracic;  Laterality: Left;   VIDEO BRONCHOSCOPY WITH ENDOBRONCHIAL NAVIGATION Right 04/24/2014   Procedure: VIDEO BRONCHOSCOPY WITH ENDOBRONCHIAL NAVIGATION;  Surgeon: Leslye Peer, MD;  Location: [facility] OR;  Service: Thoracic;  Laterality: Right;   VIDEO BRONCHOSCOPY WITH ENDOBRONCHIAL NAVIGATION N/A 07/26/2019   Procedure: VIDEO BRONCHOSCOPY WITH ENDOBRONCHIAL NAVIGATION;  Surgeon: Loreli Slot, MD;  Location: [facility] OR;  Service: Thoracic;  Laterality: N/A;   VIDEO BRONCHOSCOPY WITH ENDOBRONCHIAL ULTRASOUND Right 04/24/2014   Procedure: VIDEO BRONCHOSCOPY WITH ENDOBRONCHIAL ULTRASOUND;  Surgeon: Leslye Peer, MD;  Location: [facility] OR;  Service: Thoracic;  Laterality: Right;   WEDGE RESECTION  07/26/2019    Family History  Problem Relation Age of Onset   Heart attack Mother    Diabetes Mother    Heart attack Father    Heart failure Father    Cancer Brother    Diabetes Sister    Colon cancer Neg Hx    Esophageal cancer Neg Hx    Rectal cancer Neg Hx    Stomach cancer Neg Hx     Social History   Socioeconomic History   Marital status: Single    Spouse name: Not on file   Number of children: 0   Years of education: Not on file   Highest education level: Not on file  Occupational History   Occupation: unemployed  Tobacco Use   Smoking status: Every Day    Current packs/day: 0.50     Average packs/day: 0.5 packs/day for 53.0 years (26.5 ttl pk-yrs)    Types: Cigarettes   Smokeless tobacco: Never   Tobacco comments:    Smokes 7 packs of cigarettes a week. 01/20/2023 Tay  Vaping Use   Vaping status: Never Used  Substance and Sexual Activity   Alcohol use: No    Alcohol/week: 0.0 standard drinks of alcohol   Drug use: No   Sexual activity: Not on file  Other Topics Concern   Not on file  Social History Narrative   Not on file   Social Determinants of Health   Financial Resource Strain: Not on file  Food Insecurity: Not on file  Transportation Needs: Not on file  Physical Activity: Not on file  Stress: Not on file  Social Connections: Unknown (10/12/2021)   [facility]   Social Network    Social Network: Not on file  Intimate Partner Violence: Unknown (09/01/2021)   Received from [facility]   HITS    Physically Hurt: Not on file    Insult or Talk Down To: Not on  file    Threaten Physical Harm: Not on file    Scream or Curse: Not on file    Review of systems: Review of Systems  Constitutional: Negative for fever and chills.  HENT: Negative.   Eyes: Negative for blurred vision.  Respiratory: as per HPI  Cardiovascular: Negative for chest pain and palpitations.  Gastrointestinal: Negative for vomiting, diarrhea, blood per rectum. Genitourinary: Negative for dysuria, urgency, frequency and hematuria.  Musculoskeletal: Negative for myalgias, back pain and joint pain.  Skin: Negative for itching and rash.  Neurological: Negative for dizziness, tremors, focal weakness, seizures and loss of consciousness.  Endo/Heme/Allergies: Negative for environmental allergies.  Psychiatric/Behavioral: Negative for depression, suicidal ideas and hallucinations.  All other systems reviewed and are negative.  Physical Exam: Blood pressure 130/80, pulse 79, height 6' (1.829 m), weight 188 lb 3.2 oz (85.4 kg), SpO2 97%. 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 I had reviewed the images personally  PFTs: 07/02/2019- Very severe obstructive airway disease  Labs: CBC 12/14/2022-WBC 7.9, eos 3%, absolute eosinophil count 237  Assessment:  COPD Currently on Stiolto inhaler with ongoing symptoms.  Will change to [facility] inhaler Check CBC with differential, IgE, alpha-1 antitrypsin levels and phenotype for baseline assessment Referred to pulmonary rehab  Schedule PFTs and follow-up in 3 months  Lung cancer S/p resection and neoadjuvant chemotherapy.  Continues follow-up with oncology  Active smoker Discussed smoking cessation in detail and he is willing to quit.  Ordered nicotine patches and Chantix  Plan/Recommendations: Change Stiolto to Trelegy Check baseline labs Referral to pulmonary rehab Smoking cessation with nicotine patches and Chantix PFTs in 3 months  Chilton Greathouse MD  Pulmonary and Critical Care 01/20/2023, 9:55 AM  CC: Charlane Ferretti, DO

## 2023-01-20 NOTE — Patient Instructions (Addendum)
I am glad you are doing well with your breathing Please work on smoking cessation Will order nicotine patches and Chantix for help with smoking cessation Stop Stiolto and start Breztri inhaler Check labs today including CBC differential, IgE, alpha-1 antitrypsin levels and phenotype Referral to Pulmonary Rehab Schedule PFTs and follow-up in 3 months.

## 2023-01-26 ENCOUNTER — Encounter (HOSPITAL_COMMUNITY): Payer: Self-pay | Admitting: Physician Assistant

## 2023-01-27 ENCOUNTER — Other Ambulatory Visit: Payer: Self-pay | Admitting: Physician Assistant

## 2023-01-27 DIAGNOSIS — C3492 Malignant neoplasm of unspecified part of left bronchus or lung: Secondary | ICD-10-CM

## 2023-02-10 LAB — IGE: IgE (Immunoglobulin E), Serum: 109 kU/L (ref <=114)

## 2023-02-10 LAB — ALPHA-1 ANTITRYPSIN PHENOTYPE: A-1 Antitrypsin, Ser: 134 mg/dL (ref 83–199)

## 2023-03-14 ENCOUNTER — Other Ambulatory Visit (HOSPITAL_BASED_OUTPATIENT_CLINIC_OR_DEPARTMENT_OTHER): Payer: Self-pay

## 2023-03-14 MED ORDER — INFLUENZA VAC A&B SURF ANT ADJ 0.5 ML IM SUSY
0.5000 mL | PREFILLED_SYRINGE | Freq: Once | INTRAMUSCULAR | 0 refills | Status: AC
  Filled 2023-03-14: qty 0.5, 1d supply, fill #0

## 2023-03-14 MED ORDER — COVID-19 MRNA VAC-TRIS(PFIZER) 30 MCG/0.3ML IM SUSY
0.3000 mL | PREFILLED_SYRINGE | Freq: Once | INTRAMUSCULAR | 0 refills | Status: AC
  Filled 2023-03-14: qty 0.3, 1d supply, fill #0

## 2023-03-22 ENCOUNTER — Telehealth (HOSPITAL_COMMUNITY): Payer: Self-pay

## 2023-03-22 NOTE — Telephone Encounter (Signed)
Called pt and he stated to call Elease Hashimoto his sister that she handles all his appointments. Elease Hashimoto is on His DPR form.   Attempted to call patricia in regards to Cardiac rehab. LM on VM

## 2023-03-22 NOTE — Telephone Encounter (Signed)
Pt insurance is active and benefits verified through [facility]  Co-pay $0, DED $240/$240 met, out of pocket $8850/$502.26 met, co-insurance 20%. no pre-authorization required. Flora H./[employer] 03/22/2023@3 :00, REF# [id number]

## 2023-04-15 ENCOUNTER — Telehealth (HOSPITAL_COMMUNITY): Payer: Self-pay

## 2023-04-15 NOTE — Telephone Encounter (Signed)
No response from pt.  Closed referral  

## 2023-04-18 ENCOUNTER — Encounter (HOSPITAL_COMMUNITY)
Admission: RE | Admit: 2023-04-18 | Discharge: 2023-04-18 | Disposition: A | Discharge status: Home / Self Care | Payer: 59 | Source: Ambulatory Visit | Attending: Pulmonary Disease | Admitting: Pulmonary Disease

## 2023-04-18 ENCOUNTER — Telehealth (HOSPITAL_COMMUNITY): Payer: Self-pay

## 2023-04-18 ENCOUNTER — Encounter (HOSPITAL_COMMUNITY): Payer: Self-pay

## 2023-04-18 VITALS — BP 130/74 | HR 100 | Ht 72.0 in | Wt 188.3 lb

## 2023-04-18 DIAGNOSIS — H269 Unspecified cataract: Secondary | ICD-10-CM | POA: Insufficient documentation

## 2023-04-18 DIAGNOSIS — J449 Chronic obstructive pulmonary disease, unspecified: Secondary | ICD-10-CM | POA: Diagnosis present

## 2023-04-18 DIAGNOSIS — Z5189 Encounter for other specified aftercare: Secondary | ICD-10-CM | POA: Diagnosis not present

## 2023-04-18 NOTE — Progress Notes (Signed)
Larry [name] 72 y.o. male  Pulmonary Rehab Orientation Note  This patient who was referred to Pulmonary Rehab by Dr. Isaiah Serge with the diagnosis of COPD 3 arrived today in Cardiac and Pulmonary Rehab. He arrived ambulatory with normal gait. He does not carry portable oxygen. Color good, skin warm and dry. Patient is oriented to time and place. Patient's medical history, psychosocial health, and medications reviewed.   Psychosocial assessment reveals patient lives with alone. Larry [name] is currently retired. Patient hobbies include watching tv and sitting on porch watching people, birds, and cars drive by . Patient reports his stress level is low. Areas of stress/anxiety include health. Patient does not exhibit signs of depression. Signs of depression include sadness and fatigue. PHQ2/9 score 0/2. Larry [name] shows good  coping skills with positive outlook on life. Offered emotional support and reassurance. Will continue to monitor and evaluate progress toward psychosocial goal(s) of decreased stress.   Physical assessment reveals heart rate is normal, breath sounds diminished with coarse crackles to auscultation, no wheezes. Grip strength equal, strong. Distal pulses present. Larry [name] reports he  does not take medications as prescribed. Patient states he  follows a regular  diet. The patient reports no specific efforts to gain or lose weight.. Pt's weight will be monitored closely.   Demonstration and practice of PLB using pulse oximeter. Larry [name] able to return demonstration satisfactorily. Safety and hand hygiene in the exercise area reviewed with patient. Larry [name] voices understanding of the information reviewed. Department expectations discussed with patient and achievable goals were set. The patient shows enthusiasm about attending the program and we look forward to working with Larry [name]. Larry [name] completed a 6 min walk test today and is scheduled to begin exercise on 04/26/23 at 1015.   [id number] Essie Hart, RN, BSN

## 2023-04-18 NOTE — Progress Notes (Signed)
Pulmonary Individual Treatment Plan  Patient Details  Name: Larry [name] MRN: [id number] Date of Birth: June 10, 1950 Referring Provider:   Doristine Devoid Pulmonary Rehab Walk Test from 04/18/2023 in [facility] for Heart, Vascular, & Lung Health  Referring Provider Mannam       Initial Encounter Date:  Flowsheet Row Pulmonary Rehab Walk Test from 04/18/2023 in [facility], [facility]  Date 04/18/23       Visit Diagnosis: Stage 3 severe COPD by GOLD classification (HCC)  Patient's Home Medications on Admission:   Current Outpatient Medications:    amLODipine (NORVASC) 10 MG tablet, Take 1 tablet (10 mg total) by mouth daily., Disp: 90 tablet, Rfl: 1   aspirin 81 MG chewable tablet, Chew 81 mg by mouth daily. , Disp: , Rfl:    ferrous sulfate 325 (65 FE) MG tablet, Take 325 mg by mouth daily., Disp: , Rfl:    metFORMIN (GLUCOPHAGE) 500 MG tablet, metformin 500 mg tablet  Take 1 tablet every day by oral route., Disp: , Rfl:    rosuvastatin (CRESTOR) 40 MG tablet, Take 1 tablet (40 mg total) by mouth daily., Disp: 30 tablet, Rfl: 5   Tiotropium Bromide-Olodaterol (STIOLTO RESPIMAT) 2.5-2.5 MCG/ACT AERS, Inhale 2 puffs into the lungs daily., Disp: 8 g, Rfl: 0   buPROPion (WELLBUTRIN SR) 100 MG 12 hr tablet, TAKE 1 TABLET BY MOUTH TWICE A DAY (Patient not taking: Reported on 04/18/2023), Disp: 180 tablet, Rfl: 2   metoprolol succinate (TOPROL-XL) 50 MG 24 hr tablet, Take 1 tablet (50 mg total) by mouth daily. (Patient not taking: Reported on 04/18/2023), Disp: 90 tablet, Rfl: 3   nicotine (NICODERM CQ - DOSED IN MG/24 HOURS) 21 mg/24hr patch, Place 1 patch (21 mg total) onto the skin daily. (Patient not taking: Reported on 04/18/2023), Disp: 28 patch, Rfl: 1   varenicline (CHANTIX) 0.5 MG tablet, Take 1 tablet (0.5 mg total) by mouth 2 (two) times daily. 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, N1, M0)  s/p  left upper lobectomy with node dissection and neoadjuvant systemic chemotherapy   Productive cough    intermittantly   Prostate cancer [facility]) urologist-- dr wrenn/  oncologsit-  dr Kathrynn Running   Stage T1c , Gleason 4+3, PSA 4.1, vol 26.73cc--  External RXT complete and Radiactive seed implants on 04-03-2015   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 01/20/2023 Tay    Labs: Review Flowsheet  More data exists      Latest Ref Rng & Units 09/10/2014 03/30/2019 07/24/2019 07/26/2019 07/27/2019  Labs for ITP Cardiac and Pulmonary Rehab  Cholestrol 100 - 199 mg/dL - 604  - - -  LDL (calc) 0 - 99 mg/dL - 540  - - -  HDL-C >98 mg/dL - 47  - - -  Trlycerides 0 - 149 mg/dL - 119  - - -  PH, Arterial 7.350 - 7.450 7.425  - 7.408  7.300  7.223  7.179  7.418   PCO2 arterial 32.0 - 48.0 mmHg 44.5  - 36.3  58.2  62.7  68.3  38.3   Bicarbonate 20.0 - 28.0 mmol/L 29.2  - 22.5  28.6  25.8  25.4  24.3   TCO2 22 - 32 mmol/L 31  - - 30  28  27   -  Acid-base deficit 0.0 - 2.0 mmol/L - - 1.5  3.0  4.0  -  O2 Saturation % 98.0  - 97.9  100.0  89.0  87.0  96.2     Details       Multiple values from one day are sorted in reverse-chronological order         Capillary  Blood Glucose: Lab Results  Component Value Date   GLUCAP 132 (H) 07/31/2019   GLUCAP 123 (H) 06/18/2019   GLUCAP 129 (H) 09/14/2014   GLUCAP 148 (H) 09/13/2014   GLUCAP 94 09/13/2014     Pulmonary Assessment Scores:  Pulmonary Assessment Scores     Row Name 04/18/23 1049         ADL UCSD   ADL Phase Entry     SOB Score total 29       CAT Score   CAT Score 24       mMRC Score   mMRC Score 3             UCSD: Self-administered rating of dyspnea associated with activities of daily living (ADLs) 6-point scale (0 = "not at all" to 5 = "maximal or unable to do because of breathlessness")  Scoring Scores range from 0 to 120.  Minimally important difference is 5 units  CAT: CAT can identify the health impairment of COPD patients and is better correlated with disease progression.  CAT has a scoring range of zero to 40. The CAT score is classified into four groups of low (less than 10), medium (10 - 20), high (21-30) and very high (31-40) based on the impact level of disease on health status. A CAT score over 10 suggests significant symptoms.  A worsening CAT score could be explained by an exacerbation, poor medication adherence, poor inhaler technique, or progression of COPD or comorbid conditions.  CAT MCID is 2 points  mMRC: mMRC (Modified Medical Research Council) Dyspnea Scale is used to assess the degree of baseline functional disability in patients of respiratory disease due to dyspnea. No minimal important difference is established. A decrease in score of 1 point or greater is considered a positive change.   Pulmonary Function Assessment:   Exercise Target Goals: Exercise Program Goal: Individual exercise prescription set using results from initial 6 min walk test and THRR while considering  patient's activity barriers and safety.   Exercise Prescription Goal: Initial exercise prescription builds to 30-45 minutes a day of aerobic activity, 2-3 days per week.   Home exercise guidelines will be given to patient during program as part of exercise prescription that the participant will acknowledge.  Activity Barriers & Risk Stratification:  Activity Barriers & Cardiac Risk Stratification - 04/18/23 1045       Activity Barriers & Cardiac Risk Stratification  Activity Barriers Deconditioning;Muscular Weakness;Shortness of Breath             6 Minute Walk:  6 Minute Walk     Row Name 04/18/23 1208         6 Minute Walk   Phase Initial     Distance 875 feet     Walk Time 6 minutes     # of Rest Breaks 1  3:57-4:38     MPH 1.66     METS 2.46     RPE 13     Perceived Dyspnea  2     VO2 Peak 8.61     Symptoms No     Resting HR 87 bpm     Resting BP 130/74     Resting Oxygen Saturation  96 %     Exercise Oxygen Saturation  during 6 min walk 92 %     Max Ex. HR 117 bpm     Max Ex. BP 156/74     2 Minute Post BP 144/68       Interval HR   1 Minute HR 105     2 Minute HR 113     3 Minute HR 117     4 Minute HR 116     5 Minute HR 109     6 Minute HR 116     2 Minute Post HR 99     Interval Heart Rate? Yes       Interval Oxygen   Interval Oxygen? Yes     Baseline Oxygen Saturation % 96 %     1 Minute Oxygen Saturation % 98 %     1 Minute Liters of Oxygen 0 L     2 Minute Oxygen Saturation % 97 %     2 Minute Liters of Oxygen 0 L     3 Minute Oxygen Saturation % 94 %     3 Minute Liters of Oxygen 0 L     4 Minute Oxygen Saturation % 92 %     4 Minute Liters of Oxygen 0 L     5 Minute Oxygen Saturation % 92 %     5 Minute Liters of Oxygen 0 L     6 Minute Oxygen Saturation % 95 %     6 Minute Liters of Oxygen 0 L     2 Minute Post Oxygen Saturation % 97 %     2 Minute Post Liters of Oxygen 0 L              Oxygen Initial Assessment:  Oxygen Initial Assessment - 04/18/23 1049       Home Oxygen   Home Oxygen Device None    Sleep Oxygen Prescription None    Home Exercise Oxygen Prescription None    Home  Resting Oxygen Prescription None             Oxygen Re-Evaluation:   Oxygen Discharge (Final Oxygen Re-Evaluation):   Initial Exercise Prescription:  Initial Exercise Prescription - 04/18/23 1200       Date of Initial Exercise RX and Referring Provider   Date 04/18/23    Referring Provider Mannam    Expected Discharge Date 07/14/23      Recumbant Bike   Level 1    Minutes 15    METs 2      NuStep   Level 1    SPM 60    Minutes 15    METs 2  Prescription Details   Frequency (times per week) 2    Duration Progress to 30 minutes of continuous aerobic without signs/symptoms of physical distress      Intensity   THRR 40-80% of Max Heartrate 59-118    Ratings of Perceived Exertion 11-13    Perceived Dyspnea 0-4      Progression   Progression Continue to progress workloads to maintain intensity without signs/symptoms of physical distress.      Resistance Training   Training Prescription Yes    Weight blue bands    Reps 10-15             Perform Capillary Blood Glucose checks as needed.  Exercise Prescription Changes:   Exercise Comments:   Exercise Goals and Review:   Exercise Goals     Row Name 04/18/23 1045             Exercise Goals   Increase Physical Activity Yes       Intervention Provide advice, education, support and counseling about physical activity/exercise needs.;Develop an individualized exercise prescription for aerobic and resistive training based on initial evaluation findings, risk stratification, comorbidities and participant's personal goals.       Expected Outcomes Short Term: Attend rehab on a regular basis to increase amount of physical activity.;Long Term: Exercising regularly at least 3-5 days a week.;Long Term: Add in home exercise to make exercise part of routine and to increase amount of physical activity.       Increase Strength and Stamina Yes       Intervention Provide advice, education, support and counseling  about physical activity/exercise needs.;Develop an individualized exercise prescription for aerobic and resistive training based on initial evaluation findings, risk stratification, comorbidities and participant's personal goals.       Expected Outcomes Short Term: Increase workloads from initial exercise prescription for resistance, speed, and METs.;Short Term: Perform resistance training exercises routinely during rehab and add in resistance training at home;Long Term: Improve cardiorespiratory fitness, muscular endurance and strength as measured by increased METs and functional capacity ( )       Able to understand and use rate of perceived exertion (RPE) scale Yes       Intervention Provide education and explanation on how to use RPE scale       Expected Outcomes Short Term: Able to use RPE daily in rehab to express subjective intensity level;Long Term:  Able to use RPE to guide intensity level when exercising independently       Able to understand and use Dyspnea scale Yes       Intervention Provide education and explanation on how to use Dyspnea scale       Expected Outcomes Short Term: Able to use Dyspnea scale daily in rehab to express subjective sense of shortness of breath during exertion;Long Term: Able to use Dyspnea scale to guide intensity level when exercising independently       Knowledge and understanding of Target Heart Rate Range (THRR) Yes       Intervention Provide education and explanation of THRR including how the numbers were predicted and where they are located for reference       Expected Outcomes Short Term: Able to state/look up THRR;Short Term: Able to use daily as guideline for intensity in rehab;Long Term: Able to use THRR to govern intensity when exercising independently       Understanding of Exercise Prescription Yes       Intervention Provide education, explanation, and written materials on patient's individual  exercise prescription       Expected Outcomes Short  Term: Able to explain program exercise prescription;Long Term: Able to explain home exercise prescription to exercise independently                Exercise Goals Re-Evaluation :   Discharge Exercise Prescription (Final Exercise Prescription Changes):   Nutrition:  Target Goals: Understanding of nutrition guidelines, daily intake of sodium 1500mg , cholesterol 200mg , calories 30% from fat and 7% or less from saturated fats, daily to have 5 or more servings of fruits and vegetables.  Biometrics:    Nutrition Therapy Plan and Nutrition Goals:   Nutrition Assessments:  MEDIFICTS Score Key: >=70 Need to make dietary changes  40-70 Heart Healthy Diet <= 40 Therapeutic Level Cholesterol Diet   Picture Your Plate Scores: <16 Unhealthy dietary pattern with much room for improvement. 41-50 Dietary pattern unlikely to meet recommendations for good health and room for improvement. 51-60 More healthful dietary pattern, with some room for improvement.  >60 Healthy dietary pattern, although there may be some specific behaviors that could be improved.    Nutrition Goals Re-Evaluation:   Nutrition Goals Discharge (Final Nutrition Goals Re-Evaluation):   Psychosocial: Target Goals: Acknowledge presence or absence of significant depression and/or stress, maximize coping skills, provide positive support system. Participant is able to verbalize types and ability to use techniques and skills needed for reducing stress and depression.  Initial Review & Psychosocial Screening:  Initial Psych Review & Screening - 04/18/23 1051       Initial Review   Current issues with None Identified      Family Dynamics   Good Support System? Yes      Barriers   Psychosocial barriers to participate in program There are no identifiable barriers or psychosocial needs.      Screening Interventions   Interventions Provide feedback about the scores to participant             Quality of Life  Scores:  Scores of 19 and below usually indicate a poorer quality of life in these areas.  A difference of  2-3 points is a clinically meaningful difference.  A difference of 2-3 points in the total score of the Quality of Life Index has been associated with significant improvement in overall quality of life, self-image, physical symptoms, and general health in studies assessing change in quality of life.  PHQ-9: Review Flowsheet  More data exists      04/18/2023 04/18/2015 02/28/2015 02/21/2015 02/06/2015  Depression screen PHQ 2/9  Decreased Interest 0 0 0 0 0  Down, Depressed, Hopeless 0 0 0 0 0  PHQ - 2 Score 0 0 0 0 0  Altered sleeping 1 - - - -  Tired, decreased energy 1 - - - -  Change in appetite 0 - - - -  Feeling bad or failure about yourself  0 - - - -  Trouble concentrating 0 - - - -  Moving slowly or fidgety/restless 0 - - - -  Suicidal thoughts 0 - - - -  PHQ-9 Score 2 - - - -  Difficult doing work/chores Not difficult at all - - - -    Details           Interpretation of Total Score  Total Score Depression Severity:  1-4 = Minimal depression, 5-9 = Mild depression, 10-14 = Moderate depression, 15-19 = Moderately severe depression, 20-27 = Severe depression   Psychosocial Evaluation and Intervention:  Psychosocial Evaluation -  04/18/23 1051       Psychosocial Evaluation & Interventions   Interventions Encouraged to exercise with the program and follow exercise prescription    Comments Tien denies any barriers or psy/soc concerns at this time    Expected Outcomes For Majd to exercise in Pulm Rehab without any barriers    Continue Psychosocial Services  No Follow up required             Psychosocial Re-Evaluation:   Psychosocial Discharge (Final Psychosocial Re-Evaluation):   Education: Education Goals: Education classes will be provided on a weekly basis, covering required topics. Participant will state understanding/return demonstration of  topics presented.  Learning Barriers/Preferences:   Education Topics: Know Your Numbers Group instruction that is supported by a PowerPoint presentation. Instructor discusses importance of knowing and understanding resting, exercise, and post-exercise oxygen saturation, heart rate, and blood pressure. Oxygen saturation, heart rate, blood pressure, rating of perceived exertion, and dyspnea are reviewed along with a normal range for these values.    Exercise for the Pulmonary Patient Group instruction that is supported by a PowerPoint presentation. Instructor discusses benefits of exercise, core components of exercise, frequency, duration, and intensity of an exercise routine, importance of utilizing pulse oximetry during exercise, safety while exercising, and options of places to exercise outside of rehab.    MET Level  Group instruction provided by PowerPoint, verbal discussion, and written material to support subject matter. Instructor reviews what METs are and how to increase METs.    Pulmonary Medications Verbally interactive group education provided by instructor with focus on inhaled medications and proper administration.   Anatomy and Physiology of the Respiratory System Group instruction provided by PowerPoint, verbal discussion, and written material to support subject matter. Instructor reviews respiratory cycle and anatomical components of the respiratory system and their functions. Instructor also reviews differences in obstructive and restrictive respiratory diseases with examples of each.    Oxygen Safety Group instruction provided by PowerPoint, verbal discussion, and written material to support subject matter. There is an overview of "What is Oxygen" and "Why do we need it".  Instructor also reviews how to create a safe environment for oxygen use, the importance of using oxygen as prescribed, and the risks of noncompliance. There is a brief discussion on traveling with oxygen  and resources the patient may utilize.   Oxygen Use Group instruction provided by PowerPoint, verbal discussion, and written material to discuss how supplemental oxygen is prescribed and different types of oxygen supply systems. Resources for more information are provided.    Breathing Techniques Group instruction that is supported by demonstration and informational handouts. Instructor discusses the benefits of pursed lip and diaphragmatic breathing and detailed demonstration on how to perform both.     Risk Factor Reduction Group instruction that is supported by a PowerPoint presentation. Instructor discusses the definition of a risk factor, different risk factors for pulmonary disease, and how the heart and lungs work together.   Pulmonary Diseases Group instruction provided by PowerPoint, verbal discussion, and written material to support subject matter. Instructor gives an overview of the different type of pulmonary diseases. There is also a discussion on risk factors and symptoms as well as ways to manage the diseases.   Stress and Energy Conservation Group instruction provided by PowerPoint, verbal discussion, and written material to support subject matter. Instructor gives an overview of stress and the impact it can have on the body. Instructor also reviews ways to reduce stress. There is also a discussion on energy  conservation and ways to conserve energy throughout the day.   Warning Signs and Symptoms Group instruction provided by PowerPoint, verbal discussion, and written material to support subject matter. Instructor reviews warning signs and symptoms of stroke, heart attack, cold and flu. Instructor also reviews ways to prevent the spread of infection.   Other Education Group or individual verbal, written, or video instructions that support the educational goals of the pulmonary rehab program.    Knowledge Questionnaire Score:  Knowledge Questionnaire Score - 04/18/23  1110       Knowledge Questionnaire Score   Pre Score 12/18             Core Components/Risk Factors/Patient Goals at Admission:  Personal Goals and Risk Factors at Admission - 04/18/23 1052       Core Components/Risk Factors/Patient Goals on Admission   Tobacco Cessation Yes    Intervention Assist the participant in steps to quit. Provide individualized education and counseling about committing to Tobacco Cessation, relapse prevention, and pharmacological support that can be provided by physician.;Education officer, environmental, assist with locating and accessing local/national Quit Smoking programs, and support quit date choice.    Expected Outcomes Short Term: Will demonstrate readiness to quit, by selecting a quit date.;Long Term: Complete abstinence from all tobacco products for at least 12 months from quit date.;Short Term: Will quit all tobacco product use, adhering to prevention of relapse plan.    Improve shortness of breath with ADL's Yes    Intervention Provide education, individualized exercise plan and daily activity instruction to help decrease symptoms of SOB with activities of daily living.    Expected Outcomes Short Term: Improve cardiorespiratory fitness to achieve a reduction of symptoms when performing ADLs;Long Term: Be able to perform more ADLs without symptoms or delay the onset of symptoms    Increase knowledge of respiratory medications and ability to use respiratory devices properly  Yes    Intervention Provide education and demonstration as needed of appropriate use of medications, inhalers, and oxygen therapy.    Expected Outcomes Short Term: Achieves understanding of medications use. Understands that oxygen is a medication prescribed by physician. Demonstrates appropriate use of inhaler and oxygen therapy.;Long Term: Maintain appropriate use of medications, inhalers, and oxygen therapy.    Diabetes Yes    Intervention Provide education about signs/symptoms and  action to take for hypo/hyperglycemia.;Provide education about proper nutrition, including hydration, and aerobic/resistive exercise prescription along with prescribed medications to achieve blood glucose in normal ranges: Fasting glucose 65-99 mg/dL    Expected Outcomes Short Term: Participant verbalizes understanding of the signs/symptoms and immediate care of hyper/hypoglycemia, proper foot care and importance of medication, aerobic/resistive exercise and nutrition plan for blood glucose control.             Core Components/Risk Factors/Patient Goals Review:    Core Components/Risk Factors/Patient Goals at Discharge (Final Review):    ITP Comments:   Comments: Dr. Mechele Collin is Medical Director for Pulmonary Rehab at [facility].

## 2023-04-18 NOTE — Progress Notes (Signed)
Larry [name] 72 y.o. male  Initial Psychosocial Assessment  Pt psychosocial assessment reveals pt lives alone. Pt is currently retired. Pt hobbies include watching tv, sitting on porch watching birds, people and cars driving by. Pt reports his  stress level is low. Areas of stress/anxiety include health.  Pt does not exhibit signs of depression. Signs of depression include sadness and fatigue. Pt shows good  coping skills with positive outlook. Offered emotional support and reassurance. We will monitor and evaluate progress toward psychosocial goal(s).  Goal(s): Improved management of stress Improved coping skills Help patient work toward returning to meaningful activities that improve patient's QOL and are attainable with patient's lung disease   04/18/2023 11:34 AM

## 2023-04-18 NOTE — Telephone Encounter (Signed)
Pt sister Elease Hashimoto called ad stated pt is interested in PR.Patient will come in for orientation on 04/18/23 @ 10:30AM and will attend the 10:15AM exercise class.

## 2023-04-20 ENCOUNTER — Ambulatory Visit (HOSPITAL_BASED_OUTPATIENT_CLINIC_OR_DEPARTMENT_OTHER): Payer: 59 | Admitting: Pulmonary Disease

## 2023-04-20 ENCOUNTER — Encounter: Payer: Self-pay | Admitting: Pulmonary Disease

## 2023-04-20 ENCOUNTER — Ambulatory Visit: Payer: 59 | Admitting: Pulmonary Disease

## 2023-04-20 VITALS — BP 130/88 | HR 98 | Temp 98.4°F | Ht 71.0 in | Wt 188.4 lb

## 2023-04-20 DIAGNOSIS — J449 Chronic obstructive pulmonary disease, unspecified: Secondary | ICD-10-CM | POA: Diagnosis not present

## 2023-04-20 DIAGNOSIS — F17210 Nicotine dependence, cigarettes, uncomplicated: Secondary | ICD-10-CM | POA: Diagnosis not present

## 2023-04-20 LAB — PULMONARY FUNCTION TEST
DL/VA % pred: 60 %
DL/VA: 2.41 ml/min/mmHg/L
DLCO cor % pred: 45 %
DLCO cor: 11.97 ml/min/mmHg
DLCO unc % pred: 45 %
DLCO unc: 11.97 ml/min/mmHg
FEF 25-75 Post: 0.47 L/s
FEF 25-75 Pre: 0.38 L/s
FEF2575-%Change-Post: 23 %
FEF2575-%Pred-Post: 19 %
FEF2575-%Pred-Pre: 15 %
FEV1-%Change-Post: 7 %
FEV1-%Pred-Post: 30 %
FEV1-%Pred-Pre: 28 %
FEV1-Post: 1 L
FEV1-Pre: 0.93 L
FEV1FVC-%Change-Post: -1 %
FEV1FVC-%Pred-Pre: 58 %
FEV6-%Change-Post: 8 %
FEV6-%Pred-Post: 52 %
FEV6-%Pred-Pre: 48 %
FEV6-Post: 2.23 L
FEV6-Pre: 2.06 L
FEV6FVC-%Change-Post: 0 %
FEV6FVC-%Pred-Post: 100 %
FEV6FVC-%Pred-Pre: 100 %
FVC-%Change-Post: 8 %
FVC-%Pred-Post: 52 %
FVC-%Pred-Pre: 47 %
FVC-Post: 2.36 L
FVC-Pre: 2.17 L
Post FEV1/FVC ratio: 42 %
Post FEV6/FVC ratio: 94 %
Pre FEV1/FVC ratio: 43 %
Pre FEV6/FVC Ratio: 95 %
RV % pred: 128 %
RV: 3.32 L
TLC % pred: 84 %
TLC: 6.18 L

## 2023-04-20 MED ORDER — BREZTRI AEROSPHERE 160-9-4.8 MCG/ACT IN AERO: 2.0000 | INHALATION_SPRAY | Freq: Two times a day (BID) | RESPIRATORY_TRACT | 3 refills | Status: AC

## 2023-04-20 MED ORDER — VARENICLINE TARTRATE 0.5 MG PO TABS: 0.5000 mg | ORAL_TABLET | Freq: Two times a day (BID) | ORAL | 2 refills | Status: AC

## 2023-04-20 MED ORDER — BREZTRI AEROSPHERE 160-9-4.8 MCG/ACT IN AERO: 2.0000 | INHALATION_SPRAY | Freq: Two times a day (BID) | RESPIRATORY_TRACT | Status: DC

## 2023-04-20 NOTE — Progress Notes (Signed)
Larry [name]    [id number]    27-Dec-1950  Primary Care Physician:Skakle, Eliberto Ivory, DO  Referring Physician: Charlane Ferretti, DO [address] [place],  [place] [address]  Chief complaint: Follow up for abnormal CT, COPD  HPI: 72 y.o. 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   Here for evaluation of COPD and abnormal CT Complains of dyspnea on exertion for many years.  Symptoms are mostly when she is walking and trying to exercise.  No symptoms at rest.  She has occasional cough with mucus production and wheezing.  Currently on Stiolto inhaler.  History notable for Stage IIA (T1a, N1, M0) non-small cell lung cancer, squamous cell carcinoma diagnosed in November 2015, presented with left suprahilar soft tissue mass.  He underwent left upper lobectomy and mediastinal dissection in 2016 and then right lower lobe segmentectomy and right middle lobe wedge resection by Dr. Dorris Fetch in February 2021.  Neoadjuvant chemotherapy by Dr. Arbutus Ped.  He continues close follow-up with oncology.  Pets: No pets Occupation: Retired Exposures: No mold, hot tub, Jacuzzi.  No feather pillows or comforters Smoking history: 60-pack-year smoker.  Continues to smoke 1 pack/day Travel history: No significant travel history Relevant family history: No family history of lung disease  Interim History Discussed the use of AI scribe software for clinical note transcription with the patient, who gave verbal consent to proceed.  The patient, with a history of severe COPD and lung cancer,  presents for follow-up. He reports no change in his respiratory symptoms. He was prescribed Breztri at the last visit to replace Stiolto, but the prescription was not received by the pharmacy. He is currently using Stiolto and albuterol. He continues to smoke at the same rate and has not been successful with smoking cessation attempts. The nicotine patch was not tolerated due to skin reactions. He has not yet tried Chantix. He has completed treatment for lung cancer and is being followed with regular CT scans, the most recent of which showed no recurrence of disease. He has started pulmonary rehabilitation at [facility]   Outpatient Encounter Medications as of 04/20/2023  Medication Sig   amLODipine (NORVASC) 10 MG tablet Take 1 tablet (10 mg total) by mouth daily.   ferrous sulfate 325 (65 FE) MG tablet Take 325 mg by mouth daily.   Tiotropium Bromide-Olodaterol (STIOLTO RESPIMAT) 2.5-2.5 MCG/ACT AERS Inhale 2 puffs into the lungs daily.   albuterol (VENTOLIN HFA) 108 (90 Base) MCG/ACT inhaler Inhale 2 puffs into the lungs every 6 (six) hours as needed.   aspirin 81 MG chewable tablet Chew 81 mg by mouth daily.  (Patient not taking: Reported on 04/20/2023)   buPROPion (WELLBUTRIN SR) 100 MG 12 hr tablet TAKE 1 TABLET BY MOUTH TWICE A DAY (Patient not taking: Reported on 04/18/2023)   metFORMIN (GLUCOPHAGE) 500 MG tablet metformin 500 mg tablet  Take 1 tablet every day by oral route. (Patient not taking: Reported on 04/20/2023)   metoprolol succinate (TOPROL-XL) 50 MG 24 hr tablet Take 1 tablet (50 mg total) by mouth daily. (Patient not taking: Reported on 04/18/2023)   rosuvastatin (CRESTOR) 40 MG tablet Take 1  tablet (40 mg total) by mouth daily. (Patient not taking: Reported on 04/20/2023)   varenicline (CHANTIX) 0.5 MG tablet Take 1 tablet (0.5 mg total) by mouth 2 (two) times daily. (Patient not taking: Reported on 04/18/2023)   [DISCONTINUED] nicotine (NICODERM CQ - DOSED IN MG/24 HOURS) 21 mg/24hr  patch Place 1 patch (21 mg total) onto the skin daily. (Patient not taking: Reported on 04/18/2023)   No facility-administered encounter medications on file as of 04/20/2023.    Physical Exam: Blood pressure 130/88, pulse 98, temperature 98.4 F (36.9 C), temperature source Oral, height 5\' 11"  (1.803 m), weight 188 lb 6.4 oz (85.5 kg), SpO2 96%. 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 I had reviewed the images personally  PFTs: 07/02/2019- Very severe obstructive airway disease  04/20/2023 FVC 2.36 [52%], FEV1 1.00 [30%], F/F42, TLC 6.18 [84%], DLCO 11.97 [45%] Severe obstructive lung disease, moderate diffusion defect  Labs: CBC 12/14/2022-WBC 7.9, eos 3%, absolute eosinophil count 237  Assessment:  Chronic Obstructive Pulmonary Disease (COPD) Severe COPD confirmed by lung function test. Patient currently on Stiolto and Albuterol. Prescription for Markus Daft was not received by the pharmacy. -Send prescription for [place] to [facility] on [address]. -Discontinue Stiolto and start [place] once received. -Continue Albuterol as needed.  Tobacco Use Disorder Continued smoking despite severe COPD. Patient reports skin reaction to nicotine patches. Chantix was previously ordered but not received by the patient.  Time spent counseling-5 minutes.  Reassess at return visit  -Resend prescription for Chantix to [facility] on [address]. -Advise patient to  purchase over-the-counter nicotine gum to use in conjunction with Chantix.  Lung Cancer Completed chemotherapy and currently under surveillance with Dr. Gwenyth Bouillon. Recent CT scan in August showed no recurrence of disease. -Continue follow-up with Dr. Jerolyn Center as planned.  Pulmonary Rehabilitation Patient has started pulmonary rehabilitation at [facility]. -Continue pulmonary rehabilitation as scheduled.  General Health Maintenance / Followup Plans -Continue efforts for smoking cessation with Chantix and nicotine gum. -Continue pulmonary rehabilitation. -Follow-up as needed for any concerns or changes in condition.   Plan/Recommendations: Change Stiolto to [employer] Pulmonary rehab Chantix, nicotine gum for smoking cessation  Chilton Greathouse MD  Pulmonary and Critical Care 04/20/2023, 11:49 AM  CC: Charlane Ferretti, DO

## 2023-04-20 NOTE — Progress Notes (Signed)
Full PFT Performed Today  

## 2023-04-20 NOTE — Patient Instructions (Signed)
Full PFT Performed Today  

## 2023-04-20 NOTE — Patient Instructions (Signed)
VISIT SUMMARY:  You came in today for a follow-up visit regarding your severe COPD and lung cancer. We discussed your current medications, smoking habits, and recent treatments. You reported no changes in your respiratory symptoms and are continuing with your current medications. We also reviewed your recent CT scan results and your progress in pulmonary rehabilitation.  YOUR PLAN:  -CHRONIC OBSTRUCTIVE PULMONARY DISEASE (COPD): COPD is a chronic lung disease that makes it hard to breathe. You will switch from Stiolto to [place] once the prescription is filled, and continue using Albuterol as needed.  -TOBACCO USE DISORDER: Continued smoking despite severe COPD. We will resend the prescription for Chantix to help you quit smoking, and you should also purchase over-the-counter nicotine gum to use with Chantix.  -LUNG CANCER: You have completed chemotherapy for lung cancer and your recent CT scan showed no recurrence. Continue follow-up with Dr. Jerolyn Center as planned.  -PULMONARY REHABILITATION: Pulmonary rehabilitation helps improve your lung function and overall health. Continue attending your sessions at [facility].  -GENERAL HEALTH MAINTENANCE: Continue efforts for smoking cessation with Chantix and nicotine gum, and continue pulmonary rehabilitation. Follow-up as needed for any concerns or changes in condition.  INSTRUCTIONS:  Please pick up your new prescriptions for Breztri and Chantix at [facility] on [address]. Discontinue Stiolto and start [place] once you have it. Use Albuterol as needed. Purchase over-the-counter nicotine gum to use with Chantix. Continue your follow-up with Dr. Jerolyn Center and attend your pulmonary rehabilitation sessions as scheduled. Follow up with [place] if you have any concerns or changes in your condition.

## 2023-04-20 NOTE — Addendum Note (Signed)
Addended by: Delrae Rend on: 04/20/2023 05:19 PM   Modules accepted: Orders

## 2023-04-26 ENCOUNTER — Encounter (HOSPITAL_COMMUNITY)
Admission: RE | Admit: 2023-04-26 | Discharge: 2023-04-26 | Disposition: A | Discharge status: Home / Self Care | Payer: 59 | Source: Ambulatory Visit | Attending: Pulmonary Disease | Admitting: Pulmonary Disease

## 2023-04-26 ENCOUNTER — Encounter (HOSPITAL_COMMUNITY): Payer: Self-pay

## 2023-04-26 VITALS — Wt 191.8 lb

## 2023-04-26 DIAGNOSIS — J449 Chronic obstructive pulmonary disease, unspecified: Secondary | ICD-10-CM | POA: Diagnosis not present

## 2023-04-26 LAB — GLUCOSE, CAPILLARY
Glucose-Capillary: 101 mg/dL — ABNORMAL HIGH (ref 70–99)
Glucose-Capillary: 87 mg/dL (ref 70–99)

## 2023-04-26 NOTE — Progress Notes (Signed)
Discussed smoking cessation with pt today. He sts he is on chantix and nicotine patch currently and is losing his taste for smoking. He sts he is cutting down on his cigarettes and the last cigarette he had was 12 pm yesterday. Congratulated pt on not smoking for 24 hours. Discussed tips for distraction/substition. Gave pt Quitting Smoking resource sheet. Encouraged him to talk with [place] for questions or concerns.  Ethelda Chick BS, ACSM-CEP 04/26/2023 11:55 AM

## 2023-04-26 NOTE — Progress Notes (Signed)
Daily Session Note  Patient Details  Name: Larry [name] MRN: [id number] Date of Birth: 1950-06-07 Referring Provider:   Doristine Devoid Pulmonary Rehab Walk Test from 04/18/2023 in [facility]  Referring Provider Mannam       Encounter Date: 04/26/2023  Check In:  Session Check In - 04/26/23 1021       Check-In   Supervising physician immediately available to respond to emergencies [facility] MD immediately available    Physician(s) Jari Favre, NP    Location [facility]    Staff Present Essie Hart, RN, BSN;Casey Katrinka Blazing, Zella Richer, MS, ACSM-CEP, Exercise Physiologist;Randi Idelle Crouch BS, ACSM-CEP, Exercise Physiologist    Virtual Visit No    Medication changes reported     No    Fall or balance concerns reported    No    Tobacco Cessation No Change    Warm-up and Cool-down Performed as group-led instruction    Resistance Training Performed Yes    VAD Patient? No    PAD/SET Patient? No      Pain Assessment   Currently in Pain? No/denies    Multiple Pain Sites No             Capillary Blood Glucose: Results for orders placed or performed during the hospital encounter of 04/26/23 (from the past 24 hour(s))  Glucose, capillary     Status: None   Collection Time: 04/26/23 11:28 AM  Result Value Ref Range   Glucose-Capillary 87 70 - 99 mg/dL     Exercise Prescription Changes - 04/26/23 1100       Response to Exercise   Blood Pressure (Admit) 120/74    Blood Pressure (Exercise) 146/70    Blood Pressure (Exit) 114/68    Heart Rate (Admit) 91 bpm    Heart Rate (Exercise) 113 bpm    Heart Rate (Exit) 99 bpm    Oxygen Saturation (Admit) 98 %    Oxygen Saturation (Exercise) 93 %    Oxygen Saturation (Exit) 97 %    Rating of Perceived Exertion (Exercise) 13    Perceived Dyspnea (Exercise) 1    Duration Progress to 30 minutes of  aerobic without signs/symptoms of physical distress    Intensity  THRR unchanged      Progression   Progression Continue to progress workloads to maintain intensity without signs/symptoms of physical distress.      Resistance Training   Training Prescription Yes    Weight blue bands    Reps 10-15    Time 10 Minutes      Recumbant Bike   Level 1    RPM 55    Minutes 15    METs 1.9      NuStep   Level 2    SPM 83    Minutes 15    METs 2.8             Social History   Tobacco Use  Smoking Status Every Day   Current packs/day: 0.50   Average packs/day: 0.5 packs/day for 53.0 years (26.5 ttl pk-yrs)   Types: Cigarettes  Smokeless Tobacco Never  Tobacco Comments   Smoking 1 ppd.  Hfb 04/20/2023    Goals Met:  Proper associated with RPD/PD & O2 Sat Exercise tolerated well No report of concerns or symptoms today Strength training completed today  Goals Unmet:  Not Applicable  Comments: Service time is from 1013 to 1140.    Dr. Erskine Squibb  Everardo All is Wellsite geologist for Pulmonary Rehab at [facility].

## 2023-04-27 NOTE — Progress Notes (Signed)
Pulmonary Individual Treatment Plan  Patient Details  Name: Larry [name] MRN: [id number] Date of Birth: 03-16-1951 Referring Provider:   Doristine Devoid Pulmonary Rehab Walk Test from 04/18/2023 in [facility]  Referring Provider Mannam       Initial Encounter Date:  Flowsheet Row Pulmonary Rehab Walk Test from 04/18/2023 in [facility]  Date 04/18/23       Visit Diagnosis: Stage 3 severe COPD by GOLD classification (HCC)  Patient's Home Medications on Admission:   Current Outpatient Medications:    albuterol (VENTOLIN HFA) 108 (90 Base) MCG/ACT inhaler, Inhale 2 puffs into the lungs every 6 (six) hours as needed., Disp: , Rfl:    amLODipine (NORVASC) 10 MG tablet, Take 1 tablet (10 mg total) by mouth daily., Disp: 90 tablet, Rfl: 1   aspirin 81 MG chewable tablet, Chew 81 mg by mouth daily.  (Patient not taking: Reported on 04/20/2023), Disp: , Rfl:    Budeson-Glycopyrrol-Formoterol (BREZTRI AEROSPHERE) 160-9-4.8 MCG/ACT AERO, Inhale 2 puffs into the lungs in the morning and at bedtime., Disp: 3 each, Rfl: 3   Budeson-Glycopyrrol-Formoterol (BREZTRI AEROSPHERE) 160-9-4.8 MCG/ACT AERO, Inhale 2 puffs into the lungs in the morning and at bedtime., Disp: , Rfl:    buPROPion (WELLBUTRIN SR) 100 MG 12 hr tablet, TAKE 1 TABLET BY MOUTH TWICE A DAY (Patient not taking: Reported on 04/18/2023), Disp: 180 tablet, Rfl: 2   ferrous sulfate 325 (65 FE) MG tablet, Take 325 mg by mouth daily., Disp: , Rfl:    metFORMIN (GLUCOPHAGE) 500 MG tablet, metformin 500 mg tablet  Take 1 tablet every day by oral route. (Patient not taking: Reported on 04/20/2023), Disp: , Rfl:    metoprolol succinate (TOPROL-XL) 50 MG 24 hr tablet, Take 1 tablet (50 mg total) by mouth daily. (Patient not taking: Reported on 04/18/2023), Disp: 90 tablet, Rfl: 3   rosuvastatin (CRESTOR) 40 MG tablet, Take 1  tablet (40 mg total) by mouth daily. (Patient not taking: Reported on 04/20/2023), Disp: 30 tablet, Rfl: 5   varenicline (CHANTIX) 0.5 MG tablet, Take 1 tablet (0.5 mg total) by mouth 2 (two) times daily., Disp: 120 tablet, Rfl: 2  Past Medical History: Past Medical History:  Diagnosis Date   Angiodysplasia of cecum 06/01/2018   Cataract    Chronic cough    COPD (chronic obstructive pulmonary disease) (HCC)    Dyspnea on exertion    Full dentures    Hx of adenomatous colonic polyps 06/07/2018   Hyperlipidemia    Non-small cell carcinoma of left lung [facility]) oncologist-- dr Arbutus Ped   dx Nov 2015left upper lobe, 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, N1, M0)  s/p  left upper lobectomy with node dissection and neoadjuvant systemic chemotherapy   Productive cough    intermittantly   Prostate cancer [facility]) urologist-- dr wrenn/  oncologsit-  dr Kathrynn Running   Stage T1c , Gleason 4+3, PSA 4.1, vol 26.73cc--  External RXT complete and Radiactive seed implants on 04-03-2015   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  Hfb 04/20/2023    Labs: Review  Flowsheet  More data exists      Latest Ref Rng & Units 09/10/2014 03/30/2019 07/24/2019 07/26/2019 07/27/2019  Labs for ITP Cardiac and Pulmonary Rehab  Cholestrol 100 - 199 mg/dL - 244  - - -  LDL (calc) 0 - 99 mg/dL - 010  - - -  HDL-C >27 mg/dL - 47  - - -  Trlycerides 0 - 149 mg/dL - 253  - - -  PH, Arterial 7.350 - 7.450 7.425  - 7.408  7.300  7.223  7.179  7.418   PCO2 arterial 32.0 - 48.0 mmHg 44.5  - 36.3  58.2  62.7  68.3  38.3   Bicarbonate 20.0 - 28.0 mmol/L 29.2  - 22.5  28.6  25.8  25.4  24.3   TCO2 22 - 32 mmol/L 31  - - 30  28  27   -  Acid-base deficit 0.0  - 2.0 mmol/L - - 1.5  3.0  4.0  -  O2 Saturation % 98.0  - 97.9  100.0  89.0  87.0  96.2     Details       Multiple values from one day are sorted in reverse-chronological order         Capillary Blood Glucose: Lab Results  Component Value Date   GLUCAP 87 04/26/2023   GLUCAP 101 (H) 04/26/2023   GLUCAP 132 (H) 07/31/2019   GLUCAP 123 (H) 06/18/2019   GLUCAP 129 (H) 09/14/2014     Pulmonary Assessment Scores:  Pulmonary Assessment Scores     Row Name 04/18/23 1049         ADL UCSD   ADL Phase Entry     SOB Score total 29       CAT Score   CAT Score 24       mMRC Score   mMRC Score 3             UCSD: Self-administered rating of dyspnea associated with activities of daily living (ADLs) 6-point scale (0 = "not at all" to 5 = "maximal or unable to do because of breathlessness")  Scoring Scores range from 0 to 120.  Minimally important difference is 5 units  CAT: CAT can identify the health impairment of COPD patients and is better correlated with disease progression.  CAT has a scoring range of zero to 40. The CAT score is classified into four groups of low (less than 10), medium (10 - 20), high (21-30) and very high (31-40) based on the impact level of disease on health status. A CAT score over 10 suggests significant symptoms.  A worsening CAT score could be explained by an exacerbation, poor medication adherence, poor inhaler technique, or progression of COPD or comorbid conditions.  CAT MCID is 2 points  mMRC: mMRC (Modified Medical Research Council) Dyspnea Scale is used to assess the degree of baseline functional disability in patients of respiratory disease due to dyspnea. No minimal important difference is established. A decrease in score of 1 point or greater is considered a positive change.   Pulmonary Function Assessment:   Exercise Target Goals: Exercise Program Goal: Individual exercise prescription set using results from initial 6 min walk  test and THRR while considering  patient's activity barriers and safety.   Exercise Prescription Goal: Initial exercise prescription builds to 30-45 minutes a day of aerobic activity, 2-3 days per week.  Home exercise guidelines will be given to patient during program as part of exercise prescription that the participant will acknowledge.  Activity Barriers & Risk Stratification:  Activity Barriers & Cardiac Risk Stratification - 04/18/23 1045       Activity Barriers & Cardiac Risk Stratification   Activity Barriers Deconditioning;Muscular Weakness;Shortness of Breath             6 Minute Walk:  6 Minute Walk     Row Name 04/18/23 1208         6 Minute Walk   Phase Initial     Distance 875 feet     Walk Time 6 minutes     # of Rest Breaks 1  3:57-4:38     MPH 1.66     METS 2.46     RPE 13     Perceived Dyspnea  2     VO2 Peak 8.61     Symptoms No     Resting HR 87 bpm     Resting BP 130/74     Resting Oxygen Saturation  96 %     Exercise Oxygen Saturation  during 6 min walk 92 %     Max Ex. HR 117 bpm     Max Ex. BP 156/74     2 Minute Post BP 144/68       Interval HR   1 Minute HR 105     2 Minute HR 113     3 Minute HR 117     4 Minute HR 116     5 Minute HR 109     6 Minute HR 116     2 Minute Post HR 99     Interval Heart Rate? Yes       Interval Oxygen   Interval Oxygen? Yes     Baseline Oxygen Saturation % 96 %     1 Minute Oxygen Saturation % 98 %     1 Minute Liters of Oxygen 0 L     2 Minute Oxygen Saturation % 97 %     2 Minute Liters of Oxygen 0 L     3 Minute Oxygen Saturation % 94 %     3 Minute Liters of Oxygen 0 L     4 Minute Oxygen Saturation % 92 %     4 Minute Liters of Oxygen 0 L     5 Minute Oxygen Saturation % 92 %     5 Minute Liters of Oxygen 0 L     6 Minute Oxygen Saturation % 95 %     6 Minute Liters of Oxygen 0 L     2 Minute Post Oxygen Saturation % 97 %     2 Minute Post Liters of Oxygen 0 L               Oxygen Initial Assessment:  Oxygen Initial Assessment - 04/18/23 1049       Home Oxygen   Home Oxygen Device None    Sleep Oxygen Prescription None    Home Exercise Oxygen Prescription None    Home Resting Oxygen Prescription None      Initial 6 min Walk   Oxygen Used None      Program Oxygen Prescription   Program Oxygen Prescription None      Intervention   Short Term Goals To learn and understand importance of monitoring SPO2 with pulse oximeter and demonstrate accurate use of the pulse oximeter.;To learn and understand importance of maintaining oxygen saturations>88%;To learn and demonstrate proper pursed lip breathing techniques or other breathing techniques. ;To learn and demonstrate  proper use of respiratory medications    Long  Term Goals Maintenance of O2 saturations>88%;Compliance with respiratory medication;Verbalizes importance of monitoring SPO2 with pulse oximeter and return demonstration;Exhibits proper breathing techniques, such as pursed lip breathing or other method taught during program session;Demonstrates proper use of MDI's             Oxygen Re-Evaluation:   Oxygen Discharge (Final Oxygen Re-Evaluation):   Initial Exercise Prescription:  Initial Exercise Prescription - 04/18/23 1200       Date of Initial Exercise RX and Referring Provider   Date 04/18/23    Referring Provider Mannam    Expected Discharge Date 07/14/23      Recumbant Bike   Level 1    Minutes 15    METs 2      NuStep   Level 1    SPM 60    Minutes 15    METs 2      Prescription Details   Frequency (times per week) 2    Duration Progress to 30 minutes of continuous aerobic without signs/symptoms of physical distress      Intensity   THRR 40-80% of Max Heartrate 59-118    Ratings of Perceived Exertion 11-13    Perceived Dyspnea 0-4      Progression   Progression Continue to progress workloads to maintain intensity without signs/symptoms of physical distress.       Resistance Training   Training Prescription Yes    Weight blue bands    Reps 10-15             Perform Capillary Blood Glucose checks as needed.  Exercise Prescription Changes:   Exercise Prescription Changes     Row Name 04/26/23 1100             Response to Exercise   Blood Pressure (Admit) 120/74       Blood Pressure (Exercise) 146/70       Blood Pressure (Exit) 114/68       Heart Rate (Admit) 91 bpm       Heart Rate (Exercise) 113 bpm       Heart Rate (Exit) 99 bpm       Oxygen Saturation (Admit) 98 %       Oxygen Saturation (Exercise) 93 %       Oxygen Saturation (Exit) 97 %       Rating of Perceived Exertion (Exercise) 13       Perceived Dyspnea (Exercise) 1       Duration Progress to 30 minutes of  aerobic without signs/symptoms of physical distress       Intensity THRR unchanged         Progression   Progression Continue to progress workloads to maintain intensity without signs/symptoms of physical distress.         Resistance Training   Training Prescription Yes       Weight blue bands       Reps 10-15       Time 10 Minutes         Recumbant Bike   Level 1       RPM 55       Minutes 15       METs 1.9         NuStep   Level 2       SPM 83       Minutes 15       METs 2.8  Exercise Comments:   Exercise Comments     Row Name 04/26/23 1151           Exercise Comments Pt completed first day of exercise. He exercised for 15 min on the recumbent bike and Nustep. Ademola averged 1.9 METs at level 1 on the recumbent bike and 2.8 METs at level 2 on the Nustep. He performed the warmup and cooldown standing without limitations. Discussed METs.                Exercise Goals and Review:   Exercise Goals     Row Name 04/18/23 1045             Exercise Goals   Increase Physical Activity Yes       Intervention Provide advice, education, support and counseling about physical activity/exercise needs.;Develop an  individualized exercise prescription for aerobic and resistive training based on initial evaluation findings, risk stratification, comorbidities and participant's personal goals.       Expected Outcomes Short Term: Attend rehab on a regular basis to increase amount of physical activity.;Long Term: Exercising regularly at least 3-5 days a week.;Long Term: Add in home exercise to make exercise part of routine and to increase amount of physical activity.       Increase Strength and Stamina Yes       Intervention Provide advice, education, support and counseling about physical activity/exercise needs.;Develop an individualized exercise prescription for aerobic and resistive training based on initial evaluation findings, risk stratification, comorbidities and participant's personal goals.       Expected Outcomes Short Term: Increase workloads from initial exercise prescription for resistance, speed, and METs.;Short Term: Perform resistance training exercises routinely during rehab and add in resistance training at home;Long Term: Improve cardiorespiratory fitness, muscular endurance and strength as measured by increased METs and functional capacity ( )       Able to understand and use rate of perceived exertion (RPE) scale Yes       Intervention Provide education and explanation on how to use RPE scale       Expected Outcomes Short Term: Able to use RPE daily in rehab to express subjective intensity level;Long Term:  Able to use RPE to guide intensity level when exercising independently       Able to understand and use Dyspnea scale Yes       Intervention Provide education and explanation on how to use Dyspnea scale       Expected Outcomes Short Term: Able to use Dyspnea scale daily in rehab to express subjective sense of shortness of breath during exertion;Long Term: Able to use Dyspnea scale to guide intensity level when exercising independently       Knowledge and understanding of Target Heart Rate Range  (THRR) Yes       Intervention Provide education and explanation of THRR including how the numbers were predicted and where they are located for reference       Expected Outcomes Short Term: Able to state/look up THRR;Short Term: Able to use daily as guideline for intensity in rehab;Long Term: Able to use THRR to govern intensity when exercising independently       Understanding of Exercise Prescription Yes       Intervention Provide education, explanation, and written materials on patient's individual exercise prescription       Expected Outcomes Short Term: Able to explain program exercise prescription;Long Term: Able to explain home exercise prescription to exercise independently  Exercise Goals Re-Evaluation :  Exercise Goals Re-Evaluation     Row Name 04/27/23 0823             Exercise Goal Re-Evaluation   Exercise Goals Review Increase Physical Activity;Able to understand and use Dyspnea scale;Understanding of Exercise Prescription;Increase Strength and Stamina;Knowledge and understanding of Target Heart Rate Range (THRR);Able to understand and use rate of perceived exertion (RPE) scale       Comments Zeeshan has completed 1 exercise sessions. He exercises for 15 min on the recumbent bike and Nustep. Jie averges 1.9 METs at level 1 on the recumbent bike and 2.8 METs at level 2 on the Nustep. He performed the warmup and cooldown standing without limitations. It is too soon to notate any discernable progressions. Will continue to monitor and progress as able.       Expected Outcomes Through exercise at rehab and home, the patient will decrease shortness of breath with daily activities and feel confident in carrying out an exercise regimen at home.                Discharge Exercise Prescription (Final Exercise Prescription Changes):  Exercise Prescription Changes - 04/26/23 1100       Response to Exercise   Blood Pressure (Admit) 120/74    Blood Pressure  (Exercise) 146/70    Blood Pressure (Exit) 114/68    Heart Rate (Admit) 91 bpm    Heart Rate (Exercise) 113 bpm    Heart Rate (Exit) 99 bpm    Oxygen Saturation (Admit) 98 %    Oxygen Saturation (Exercise) 93 %    Oxygen Saturation (Exit) 97 %    Rating of Perceived Exertion (Exercise) 13    Perceived Dyspnea (Exercise) 1    Duration Progress to 30 minutes of  aerobic without signs/symptoms of physical distress    Intensity THRR unchanged      Progression   Progression Continue to progress workloads to maintain intensity without signs/symptoms of physical distress.      Resistance Training   Training Prescription Yes    Weight blue bands    Reps 10-15    Time 10 Minutes      Recumbant Bike   Level 1    RPM 55    Minutes 15    METs 1.9      NuStep   Level 2    SPM 83    Minutes 15    METs 2.8             Nutrition:  Target Goals: Understanding of nutrition guidelines, daily intake of sodium 1500mg , cholesterol 200mg , calories 30% from fat and 7% or less from saturated fats, daily to have 5 or more servings of fruits and vegetables.  Biometrics:    Nutrition Therapy Plan and Nutrition Goals:   Nutrition Assessments:  MEDIFICTS Score Key: >=70 Need to make dietary changes  40-70 Heart Healthy Diet <= 40 Therapeutic Level Cholesterol Diet   Picture Your Plate Scores: <47 Unhealthy dietary pattern with much room for improvement. 41-50 Dietary pattern unlikely to meet recommendations for good health and room for improvement. 51-60 More healthful dietary pattern, with some room for improvement.  >60 Healthy dietary pattern, although there may be some specific behaviors that could be improved.    Nutrition Goals Re-Evaluation:   Nutrition Goals Discharge (Final Nutrition Goals Re-Evaluation):   Psychosocial: Target Goals: Acknowledge presence or absence of significant depression and/or stress, maximize coping skills, provide positive support system.  Participant is able  to verbalize types and ability to use techniques and skills needed for reducing stress and depression.  Initial Review & Psychosocial Screening:  Initial Psych Review & Screening - 04/18/23 1051       Initial Review   Current issues with None Identified      Family Dynamics   Good Support System? Yes      Barriers   Psychosocial barriers to participate in program There are no identifiable barriers or psychosocial needs.      Screening Interventions   Interventions Provide feedback about the scores to participant             Quality of Life Scores:  Scores of 19 and below usually indicate a poorer quality of life in these areas.  A difference of  2-3 points is a clinically meaningful difference.  A difference of 2-3 points in the total score of the Quality of Life Index has been associated with significant improvement in overall quality of life, self-image, physical symptoms, and general health in studies assessing change in quality of life.  PHQ-9: Review Flowsheet  More data exists      04/18/2023 04/18/2015 02/28/2015 02/21/2015 02/06/2015  Depression screen PHQ 2/9  Decreased Interest 0 0 0 0 0  Down, Depressed, Hopeless 0 0 0 0 0  PHQ - 2 Score 0 0 0 0 0  Altered sleeping 1 - - - -  Tired, decreased energy 1 - - - -  Change in appetite 0 - - - -  Feeling bad or failure about yourself  0 - - - -  Trouble concentrating 0 - - - -  Moving slowly or fidgety/restless 0 - - - -  Suicidal thoughts 0 - - - -  PHQ-9 Score 2 - - - -  Difficult doing work/chores Not difficult at all - - - -    Details           Interpretation of Total Score  Total Score Depression Severity:  1-4 = Minimal depression, 5-9 = Mild depression, 10-14 = Moderate depression, 15-19 = Moderately severe depression, 20-27 = Severe depression   Psychosocial Evaluation and Intervention:  Psychosocial Evaluation - 04/18/23 1051       Psychosocial Evaluation & Interventions    Interventions Encouraged to exercise with the program and follow exercise prescription    Comments Onis denies any barriers or psy/soc concerns at this time    Expected Outcomes For Channer to exercise in Pulm Rehab without any barriers    Continue Psychosocial Services  No Follow up required             Psychosocial Re-Evaluation:  Psychosocial Re-Evaluation     Row Name 04/22/23 1351             Psychosocial Re-Evaluation   Current issues with None Identified       Comments Vince has not started the program yet. No new psychosocial barriers or concerns since orientation on 04/18/23.       Expected Outcomes For Vimal to participate in PR free of any psychosocial barriers or concerns       Interventions Encouraged to attend Pulmonary Rehabilitation for the exercise       Continue Psychosocial Services  No Follow up required                Psychosocial Discharge (Final Psychosocial Re-Evaluation):  Psychosocial Re-Evaluation - 04/22/23 1351       Psychosocial Re-Evaluation   Current issues with None Identified  Comments Domnique has not started the program yet. No new psychosocial barriers or concerns since orientation on 04/18/23.    Expected Outcomes For Adonys to participate in PR free of any psychosocial barriers or concerns    Interventions Encouraged to attend Pulmonary Rehabilitation for the exercise    Continue Psychosocial Services  No Follow up required             Education: Education Goals: Education classes will be provided on a weekly basis, covering required topics. Participant will state understanding/return demonstration of topics presented.  Learning Barriers/Preferences:   Education Topics: Know Your Numbers Group instruction that is supported by a PowerPoint presentation. Instructor discusses importance of knowing and understanding resting, exercise, and post-exercise oxygen saturation, heart rate, and blood pressure. Oxygen  saturation, heart rate, blood pressure, rating of perceived exertion, and dyspnea are reviewed along with a normal range for these values.    Exercise for the Pulmonary Patient Group instruction that is supported by a PowerPoint presentation. Instructor discusses benefits of exercise, core components of exercise, frequency, duration, and intensity of an exercise routine, importance of utilizing pulse oximetry during exercise, safety while exercising, and options of places to exercise outside of rehab.    MET Level  Group instruction provided by PowerPoint, verbal discussion, and written material to support subject matter. Instructor reviews what METs are and how to increase METs.    Pulmonary Medications Verbally interactive group education provided by instructor with focus on inhaled medications and proper administration.   Anatomy and Physiology of the Respiratory System Group instruction provided by PowerPoint, verbal discussion, and written material to support subject matter. Instructor reviews respiratory cycle and anatomical components of the respiratory system and their functions. Instructor also reviews differences in obstructive and restrictive respiratory diseases with examples of each.    Oxygen Safety Group instruction provided by PowerPoint, verbal discussion, and written material to support subject matter. There is an overview of "What is Oxygen" and "Why do we need it".  Instructor also reviews how to create a safe environment for oxygen use, the importance of using oxygen as prescribed, and the risks of noncompliance. There is a brief discussion on traveling with oxygen and resources the patient may utilize.   Oxygen Use Group instruction provided by PowerPoint, verbal discussion, and written material to discuss how supplemental oxygen is prescribed and different types of oxygen supply systems. Resources for more information are provided.    Breathing Techniques Group  instruction that is supported by demonstration and informational handouts. Instructor discusses the benefits of pursed lip and diaphragmatic breathing and detailed demonstration on how to perform both.     Risk Factor Reduction Group instruction that is supported by a PowerPoint presentation. Instructor discusses the definition of a risk factor, different risk factors for pulmonary disease, and how the heart and lungs work together.   Pulmonary Diseases Group instruction provided by PowerPoint, verbal discussion, and written material to support subject matter. Instructor gives an overview of the different type of pulmonary diseases. There is also a discussion on risk factors and symptoms as well as ways to manage the diseases.   Stress and Energy Conservation Group instruction provided by PowerPoint, verbal discussion, and written material to support subject matter. Instructor gives an overview of stress and the impact it can have on the body. Instructor also reviews ways to reduce stress. There is also a discussion on energy conservation and ways to conserve energy throughout the day.   Warning Signs and Symptoms Group instruction  provided by PowerPoint, verbal discussion, and written material to support subject matter. Instructor reviews warning signs and symptoms of stroke, heart attack, cold and flu. Instructor also reviews ways to prevent the spread of infection.   Other Education Group or individual verbal, written, or video instructions that support the educational goals of the pulmonary rehab program.    Knowledge Questionnaire Score:  Knowledge Questionnaire Score - 04/18/23 1110       Knowledge Questionnaire Score   Pre Score 12/18             Core Components/Risk Factors/Patient Goals at Admission:  Personal Goals and Risk Factors at Admission - 04/18/23 1052       Core Components/Risk Factors/Patient Goals on Admission   Tobacco Cessation Yes    Intervention  Assist the participant in steps to quit. Provide individualized education and counseling about committing to Tobacco Cessation, relapse prevention, and pharmacological support that can be provided by physician.;Education officer, environmental, assist with locating and accessing local/national Quit Smoking programs, and support quit date choice.    Expected Outcomes Short Term: Will demonstrate readiness to quit, by selecting a quit date.;Long Term: Complete abstinence from all tobacco products for at least 12 months from quit date.;Short Term: Will quit all tobacco product use, adhering to prevention of relapse plan.    Improve shortness of breath with ADL's Yes    Intervention Provide education, individualized exercise plan and daily activity instruction to help decrease symptoms of SOB with activities of daily living.    Expected Outcomes Short Term: Improve cardiorespiratory fitness to achieve a reduction of symptoms when performing ADLs;Long Term: Be able to perform more ADLs without symptoms or delay the onset of symptoms    Increase knowledge of respiratory medications and ability to use respiratory devices properly  Yes    Intervention Provide education and demonstration as needed of appropriate use of medications, inhalers, and oxygen therapy.    Expected Outcomes Short Term: Achieves understanding of medications use. Understands that oxygen is a medication prescribed by physician. Demonstrates appropriate use of inhaler and oxygen therapy.;Long Term: Maintain appropriate use of medications, inhalers, and oxygen therapy.    Diabetes Yes    Intervention Provide education about signs/symptoms and action to take for hypo/hyperglycemia.;Provide education about proper nutrition, including hydration, and aerobic/resistive exercise prescription along with prescribed medications to achieve blood glucose in normal ranges: Fasting glucose 65-99 mg/dL    Expected Outcomes Short Term: Participant verbalizes  understanding of the signs/symptoms and immediate care of hyper/hypoglycemia, proper foot care and importance of medication, aerobic/resistive exercise and nutrition plan for blood glucose control.             Core Components/Risk Factors/Patient Goals Review:   Goals and Risk Factor Review     Row Name 04/22/23 1353 04/26/23 1150           Core Components/Risk Factors/Patient Goals Review   Personal Goals Review Tobacco Cessation;Improve shortness of breath with ADL's;Develop more efficient breathing techniques such as purse lipped breathing and diaphragmatic breathing and practicing self-pacing with activity.;Increase knowledge of respiratory medications and ability to use respiratory devices properly.;Diabetes Tobacco Cessation      Review Kienan is scheduled to start PR on 11/26. Goal progressing for tobacco cessation. Goal progressing for improving shortness of breath with ADL's. Goal progressing for developing more efficient breathing techniques such as purse lipped breathing and diaphragmatic breathing; and practicing self-pacing with activity. Goal progressing for increase knowledge or respiratory medications and ability to use respiratory devices  properly. Goal progressing for diabetes. Discussed smoking cessation with pt today. He sts he is on chantix and nicotine patch currently and is losing his taste for smoking. He sts he is cutting down on his cigarettes and the last cigarette he had was 12 pm yesterday. Congratulated pt on not smoking for 24 hours. Discussed tips for distraction/substition. Gave pt Quitting Smoking resource sheet. Encouraged him to talk with [place] for questions or concerns.      Expected Outcomes To quit smoking, improve shortness of breath with ADL's, develop more efficient breathing techniques such as purse lipped breathing and diaphragmatic breathing; and practicing self-pacing with activity, increase knowledge or respiratory medications and ability to use  respiratory devices properly and have better control of his diabetes. To quit smoking completely.               Core Components/Risk Factors/Patient Goals at Discharge (Final Review):   Goals and Risk Factor Review - 04/26/23 1150       Core Components/Risk Factors/Patient Goals Review   Personal Goals Review Tobacco Cessation    Review Discussed smoking cessation with pt today. He sts he is on chantix and nicotine patch currently and is losing his taste for smoking. He sts he is cutting down on his cigarettes and the last cigarette he had was 12 pm yesterday. Congratulated pt on not smoking for 24 hours. Discussed tips for distraction/substition. Gave pt Quitting Smoking resource sheet. Encouraged him to talk with [place] for questions or concerns.    Expected Outcomes To quit smoking completely.             ITP Comments: Pt is making expected progress toward Pulmonary Rehab goals after completing 1 session(s). Recommend continued exercise, life style modification, education, and utilization of breathing techniques to increase stamina and strength, while also decreasing shortness of breath with exertion.  Dr. Mechele Collin is Medical Director for Pulmonary Rehab at [facility].

## 2023-05-03 ENCOUNTER — Encounter (HOSPITAL_COMMUNITY)
Admission: RE | Admit: 2023-05-03 | Discharge: 2023-05-03 | Disposition: A | Discharge status: Home / Self Care | Payer: 59 | Source: Ambulatory Visit | Attending: Pulmonary Disease | Admitting: Pulmonary Disease

## 2023-05-03 DIAGNOSIS — J449 Chronic obstructive pulmonary disease, unspecified: Secondary | ICD-10-CM | POA: Insufficient documentation

## 2023-05-03 DIAGNOSIS — Z85118 Personal history of other malignant neoplasm of bronchus and lung: Secondary | ICD-10-CM | POA: Diagnosis not present

## 2023-05-03 DIAGNOSIS — Z87891 Personal history of nicotine dependence: Secondary | ICD-10-CM | POA: Diagnosis not present

## 2023-05-03 LAB — GLUCOSE, CAPILLARY
Glucose-Capillary: 101 mg/dL — ABNORMAL HIGH (ref 70–99)
Glucose-Capillary: 86 mg/dL (ref 70–99)

## 2023-05-03 NOTE — Progress Notes (Signed)
Daily Session Note  Patient Details  Name: CARMINO ZIRKELBACH MRN: [id number] Date of Birth: 1950/12/14 Referring Provider:   Doristine Devoid Pulmonary Rehab Walk Test from 04/18/2023 in [facility]  Referring Provider Mannam       Encounter Date: 05/03/2023  Check In:  Session Check In - 05/03/23 1118       Check-In   Supervising physician immediately available to respond to emergencies [facility] MD immediately available    Physician(s) Eligha Bridegroom, NP    Location [facility]    Staff Present Essie Hart, RN, Doris Cheadle, MS, ACSM-CEP, Exercise Physiologist;Randi Idelle Crouch BS, ACSM-CEP, Exercise Physiologist;David Makemson, MS, ACSM-CEP, CCRP, Exercise Physiologist    Virtual Visit No    Medication changes reported     No    Fall or balance concerns reported    No    Tobacco Cessation No Change    Warm-up and Cool-down Performed as group-led instruction    Resistance Training Performed Yes    VAD Patient? No    PAD/SET Patient? No      Pain Assessment   Currently in Pain? No/denies    Multiple Pain Sites No             Capillary Blood Glucose: Results for orders placed or performed during the hospital encounter of 04/26/23 (from the past 24 hour(s))  Glucose, capillary     Status: Abnormal   Collection Time: 05/03/23 10:41 AM  Result Value Ref Range   Glucose-Capillary 101 (H) 70 - 99 mg/dL  Glucose, capillary     Status: None   Collection Time: 05/03/23 11:39 AM  Result Value Ref Range   Glucose-Capillary 86 70 - 99 mg/dL      Social History   Tobacco Use  Smoking Status Every Day   Current packs/day: 0.50   Average packs/day: 0.5 packs/day for 53.0 years (26.5 ttl pk-yrs)   Types: Cigarettes  Smokeless Tobacco Never  Tobacco Comments   Smoking 1 ppd.  Hfb 04/20/2023    Goals Met:  Exercise tolerated well No report of concerns or symptoms today Strength training completed  today  Goals Unmet:  Not Applicable  Comments: Service time is from 1016 to 1152    Dr. Mechele Collin is Medical Director for Pulmonary Rehab at [facility].

## 2023-05-04 ENCOUNTER — Telehealth (HOSPITAL_COMMUNITY): Payer: Self-pay

## 2023-05-04 NOTE — Telephone Encounter (Signed)
-----   Message from [facility] sent at 05/04/2023  9:41 AM EST ----- Regarding: RE: Target HR increase in Pulm Rehab Ok to increase HR to 135 ----- Message ----- From: Essie Hart, RN Sent: 05/03/2023  12:27 PM EST To: Chilton Greathouse, MD Subject: Target HR increase in Pulm Rehab               Can we please get an increase in Larry [name]'s target heart rate in pulmonary rehab from 118 to 135?

## 2023-05-05 ENCOUNTER — Encounter (HOSPITAL_COMMUNITY)
Admission: RE | Admit: 2023-05-05 | Discharge: 2023-05-05 | Disposition: A | Discharge status: Home / Self Care | Payer: 59 | Source: Ambulatory Visit | Attending: Pulmonary Disease | Admitting: Pulmonary Disease

## 2023-05-05 VITALS — Wt 196.4 lb

## 2023-05-05 DIAGNOSIS — J449 Chronic obstructive pulmonary disease, unspecified: Secondary | ICD-10-CM

## 2023-05-05 NOTE — Progress Notes (Signed)
Daily Session Note  Patient Details  Name: Larry [name] MRN: [id number] Date of Birth: 1951-01-11 Referring Provider:   Doristine Devoid Pulmonary Rehab Walk Test from 04/18/2023 in [facility]  Referring Provider Mannam       Encounter Date: 05/05/2023  Check In:  Session Check In - 05/05/23 1022       Check-In   Supervising physician immediately available to respond to emergencies [facility] MD immediately available    Physician(s) Edd Fabian, NP    Location [facility]    Staff Present Essie Hart, RN, Doris Cheadle, MS, ACSM-CEP, Exercise Physiologist;Randi Dionisio Paschal, ACSM-CEP, Exercise Physiologist;Casey Katrinka Blazing, RT    Virtual Visit No    Medication changes reported     No    Fall or balance concerns reported    No    Tobacco Cessation No Change    Warm-up and Cool-down Performed as group-led instruction    Resistance Training Performed Yes    VAD Patient? No    PAD/SET Patient? No      Pain Assessment   Currently in Pain? No/denies    Multiple Pain Sites No             Capillary Blood Glucose: No results found for this or any previous visit (from the past 24 hour(s)).    Social History   Tobacco Use  Smoking Status Every Day   Current packs/day: 0.50   Average packs/day: 0.5 packs/day for 53.0 years (26.5 ttl pk-yrs)   Types: Cigarettes  Smokeless Tobacco Never  Tobacco Comments   Smoking 1 ppd.  Hfb 04/20/2023    Goals Met:  Exercise tolerated well No report of concerns or symptoms today Strength training completed today  Goals Unmet:  Not Applicable  Comments: Service time is from 1012 to 1144    Dr. Mechele Collin is Medical Director for Pulmonary Rehab at [facility].

## 2023-05-10 ENCOUNTER — Encounter (HOSPITAL_COMMUNITY)
Admission: RE | Admit: 2023-05-10 | Discharge: 2023-05-10 | Disposition: A | Discharge status: Home / Self Care | Payer: 59 | Source: Ambulatory Visit | Attending: Pulmonary Disease | Admitting: Pulmonary Disease

## 2023-05-10 DIAGNOSIS — J449 Chronic obstructive pulmonary disease, unspecified: Secondary | ICD-10-CM

## 2023-05-10 NOTE — Progress Notes (Deleted)
Daily Session Note  Patient Details  Name: Larry [name] MRN: [id number] Date of Birth: 12/16/1950 Referring Provider:   Doristine Devoid Pulmonary Rehab Walk Test from 04/18/2023 in [facility]  Referring Provider Mannam       Encounter Date: 05/05/2023  Check In:   Capillary Blood Glucose: No results found for this or any previous visit (from the past 24 hour(s)).   Exercise Prescription Changes - 05/10/23 1200       Response to Exercise   Blood Pressure (Admit) 142/70    Blood Pressure (Exercise) 160/70    Blood Pressure (Exit) 118/68    Heart Rate (Admit) 88 bpm    Heart Rate (Exercise) 120 bpm    Heart Rate (Exit) 98 bpm    Oxygen Saturation (Admit) 98 %    Oxygen Saturation (Exercise) 94 %    Oxygen Saturation (Exit) 98 %    Rating of Perceived Exertion (Exercise) 13    Perceived Dyspnea (Exercise) 1    Duration Continue with 30 min of aerobic exercise without signs/symptoms of physical distress.    Intensity THRR unchanged      Progression   Progression Continue to progress workloads to maintain intensity without signs/symptoms of physical distress.      Resistance Training   Training Prescription Yes    Weight blue bands    Reps 10-15    Time 10 Minutes      Recumbant Bike   Level 3    Minutes 15    METs 2.8      NuStep   Level 2    Minutes 15    METs 2.8             Social History   Tobacco Use  Smoking Status Every Day   Current packs/day: 0.50   Average packs/day: 0.5 packs/day for 53.0 years (26.5 ttl pk-yrs)   Types: Cigarettes  Smokeless Tobacco Never  Tobacco Comments   Smoking 1 ppd.  Hfb 04/20/2023    Goals Met:  Proper associated with RPD/PD & O2 Sat Independence with exercise equipment Exercise tolerated well No report of concerns or symptoms today Strength training completed today  Goals Unmet:  Not Applicable  Comments: Service time is from 1012 to  1145.    Dr. Mechele Collin is Medical Director for Pulmonary Rehab at [facility].

## 2023-05-10 NOTE — Progress Notes (Signed)
Daily Session Note  Patient Details  Name: Larry [name] MRN: [id number] Date of Birth: 16-Jul-1950 Referring Provider:   Doristine Devoid Pulmonary Rehab Walk Test from 04/18/2023 in [facility]  Referring Provider Mannam       Encounter Date: 05/10/2023  Check In:  Session Check In - 05/10/23 1037       Check-In   Supervising physician immediately available to respond to emergencies [facility] MD immediately available    Physician(s) Carlos Levering, NP    [facility]    Staff Present Essie Hart, RN, Doris Cheadle, MS, ACSM-CEP, Exercise Physiologist;Randi Dionisio Paschal, ACSM-CEP, Exercise Physiologist;Morrissa Shein Katrinka Blazing, RT    Virtual Visit No    Medication changes reported     No    Fall or balance concerns reported    No    Tobacco Cessation No Change    Warm-up and Cool-down Performed as group-led instruction    Resistance Training Performed Yes    VAD Patient? No    PAD/SET Patient? No      Pain Assessment   Currently in Pain? No/denies    Multiple Pain Sites No             Capillary Blood Glucose: No results found for this or any previous visit (from the past 24 hour(s)).    Social History   Tobacco Use  Smoking Status Every Day   Current packs/day: 0.50   Average packs/day: 0.5 packs/day for 53.0 years (26.5 ttl pk-yrs)   Types: Cigarettes  Smokeless Tobacco Never  Tobacco Comments   Smoking 1 ppd.  Hfb 04/20/2023    Goals Met:  Proper associated with RPD/PD & O2 Sat Independence with exercise equipment Exercise tolerated well No report of concerns or symptoms today Strength training completed today  Goals Unmet:  Not Applicable  Comments: Service time is from 1012 to 1145.    Dr. Mechele Collin is Medical Director for Pulmonary Rehab at [facility].

## 2023-05-12 ENCOUNTER — Encounter (HOSPITAL_COMMUNITY)
Admission: RE | Admit: 2023-05-12 | Discharge: 2023-05-12 | Disposition: A | Discharge status: Home / Self Care | Payer: 59 | Source: Ambulatory Visit | Attending: Pulmonary Disease | Admitting: Pulmonary Disease

## 2023-05-12 DIAGNOSIS — J449 Chronic obstructive pulmonary disease, unspecified: Secondary | ICD-10-CM

## 2023-05-12 NOTE — Progress Notes (Signed)
Daily Session Note  Patient Details  Name: Larry [name] MRN: [id number] Date of Birth: 10-21-1950 Referring Provider:   Doristine Devoid Pulmonary Rehab Walk Test from 04/18/2023 in [facility]  Referring Provider Mannam       Encounter Date: 05/12/2023  Check In:  Session Check In - 05/12/23 1021       Check-In   Supervising physician immediately available to respond to emergencies [facility] MD immediately available    Physician(s) Joni Reining, NP    [facility]    Staff Present Essie Hart, RN, Doris Cheadle, MS, ACSM-CEP, Exercise Physiologist;Marcellius Montagna Dionisio Paschal, ACSM-CEP, Exercise Physiologist;Casey Katrinka Blazing, RT    Virtual Visit No    Medication changes reported     No    Fall or balance concerns reported    No    Tobacco Cessation No Change    Warm-up and Cool-down Performed as group-led instruction    Resistance Training Performed Yes    VAD Patient? No    PAD/SET Patient? No      Pain Assessment   Currently in Pain? No/denies    Multiple Pain Sites No             Capillary Blood Glucose: No results found for this or any previous visit (from the past 24 hours).    Social History   Tobacco Use  Smoking Status Every Day   Current packs/day: 0.50   Average packs/day: 0.5 packs/day for 53.0 years (26.5 ttl pk-yrs)   Types: Cigarettes  Smokeless Tobacco Never  Tobacco Comments   Smoking 1 ppd.  Hfb 04/20/2023    Goals Met:  Exercise tolerated well No report of concerns or symptoms today Strength training completed today  Goals Unmet:  Not Applicable  Comments: Service time is from 1009 to 1135.    Dr. Mechele Collin is Medical Director for Pulmonary Rehab at [facility].

## 2023-05-17 ENCOUNTER — Encounter (HOSPITAL_COMMUNITY)
Admission: RE | Admit: 2023-05-17 | Discharge: 2023-05-17 | Disposition: A | Discharge status: Home / Self Care | Payer: 59 | Source: Ambulatory Visit | Attending: Pulmonary Disease

## 2023-05-17 DIAGNOSIS — J449 Chronic obstructive pulmonary disease, unspecified: Secondary | ICD-10-CM

## 2023-05-17 NOTE — Progress Notes (Signed)
Daily Session Note  Patient Details  Name: Larry [name] MRN: [id number] Date of Birth: 1951/03/27 Referring Provider:   Doristine Devoid Pulmonary Rehab Walk Test from 04/18/2023 in [facility]  Referring Provider Mannam       Encounter Date: 05/17/2023  Check In:  Session Check In - 05/17/23 1112       Check-In   Supervising physician immediately available to respond to emergencies [facility] MD immediately available    Physician(s) Bernadene Person, NP    [facility]    Staff Present Essie Hart, RN, Doris Cheadle, MS, ACSM-CEP, Exercise Physiologist;Quantavia Frith Dionisio Paschal, ACSM-CEP, Exercise Physiologist;Casey Katrinka Blazing, RT    Virtual Visit No    Medication changes reported     No    Fall or balance concerns reported    No    Tobacco Cessation No Change    Warm-up and Cool-down Performed as group-led instruction    Resistance Training Performed Yes    VAD Patient? No    PAD/SET Patient? No      Pain Assessment   Currently in Pain? No/denies    Multiple Pain Sites No             Capillary Blood Glucose: No results found for this or any previous visit (from the past 24 hours).    Social History   Tobacco Use  Smoking Status Every Day   Current packs/day: 0.50   Average packs/day: 0.5 packs/day for 53.0 years (26.5 ttl pk-yrs)   Types: Cigarettes  Smokeless Tobacco Never  Tobacco Comments   Smoking 1 ppd.  Hfb 04/20/2023    Goals Met:  Independence with exercise equipment Exercise tolerated well No report of concerns or symptoms today Strength training completed today  Goals Unmet:  Not Applicable  Comments: Service time is from 1011 to 1140.    Dr. Mechele Collin is Medical Director for Pulmonary Rehab at [facility].

## 2023-05-18 ENCOUNTER — Ambulatory Visit: Payer: 59 | Attending: Cardiology | Admitting: Cardiology

## 2023-05-18 ENCOUNTER — Encounter: Payer: Self-pay | Admitting: Cardiology

## 2023-05-18 VITALS — BP 132/74 | HR 78 | Resp 16 | Ht 71.0 in | Wt 192.2 lb

## 2023-05-18 DIAGNOSIS — I739 Peripheral vascular disease, unspecified: Secondary | ICD-10-CM | POA: Diagnosis not present

## 2023-05-18 DIAGNOSIS — I10 Essential (primary) hypertension: Secondary | ICD-10-CM | POA: Diagnosis not present

## 2023-05-18 DIAGNOSIS — E7800 Pure hypercholesterolemia, unspecified: Secondary | ICD-10-CM | POA: Diagnosis not present

## 2023-05-18 DIAGNOSIS — I2510 Atherosclerotic heart disease of native coronary artery without angina pectoris: Secondary | ICD-10-CM | POA: Diagnosis not present

## 2023-05-18 DIAGNOSIS — Z720 Tobacco use: Secondary | ICD-10-CM

## 2023-05-18 MED ORDER — EZETIMIBE 10 MG PO TABS: 10.0000 mg | ORAL_TABLET | Freq: Every day | ORAL | 0 refills | Status: AC

## 2023-05-18 NOTE — Progress Notes (Signed)
Cardiology Office Note:  .   Date:  05/18/2023  ID:  Larry [name], DOB 12-26-1950, MRN [id number] PCP: Charlane Ferretti, DO  [facility] Providers Cardiologist:  Yates Decamp, MD   History of Present Illness: .   Larry [name] is a 72 y.o. 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   Discussed the use of AI scribe software for clinical note transcription with the patient, who gave verbal consent to proceed.  History of Present Illness   The patient, with a history of heart disease, presents for a routine cardiology follow-up after receiving a reminder letter. He reports no current chest pain or discomfort. He lives alone in [name] apartment and has recently quit smoking in November. He reports no cramping or discomfort in his legs during physical activity or at rest. He is currently undergoing physical therapy at a pulmonary rehab center, where he performs exercises at varying levels without any reported issues except for shortness of breath. The patient's cholesterol levels are not at the desired level despite being on the highest dose of Crestor. He also has plaque buildup in his heart arteries and reduced blood flow in his right leg.      Review of Systems  Cardiovascular:  Positive for dyspnea on exertion (chronic stable). Negative for chest pain, claudication and leg swelling.  Respiratory:  Positive for cough (chronic) and wheezing.    Labs   Lab Results  Component Value Date   CHOL 215 (H) 03/30/2019   HDL 47 03/30/2019   LDLCALC 142 (H) 03/30/2019   TRIG 144 03/30/2019   CHOLHDL 4.6 03/30/2019   Lab Results  Component Value Date   NA 139 12/14/2022   K 4.5 12/14/2022   CO2 29 12/14/2022   GLUCOSE 102 (H) 12/14/2022   BUN 6 (L) 12/14/2022   CREATININE 0.76 12/14/2022    CALCIUM 9.6 12/14/2022   GFR 118.23 03/18/2014   EGFR >60 05/25/2017   GFRNONAA >60 12/14/2022      Latest Ref Rng & Units 12/14/2022    9:46 AM 12/15/2021   11:15 AM 12/15/2020   11:01 AM  BMP  Glucose 70 - 99 mg/dL 427  062  376   BUN 8 - 23 mg/dL 6  12  9    Creatinine 0.61 - 1.24 mg/dL 2.83  1.51  7.61   Sodium 135 - 145 mmol/L 139  137  140   Potassium 3.5 - 5.1 mmol/L 4.5  4.1  4.0   Chloride 98 - 111 mmol/L 104  103  105   CO2 22 - 32 mmol/L 29  27  24    Calcium 8.9 - 10.3 mg/dL 9.6  9.5  9.6       Latest Ref Rng & Units 01/20/2023   10:38 AM 12/14/2022    9:46 AM 12/15/2021   11:15 AM  CBC  WBC 4.0 - 10.5 K/uL 7.6  7.9  9.2   Hemoglobin 13.0 - 17.0 g/dL 60.7  37.1  06.2   Hematocrit 39.0 - 52.0 % 43.9  41.6  38.4   Platelets 150.0 - 400.0 K/uL 198.0  181  245    External Labs:  Labs 04/14/2023:  Total cholesterol 169, triglycerides 90, HDL 59, LDL 92.  A1c 6.6%.  TSH normal at 1.910.  Physical Exam:   VS:  BP 132/74 (BP  Location: Left Arm, Patient Position: Sitting)   Pulse 78   Resp 16   Ht 5\' 11"  (1.803 m)   Wt 192 lb 3.2 oz (87.2 kg)   SpO2 97%   BMI 26.81 kg/m    Wt Readings from Last 3 Encounters:  05/18/23 192 lb 3.2 oz (87.2 kg)  05/10/23 196 lb 6.9 oz (89.1 kg)  04/26/23 191 lb 12.8 oz (87 kg)     Physical Exam Neck:     Vascular: No carotid bruit or JVD.  Cardiovascular:     Rate and Rhythm: Normal rate and regular rhythm.     Pulses: Intact distal pulses.          Popliteal pulses are 1+ on the right side and 2+ on the left side.       Dorsalis pedis pulses are 0 on the right side and 1+ on the left side.       Posterior tibial pulses are 0 on the right side and 2+ on the left side.     Heart sounds: Normal heart sounds. No murmur heard.    No gallop.  Pulmonary:     Effort: Pulmonary effort is normal.     Breath sounds: Decreased air movement (bases) present. Rhonchi (scattered) present.  Abdominal:     General: Bowel sounds are  normal.     Palpations: Abdomen is soft.  Musculoskeletal:     Right lower leg: No edema.     Left lower leg: No edema.     Studies Reviewed: Marland Kitchen    Abdominal Aortic Duplex  03/20/2019:  The maximum aorta (sac) diameter is 2.79 cm (dist). Moderate plaque  observed in the distal aorta. Mild plaque observed in the proximal and mid  aorta. Normal flow velocities noted in the abdominal aorta and internal  iliac arteries bilaterally.  [name] abdominal aortic aneurysm measuring 2.71 x 2.73 x 2.79 cm is seen in  the distal aorta. Recheck in 5 years for stability.   Echocardiogram 03/20/2019: Left ventricle cavity is normal in size. Moderate concentric hypertrophy of the left ventricle. Normal LV systolic function with EF 55%. Normal global wall motion. Doppler evidence of grade I (impaired) diastolic dysfunction, normal LAP.  Left atrial cavity is normal in size. Aneurysmal interatrial septum without 2D or color Doppler evidence of interatrial shunt. Mild tricuspid regurgitation. Estimated pulmonary artery systolic pressure is 24 mmHg.  CT chest with contrast 01/13/2023: 1. Postoperative changes from right middle lobe wedge resection. No signs of residual/recurrent tumor. No new sites of disease identified within the chest. 2. Coronary artery calcifications. 3. Aortic Atherosclerosis (ICD10-I70.0) and Emphysema (ICD10-J43.9) EKG:    EKG Interpretation Date/Time:  Wednesday May 18 2023 11:09:59 EST Ventricular Rate:  78 PR Interval:  174 QRS Duration:  80 QT Interval:  394 QTC Calculation: 449 R Axis:   43  Text Interpretation: EKG 05/18/2023: Normal sinus rhythm at the rate of 78 bpm, normal axis.  Poor R progression, cannot exclude anteroseptal infarct old.  Probably normal variant.  Compared to 07/24/2019, no significant change. Confirmed by Delrae Rend [phone]) on 05/18/2023 11:32:43 AM    Medications and allergies    Not on File   Current Outpatient Medications:     albuterol (VENTOLIN HFA) 108 (90 Base) MCG/ACT inhaler, Inhale 2 puffs into the lungs every 6 (six) hours as needed., Disp: , Rfl:    amLODipine (NORVASC) 10 MG tablet, Take 1 tablet (10 mg total) by mouth daily., Disp: 90 tablet, Rfl: 1  aspirin 81 MG chewable tablet, Chew 81 mg by mouth daily., Disp: , Rfl:    Budeson-Glycopyrrol-Formoterol (BREZTRI AEROSPHERE) 160-9-4.8 MCG/ACT AERO, Inhale 2 puffs into the lungs in the morning and at bedtime., Disp: 3 each, Rfl: 3   buPROPion (WELLBUTRIN SR) 100 MG 12 hr tablet, TAKE 1 TABLET BY MOUTH TWICE A DAY, Disp: 180 tablet, Rfl: 2   ezetimibe (ZETIA) 10 MG tablet, Take 1 tablet (10 mg total) by mouth daily., Disp: 90 tablet, Rfl: 0   ferrous sulfate 325 (65 FE) MG tablet, Take 325 mg by mouth daily., Disp: , Rfl:    metFORMIN (GLUCOPHAGE) 500 MG tablet, , Disp: , Rfl:    metoprolol succinate (TOPROL-XL) 50 MG 24 hr tablet, Take 1 tablet (50 mg total) by mouth daily., Disp: 90 tablet, Rfl: 3   rosuvastatin (CRESTOR) 40 MG tablet, Take 1 tablet (40 mg total) by mouth daily., Disp: 30 tablet, Rfl: 5   varenicline (CHANTIX) 0.5 MG tablet, Take 1 tablet (0.5 mg total) by mouth 2 (two) times daily., Disp: 120 tablet, Rfl: 2   ASSESSMENT AND PLAN: .      ICD-10-CM   1. Coronary artery calcification seen on CAT scan  I25.10 EKG 12-Lead    2. Pure hypercholesterolemia  E78.00 ezetimibe (ZETIA) 10 MG tablet    3. Essential hypertension  I10     4. PAD (peripheral artery disease) (HCC)  I73.9 ezetimibe (ZETIA) 10 MG tablet    5. Tobacco use  Z72.0      Assessment and Plan    Coronary Artery and aortic atherosclerosis noted on CT scan of the chest Stable with no chest pain or worsening shortness of breath.  LDL is 90, which is above the target of 70.   -Continue current medications including Aspirin, Amlodipine, Metoprolol, and Crestor 40mg .   -Add Ezetimibe to further lower LDL cholesterol.   -Plan for follow-up with primary care provider, Dr.  Drinda Butts, in March to check cholesterol levels.    Peripheral Arterial Disease   Reduced pulse noted in right leg, but no cramping or pain reported.   -Continue current medications as he also benefits circulation in the legs.   -No further testing needed at this time.    Tobacco Use   Quit smoking in November 2024.   -Encouraged to clean apartment and wash all clothes to remove residual tobacco smell to prevent relapse.    General Health Maintenance   -Continue physical [facility] the mall and pulmonary rehab.   -Return for follow-up as needed for new symptoms such as chest pain, worsening shortness of breath, or leg cramping.   I have personally reviewed reports of his CT scans, prior echocardiogram, prior hospital records.  He has never had a stress test however in the absence of symptoms except for chronic dyspnea would recommend continued primary/secondary prevention.  Will request Dr. Charlane Ferretti to take over prescription.  Signed,  Yates Decamp, MD, [facility] 05/18/2023, 4:39 PM [facility] [address] [place], [place] [address] Phone: [phone]. Fax:  [phone]

## 2023-05-18 NOTE — Patient Instructions (Signed)
 Medication Instructions:  Your physician has recommended you make the following change in your medication: Start Zetia 10 mg by mouth daily  *If you need a refill on your cardiac medications before your next appointment, please call your pharmacy*   Lab Work: none If you have labs (blood work) drawn today and your tests are completely normal, you will receive your results only by: MyChart Message (if you have MyChart) OR A paper copy in the mail If you have any lab test that is abnormal or we need to change your treatment, we will call you to review the results.   Testing/Procedures: none   Follow-Up: At [facility], you and your health needs are our priority.  As part of our continuing mission to provide you with exceptional heart care, we have created designated Provider Care Teams.  These Care Teams include your primary Cardiologist (physician) and Advanced Practice Providers (APPs -  Physician Assistants and Nurse Practitioners) who all work together to provide you with the care you need, when you need it.  We recommend signing up for the patient portal called "MyChart".  Sign up information is provided on this After Visit Summary.  MyChart is used to connect with patients for Virtual Visits (Telemedicine).  Patients are able to view lab/test results, encounter notes, upcoming appointments, etc.  Non-urgent messages can be sent to your provider as well.   To learn more about what you can do with MyChart, go to [email or web address].    Your next appointment:   As needed  Provider:   Yates Decamp, MD     Other Instructions

## 2023-05-19 ENCOUNTER — Encounter (HOSPITAL_COMMUNITY)
Admission: RE | Admit: 2023-05-19 | Discharge: 2023-05-19 | Disposition: A | Discharge status: Home / Self Care | Payer: 59 | Source: Ambulatory Visit | Attending: Pulmonary Disease | Admitting: Pulmonary Disease

## 2023-05-19 DIAGNOSIS — J449 Chronic obstructive pulmonary disease, unspecified: Secondary | ICD-10-CM | POA: Diagnosis not present

## 2023-05-19 NOTE — Progress Notes (Signed)
Daily Session Note  Patient Details  Name: Larry [name] MRN: [id number] Date of Birth: May 14, 1951 Referring Provider:   Doristine Devoid Pulmonary Rehab Walk Test from 04/18/2023 in [facility]  Referring Provider Mannam       Encounter Date: 05/19/2023  Check In:  Session Check In - 05/19/23 1434       Check-In   Supervising physician immediately available to respond to emergencies [facility] MD immediately available    Physician(s) Robin Searing, NP    Location [facility]    Staff Present Essie Hart, RN, Doris Cheadle, MS, ACSM-CEP, Exercise Physiologist;Randi Dionisio Paschal, ACSM-CEP, Exercise Physiologist;Rylynn Kobs Katrinka Blazing, RT    Virtual Visit No    Medication changes reported     No    Fall or balance concerns reported    No    Tobacco Cessation No Change    Warm-up and Cool-down Performed as group-led instruction    Resistance Training Performed Yes    VAD Patient? No    PAD/SET Patient? No      Pain Assessment   Currently in Pain? No/denies    Multiple Pain Sites No             Capillary Blood Glucose: No results found for this or any previous visit (from the past 24 hours).    Social History   Tobacco Use  Smoking Status Former   Current packs/day: 0.50   Average packs/day: 0.5 packs/day for 53.0 years (26.5 ttl pk-yrs)   Types: Cigarettes  Smokeless Tobacco Never  Tobacco Comments   Quit smoking : 04/20/2023    Goals Met:  Proper associated with RPD/PD & O2 Sat Independence with exercise equipment Exercise tolerated well No report of concerns or symptoms today Strength training completed today  Goals Unmet:  Not Applicable  Comments: Service time is from 1305 to 1440.    Dr. Mechele Collin is Medical Director for Pulmonary Rehab at [facility].

## 2023-05-24 ENCOUNTER — Encounter (HOSPITAL_COMMUNITY)
Admission: RE | Admit: 2023-05-24 | Discharge: 2023-05-24 | Disposition: A | Discharge status: Home / Self Care | Payer: 59 | Source: Ambulatory Visit | Attending: Pulmonary Disease | Admitting: Pulmonary Disease

## 2023-05-24 VITALS — Wt 197.3 lb

## 2023-05-24 DIAGNOSIS — J449 Chronic obstructive pulmonary disease, unspecified: Secondary | ICD-10-CM

## 2023-05-24 NOTE — Progress Notes (Signed)
Daily Session Note  Patient Details  Name: Larry [name] MRN: [id number] Date of Birth: [birth year] Referring Provider:   Doristine Devoid Pulmonary Rehab Walk Test from 04/18/2023 in [facility], Vascular, & Lung Health  Referring Provider Mannam       Encounter Date: 05/24/2023  Check In:  Session Check In - 05/24/23 1023       Check-In   Supervising physician immediately available to respond to emergencies [facility] MD immediately available    Physician(s) Edd Fabian, NP    Location [facility]    Staff Present Essie Hart, RN, BSN;Randi Idelle Crouch BS, ACSM-CEP, Exercise Physiologist;Karielle Davidow Katrinka Blazing, RT    Virtual Visit No    Medication changes reported     No    Fall or balance concerns reported    No    Tobacco Cessation No Change    Warm-up and Cool-down Performed as group-led instruction    Resistance Training Performed Yes    VAD Patient? No    PAD/SET Patient? No      Pain Assessment   Currently in Pain? No/denies    Multiple Pain Sites No             Capillary Blood Glucose: No results found for this or any previous visit (from the past 24 hours).   Exercise Prescription Changes - 05/24/23 1200       Response to Exercise   Blood Pressure (Admit) 138/60    Blood Pressure (Exercise) 170/68    Blood Pressure (Exit) 106/58    Heart Rate (Admit) 80 bpm    Heart Rate (Exercise) 118 bpm    Heart Rate (Exit) 93 bpm    Oxygen Saturation (Admit) 99 %    Oxygen Saturation (Exercise) 93 %    Oxygen Saturation (Exit) 99 %    Rating of Perceived Exertion (Exercise) 11    Perceived Dyspnea (Exercise) 1    Duration Continue with 30 min of aerobic exercise without signs/symptoms of physical distress.    Intensity THRR New   59-135     Progression   Progression Continue to progress workloads to maintain intensity without signs/symptoms of physical distress.      Resistance Training   Training Prescription Yes    Weight  blue bands    Reps 10-15    Time 10 Minutes      Recumbant Bike   Level 3    Minutes 15    METs 2.6      NuStep   Level 3    SPM 97    Minutes 15    METs 2.5             Social History   Tobacco Use  Smoking Status Former   Current packs/day: 0.50   Average packs/day: 0.5 packs/day for 53.0 years (26.5 ttl pk-yrs)   Types: Cigarettes  Smokeless Tobacco Never  Tobacco Comments   Quit smoking : 04/20/2023    Goals Met:  Proper associated with RPD/PD & O2 Sat Independence with exercise equipment Exercise tolerated well No report of concerns or symptoms today Strength training completed today  Goals Unmet:  Not Applicable  Comments: Service time is from 1016 to 1140.    Dr. Mechele Collin is Medical Director for Pulmonary Rehab at [facility].

## 2023-05-24 NOTE — Progress Notes (Signed)
Pulmonary Individual Treatment Plan  Patient Details  Name: Larry [name] MRN: [id number] Date of Birth: 11-06-1950 Referring Provider:   Doristine Devoid Pulmonary Rehab Walk Test from 04/18/2023 in [facility]  Referring Provider Mannam       Initial Encounter Date:  Flowsheet Row Pulmonary Rehab Walk Test from 04/18/2023 in [facility]  Date 04/18/23       Visit Diagnosis: Stage 3 severe COPD by GOLD classification (HCC)  Patient's Home Medications on Admission:   Current Outpatient Medications:    albuterol (VENTOLIN HFA) 108 (90 Base) MCG/ACT inhaler, Inhale 2 puffs into the lungs every 6 (six) hours as needed., Disp: , Rfl:    amLODipine (NORVASC) 10 MG tablet, Take 1 tablet (10 mg total) by mouth daily., Disp: 90 tablet, Rfl: 1   aspirin 81 MG chewable tablet, Chew 81 mg by mouth daily., Disp: , Rfl:    Budeson-Glycopyrrol-Formoterol (BREZTRI AEROSPHERE) 160-9-4.8 MCG/ACT AERO, Inhale 2 puffs into the lungs in the morning and at bedtime., Disp: 3 each, Rfl: 3   buPROPion (WELLBUTRIN SR) 100 MG 12 hr tablet, TAKE 1 TABLET BY MOUTH TWICE A DAY, Disp: 180 tablet, Rfl: 2   ezetimibe (ZETIA) 10 MG tablet, Take 1 tablet (10 mg total) by mouth daily., Disp: 90 tablet, Rfl: 0   ferrous sulfate 325 (65 FE) MG tablet, Take 325 mg by mouth daily., Disp: , Rfl:    metFORMIN (GLUCOPHAGE) 500 MG tablet, , Disp: , Rfl:    metoprolol succinate (TOPROL-XL) 50 MG 24 hr tablet, Take 1 tablet (50 mg total) by mouth daily., Disp: 90 tablet, Rfl: 3   rosuvastatin (CRESTOR) 40 MG tablet, Take 1 tablet (40 mg total) by mouth daily., Disp: 30 tablet, Rfl: 5   varenicline (CHANTIX) 0.5 MG tablet, Take 1 tablet (0.5 mg total) by mouth 2 (two) times daily., Disp: 120 tablet, Rfl: 2  Past Medical History: Past Medical History:  Diagnosis Date   Angiodysplasia of cecum 06/01/2018    Cataract    Chronic cough    COPD (chronic obstructive pulmonary disease) (HCC)    Dyspnea on exertion    Full dentures    Hx of adenomatous colonic polyps 06/07/2018   Hyperlipidemia    Non-small cell carcinoma of left lung [facility]) oncologist-- dr Arbutus Ped   dx Nov 2015left upper lobe, 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, N1, M0)  s/p  left upper lobectomy with node dissection and neoadjuvant systemic chemotherapy   Productive cough    intermittantly   Prostate cancer [facility]) urologist-- dr wrenn/  oncologsit-  dr Kathrynn Running   Stage T1c , Gleason 4+3, PSA 4.1, vol 26.73cc--  External RXT complete and Radiactive seed implants on 04-03-2015   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) 05/22/2014        Tobacco Use: Social History   Tobacco Use  Smoking Status Former   Current packs/day: 0.50   Average packs/day: 0.5 packs/day for 53.0 years (26.5 ttl pk-yrs)   Types: Cigarettes  Smokeless Tobacco Never  Tobacco Comments   Quit smoking : 04/20/2023    Labs: Review Flowsheet  More data exists      Latest Ref Rng & Units 09/10/2014 03/30/2019 07/24/2019 07/26/2019 07/27/2019  Labs for ITP Cardiac and Pulmonary Rehab  Cholestrol 100 - 199 mg/dL - 725  - - -  LDL (calc) 0 - 99 mg/dL - 366  - - -  HDL-C >39 mg/dL - 47  - - -  Trlycerides 0 - 149 mg/dL - 829  - - -  PH, Arterial 7.350 - 7.450 7.425  - 7.408  7.300  7.223  7.179  7.418   PCO2 arterial 32.0 - 48.0 mmHg 44.5  - 36.3  58.2  62.7  68.3  38.3   Bicarbonate 20.0 - 28.0 mmol/L 29.2  - 22.5  28.6  25.8  25.4  24.3   TCO2 22 - 32 mmol/L 31  - - 30  28  27   -  Acid-base deficit 0.0 - 2.0 mmol/L - - 1.5  3.0  4.0  -  O2 Saturation % 98.0  - 97.9  100.0  89.0  87.0  96.2     Details       Multiple values from one day are sorted in reverse-chronological order         Capillary Blood Glucose: Lab Results  Component Value Date   GLUCAP 86 05/03/2023   GLUCAP 101 (H) 05/03/2023   GLUCAP 87 04/26/2023   GLUCAP  101 (H) 04/26/2023   GLUCAP 132 (H) 07/31/2019     Pulmonary Assessment Scores:  Pulmonary Assessment Scores     Row Name 04/18/23 1049         ADL UCSD   ADL Phase Entry     SOB Score total 29       CAT Score   CAT Score 24       mMRC Score   mMRC Score 3             UCSD: Self-administered rating of dyspnea associated with activities of daily living (ADLs) 6-point scale (0 = "not at all" to 5 = "maximal or unable to do because of breathlessness")  Scoring Scores range from 0 to 120.  Minimally important difference is 5 units  CAT: CAT can identify the health impairment of COPD patients and is better correlated with disease progression.  CAT has a scoring range of zero to 40. The CAT score is classified into four groups of low (less than 10), medium (10 - 20), high (21-30) and very high (31-40) based on the impact level of disease on health status. A CAT score over 10 suggests significant symptoms.  A worsening CAT score could be explained by an exacerbation, poor medication adherence, poor inhaler technique, or progression of COPD or comorbid conditions.  CAT MCID is 2 points  mMRC: mMRC (Modified Medical Research Council) Dyspnea Scale is used to assess the degree of baseline functional disability in patients of respiratory disease due to dyspnea. No minimal important difference is established. A decrease in score of 1 point or greater is considered a positive change.   Pulmonary Function Assessment:   Exercise Target Goals: Exercise Program Goal: Individual exercise prescription set using results from initial 6 min walk test and THRR while considering  patient's activity barriers and safety.   Exercise Prescription Goal: Initial exercise prescription builds to 30-45 minutes a day of aerobic activity, 2-3 days per week.  Home exercise guidelines will be given to patient during program as part of exercise prescription that the participant will  acknowledge.  Activity Barriers & Risk Stratification:  Activity Barriers & Cardiac Risk Stratification - 04/18/23 1045       Activity Barriers & Cardiac Risk Stratification   Activity Barriers Deconditioning;Muscular Weakness;Shortness of Breath             6 Minute Walk:  6 Minute Walk  Row Name 04/18/23 1208         6 Minute Walk   Phase Initial     Distance 875 feet     Walk Time 6 minutes     # of Rest Breaks 1  3:57-4:38     MPH 1.66     METS 2.46     RPE 13     Perceived Dyspnea  2     VO2 Peak 8.61     Symptoms No     Resting HR 87 bpm     Resting BP 130/74     Resting Oxygen Saturation  96 %     Exercise Oxygen Saturation  during 6 min walk 92 %     Max Ex. HR 117 bpm     Max Ex. BP 156/74     2 Minute Post BP 144/68       Interval HR   1 Minute HR 105     2 Minute HR 113     3 Minute HR 117     4 Minute HR 116     5 Minute HR 109     6 Minute HR 116     2 Minute Post HR 99     Interval Heart Rate? Yes       Interval Oxygen   Interval Oxygen? Yes     Baseline Oxygen Saturation % 96 %     1 Minute Oxygen Saturation % 98 %     1 Minute Liters of Oxygen 0 L     2 Minute Oxygen Saturation % 97 %     2 Minute Liters of Oxygen 0 L     3 Minute Oxygen Saturation % 94 %     3 Minute Liters of Oxygen 0 L     4 Minute Oxygen Saturation % 92 %     4 Minute Liters of Oxygen 0 L     5 Minute Oxygen Saturation % 92 %     5 Minute Liters of Oxygen 0 L     6 Minute Oxygen Saturation % 95 %     6 Minute Liters of Oxygen 0 L     2 Minute Post Oxygen Saturation % 97 %     2 Minute Post Liters of Oxygen 0 L              Oxygen Initial Assessment:  Oxygen Initial Assessment - 04/18/23 1049       Home Oxygen   Home Oxygen Device None    Sleep Oxygen Prescription None    Home Exercise Oxygen Prescription None    Home Resting Oxygen Prescription None      Initial 6 min Walk   Oxygen Used None      Program Oxygen Prescription   Program  Oxygen Prescription None      Intervention   Short Term Goals To learn and understand importance of monitoring SPO2 with pulse oximeter and demonstrate accurate use of the pulse oximeter.;To learn and understand importance of maintaining oxygen saturations>88%;To learn and demonstrate proper pursed lip breathing techniques or other breathing techniques. ;To learn and demonstrate proper use of respiratory medications    Long  Term Goals Maintenance of O2 saturations>88%;Compliance with respiratory medication;Verbalizes importance of monitoring SPO2 with pulse oximeter and return demonstration;Exhibits proper breathing techniques, such as pursed lip breathing or other method taught during program session;Demonstrates proper use of MDI's  Oxygen Re-Evaluation:  Oxygen Re-Evaluation     Row Name 05/16/23 0721             Program Oxygen Prescription   Program Oxygen Prescription None         Home Oxygen   Home Oxygen Device None       Sleep Oxygen Prescription None       Home Exercise Oxygen Prescription None       Home Resting Oxygen Prescription None         Goals/Expected Outcomes   Short Term Goals To learn and understand importance of monitoring SPO2 with pulse oximeter and demonstrate accurate use of the pulse oximeter.;To learn and understand importance of maintaining oxygen saturations>88%;To learn and demonstrate proper pursed lip breathing techniques or other breathing techniques. ;To learn and demonstrate proper use of respiratory medications       Long  Term Goals Maintenance of O2 saturations>88%;Compliance with respiratory medication;Verbalizes importance of monitoring SPO2 with pulse oximeter and return demonstration;Exhibits proper breathing techniques, such as pursed lip breathing or other method taught during program session;Demonstrates proper use of MDI's       Goals/Expected Outcomes Compliance and understanding of oxygen saturation monitoring and breathing  techniques to decrease shortness of breath.                Oxygen Discharge (Final Oxygen Re-Evaluation):  Oxygen Re-Evaluation - 05/16/23 0721       Program Oxygen Prescription   Program Oxygen Prescription None      Home Oxygen   Home Oxygen Device None    Sleep Oxygen Prescription None    Home Exercise Oxygen Prescription None    Home Resting Oxygen Prescription None      Goals/Expected Outcomes   Short Term Goals To learn and understand importance of monitoring SPO2 with pulse oximeter and demonstrate accurate use of the pulse oximeter.;To learn and understand importance of maintaining oxygen saturations>88%;To learn and demonstrate proper pursed lip breathing techniques or other breathing techniques. ;To learn and demonstrate proper use of respiratory medications    Long  Term Goals Maintenance of O2 saturations>88%;Compliance with respiratory medication;Verbalizes importance of monitoring SPO2 with pulse oximeter and return demonstration;Exhibits proper breathing techniques, such as pursed lip breathing or other method taught during program session;Demonstrates proper use of MDI's    Goals/Expected Outcomes Compliance and understanding of oxygen saturation monitoring and breathing techniques to decrease shortness of breath.             Initial Exercise Prescription:  Initial Exercise Prescription - 04/18/23 1200       Date of Initial Exercise RX and Referring Provider   Date 04/18/23    Referring Provider Mannam    Expected Discharge Date 07/14/23      Recumbant Bike   Level 1    Minutes 15    METs 2      NuStep   Level 1    SPM 60    Minutes 15    METs 2      Prescription Details   Frequency (times per week) 2    Duration Progress to 30 minutes of continuous aerobic without signs/symptoms of physical distress      Intensity   THRR 40-80% of Max Heartrate 59-118    Ratings of Perceived Exertion 11-13    Perceived Dyspnea 0-4      Progression    Progression Continue to progress workloads to maintain intensity without signs/symptoms of physical distress.      Resistance  Training   Training Prescription Yes    Weight blue bands    Reps 10-15             Perform Capillary Blood Glucose checks as needed.  Exercise Prescription Changes:   Exercise Prescription Changes     Row Name 04/26/23 1100 05/10/23 1200           Response to Exercise   Blood Pressure (Admit) 120/74 142/70      Blood Pressure (Exercise) 146/70 160/70      Blood Pressure (Exit) 114/68 118/68      Heart Rate (Admit) 91 bpm 88 bpm      Heart Rate (Exercise) 113 bpm 120 bpm      Heart Rate (Exit) 99 bpm 98 bpm      Oxygen Saturation (Admit) 98 % 98 %      Oxygen Saturation (Exercise) 93 % 94 %      Oxygen Saturation (Exit) 97 % 98 %      Rating of Perceived Exertion (Exercise) 13 13      Perceived Dyspnea (Exercise) 1 1      Duration Progress to 30 minutes of  aerobic without signs/symptoms of physical distress Continue with 30 min of aerobic exercise without signs/symptoms of physical distress.      Intensity THRR unchanged THRR unchanged        Progression   Progression Continue to progress workloads to maintain intensity without signs/symptoms of physical distress. Continue to progress workloads to maintain intensity without signs/symptoms of physical distress.        Resistance Training   Training Prescription Yes Yes      Weight blue bands blue bands      Reps 10-15 10-15      Time 10 Minutes 10 Minutes        Recumbant Bike   Level 1 3      RPM 55 --      Minutes 15 15      METs 1.9 2.8        NuStep   Level 2 2      SPM 83 --      Minutes 15 15      METs 2.8 2.8               Exercise Comments:   Exercise Comments     Row Name 04/26/23 1151           Exercise Comments Pt completed first day of exercise. He exercised for 15 min on the recumbent bike and Nustep. Enzio averged 1.9 METs at level 1 on the recumbent  bike and 2.8 METs at level 2 on the Nustep. He performed the warmup and cooldown standing without limitations. Discussed METs.                Exercise Goals and Review:   Exercise Goals     Row Name 04/18/23 1045             Exercise Goals   Increase Physical Activity Yes       Intervention Provide advice, education, support and counseling about physical activity/exercise needs.;Develop an individualized exercise prescription for aerobic and resistive training based on initial evaluation findings, risk stratification, comorbidities and participant's personal goals.       Expected Outcomes Short Term: Attend rehab on a regular basis to increase amount of physical activity.;Long Term: Exercising regularly at least 3-5 days a week.;Long Term: Add in home exercise to make exercise part  of routine and to increase amount of physical activity.       Increase Strength and Stamina Yes       Intervention Provide advice, education, support and counseling about physical activity/exercise needs.;Develop an individualized exercise prescription for aerobic and resistive training based on initial evaluation findings, risk stratification, comorbidities and participant's personal goals.       Expected Outcomes Short Term: Increase workloads from initial exercise prescription for resistance, speed, and METs.;Short Term: Perform resistance training exercises routinely during rehab and add in resistance training at home;Long Term: Improve cardiorespiratory fitness, muscular endurance and strength as measured by increased METs and functional capacity ( )       Able to understand and use rate of perceived exertion (RPE) scale Yes       Intervention Provide education and explanation on how to use RPE scale       Expected Outcomes Short Term: Able to use RPE daily in rehab to express subjective intensity level;Long Term:  Able to use RPE to guide intensity level when exercising independently       Able to  understand and use Dyspnea scale Yes       Intervention Provide education and explanation on how to use Dyspnea scale       Expected Outcomes Short Term: Able to use Dyspnea scale daily in rehab to express subjective sense of shortness of breath during exertion;Long Term: Able to use Dyspnea scale to guide intensity level when exercising independently       Knowledge and understanding of Target Heart Rate Range (THRR) Yes       Intervention Provide education and explanation of THRR including how the numbers were predicted and where they are located for reference       Expected Outcomes Short Term: Able to state/look up THRR;Short Term: Able to use daily as guideline for intensity in rehab;Long Term: Able to use THRR to govern intensity when exercising independently       Understanding of Exercise Prescription Yes       Intervention Provide education, explanation, and written materials on patient's individual exercise prescription       Expected Outcomes Short Term: Able to explain program exercise prescription;Long Term: Able to explain home exercise prescription to exercise independently                Exercise Goals Re-Evaluation :  Exercise Goals Re-Evaluation     Row Name 04/27/23 0823 05/16/23 0721           Exercise Goal Re-Evaluation   Exercise Goals Review Increase Physical Activity;Able to understand and use Dyspnea scale;Understanding of Exercise Prescription;Increase Strength and Stamina;Knowledge and understanding of Target Heart Rate Range (THRR);Able to understand and use rate of perceived exertion (RPE) scale Increase Physical Activity;Able to understand and use Dyspnea scale;Understanding of Exercise Prescription;Increase Strength and Stamina;Knowledge and understanding of Target Heart Rate Range (THRR);Able to understand and use rate of perceived exertion (RPE) scale      Comments Murad has completed 1 exercise sessions. He exercises for 15 min on the recumbent bike and  Nustep. Marlow averges 1.9 METs at level 1 on the recumbent bike and 2.8 METs at level 2 on the Nustep. He performed the warmup and cooldown standing without limitations. It is too soon to notate any discernable progressions. Will continue to monitor and progress as able. Jocob has completed 5 exercise sessions. He exercises for 15 min on the recumbent bike and Nustep. Antrone averages 2.7 METs at level 3 on  the recumbent bike and 2.9 METs at level 2 on the Nustep. He performs the warmup and cooldown standing without limitations. Reyaansh has increased his level for the recumbent bike but not Nustep. METs have also increased. Harlan tolerates the recumbent bike progressions well. He also needed a few verbal cues during the cooldown. Olly is able to perform the cooldown independently now. Will increase Nustep level next time. Will continue to monitor and progress as able.      Expected Outcomes Through exercise at rehab and home, the patient will decrease shortness of breath with daily activities and feel confident in carrying out an exercise regimen at home. Through exercise at rehab and home, the patient will decrease shortness of breath with daily activities and feel confident in carrying out an exercise regimen at home.               Discharge Exercise Prescription (Final Exercise Prescription Changes):  Exercise Prescription Changes - 05/10/23 1200       Response to Exercise   Blood Pressure (Admit) 142/70    Blood Pressure (Exercise) 160/70    Blood Pressure (Exit) 118/68    Heart Rate (Admit) 88 bpm    Heart Rate (Exercise) 120 bpm    Heart Rate (Exit) 98 bpm    Oxygen Saturation (Admit) 98 %    Oxygen Saturation (Exercise) 94 %    Oxygen Saturation (Exit) 98 %    Rating of Perceived Exertion (Exercise) 13    Perceived Dyspnea (Exercise) 1    Duration Continue with 30 min of aerobic exercise without signs/symptoms of physical distress.    Intensity THRR unchanged       Progression   Progression Continue to progress workloads to maintain intensity without signs/symptoms of physical distress.      Resistance Training   Training Prescription Yes    Weight blue bands    Reps 10-15    Time 10 Minutes      Recumbant Bike   Level 3    Minutes 15    METs 2.8      NuStep   Level 2    Minutes 15    METs 2.8             Nutrition:  Target Goals: Understanding of nutrition guidelines, daily intake of sodium 1500mg , cholesterol 200mg , calories 30% from fat and 7% or less from saturated fats, daily to have 5 or more servings of fruits and vegetables.  Biometrics:    Nutrition Therapy Plan and Nutrition Goals:   Nutrition Assessments:  MEDIFICTS Score Key: >=70 Need to make dietary changes  40-70 Heart Healthy Diet <= 40 Therapeutic Level Cholesterol Diet   Picture Your Plate Scores: <82 Unhealthy dietary pattern with much room for improvement. 41-50 Dietary pattern unlikely to meet recommendations for good health and room for improvement. 51-60 More healthful dietary pattern, with some room for improvement.  >60 Healthy dietary pattern, although there may be some specific behaviors that could be improved.    Nutrition Goals Re-Evaluation:   Nutrition Goals Discharge (Final Nutrition Goals Re-Evaluation):   Psychosocial: Target Goals: Acknowledge presence or absence of significant depression and/or stress, maximize coping skills, provide positive support system. Participant is able to verbalize types and ability to use techniques and skills needed for reducing stress and depression.  Initial Review & Psychosocial Screening:  Initial Psych Review & Screening - 04/18/23 1051       Initial Review   Current issues with None Identified  Family Dynamics   Good Support System? Yes      Barriers   Psychosocial barriers to participate in program There are no identifiable barriers or psychosocial needs.      Screening  Interventions   Interventions Provide feedback about the scores to participant             Quality of Life Scores:  Scores of 19 and below usually indicate a poorer quality of life in these areas.  A difference of  2-3 points is a clinically meaningful difference.  A difference of 2-3 points in the total score of the Quality of Life Index has been associated with significant improvement in overall quality of life, self-image, physical symptoms, and general health in studies assessing change in quality of life.  PHQ-9: Review Flowsheet  More data exists      04/18/2023 04/18/2015 02/28/2015 02/21/2015 02/06/2015  Depression screen PHQ 2/9  Decreased Interest 0 0 0 0 0  Down, Depressed, Hopeless 0 0 0 0 0  PHQ - 2 Score 0 0 0 0 0  Altered sleeping 1 - - - -  Tired, decreased energy 1 - - - -  Change in appetite 0 - - - -  Feeling bad or failure about yourself  0 - - - -  Trouble concentrating 0 - - - -  Moving slowly or fidgety/restless 0 - - - -  Suicidal thoughts 0 - - - -  PHQ-9 Score 2 - - - -  Difficult doing work/chores Not difficult at all - - - -   Interpretation of Total Score  Total Score Depression Severity:  1-4 = Minimal depression, 5-9 = Mild depression, 10-14 = Moderate depression, 15-19 = Moderately severe depression, 20-27 = Severe depression   Psychosocial Evaluation and Intervention:  Psychosocial Evaluation - 04/18/23 1051       Psychosocial Evaluation & Interventions   Interventions Encouraged to exercise with the program and follow exercise prescription    Comments Urban denies any barriers or psy/soc concerns at this time    Expected Outcomes For Turan to exercise in Pulm Rehab without any barriers    Continue Psychosocial Services  No Follow up required             Psychosocial Re-Evaluation:  Psychosocial Re-Evaluation     Row Name 04/22/23 1351 05/16/23 1155           Psychosocial Re-Evaluation   Current issues with None  Identified None Identified      Comments Onofre has not started the program yet. No new psychosocial barriers or concerns since orientation on 04/18/23. Rikki continues to deny any psychosocial barriers or concerns.      Expected Outcomes For Kaylup to participate in PR free of any psychosocial barriers or concerns For Lakin to participate in PR free of any psychosocial barriers or concerns      Interventions Encouraged to attend Pulmonary Rehabilitation for the exercise Encouraged to attend Pulmonary Rehabilitation for the exercise      Continue Psychosocial Services  No Follow up required No Follow up required               Psychosocial Discharge (Final Psychosocial Re-Evaluation):  Psychosocial Re-Evaluation - 05/16/23 1155       Psychosocial Re-Evaluation   Current issues with None Identified    Comments Dennise continues to deny any psychosocial barriers or concerns.    Expected Outcomes For Dickson to participate in PR free of any psychosocial barriers or concerns  Interventions Encouraged to attend Pulmonary Rehabilitation for the exercise    Continue Psychosocial Services  No Follow up required             Education: Education Goals: Education classes will be provided on a weekly basis, covering required topics. Participant will state understanding/return demonstration of topics presented.  Learning Barriers/Preferences:   Education Topics: Know Your Numbers Group instruction that is supported by a PowerPoint presentation. Instructor discusses importance of knowing and understanding resting, exercise, and post-exercise oxygen saturation, heart rate, and blood pressure. Oxygen saturation, heart rate, blood pressure, rating of perceived exertion, and dyspnea are reviewed along with a normal range for these values.    Exercise for the Pulmonary Patient Group instruction that is supported by a PowerPoint presentation. Instructor discusses benefits of  exercise, core components of exercise, frequency, duration, and intensity of an exercise routine, importance of utilizing pulse oximetry during exercise, safety while exercising, and options of places to exercise outside of rehab.    MET Level  Group instruction provided by PowerPoint, verbal discussion, and written material to support subject matter. Instructor reviews what METs are and how to increase METs.    Pulmonary Medications Verbally interactive group education provided by instructor with focus on inhaled medications and proper administration. Flowsheet Row PULMONARY REHAB CHRONIC OBSTRUCTIVE PULMONARY DISEASE from 05/19/2023 in [facility], Vascular, & Lung [facility]  Date 05/19/23  Educator RT  Instruction Review Code 1- Verbalizes Understanding       Anatomy and Physiology of the Respiratory System Group instruction provided by PowerPoint, verbal discussion, and written material to support subject matter. Instructor reviews respiratory cycle and anatomical components of the respiratory system and their functions. Instructor also reviews differences in obstructive and restrictive respiratory diseases with examples of each.  Flowsheet Row PULMONARY REHAB CHRONIC OBSTRUCTIVE PULMONARY DISEASE from 05/12/2023 in [facility], Vascular, & Lung [facility]  Date 05/12/23  Educator RT  Instruction Review Code 1- Verbalizes Understanding       Oxygen Safety Group instruction provided by PowerPoint, verbal discussion, and written material to support subject matter. There is an overview of "What is Oxygen" and "Why do we need it".  Instructor also reviews how to create a safe environment for oxygen use, the importance of using oxygen as prescribed, and the risks of noncompliance. There is a brief discussion on traveling with oxygen and resources the patient may utilize.   Oxygen Use Group instruction provided by PowerPoint,  verbal discussion, and written material to discuss how supplemental oxygen is prescribed and different types of oxygen supply systems. Resources for more information are provided.    Breathing Techniques Group instruction that is supported by demonstration and informational handouts. Instructor discusses the benefits of pursed lip and diaphragmatic breathing and detailed demonstration on how to perform both.     Risk Factor Reduction Group instruction that is supported by a PowerPoint presentation. Instructor discusses the definition of a risk factor, different risk factors for pulmonary disease, and how the heart and lungs work together.   Pulmonary Diseases Group instruction provided by PowerPoint, verbal discussion, and written material to support subject matter. Instructor gives an overview of the different type of pulmonary diseases. There is also a discussion on risk factors and symptoms as well as ways to manage the diseases. Flowsheet Row PULMONARY REHAB CHRONIC OBSTRUCTIVE PULMONARY DISEASE from 05/05/2023 in [facility] for Heart, Vascular, & Lung Health  Date 05/05/23  Educator  RT  Instruction Review Code 1- Verbalizes Understanding       Stress and Energy Conservation Group instruction provided by PowerPoint, verbal discussion, and written material to support subject matter. Instructor gives an overview of stress and the impact it can have on the body. Instructor also reviews ways to reduce stress. There is also a discussion on energy conservation and ways to conserve energy throughout the day.   Warning Signs and Symptoms Group instruction provided by PowerPoint, verbal discussion, and written material to support subject matter. Instructor reviews warning signs and symptoms of stroke, heart attack, cold and flu. Instructor also reviews ways to prevent the spread of infection.   Other Education Group or individual verbal, written, or video instructions  that support the educational goals of the pulmonary rehab program.    Knowledge Questionnaire Score:  Knowledge Questionnaire Score - 04/18/23 1110       Knowledge Questionnaire Score   Pre Score 12/18             Core Components/Risk Factors/Patient Goals at Admission:  Personal Goals and Risk Factors at Admission - 04/18/23 1052       Core Components/Risk Factors/Patient Goals on Admission   Tobacco Cessation Yes    Intervention Assist the participant in steps to quit. Provide individualized education and counseling about committing to Tobacco Cessation, relapse prevention, and pharmacological support that can be provided by physician.;Education officer, environmental, assist with locating and accessing local/national Quit Smoking programs, and support quit date choice.    Expected Outcomes Short Term: Will demonstrate readiness to quit, by selecting a quit date.;Long Term: Complete abstinence from all tobacco products for at least 12 months from quit date.;Short Term: Will quit all tobacco product use, adhering to prevention of relapse plan.    Improve shortness of breath with ADL's Yes    Intervention Provide education, individualized exercise plan and daily activity instruction to help decrease symptoms of SOB with activities of daily living.    Expected Outcomes Short Term: Improve cardiorespiratory fitness to achieve a reduction of symptoms when performing ADLs;Long Term: Be able to perform more ADLs without symptoms or delay the onset of symptoms    Increase knowledge of respiratory medications and ability to use respiratory devices properly  Yes    Intervention Provide education and demonstration as needed of appropriate use of medications, inhalers, and oxygen therapy.    Expected Outcomes Short Term: Achieves understanding of medications use. Understands that oxygen is a medication prescribed by physician. Demonstrates appropriate use of inhaler and oxygen therapy.;Long Term:  Maintain appropriate use of medications, inhalers, and oxygen therapy.    Diabetes Yes    Intervention Provide education about signs/symptoms and action to take for hypo/hyperglycemia.;Provide education about proper nutrition, including hydration, and aerobic/resistive exercise prescription along with prescribed medications to achieve blood glucose in normal ranges: Fasting glucose 65-99 mg/dL    Expected Outcomes Short Term: Participant verbalizes understanding of the signs/symptoms and immediate care of hyper/hypoglycemia, proper foot care and importance of medication, aerobic/resistive exercise and nutrition plan for blood glucose control.             Core Components/Risk Factors/Patient Goals Review:   Goals and Risk Factor Review     Row Name 04/22/23 1353 04/26/23 1150 05/16/23 1200         Core Components/Risk Factors/Patient Goals Review   Personal Goals Review Tobacco Cessation;Improve shortness of breath with ADL's;Develop more efficient breathing techniques such as purse lipped breathing and diaphragmatic breathing and practicing self-pacing  with activity.;Increase knowledge of respiratory medications and ability to use respiratory devices properly.;Diabetes Tobacco Cessation Tobacco Cessation;Improve shortness of breath with ADL's;Develop more efficient breathing techniques such as purse lipped breathing and diaphragmatic breathing and practicing self-pacing with activity.;Increase knowledge of respiratory medications and ability to use respiratory devices properly.;Diabetes     Review Boden is scheduled to start PR on 11/26. Goal progressing for tobacco cessation. Goal progressing for improving shortness of breath with ADL's. Goal progressing for developing more efficient breathing techniques such as purse lipped breathing and diaphragmatic breathing; and practicing self-pacing with activity. Goal progressing for increase knowledge or respiratory medications and ability to use  respiratory devices properly. Goal progressing for diabetes. Discussed smoking cessation with pt today. He sts he is on chantix and nicotine patch currently and is losing his taste for smoking. He sts he is cutting down on his cigarettes and the last cigarette he had was 12 pm yesterday. Congratulated pt on not smoking for 24 hours. Discussed tips for distraction/substition. Gave pt Quitting Smoking resource sheet. Encouraged him to talk with [place] for questions or concerns. Goal progressing for tobacco cessation. Goal progressing for improving shortness of breath with ADL's. Pt is exercising on RA and keeping sats >88%. Goal progressing for developing more efficient breathing techniques such as purse lipped breathing and diaphragmatic breathing; and practicing self-pacing with activity. Goal met for increase knowledge or respiratory medications and ability to use respiratory devices properly. Pt demonstrated proper use of MDI to staff respiratory therapist.  Goal met for diabetes. Pt met with his primary doctor and was placed on Metformin for diabetes. His blood sugars have been stable before and after exercise.     Expected Outcomes To quit smoking, improve shortness of breath with ADL's, develop more efficient breathing techniques such as purse lipped breathing and diaphragmatic breathing; and practicing self-pacing with activity, increase knowledge or respiratory medications and ability to use respiratory devices properly and have better control of his diabetes. To quit smoking completely. To quit smoking, improve shortness of breath with ADL's and develop more efficient breathing techniques such as purse lipped breathing and diaphragmatic breathing; and practicing self-pacing with activity.              Core Components/Risk Factors/Patient Goals at Discharge (Final Review):   Goals and Risk Factor Review - 05/16/23 1200       Core Components/Risk Factors/Patient Goals Review   Personal Goals Review  Tobacco Cessation;Improve shortness of breath with ADL's;Develop more efficient breathing techniques such as purse lipped breathing and diaphragmatic breathing and practicing self-pacing with activity.;Increase knowledge of respiratory medications and ability to use respiratory devices properly.;Diabetes    Review Goal progressing for tobacco cessation. Goal progressing for improving shortness of breath with ADL's. Pt is exercising on RA and keeping sats >88%. Goal progressing for developing more efficient breathing techniques such as purse lipped breathing and diaphragmatic breathing; and practicing self-pacing with activity. Goal met for increase knowledge or respiratory medications and ability to use respiratory devices properly. Pt demonstrated proper use of MDI to staff respiratory therapist.  Goal met for diabetes. Pt met with his primary doctor and was placed on Metformin for diabetes. His blood sugars have been stable before and after exercise.    Expected Outcomes To quit smoking, improve shortness of breath with ADL's and develop more efficient breathing techniques such as purse lipped breathing and diaphragmatic breathing; and practicing self-pacing with activity.             ITP Comments:  Pt is making expected progress toward Pulmonary Rehab goals after completing 7 session(s). Recommend continued exercise, life style modification, education, and utilization of breathing techniques to increase stamina and strength, while also decreasing shortness of breath with exertion.     Comments: Dr. Mechele Collin is Medical Director for Pulmonary Rehab at [facility].

## 2023-05-26 ENCOUNTER — Encounter (HOSPITAL_COMMUNITY)
Admission: RE | Admit: 2023-05-26 | Discharge: 2023-05-26 | Disposition: A | Discharge status: Home / Self Care | Payer: 59 | Source: Ambulatory Visit | Attending: Pulmonary Disease | Admitting: Pulmonary Disease

## 2023-05-26 DIAGNOSIS — J449 Chronic obstructive pulmonary disease, unspecified: Secondary | ICD-10-CM | POA: Diagnosis not present

## 2023-05-26 NOTE — Progress Notes (Signed)
Daily Session Note  Patient Details  Name: Larry [name] MRN: [id number] Date of Birth: June 10, 1950 Referring Provider:   Doristine Devoid Pulmonary Rehab Walk Test from 04/18/2023 in [facility] for [facility], Vascular, & Lung Health  Referring Provider Mannam       Encounter Date: 05/26/2023  Check In:  Session Check In - 05/26/23 0955       Check-In   Supervising physician immediately available to respond to emergencies [facility] MD immediately available    Physician(s) Reather Littler, NP    Location [facility]    Staff Present Essie Hart, RN, BSN;Randi Idelle Crouch BS, ACSM-CEP, Exercise Physiologist;Renella Steig Katrinka Blazing, RT    Virtual Visit No    Medication changes reported     No    Fall or balance concerns reported    No    Tobacco Cessation No Change    Warm-up and Cool-down Performed as group-led instruction    Resistance Training Performed Yes    VAD Patient? No    PAD/SET Patient? No      Pain Assessment   Currently in Pain? No/denies    Multiple Pain Sites No             Capillary Blood Glucose: No results found for this or any previous visit (from the past 24 hours).    Social History   Tobacco Use  Smoking Status Former   Current packs/day: 0.50   Average packs/day: 0.5 packs/day for 53.0 years (26.5 ttl pk-yrs)   Types: Cigarettes  Smokeless Tobacco Never  Tobacco Comments   Quit smoking : 04/20/2023    Goals Met:  Proper associated with RPD/PD & O2 Sat Independence with exercise equipment Exercise tolerated well No report of concerns or symptoms today Strength training completed today  Goals Unmet:  Not Applicable  Comments: Service time is from 1011 to 1158.    Dr. Mechele Collin is Medical Director for Pulmonary Rehab at [facility].

## 2023-05-31 ENCOUNTER — Encounter (HOSPITAL_COMMUNITY)
Admission: RE | Admit: 2023-05-31 | Discharge: 2023-05-31 | Disposition: A | Discharge status: Home / Self Care | Payer: 59 | Source: Ambulatory Visit | Attending: Pulmonary Disease

## 2023-05-31 DIAGNOSIS — J449 Chronic obstructive pulmonary disease, unspecified: Secondary | ICD-10-CM

## 2023-05-31 NOTE — Progress Notes (Signed)
 Daily Session Note  Patient Details  Name: Larry [name] MRN: [id number] Date of Birth: 1950/08/10 Referring Provider:   Conrad Ports Pulmonary Rehab Walk Test from 04/18/2023 in [facility] for Heart, Vascular, & Lung Health  Referring Provider Mannam       Encounter Date: 05/31/2023  Check In:  Session Check In - 05/31/23 1021       Check-In   Supervising physician immediately available to respond to emergencies [facility] MD immediately available    Physician(s) Orren fabry, NP    Location [facility]    Staff Present Ronal Levin, RN, BSN;Randi Reeve BS, ACSM-CEP, Exercise Physiologist;Casey Claudene, RT    Virtual Visit No    Medication changes reported     No    Fall or balance concerns reported    No    Tobacco Cessation No Change    Warm-up and Cool-down Performed as group-led instruction    Resistance Training Performed Yes    VAD Patient? No    PAD/SET Patient? No      Pain Assessment   Currently in Pain? No/denies    Multiple Pain Sites No             Capillary Blood Glucose: No results found for this or any previous visit (from the past 24 hours).    Social History   Tobacco Use  Smoking Status Former   Current packs/day: 0.50   Average packs/day: 0.5 packs/day for 53.0 years (26.5 ttl pk-yrs)   Types: Cigarettes  Smokeless Tobacco Never  Tobacco Comments   Quit smoking : 04/20/2023    Goals Met:  Independence with exercise equipment Exercise tolerated well No report of concerns or symptoms today Strength training completed today  Goals Unmet:  Not Applicable  Comments: Service time is from 1013 to 1135    Dr. Slater Staff is Medical Director for Pulmonary Rehab at [facility].

## 2023-06-02 ENCOUNTER — Encounter (HOSPITAL_COMMUNITY)
Admission: RE | Admit: 2023-06-02 | Discharge: 2023-06-02 | Disposition: A | Discharge status: Home / Self Care | Payer: 59 | Source: Ambulatory Visit | Attending: Pulmonary Disease | Admitting: Pulmonary Disease

## 2023-06-02 DIAGNOSIS — J449 Chronic obstructive pulmonary disease, unspecified: Secondary | ICD-10-CM | POA: Insufficient documentation

## 2023-06-02 NOTE — Progress Notes (Signed)
 Daily Session Note  Patient Details  Name: Larry [name] MRN: [id number] Date of Birth: [birth year] Referring Provider:   Conrad Ports Pulmonary Rehab Walk Test from 04/18/2023 in [facility] for Heart, Vascular, & Lung Health  Referring Provider Mannam       Encounter Date: 06/02/2023  Check In:  Session Check In - 06/02/23 1107       Check-In   Supervising physician immediately available to respond to emergencies [facility] MD immediately available    Physician(s) Rosaline Skains, NP    Location [facility]    Staff Present Ronal Levin, RN, BSN;Randi Midge BS, ACSM-CEP, Exercise Physiologist;Casey Claudene, RT    Virtual Visit No    Medication changes reported     No    Fall or balance concerns reported    No    Tobacco Cessation No Change    Warm-up and Cool-down Performed as group-led instruction    Resistance Training Performed Yes    VAD Patient? No    PAD/SET Patient? No      Pain Assessment   Currently in Pain? No/denies    Multiple Pain Sites No             Capillary Blood Glucose: No results found for this or any previous visit (from the past 24 hours).    Social History   Tobacco Use  Smoking Status Former   Current packs/day: 0.50   Average packs/day: 0.5 packs/day for 53.0 years (26.5 ttl pk-yrs)   Types: Cigarettes  Smokeless Tobacco Never  Tobacco Comments   Quit smoking : 04/20/2023    Goals Met:  Independence with exercise equipment Exercise tolerated well No report of concerns or symptoms today Strength training completed today  Goals Unmet:  Not Applicable  Comments: Service time is from 1013 to 1149    Dr. Slater Staff is Medical Director for Pulmonary Rehab at [facility].

## 2023-06-07 ENCOUNTER — Encounter (HOSPITAL_COMMUNITY)
Admission: RE | Admit: 2023-06-07 | Discharge: 2023-06-07 | Disposition: A | Discharge status: Home / Self Care | Payer: 59 | Source: Ambulatory Visit | Attending: Pulmonary Disease | Admitting: Pulmonary Disease

## 2023-06-07 VITALS — Wt 196.7 lb

## 2023-06-07 DIAGNOSIS — J449 Chronic obstructive pulmonary disease, unspecified: Secondary | ICD-10-CM | POA: Diagnosis not present

## 2023-06-07 NOTE — Progress Notes (Signed)
 Daily Session Note  Patient Details  Name: Larry [name] MRN: [id number] Date of Birth: [birth year] Referring Provider:   Conrad Ports Pulmonary Rehab Walk Test from 04/18/2023 in [facility] for Heart, Vascular, & Lung Health  Referring Provider Mannam       Encounter Date: 06/07/2023  Check In:  Session Check In - 06/07/23 1014       Check-In   Supervising physician immediately available to respond to emergencies [facility] MD immediately available    Physician(s) Rosaline Skains, NP    [facility]    Staff Present Ronal Levin, RN, BSN;Randi Midge BS, ACSM-CEP, Exercise Physiologist;Casey Claudene Neita Moats, MS, ACSM-CEP, Exercise Physiologist    Virtual Visit No    Medication changes reported     No    Fall or balance concerns reported    No    Tobacco Cessation No Change    Warm-up and Cool-down Performed as group-led instruction    Resistance Training Performed Yes    VAD Patient? No    PAD/SET Patient? No      Pain Assessment   Currently in Pain? No/denies    Multiple Pain Sites No             Capillary Blood Glucose: No results found for this or any previous visit (from the past 24 hours).   Exercise Prescription Changes - 06/07/23 1100       Response to Exercise   Blood Pressure (Admit) 130/70    Blood Pressure (Exercise) 186/70    Blood Pressure (Exit) 110/62    Heart Rate (Admit) 73 bpm    Heart Rate (Exercise) 128 bpm    Heart Rate (Exit) 92 bpm    Oxygen Saturation (Admit) 99 %    Oxygen Saturation (Exercise) 94 %    Oxygen Saturation (Exit) 98 %    Rating of Perceived Exertion (Exercise) 11    Perceived Dyspnea (Exercise) 1    Duration Continue with 30 min of aerobic exercise without signs/symptoms of physical distress.    Intensity THRR unchanged      Progression   Progression Continue to progress workloads to maintain intensity without signs/symptoms of physical distress.      Resistance  Training   Training Prescription Yes    Weight blue bands    Reps 10-15    Time 10 Minutes      Recumbant Bike   Level 4    Minutes 15    METs 3.2      NuStep   Level 3    Minutes 15    METs 2.9             Social History   Tobacco Use  Smoking Status Former   Current packs/day: 0.50   Average packs/day: 0.5 packs/day for 53.0 years (26.5 ttl pk-yrs)   Types: Cigarettes  Smokeless Tobacco Never  Tobacco Comments   Quit smoking : 04/20/2023    Goals Met:  Proper associated with RPD/PD & O2 Sat Independence with exercise equipment Exercise tolerated well No report of concerns or symptoms today Strength training completed today  Goals Unmet:  Not Applicable  Comments: Service time is from 1004 to 1132.    Dr. Slater Staff is Medical Director for Pulmonary Rehab at [facility].

## 2023-06-09 ENCOUNTER — Encounter (HOSPITAL_COMMUNITY)
Admission: RE | Admit: 2023-06-09 | Discharge: 2023-06-09 | Disposition: A | Discharge status: Home / Self Care | Payer: 59 | Source: Ambulatory Visit | Attending: Pulmonary Disease | Admitting: Pulmonary Disease

## 2023-06-09 DIAGNOSIS — J449 Chronic obstructive pulmonary disease, unspecified: Secondary | ICD-10-CM

## 2023-06-09 NOTE — Progress Notes (Signed)
 Daily Session Note  Patient Details  Name: Larry [name] MRN: [id number] Date of Birth: [birth year] Referring Provider:   Conrad Ports Pulmonary Rehab Walk Test from 04/18/2023 in [facility] for Heart, Vascular, & Lung Health  Referring Provider Mannam       Encounter Date: 06/09/2023  Check In:  Session Check In - 06/09/23 1018       Check-In   Supervising physician immediately available to respond to emergencies [facility] MD immediately available    Physician(s) Barnie Press, NP    Location [facility]    Staff Present Ronal Levin, RN, BSN;Randi Midge HECKLE, ACSM-CEP, Exercise Physiologist;Casey Claudene Neita Moats, MS, ACSM-CEP, Exercise Physiologist    Virtual Visit No    Medication changes reported     No    Fall or balance concerns reported    No    Tobacco Cessation No Change    Warm-up and Cool-down Performed as group-led instruction    Resistance Training Performed Yes    VAD Patient? No    PAD/SET Patient? No      Pain Assessment   Currently in Pain? No/denies    Multiple Pain Sites No             Capillary Blood Glucose: No results found for this or any previous visit (from the past 24 hours).    Social History   Tobacco Use  Smoking Status Former   Current packs/day: 0.50   Average packs/day: 0.5 packs/day for 53.0 years (26.5 ttl pk-yrs)   Types: Cigarettes  Smokeless Tobacco Never  Tobacco Comments   Quit smoking : 04/20/2023    Goals Met:  Proper associated with RPD/PD & O2 Sat Independence with exercise equipment Exercise tolerated well No report of concerns or symptoms today Strength training completed today  Goals Unmet:  Not Applicable  Comments: Service time is from 1010 to 1144.    Dr. Slater Staff is Medical Director for Pulmonary Rehab at [facility].

## 2023-06-14 ENCOUNTER — Encounter (HOSPITAL_COMMUNITY)
Admission: RE | Admit: 2023-06-14 | Discharge: 2023-06-14 | Disposition: A | Discharge status: Home / Self Care | Payer: 59 | Source: Ambulatory Visit | Attending: Pulmonary Disease | Admitting: Pulmonary Disease

## 2023-06-14 DIAGNOSIS — J449 Chronic obstructive pulmonary disease, unspecified: Secondary | ICD-10-CM

## 2023-06-14 NOTE — Progress Notes (Signed)
 Home Exercise Prescription I have reviewed a Home Exercise Prescription with Add D Missey. Ozzy is not currently exercising at home. We discussed different places to exercise. Deaundre wants to walk in the parking lot outside of a skating rink. I discussed safety precautions with Shahram. He agreed with the safety precautions. I encouraged Keean to walk 1 non-rehab day/wk for 30 min/day. He agreed with my recommendations. I encouraged Romond to increase his frequency as tolerated. Zaquan seems motivated to exercise at home and improve his functional capacity. The patient stated that their goals were to maintain health. We reviewed exercise guidelines, target heart rate during exercise, RPE Scale, weather conditions, endpoints for exercise, warmup and cool down. The patient is encouraged to come to me with any questions. I will continue to follow up with the patient to assist them with progression and safety. Spent 15 min with patient discussing home exercise plan and goals  Johnnie JINNY Moats, MS, ACSM-CEP 06/14/2023 3:34 PM

## 2023-06-14 NOTE — Progress Notes (Signed)
 Daily Session Note  Patient Details  Name: Larry [name] MRN: [id number] Date of Birth: [birth year] Referring Provider:   Conrad Ports Pulmonary Rehab Walk Test from 04/18/2023 in [facility]  Referring Provider Mannam       Encounter Date: 06/14/2023  Check In:  Session Check In - 06/14/23 1024       Check-In   Supervising physician immediately available to respond to emergencies [facility] MD immediately available    Physician(s) Lamarr Satterfield, NP    Location [facility]    Staff Present Ronal Levin, RN, BSN;Randi Midge HECKLE, ACSM-CEP, Exercise Physiologist;Larene Ascencio Claudene Neita Moats, MS, ACSM-CEP, Exercise Physiologist    Virtual Visit No    Medication changes reported     No    Fall or balance concerns reported    No    Tobacco Cessation No Change    Warm-up and Cool-down Performed as group-led instruction    Resistance Training Performed Yes    VAD Patient? No    PAD/SET Patient? No      Pain Assessment   Currently in Pain? No/denies    Multiple Pain Sites No             Capillary Blood Glucose: No results found for this or any previous visit (from the past 24 hours).    Social History   Tobacco Use  Smoking Status Former   Current packs/day: 0.50   Average packs/day: 0.5 packs/day for 53.0 years (26.5 ttl pk-yrs)   Types: Cigarettes  Smokeless Tobacco Never  Tobacco Comments   Quit smoking : 04/20/2023    Goals Met:  Proper associated with RPD/PD & O2 Sat Independence with exercise equipment Exercise tolerated well No report of concerns or symptoms today Strength training completed today  Goals Unmet:  Not Applicable  Comments: Service time is from 1010 to 1130.    Dr. Slater Staff is Medical Director for Pulmonary Rehab at [facility].

## 2023-06-16 ENCOUNTER — Encounter (HOSPITAL_COMMUNITY)
Admission: RE | Admit: 2023-06-16 | Discharge: 2023-06-16 | Disposition: A | Discharge status: Home / Self Care | Payer: 59 | Source: Ambulatory Visit | Attending: Pulmonary Disease | Admitting: Pulmonary Disease

## 2023-06-16 DIAGNOSIS — J449 Chronic obstructive pulmonary disease, unspecified: Secondary | ICD-10-CM | POA: Diagnosis not present

## 2023-06-16 NOTE — Progress Notes (Signed)
Daily Session Note  Patient Details  Name: Larry [name] MRN: [id number] Date of Birth: [birth year] Referring Provider:   Doristine Devoid Pulmonary Rehab Walk Test from 04/18/2023 in [facility]  Referring Provider Mannam       Encounter Date: 06/16/2023  Check In:  Session Check In - 06/16/23 1023       Check-In   Supervising physician immediately available to respond to emergencies [facility] MD immediately available    Physician(s) Joni Reining, NP    Location [facility]    Staff Present Essie Hart, RN, BSN;Guiselle Mian Dionisio Paschal, ACSM-CEP, Exercise Physiologist;Casey Charlean Sanfilippo, MS, ACSM-CEP, Exercise Physiologist    Virtual Visit No    Medication changes reported     No    Fall or balance concerns reported    No    Tobacco Cessation No Change    Warm-up and Cool-down Performed as group-led instruction    Resistance Training Performed Yes    VAD Patient? No    PAD/SET Patient? No      Pain Assessment   Currently in Pain? No/denies    Multiple Pain Sites No             Capillary Blood Glucose: No results found for this or any previous visit (from the past 24 hours).    Social History   Tobacco Use  Smoking Status Former   Current packs/day: 0.50   Average packs/day: 0.5 packs/day for 53.0 years (26.5 ttl pk-yrs)   Types: Cigarettes  Smokeless Tobacco Never  Tobacco Comments   Quit smoking : 04/20/2023    Goals Met:  Independence with exercise equipment Exercise tolerated well No report of concerns or symptoms today Strength training completed today  Goals Unmet:  Not Applicable  Comments: Service time is from 1007 to 1140.    Dr. Mechele Collin is Medical Director for Pulmonary Rehab at [facility].

## 2023-06-21 ENCOUNTER — Encounter (HOSPITAL_COMMUNITY)
Admission: RE | Admit: 2023-06-21 | Discharge: 2023-06-21 | Disposition: A | Discharge status: Home / Self Care | Payer: 59 | Source: Ambulatory Visit | Attending: Pulmonary Disease | Admitting: Pulmonary Disease

## 2023-06-21 VITALS — Wt 197.5 lb

## 2023-06-21 DIAGNOSIS — J449 Chronic obstructive pulmonary disease, unspecified: Secondary | ICD-10-CM

## 2023-06-21 NOTE — Progress Notes (Signed)
Daily Session Note  Patient Details  Name: Larry [name] MRN: [id number] Date of Birth: 10/31/1950 Referring Provider:   Doristine Devoid Pulmonary Rehab Walk Test from 04/18/2023 in [facility]  Referring Provider Mannam       Encounter Date: 06/21/2023  Check In:  Session Check In - 06/21/23 1128       Check-In   Supervising physician immediately available to respond to emergencies [facility] MD immediately available    Physician(s) Bernadene Person, NP    Location [facility]    Staff Present Essie Hart, RN, BSN;Reyce Lubeck Katrinka Blazing, Zella Richer, MS, ACSM-CEP, Exercise Physiologist;Johnny Hale Bogus, MS, Exercise Physiologist    Virtual Visit No    Medication changes reported     No    Fall or balance concerns reported    No    Tobacco Cessation No Change    Warm-up and Cool-down Performed as group-led instruction    Resistance Training Performed Yes    VAD Patient? No    PAD/SET Patient? No      Pain Assessment   Currently in Pain? No/denies    Multiple Pain Sites No             Capillary Blood Glucose: No results found for this or any previous visit (from the past 24 hours).   Exercise Prescription Changes - 06/21/23 1100       Response to Exercise   Blood Pressure (Admit) 122/58    Blood Pressure (Exercise) 170/70    Blood Pressure (Exit) 112/70    Heart Rate (Admit) 78 bpm    Heart Rate (Exercise) 122 bpm    Heart Rate (Exit) 92 bpm    Oxygen Saturation (Admit) 99 %    Oxygen Saturation (Exercise) 116 %    Oxygen Saturation (Exit) 99 %    Rating of Perceived Exertion (Exercise) 11    Perceived Dyspnea (Exercise) 1    Duration Continue with 30 min of aerobic exercise without signs/symptoms of physical distress.    Intensity THRR unchanged      Progression   Progression Continue to progress workloads to maintain intensity without signs/symptoms of physical distress.      Resistance Training    Training Prescription Yes    Weight blue bands    Reps 10-15    Time 10 Minutes      Recumbant Bike   Level 5    RPM 47    Watts 37    Minutes 15    METs 3      NuStep   Level 4    Minutes 15    METs 2.6             Social History   Tobacco Use  Smoking Status Former   Current packs/day: 0.50   Average packs/day: 0.5 packs/day for 53.0 years (26.5 ttl pk-yrs)   Types: Cigarettes  Smokeless Tobacco Never  Tobacco Comments   Quit smoking : 04/20/2023    Goals Met:  Proper associated with RPD/PD & O2 Sat Independence with exercise equipment Exercise tolerated well No report of concerns or symptoms today Strength training completed today  Goals Unmet:  Not Applicable  Comments: Service time is from 1009 to 1136.    Dr. Mechele Collin is Medical Director for Pulmonary Rehab at [facility].

## 2023-06-22 NOTE — Progress Notes (Signed)
Pulmonary Individual Treatment Plan  Patient Details  Name: Larry [name] MRN: [id number] Date of Birth: Jun 08, 1950 Referring Provider:   Doristine Devoid Pulmonary Rehab Walk Test from 04/18/2023 in [facility] [place] [facility]  Referring Provider Mannam       Initial Encounter Date:  Flowsheet Row Pulmonary Rehab Walk Test from 04/18/2023 in [facility] for Heart, Vascular, & Lung Health  Date 04/18/23       Visit Diagnosis: Stage 3 severe COPD by GOLD classification (HCC)  Patient's Home Medications on Admission:   Current Outpatient Medications:    albuterol (VENTOLIN HFA) 108 (90 Base) MCG/ACT inhaler, Inhale 2 puffs into the lungs every 6 (six) hours as needed., Disp: , Rfl:    amLODipine (NORVASC) 10 MG tablet, Take 1 tablet (10 mg total) by mouth daily., Disp: 90 tablet, Rfl: 1   aspirin 81 MG chewable tablet, Chew 81 mg by mouth daily., Disp: , Rfl:    Budeson-Glycopyrrol-Formoterol (BREZTRI AEROSPHERE) 160-9-4.8 MCG/ACT AERO, Inhale 2 puffs into the lungs in the morning and at bedtime., Disp: 3 each, Rfl: 3   buPROPion (WELLBUTRIN SR) 100 MG 12 hr tablet, TAKE 1 TABLET BY MOUTH TWICE A DAY, Disp: 180 tablet, Rfl: 2   ezetimibe (ZETIA) 10 MG tablet, Take 1 tablet (10 mg total) by mouth daily., Disp: 90 tablet, Rfl: 0   ferrous sulfate 325 (65 FE) MG tablet, Take 325 mg by mouth daily., Disp: , Rfl:    metFORMIN (GLUCOPHAGE) 500 MG tablet, , Disp: , Rfl:    metoprolol succinate (TOPROL-XL) 50 MG 24 hr tablet, Take 1 tablet (50 mg total) by mouth daily., Disp: 90 tablet, Rfl: 3   rosuvastatin (CRESTOR) 40 MG tablet, Take 1 tablet (40 mg total) by mouth daily., Disp: 30 tablet, Rfl: 5   varenicline (CHANTIX) 0.5 MG tablet, Take 1 tablet (0.5 mg total) by mouth 2 (two) times daily., Disp: 120 tablet, Rfl: 2  Past Medical History: Past Medical History:  Diagnosis Date   Angiodysplasia of cecum 06/01/2018    Cataract    Chronic cough    COPD (chronic obstructive pulmonary disease) (HCC)    Dyspnea on exertion    Full dentures    Hx of adenomatous colonic polyps 06/07/2018   Hyperlipidemia    Non-small cell carcinoma of left lung [facility]) oncologist-- dr Arbutus Ped   dx Nov 2015left upper lobe, 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, N1, M0)  s/p  left upper lobectomy with node dissection and neoadjuvant systemic chemotherapy   Productive cough    intermittantly   Prostate cancer [facility]) urologist-- dr wrenn/  oncologsit-  dr Kathrynn Running   Stage T1c , Gleason 4+3, PSA 4.1, vol 26.73cc--  External RXT complete and Radiactive seed implants on 04-03-2015   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) 05/22/2014        Tobacco Use: Social History   Tobacco Use  Smoking Status Former   Current packs/day: 0.50   Average packs/day: 0.5 packs/day for 53.0 years (26.5 ttl pk-yrs)   Types: Cigarettes  Smokeless Tobacco Never  Tobacco Comments   Quit smoking : 04/20/2023    Labs: Review Flowsheet  More data exists      Latest Ref Rng & Units 09/10/2014 03/30/2019 07/24/2019 07/26/2019 07/27/2019  Labs for ITP Cardiac and Pulmonary Rehab  Cholestrol 100 - 199 mg/dL - 829  - - -  LDL (calc) 0 - 99 mg/dL - 562  - - -  HDL-C >39 mg/dL - 47  - - -  Trlycerides 0 - 149 mg/dL - 527  - - -  PH, Arterial 7.350 - 7.450 7.425  - 7.408  7.300  7.223  7.179  7.418   PCO2 arterial 32.0 - 48.0 mmHg 44.5  - 36.3  58.2  62.7  68.3  38.3   Bicarbonate 20.0 - 28.0 mmol/L 29.2  - 22.5  28.6  25.8  25.4  24.3   TCO2 22 - 32 mmol/L 31  - - 30  28  27   -  Acid-base deficit 0.0 - 2.0 mmol/L - - 1.5  3.0  4.0  -  O2 Saturation % 98.0  - 97.9  100.0  89.0  87.0  96.2     Details       Multiple values from one day are sorted in reverse-chronological order         Capillary Blood Glucose: Lab Results  Component Value Date   GLUCAP 86 05/03/2023   GLUCAP 101 (H) 05/03/2023   GLUCAP 87 04/26/2023   GLUCAP  101 (H) 04/26/2023   GLUCAP 132 (H) 07/31/2019     Pulmonary Assessment Scores:  Pulmonary Assessment Scores     Row Name 04/18/23 1049         ADL UCSD   ADL Phase Entry     SOB Score total 29       CAT Score   CAT Score 24       mMRC Score   mMRC Score 3             UCSD: Self-administered rating of dyspnea associated with activities of daily living (ADLs) 6-point scale (0 = "not at all" to 5 = "maximal or unable to do because of breathlessness")  Scoring Scores range from 0 to 120.  Minimally important difference is 5 units  CAT: CAT can identify the health impairment of COPD patients and is better correlated with disease progression.  CAT has a scoring range of zero to 40. The CAT score is classified into four groups of low (less than 10), medium (10 - 20), high (21-30) and very high (31-40) based on the impact level of disease on health status. A CAT score over 10 suggests significant symptoms.  A worsening CAT score could be explained by an exacerbation, poor medication adherence, poor inhaler technique, or progression of COPD or comorbid conditions.  CAT MCID is 2 points  mMRC: mMRC (Modified Medical Research Council) Dyspnea Scale is used to assess the degree of baseline functional disability in patients of respiratory disease due to dyspnea. No minimal important difference is established. A decrease in score of 1 point or greater is considered a positive change.   Pulmonary Function Assessment:   Exercise Target Goals: Exercise Program Goal: Individual exercise prescription set using results from initial 6 min walk test and THRR while considering  patient's activity barriers and safety.   Exercise Prescription Goal: Initial exercise prescription builds to 30-45 minutes a day of aerobic activity, 2-3 days per week.  Home exercise guidelines will be given to patient during program as part of exercise prescription that the participant will  acknowledge.  Activity Barriers & Risk Stratification:  Activity Barriers & Cardiac Risk Stratification - 04/18/23 1045       Activity Barriers & Cardiac Risk Stratification   Activity Barriers Deconditioning;Muscular Weakness;Shortness of Breath             6 Minute Walk:  6 Minute Walk  Row Name 04/18/23 1208         6 Minute Walk   Phase Initial     Distance 875 feet     Walk Time 6 minutes     # of Rest Breaks 1  3:57-4:38     MPH 1.66     METS 2.46     RPE 13     Perceived Dyspnea  2     VO2 Peak 8.61     Symptoms No     Resting HR 87 bpm     Resting BP 130/74     Resting Oxygen Saturation  96 %     Exercise Oxygen Saturation  during 6 min walk 92 %     Max Ex. HR 117 bpm     Max Ex. BP 156/74     2 Minute Post BP 144/68       Interval HR   1 Minute HR 105     2 Minute HR 113     3 Minute HR 117     4 Minute HR 116     5 Minute HR 109     6 Minute HR 116     2 Minute Post HR 99     Interval Heart Rate? Yes       Interval Oxygen   Interval Oxygen? Yes     Baseline Oxygen Saturation % 96 %     1 Minute Oxygen Saturation % 98 %     1 Minute Liters of Oxygen 0 L     2 Minute Oxygen Saturation % 97 %     2 Minute Liters of Oxygen 0 L     3 Minute Oxygen Saturation % 94 %     3 Minute Liters of Oxygen 0 L     4 Minute Oxygen Saturation % 92 %     4 Minute Liters of Oxygen 0 L     5 Minute Oxygen Saturation % 92 %     5 Minute Liters of Oxygen 0 L     6 Minute Oxygen Saturation % 95 %     6 Minute Liters of Oxygen 0 L     2 Minute Post Oxygen Saturation % 97 %     2 Minute Post Liters of Oxygen 0 L              Oxygen Initial Assessment:  Oxygen Initial Assessment - 04/18/23 1049       Home Oxygen   Home Oxygen Device None    Sleep Oxygen Prescription None    Home Exercise Oxygen Prescription None    Home Resting Oxygen Prescription None      Initial 6 min Walk   Oxygen Used None      Program Oxygen Prescription   Program  Oxygen Prescription None      Intervention   Short Term Goals To learn and understand importance of monitoring SPO2 with pulse oximeter and demonstrate accurate use of the pulse oximeter.;To learn and understand importance of maintaining oxygen saturations>88%;To learn and demonstrate proper pursed lip breathing techniques or other breathing techniques. ;To learn and demonstrate proper use of respiratory medications    Long  Term Goals Maintenance of O2 saturations>88%;Compliance with respiratory medication;Verbalizes importance of monitoring SPO2 with pulse oximeter and return demonstration;Exhibits proper breathing techniques, such as pursed lip breathing or other method taught during program session;Demonstrates proper use of MDI's  Oxygen Re-Evaluation:  Oxygen Re-Evaluation     Row Name 05/16/23 0721             Program Oxygen Prescription   Program Oxygen Prescription None         Home Oxygen   Home Oxygen Device None       Sleep Oxygen Prescription None       Home Exercise Oxygen Prescription None       Home Resting Oxygen Prescription None         Goals/Expected Outcomes   Short Term Goals To learn and understand importance of monitoring SPO2 with pulse oximeter and demonstrate accurate use of the pulse oximeter.;To learn and understand importance of maintaining oxygen saturations>88%;To learn and demonstrate proper pursed lip breathing techniques or other breathing techniques. ;To learn and demonstrate proper use of respiratory medications       Long  Term Goals Maintenance of O2 saturations>88%;Compliance with respiratory medication;Verbalizes importance of monitoring SPO2 with pulse oximeter and return demonstration;Exhibits proper breathing techniques, such as pursed lip breathing or other method taught during program session;Demonstrates proper use of MDI's       Goals/Expected Outcomes Compliance and understanding of oxygen saturation monitoring and breathing  techniques to decrease shortness of breath.                Oxygen Discharge (Final Oxygen Re-Evaluation):  Oxygen Re-Evaluation - 05/16/23 0721       Program Oxygen Prescription   Program Oxygen Prescription None      Home Oxygen   Home Oxygen Device None    Sleep Oxygen Prescription None    Home Exercise Oxygen Prescription None    Home Resting Oxygen Prescription None      Goals/Expected Outcomes   Short Term Goals To learn and understand importance of monitoring SPO2 with pulse oximeter and demonstrate accurate use of the pulse oximeter.;To learn and understand importance of maintaining oxygen saturations>88%;To learn and demonstrate proper pursed lip breathing techniques or other breathing techniques. ;To learn and demonstrate proper use of respiratory medications    Long  Term Goals Maintenance of O2 saturations>88%;Compliance with respiratory medication;Verbalizes importance of monitoring SPO2 with pulse oximeter and return demonstration;Exhibits proper breathing techniques, such as pursed lip breathing or other method taught during program session;Demonstrates proper use of MDI's    Goals/Expected Outcomes Compliance and understanding of oxygen saturation monitoring and breathing techniques to decrease shortness of breath.             Initial Exercise Prescription:  Initial Exercise Prescription - 04/18/23 1200       Date of Initial Exercise RX and Referring Provider   Date 04/18/23    Referring Provider Mannam    Expected Discharge Date 07/14/23      Recumbant Bike   Level 1    Minutes 15    METs 2      NuStep   Level 1    SPM 60    Minutes 15    METs 2      Prescription Details   Frequency (times per week) 2    Duration Progress to 30 minutes of continuous aerobic without signs/symptoms of physical distress      Intensity   THRR 40-80% of Max Heartrate 59-118    Ratings of Perceived Exertion 11-13    Perceived Dyspnea 0-4      Progression    Progression Continue to progress workloads to maintain intensity without signs/symptoms of physical distress.      Resistance  Training   Training Prescription Yes    Weight blue bands    Reps 10-15             Perform Capillary Blood Glucose checks as needed.  Exercise Prescription Changes:   Exercise Prescription Changes     Row Name 04/26/23 1100 05/10/23 1200 05/24/23 1200 06/07/23 1100 06/14/23 1500     Response to Exercise   Blood Pressure (Admit) 120/74 142/70 138/60 130/70 --   Blood Pressure (Exercise) 146/70 160/70 170/68 186/70 --   Blood Pressure (Exit) 114/68 118/68 106/58 110/62 --   Heart Rate (Admit) 91 bpm 88 bpm 80 bpm 73 bpm --   Heart Rate (Exercise) 113 bpm 120 bpm 118 bpm 128 bpm --   Heart Rate (Exit) 99 bpm 98 bpm 93 bpm 92 bpm --   Oxygen Saturation (Admit) 98 % 98 % 99 % 99 % --   Oxygen Saturation (Exercise) 93 % 94 % 93 % 94 % --   Oxygen Saturation (Exit) 97 % 98 % 99 % 98 % --   Rating of Perceived Exertion (Exercise) 13 13 11 11  --   Perceived Dyspnea (Exercise) 1 1 1 1  --   Duration Progress to 30 minutes of  aerobic without signs/symptoms of physical distress Continue with 30 min of aerobic exercise without signs/symptoms of physical distress. Continue with 30 min of aerobic exercise without signs/symptoms of physical distress. Continue with 30 min of aerobic exercise without signs/symptoms of physical distress. --   Intensity THRR unchanged THRR unchanged THRR New  59-135 THRR unchanged --     Progression   Progression Continue to progress workloads to maintain intensity without signs/symptoms of physical distress. Continue to progress workloads to maintain intensity without signs/symptoms of physical distress. Continue to progress workloads to maintain intensity without signs/symptoms of physical distress. Continue to progress workloads to maintain intensity without signs/symptoms of physical distress. --     Paramedic  Prescription Yes Yes Yes Yes --   Weight blue bands blue bands blue bands blue bands --   Reps 10-15 10-15 10-15 10-15 --   Time 10 Minutes 10 Minutes 10 Minutes 10 Minutes --     Recumbant Bike   Level 1 3 3 4  --   RPM 55 -- -- -- --   Minutes 15 15 15 15  --   METs 1.9 2.8 2.6 3.2 --     NuStep   Level 2 2 3 3  --   SPM 83 -- 97 -- --   Minutes 15 15 15 15  --   METs 2.8 2.8 2.5 2.9 --     Home Exercise Plan   Plans to continue exercise at -- -- -- -- Home (comment)  walking outside   Frequency -- -- -- -- Add 1 additional day to program exercise sessions.   Initial Home Exercises Provided -- -- -- -- 06/14/23    Row Name 06/21/23 1100             Response to Exercise   Blood Pressure (Admit) 122/58       Blood Pressure (Exercise) 170/70       Blood Pressure (Exit) 112/70       Heart Rate (Admit) 78 bpm       Heart Rate (Exercise) 122 bpm       Heart Rate (Exit) 92 bpm       Oxygen Saturation (Admit) 99 %       Oxygen Saturation (Exercise)  116 %       Oxygen Saturation (Exit) 99 %       Rating of Perceived Exertion (Exercise) 11       Perceived Dyspnea (Exercise) 1       Duration Continue with 30 min of aerobic exercise without signs/symptoms of physical distress.       Intensity THRR unchanged         Progression   Progression Continue to progress workloads to maintain intensity without signs/symptoms of physical distress.         Resistance Training   Training Prescription Yes       Weight blue bands       Reps 10-15       Time 10 Minutes         Recumbant Bike   Level 5       RPM 47       Watts 37       Minutes 15       METs 3         NuStep   Level 4       Minutes 15       METs 2.6                Exercise Comments:   Exercise Comments     Row Name 04/26/23 1151 06/14/23 1529         Exercise Comments Pt completed first day of exercise. He exercised for 15 min on the recumbent bike and Nustep. Therin averged 1.9 METs at level 1 on the  recumbent bike and 2.8 METs at level 2 on the Nustep. He performed the warmup and cooldown standing without limitations. Discussed METs. Completed home ExRx. Bezaleel is not currently exercising at home. We discussed different places to exercise. Arthel wants to walk in the parking lot outside of a skating rink. I discussed safety precautions with Hamidou. He agreed with the safety precautions. I encouraged Sisto to walk 1 non-rehab day/wk for 30 min/day. He agreed with my recommendations. I encouraged Strummer to increase his frequency as tolerated. Ike seems motivated to exercise at home and improve his functional capacity.               Exercise Goals and Review:   Exercise Goals     Row Name 04/18/23 1045             Exercise Goals   Increase Physical Activity Yes       Intervention Provide advice, education, support and counseling about physical activity/exercise needs.;Develop an individualized exercise prescription for aerobic and resistive training based on initial evaluation findings, risk stratification, comorbidities and participant's personal goals.       Expected Outcomes Short Term: Attend rehab on a regular basis to increase amount of physical activity.;Long Term: Exercising regularly at least 3-5 days a week.;Long Term: Add in home exercise to make exercise part of routine and to increase amount of physical activity.       Increase Strength and Stamina Yes       Intervention Provide advice, education, support and counseling about physical activity/exercise needs.;Develop an individualized exercise prescription for aerobic and resistive training based on initial evaluation findings, risk stratification, comorbidities and participant's personal goals.       Expected Outcomes Short Term: Increase workloads from initial exercise prescription for resistance, speed, and METs.;Short Term: Perform resistance training exercises routinely during rehab and add in resistance  training at home;Long Term: Improve cardiorespiratory fitness, muscular endurance  and strength as measured by increased METs and functional capacity ( )       Able to understand and use rate of perceived exertion (RPE) scale Yes       Intervention Provide education and explanation on how to use RPE scale       Expected Outcomes Short Term: Able to use RPE daily in rehab to express subjective intensity level;Long Term:  Able to use RPE to guide intensity level when exercising independently       Able to understand and use Dyspnea scale Yes       Intervention Provide education and explanation on how to use Dyspnea scale       Expected Outcomes Short Term: Able to use Dyspnea scale daily in rehab to express subjective sense of shortness of breath during exertion;Long Term: Able to use Dyspnea scale to guide intensity level when exercising independently       Knowledge and understanding of Target Heart Rate Range (THRR) Yes       Intervention Provide education and explanation of THRR including how the numbers were predicted and where they are located for reference       Expected Outcomes Short Term: Able to state/look up THRR;Short Term: Able to use daily as guideline for intensity in rehab;Long Term: Able to use THRR to govern intensity when exercising independently       Understanding of Exercise Prescription Yes       Intervention Provide education, explanation, and written materials on patient's individual exercise prescription       Expected Outcomes Short Term: Able to explain program exercise prescription;Long Term: Able to explain home exercise prescription to exercise independently                Exercise Goals Re-Evaluation :  Exercise Goals Re-Evaluation     Row Name 04/27/23 7371 05/16/23 0721 06/16/23 0800         Exercise Goal Re-Evaluation   Exercise Goals Review Increase Physical Activity;Able to understand and use Dyspnea scale;Understanding of Exercise  Prescription;Increase Strength and Stamina;Knowledge and understanding of Target Heart Rate Range (THRR);Able to understand and use rate of perceived exertion (RPE) scale Increase Physical Activity;Able to understand and use Dyspnea scale;Understanding of Exercise Prescription;Increase Strength and Stamina;Knowledge and understanding of Target Heart Rate Range (THRR);Able to understand and use rate of perceived exertion (RPE) scale Increase Physical Activity;Able to understand and use Dyspnea scale;Understanding of Exercise Prescription;Increase Strength and Stamina;Knowledge and understanding of Target Heart Rate Range (THRR);Able to understand and use rate of perceived exertion (RPE) scale     Comments Tyliek has completed 1 exercise sessions. He exercises for 15 min on the recumbent bike and Nustep. Draco averges 1.9 METs at level 1 on the recumbent bike and 2.8 METs at level 2 on the Nustep. He performed the warmup and cooldown standing without limitations. It is too soon to notate any discernable progressions. Will continue to monitor and progress as able. Maximas has completed 5 exercise sessions. He exercises for 15 min on the recumbent bike and Nustep. Rex averages 2.7 METs at level 3 on the recumbent bike and 2.9 METs at level 2 on the Nustep. He performs the warmup and cooldown standing without limitations. Teal has increased his level for the recumbent bike but not Nustep. METs have also increased. Jasmeet tolerates the recumbent bike progressions well. He also needed a few verbal cues during the cooldown. Daizon is able to perform the cooldown independently now. Will increase Nustep level next time.  Will continue to monitor and progress as able. Kohler has completed 14 exercise sessions. He exercises for 15 min on the recumbent bike and Nustep. Ritchard averages 3.1 METs at level 4 on the recumbent bike and 2.8 METs at level 3 on the Nustep. He performs the warmup and cooldown  standing without limitations. He has increased his workload for both exercise modes as METs have also increased.     Expected Outcomes Through exercise at rehab and home, the patient will decrease shortness of breath with daily activities and feel confident in carrying out an exercise regimen at home. Through exercise at rehab and home, the patient will decrease shortness of breath with daily activities and feel confident in carrying out an exercise regimen at home. Through exercise at rehab and home, the patient will decrease shortness of breath with daily activities and feel confident in carrying out an exercise regimen at home.              Discharge Exercise Prescription (Final Exercise Prescription Changes):  Exercise Prescription Changes - 06/21/23 1100       Response to Exercise   Blood Pressure (Admit) 122/58    Blood Pressure (Exercise) 170/70    Blood Pressure (Exit) 112/70    Heart Rate (Admit) 78 bpm    Heart Rate (Exercise) 122 bpm    Heart Rate (Exit) 92 bpm    Oxygen Saturation (Admit) 99 %    Oxygen Saturation (Exercise) 116 %    Oxygen Saturation (Exit) 99 %    Rating of Perceived Exertion (Exercise) 11    Perceived Dyspnea (Exercise) 1    Duration Continue with 30 min of aerobic exercise without signs/symptoms of physical distress.    Intensity THRR unchanged      Progression   Progression Continue to progress workloads to maintain intensity without signs/symptoms of physical distress.      Resistance Training   Training Prescription Yes    Weight blue bands    Reps 10-15    Time 10 Minutes      Recumbant Bike   Level 5    RPM 47    Watts 37    Minutes 15    METs 3      NuStep   Level 4    Minutes 15    METs 2.6             Nutrition:  Target Goals: Understanding of nutrition guidelines, daily intake of sodium 1500mg , cholesterol 200mg , calories 30% from fat and 7% or less from saturated fats, daily to have 5 or more servings of fruits and  vegetables.  Biometrics:    Nutrition Therapy Plan and Nutrition Goals:   Nutrition Assessments:  MEDIFICTS Score Key: >=70 Need to make dietary changes  40-70 Heart Healthy Diet <= 40 Therapeutic Level Cholesterol Diet   Picture Your Plate Scores: <16 Unhealthy dietary pattern with much room for improvement. 41-50 Dietary pattern unlikely to meet recommendations for good health and room for improvement. 51-60 More healthful dietary pattern, with some room for improvement.  >60 Healthy dietary pattern, although there may be some specific behaviors that could be improved.    Nutrition Goals Re-Evaluation:   Nutrition Goals Discharge (Final Nutrition Goals Re-Evaluation):   Psychosocial: Target Goals: Acknowledge presence or absence of significant depression and/or stress, maximize coping skills, provide positive support system. Participant is able to verbalize types and ability to use techniques and skills needed for reducing stress and depression.  Initial Review & Psychosocial  Screening:  Initial Psych Review & Screening - 04/18/23 1051       Initial Review   Current issues with None Identified      Family Dynamics   Good Support System? Yes      Barriers   Psychosocial barriers to participate in program There are no identifiable barriers or psychosocial needs.      Screening Interventions   Interventions Provide feedback about the scores to participant             Quality of Life Scores:  Scores of 19 and below usually indicate a poorer quality of life in these areas.  A difference of  2-3 points is a clinically meaningful difference.  A difference of 2-3 points in the total score of the Quality of Life Index has been associated with significant improvement in overall quality of life, self-image, physical symptoms, and general health in studies assessing change in quality of life.  PHQ-9: Review Flowsheet  More data exists      04/18/2023 04/18/2015  02/28/2015 02/21/2015 02/06/2015  Depression screen PHQ 2/9  Decreased Interest 0 0 0 0 0  Down, Depressed, Hopeless 0 0 0 0 0  PHQ - 2 Score 0 0 0 0 0  Altered sleeping 1 - - - -  Tired, decreased energy 1 - - - -  Change in appetite 0 - - - -  Feeling bad or failure about yourself  0 - - - -  Trouble concentrating 0 - - - -  Moving slowly or fidgety/restless 0 - - - -  Suicidal thoughts 0 - - - -  PHQ-9 Score 2 - - - -  Difficult doing work/chores Not difficult at all - - - -   Interpretation of Total Score  Total Score Depression Severity:  1-4 = Minimal depression, 5-9 = Mild depression, 10-14 = Moderate depression, 15-19 = Moderately severe depression, 20-27 = Severe depression   Psychosocial Evaluation and Intervention:  Psychosocial Evaluation - 04/18/23 1051       Psychosocial Evaluation & Interventions   Interventions Encouraged to exercise with the program and follow exercise prescription    Comments Veniamin denies any barriers or psy/soc concerns at this time    Expected Outcomes For Diomedes to exercise in Pulm Rehab without any barriers    Continue Psychosocial Services  No Follow up required             Psychosocial Re-Evaluation:  Psychosocial Re-Evaluation     Row Name 04/22/23 1351 05/16/23 1155           Psychosocial Re-Evaluation   Current issues with None Identified None Identified      Comments Garrit has not started the program yet. No new psychosocial barriers or concerns since orientation on 04/18/23. Shoichi continues to deny any psychosocial barriers or concerns.      Expected Outcomes For Shango to participate in PR free of any psychosocial barriers or concerns For Kincaid to participate in PR free of any psychosocial barriers or concerns      Interventions Encouraged to attend Pulmonary Rehabilitation for the exercise Encouraged to attend Pulmonary Rehabilitation for the exercise      Continue Psychosocial Services  No Follow up required  No Follow up required               Psychosocial Discharge (Final Psychosocial Re-Evaluation):  Psychosocial Re-Evaluation - 05/16/23 1155       Psychosocial Re-Evaluation   Current issues with None Identified  Comments Braydenn continues to deny any psychosocial barriers or concerns.    Expected Outcomes For Jatavion to participate in PR free of any psychosocial barriers or concerns    Interventions Encouraged to attend Pulmonary Rehabilitation for the exercise    Continue Psychosocial Services  No Follow up required             Education: Education Goals: Education classes will be provided on a weekly basis, covering required topics. Participant will state understanding/return demonstration of topics presented.  Learning Barriers/Preferences:   Education Topics: Know Your Numbers Group instruction that is supported by a PowerPoint presentation. Instructor discusses importance of knowing and understanding resting, exercise, and post-exercise oxygen saturation, heart rate, and blood pressure. Oxygen saturation, heart rate, blood pressure, rating of perceived exertion, and dyspnea are reviewed along with a normal range for these values.  Flowsheet Row PULMONARY REHAB CHRONIC OBSTRUCTIVE PULMONARY DISEASE from 06/02/2023 in [facility] Heart, Vascular, & Lung Health  Date 06/02/23  Educator EP  Instruction Review Code 1- Verbalizes Understanding       Exercise for the Pulmonary Patient Group instruction that is supported by a PowerPoint presentation. Instructor discusses benefits of exercise, core components of exercise, frequency, duration, and intensity of an exercise routine, importance of utilizing pulse oximetry during exercise, safety while exercising, and options of places to exercise outside of rehab.  Flowsheet Row PULMONARY REHAB CHRONIC OBSTRUCTIVE PULMONARY DISEASE from 05/26/2023 in [facility] for Heart,  Vascular, & Lung Health  Date 05/26/23  Educator EP  Instruction Review Code 1- Verbalizes Understanding       MET Level  Group instruction provided by PowerPoint, verbal discussion, and written material to support subject matter. Instructor reviews what METs are and how to increase METs.    Pulmonary Medications Verbally interactive group education provided by instructor with focus on inhaled medications and proper administration. Flowsheet Row PULMONARY REHAB CHRONIC OBSTRUCTIVE PULMONARY DISEASE from 05/19/2023 in [facility] for Heart, Vascular, & Lung Health  Date 05/19/23  Educator RT  Instruction Review Code 1- Verbalizes Understanding       Anatomy and Physiology of the Respiratory System Group instruction provided by PowerPoint, verbal discussion, and written material to support subject matter. Instructor reviews respiratory cycle and anatomical components of the respiratory system and their functions. Instructor also reviews differences in obstructive and restrictive respiratory diseases with examples of each.  Flowsheet Row PULMONARY REHAB CHRONIC OBSTRUCTIVE PULMONARY DISEASE from 05/12/2023 in [facility] for Heart, Vascular, & Lung Health  Date 05/12/23  Educator RT  Instruction Review Code 1- Verbalizes Understanding       Oxygen Safety Group instruction provided by PowerPoint, verbal discussion, and written material to support subject matter. There is an overview of "What is Oxygen" and "Why do we need it".  Instructor also reviews how to create a safe environment for oxygen use, the importance of using oxygen as prescribed, and the risks of noncompliance. There is a brief discussion on traveling with oxygen and resources the patient may utilize. Flowsheet Row PULMONARY REHAB CHRONIC OBSTRUCTIVE PULMONARY DISEASE from 06/09/2023 in [facility] for Heart, Vascular, & Lung Health  Date 06/09/23   Educator RN  Instruction Review Code 1- Verbalizes Understanding       Oxygen Use Group instruction provided by PowerPoint, verbal discussion, and written material to discuss how supplemental oxygen is prescribed and different types of oxygen supply systems. Resources for more  information are provided.  Flowsheet Row PULMONARY REHAB CHRONIC OBSTRUCTIVE PULMONARY DISEASE from 06/16/2023 in [facility] for Heart, Vascular, & Lung Health  Date 06/16/23  Educator RT  Instruction Review Code 1- Verbalizes Understanding       Breathing Techniques Group instruction that is supported by demonstration and informational handouts. Instructor discusses the benefits of pursed lip and diaphragmatic breathing and detailed demonstration on how to perform both.     Risk Factor Reduction Group instruction that is supported by a PowerPoint presentation. Instructor discusses the definition of a risk factor, different risk factors for pulmonary disease, and how the heart and lungs work together.   Pulmonary Diseases Group instruction provided by PowerPoint, verbal discussion, and written material to support subject matter. Instructor gives an overview of the different type of pulmonary diseases. There is also a discussion on risk factors and symptoms as well as ways to manage the diseases. Flowsheet Row PULMONARY REHAB CHRONIC OBSTRUCTIVE PULMONARY DISEASE from 05/05/2023 in [facility] for Heart, Vascular, & Lung Health  Date 05/05/23  Educator RT  Instruction Review Code 1- Verbalizes Understanding       Stress and Energy Conservation Group instruction provided by PowerPoint, verbal discussion, and written material to support subject matter. Instructor gives an overview of stress and the impact it can have on the body. Instructor also reviews ways to reduce stress. There is also a discussion on energy conservation and ways to conserve energy throughout  the day.   Warning Signs and Symptoms Group instruction provided by PowerPoint, verbal discussion, and written material to support subject matter. Instructor reviews warning signs and symptoms of stroke, heart attack, cold and flu. Instructor also reviews ways to prevent the spread of infection.   Other Education Group or individual verbal, written, or video instructions that support the educational goals of the pulmonary rehab program.    Knowledge Questionnaire Score:  Knowledge Questionnaire Score - 04/18/23 1110       Knowledge Questionnaire Score   Pre Score 12/18             Core Components/Risk Factors/Patient Goals at Admission:  Personal Goals and Risk Factors at Admission - 04/18/23 1052       Core Components/Risk Factors/Patient Goals on Admission   Tobacco Cessation Yes    Intervention Assist the participant in steps to quit. Provide individualized education and counseling about committing to Tobacco Cessation, relapse prevention, and pharmacological support that can be provided by physician.;Education officer, environmental, assist with locating and accessing local/national Quit Smoking programs, and support quit date choice.    Expected Outcomes Short Term: Will demonstrate readiness to quit, by selecting a quit date.;Long Term: Complete abstinence from all tobacco products for at least 12 months from quit date.;Short Term: Will quit all tobacco product use, adhering to prevention of relapse plan.    Improve shortness of breath with ADL's Yes    Intervention Provide education, individualized exercise plan and daily activity instruction to help decrease symptoms of SOB with activities of daily living.    Expected Outcomes Short Term: Improve cardiorespiratory fitness to achieve a reduction of symptoms when performing ADLs;Long Term: Be able to perform more ADLs without symptoms or delay the onset of symptoms    Increase knowledge of respiratory medications and ability to  use respiratory devices properly  Yes    Intervention Provide education and demonstration as needed of appropriate use of medications, inhalers, and oxygen therapy.    Expected Outcomes Short  Term: Achieves understanding of medications use. Understands that oxygen is a medication prescribed by physician. Demonstrates appropriate use of inhaler and oxygen therapy.;Long Term: Maintain appropriate use of medications, inhalers, and oxygen therapy.    Diabetes Yes    Intervention Provide education about signs/symptoms and action to take for hypo/hyperglycemia.;Provide education about proper nutrition, including hydration, and aerobic/resistive exercise prescription along with prescribed medications to achieve blood glucose in normal ranges: Fasting glucose 65-99 mg/dL    Expected Outcomes Short Term: Participant verbalizes understanding of the signs/symptoms and immediate care of hyper/hypoglycemia, proper foot care and importance of medication, aerobic/resistive exercise and nutrition plan for blood glucose control.             Core Components/Risk Factors/Patient Goals Review:   Goals and Risk Factor Review     Row Name 04/22/23 1353 04/26/23 1150 05/16/23 1200 06/13/23 1528 06/15/23 1441     Core Components/Risk Factors/Patient Goals Review   Personal Goals Review Tobacco Cessation;Improve shortness of breath with ADL's;Develop more efficient breathing techniques such as purse lipped breathing and diaphragmatic breathing and practicing self-pacing with activity.;Increase knowledge of respiratory medications and ability to use respiratory devices properly.;Diabetes Tobacco Cessation Tobacco Cessation;Improve shortness of breath with ADL's;Develop more efficient breathing techniques such as purse lipped breathing and diaphragmatic breathing and practicing self-pacing with activity.;Increase knowledge of respiratory medications and ability to use respiratory devices properly.;Diabetes Tobacco  Cessation;Improve shortness of breath with ADL's Tobacco Cessation   Review Zachery is scheduled to start PR on 11/26. Goal progressing for tobacco cessation. Goal progressing for improving shortness of breath with ADL's. Goal progressing for developing more efficient breathing techniques such as purse lipped breathing and diaphragmatic breathing; and practicing self-pacing with activity. Goal progressing for increase knowledge or respiratory medications and ability to use respiratory devices properly. Goal progressing for diabetes. Discussed smoking cessation with pt today. He sts he is on chantix and nicotine patch currently and is losing his taste for smoking. He sts he is cutting down on his cigarettes and the last cigarette he had was 12 pm yesterday. Congratulated pt on not smoking for 24 hours. Discussed tips for distraction/substition. Gave pt Quitting Smoking resource sheet. Encouraged him to talk with [place] for questions or concerns. Goal progressing for tobacco cessation. Goal progressing for improving shortness of breath with ADL's. Pt is exercising on RA and keeping sats >88%. Goal progressing for developing more efficient breathing techniques such as purse lipped breathing and diaphragmatic breathing; and practicing self-pacing with activity. Goal met for increase knowledge or respiratory medications and ability to use respiratory devices properly. Pt demonstrated proper use of MDI to staff respiratory therapist.  Goal met for diabetes. Pt met with his primary doctor and was placed on Metformin for diabetes. His blood sugars have been stable before and after exercise. Goal progressing for tobacco cessation. Staff have spoken with Million about his smoking and he states that he is currently using Chantix and nicotine patches. His craving for cigrettes has decreased and he has currently gone 24 hrs without smoking. Goal progressing for improving shortness of breath with ADL's. Pt is exercising on RA and  keeping sats >88%. Goal met for developing more efficient breathing techniques such as purse lipped breathing and diaphragmatic breathing; and practicing self-pacing with activity. Ahad is able to demonstrate purse lip breathing when he gets short of breath. He also knows how to pace himself based on the RPE scale. We will continue to monitor Clarences progress throughout the program. Followed up with pt regarding  his smoking status. He has not smoked since November and is doing well. Congratulated pt and encouraged him to talk with [place] if any struggles.   Expected Outcomes To quit smoking, improve shortness of breath with ADL's, develop more efficient breathing techniques such as purse lipped breathing and diaphragmatic breathing; and practicing self-pacing with activity, increase knowledge or respiratory medications and ability to use respiratory devices properly and have better control of his diabetes. To quit smoking completely. To quit smoking, improve shortness of breath with ADL's and develop more efficient breathing techniques such as purse lipped breathing and diaphragmatic breathing; and practicing self-pacing with activity. To quit smoking and  improve shortness of breath with ADL's. Met. Quit smoking 11/25            Core Components/Risk Factors/Patient Goals at Discharge (Final Review):   Goals and Risk Factor Review - 06/15/23 1441       Core Components/Risk Factors/Patient Goals Review   Personal Goals Review Tobacco Cessation    Review Followed up with pt regarding his smoking status. He has not smoked since November and is doing well. Congratulated pt and encouraged him to talk with [place] if any struggles.    Expected Outcomes Met. Quit smoking 11/25             ITP Comments: Pt is making expected progress toward Pulmonary Rehab goals after completing 16 session(s). Recommend continued exercise, life style modification, education, and utilization of breathing techniques to  increase stamina and strength, while also decreasing shortness of breath with exertion.  Dr. Mechele Collin is Medical Director for Pulmonary Rehab at [facility].

## 2023-06-23 ENCOUNTER — Encounter (HOSPITAL_COMMUNITY)
Admission: RE | Admit: 2023-06-23 | Discharge: 2023-06-23 | Disposition: A | Discharge status: Home / Self Care | Payer: 59 | Source: Ambulatory Visit | Attending: Pulmonary Disease | Admitting: Pulmonary Disease

## 2023-06-23 DIAGNOSIS — J449 Chronic obstructive pulmonary disease, unspecified: Secondary | ICD-10-CM

## 2023-06-23 NOTE — Progress Notes (Signed)
Daily Session Note  Patient Details  Name: DEMITRIS NIHILL MRN: [id number] Date of Birth: 1950/12/18 Referring Provider:   Doristine Devoid Pulmonary Rehab Walk Test from 04/18/2023 in [facility] for Heart, Vascular, & Lung Health  Referring Provider Mannam       Encounter Date: 06/23/2023  Check In:  Session Check In - 06/23/23 1111       Check-In   Supervising physician immediately available to respond to emergencies [facility] MD immediately available    Physician(s) Neila Gear, NP    Location [facility]    Staff Present Essie Hart, RN, Doris Cheadle, MS, ACSM-CEP, Exercise Physiologist;David Manus Gunning, MS, ACSM-CEP, CCRP, Exercise Physiologist;Paddy Neis Glenetta Borg, MS, Exercise Physiologist    Virtual Visit No    Medication changes reported     No    Fall or balance concerns reported    No    Tobacco Cessation No Change    Warm-up and Cool-down Performed as group-led instruction    Resistance Training Performed Yes    VAD Patient? No    PAD/SET Patient? No      Pain Assessment   Currently in Pain? No/denies    Multiple Pain Sites No             Capillary Blood Glucose: No results found for this or any previous visit (from the past 24 hours).    Social History   Tobacco Use  Smoking Status Former   Current packs/day: 0.50   Average packs/day: 0.5 packs/day for 53.0 years (26.5 ttl pk-yrs)   Types: Cigarettes  Smokeless Tobacco Never  Tobacco Comments   Quit smoking : 04/20/2023    Goals Met:  Proper associated with RPD/PD & O2 Sat Independence with exercise equipment Exercise tolerated well No report of concerns or symptoms today Strength training completed today  Goals Unmet:  Not Applicable  Comments: Service time is from 1009 to 1145.    Dr. Mechele Collin is Medical Director for Pulmonary Rehab at [facility].

## 2023-06-28 ENCOUNTER — Encounter (HOSPITAL_COMMUNITY)
Admission: RE | Admit: 2023-06-28 | Discharge: 2023-06-28 | Disposition: A | Discharge status: Home / Self Care | Payer: 59 | Source: Ambulatory Visit | Attending: Pulmonary Disease | Admitting: Pulmonary Disease

## 2023-06-28 DIAGNOSIS — J449 Chronic obstructive pulmonary disease, unspecified: Secondary | ICD-10-CM

## 2023-06-28 NOTE — Progress Notes (Signed)
Daily Session Note  Patient Details  Name: Larry [name] MRN: [id number] Date of Birth: July 30, 1950 Referring Provider:   Doristine Devoid Pulmonary Rehab Walk Test from 04/18/2023 in [facility] for Heart, Vascular, & Lung Health  Referring Provider Mannam       Encounter Date: 06/28/2023  Check In:  Session Check In - 06/28/23 1031       Check-In   Supervising physician immediately available to respond to emergencies [facility] MD immediately available    Physician(s) Tereso Newcomer, PA    Location [facility]    Staff Present Essie Hart, RN, Doris Cheadle, MS, ACSM-CEP, Exercise Physiologist;Sakari Alkhatib Sonnie Alamo, MS, ACSM-CEP, Exercise Physiologist    Virtual Visit No    Medication changes reported     No    Fall or balance concerns reported    No    Tobacco Cessation No Change    Warm-up and Cool-down Performed as group-led instruction    Resistance Training Performed Yes    VAD Patient? No    PAD/SET Patient? No      Pain Assessment   Currently in Pain? No/denies    Multiple Pain Sites No             Capillary Blood Glucose: No results found for this or any previous visit (from the past 24 hours).    Social History   Tobacco Use  Smoking Status Former   Current packs/day: 0.50   Average packs/day: 0.5 packs/day for 53.0 years (26.5 ttl pk-yrs)   Types: Cigarettes  Smokeless Tobacco Never  Tobacco Comments   Quit smoking : 04/20/2023    Goals Met:  Proper associated with RPD/PD & O2 Sat Independence with exercise equipment Exercise tolerated well No report of concerns or symptoms today Strength training completed today  Goals Unmet:  Not Applicable  Comments: Service time is from 1006 to 1130.    Dr. Mechele Collin is Medical Director for Pulmonary Rehab at [facility].

## 2023-06-30 ENCOUNTER — Encounter (HOSPITAL_COMMUNITY)
Admission: RE | Admit: 2023-06-30 | Discharge: 2023-06-30 | Disposition: A | Discharge status: Home / Self Care | Payer: 59 | Source: Ambulatory Visit | Attending: Pulmonary Disease | Admitting: Pulmonary Disease

## 2023-06-30 DIAGNOSIS — J449 Chronic obstructive pulmonary disease, unspecified: Secondary | ICD-10-CM | POA: Diagnosis not present

## 2023-06-30 NOTE — Progress Notes (Signed)
Daily Session Note  Patient Details  Name: Larry [name] MRN: [id number] Date of Birth: [birth year] Referring Provider:   Doristine Devoid Pulmonary Rehab Walk Test from 04/18/2023 in [facility] for Heart, Vascular, & Lung Health  Referring Provider Mannam       Encounter Date: 06/30/2023  Check In:  Session Check In - 06/30/23 1027       Check-In   Supervising physician immediately available to respond to emergencies [facility] MD immediately available    Physician(s) Edd Fabian, NP    Location [facility]    Staff Present Essie Hart, RN, Doris Cheadle, MS, ACSM-CEP, Exercise Physiologist;Casey Katrinka Blazing, RT;Jetta Walker BS, ACSM-CEP, Exercise Physiologist    Virtual Visit No    Medication changes reported     No    Fall or balance concerns reported    No    Tobacco Cessation No Change    Warm-up and Cool-down Performed as group-led instruction    Resistance Training Performed Yes    VAD Patient? No    PAD/SET Patient? No      Pain Assessment   Currently in Pain? No/denies    Multiple Pain Sites No             Capillary Blood Glucose: No results found for this or any previous visit (from the past 24 hours).    Social History   Tobacco Use  Smoking Status Former   Current packs/day: 0.50   Average packs/day: 0.5 packs/day for 53.0 years (26.5 ttl pk-yrs)   Types: Cigarettes  Smokeless Tobacco Never  Tobacco Comments   Quit smoking : 04/20/2023    Goals Met:  Independence with exercise equipment Exercise tolerated well No report of concerns or symptoms today Strength training completed today  Goals Unmet:  Not Applicable  Comments: Service time is from 1010 to 1140.    Dr. Mechele Collin is Medical Director for Pulmonary Rehab at [facility].

## 2023-07-05 ENCOUNTER — Encounter (HOSPITAL_COMMUNITY)
Admission: RE | Admit: 2023-07-05 | Discharge: 2023-07-05 | Disposition: A | Discharge status: Home / Self Care | Payer: 59 | Source: Ambulatory Visit | Attending: Pulmonary Disease | Admitting: Pulmonary Disease

## 2023-07-05 DIAGNOSIS — J449 Chronic obstructive pulmonary disease, unspecified: Secondary | ICD-10-CM | POA: Diagnosis present

## 2023-07-05 NOTE — Progress Notes (Signed)
Daily Session Note  Patient Details  Name: Larry [name] MRN: [id number] Date of Birth: [birth year] Referring Provider:   Doristine Devoid Pulmonary Rehab Walk Test from 04/18/2023 in [facility] for Heart, Vascular, & Lung Health  Referring Provider Mannam       Encounter Date: 07/05/2023  Check In:  Session Check In - 07/05/23 4098       Check-In   Supervising physician immediately available to respond to emergencies [facility] MD immediately available    Physician(s) Edd Fabian, NP    Location [facility]    Staff Present Essie Hart, RN, Doris Cheadle, MS, ACSM-CEP, Exercise Physiologist;Casey Katrinka Blazing, RT    Virtual Visit No    Medication changes reported     No    Fall or balance concerns reported    No    Tobacco Cessation No Change    Warm-up and Cool-down Performed as group-led instruction    Resistance Training Performed Yes    VAD Patient? No    PAD/SET Patient? No      Pain Assessment   Currently in Pain? No/denies    Multiple Pain Sites No             Capillary Blood Glucose: No results found for this or any previous visit (from the past 24 hours).    Social History   Tobacco Use  Smoking Status Former   Current packs/day: 0.50   Average packs/day: 0.5 packs/day for 53.0 years (26.5 ttl pk-yrs)   Types: Cigarettes  Smokeless Tobacco Never  Tobacco Comments   Quit smoking : 04/20/2023    Goals Met:  Independence with exercise equipment Exercise tolerated well No report of concerns or symptoms today Strength training completed today  Goals Unmet:  Not Applicable  Comments: Service time is from 0806 to 0925    Dr. Mechele Collin is Medical Director for Pulmonary Rehab at [facility].

## 2023-07-07 ENCOUNTER — Encounter (HOSPITAL_COMMUNITY)
Admission: RE | Admit: 2023-07-07 | Discharge: 2023-07-07 | Disposition: A | Discharge status: Home / Self Care | Payer: 59 | Source: Ambulatory Visit | Attending: Pulmonary Disease | Admitting: Pulmonary Disease

## 2023-07-07 DIAGNOSIS — J449 Chronic obstructive pulmonary disease, unspecified: Secondary | ICD-10-CM

## 2023-07-07 NOTE — Progress Notes (Signed)
 Daily Session Note  Patient Details  Name: Larry [name] MRN: [id number] Date of Birth: 11/01/1950 Referring Provider:   Conrad Ports Pulmonary Rehab Walk Test from 04/18/2023 in [facility]  Referring Provider Mannam       Encounter Date: 07/07/2023  Check In:  Session Check In - 07/07/23 1021       Check-In   Supervising physician immediately available to respond to emergencies [facility] MD immediately available    Physician(s) Rosabel Mose, NP    Location [facility]    Staff Present Ronal Levin, RN, Maud Moats, MS, ACSM-CEP, Exercise Physiologist;Shakala Marlatt Claudene Idelia Aris BS, ACSM-CEP, Exercise Physiologist    Virtual Visit No    Medication changes reported     No    Fall or balance concerns reported    No    Tobacco Cessation No Change    Warm-up and Cool-down Performed as group-led instruction    Resistance Training Performed Yes    VAD Patient? No    PAD/SET Patient? No      Pain Assessment   Currently in Pain? No/denies    Multiple Pain Sites No             Capillary Blood Glucose: No results found for this or any previous visit (from the past 24 hours).    Social History   Tobacco Use  Smoking Status Former   Current packs/day: 0.50   Average packs/day: 0.5 packs/day for 53.0 years (26.5 ttl pk-yrs)   Types: Cigarettes  Smokeless Tobacco Never  Tobacco Comments   Quit smoking : 04/20/2023    Goals Met:  Proper associated with RPD/PD & O2 Sat Independence with exercise equipment Exercise tolerated well No report of concerns or symptoms today Strength training completed today  Goals Unmet:  Not Applicable  Comments: Service time is from 1010 to 1128.    Dr. Slater Staff is Medical Director for Pulmonary Rehab at [facility].

## 2023-07-12 ENCOUNTER — Encounter (HOSPITAL_COMMUNITY)
Admission: RE | Admit: 2023-07-12 | Discharge: 2023-07-12 | Disposition: A | Discharge status: Home / Self Care | Payer: 59 | Source: Ambulatory Visit | Attending: Pulmonary Disease | Admitting: Pulmonary Disease

## 2023-07-12 DIAGNOSIS — J449 Chronic obstructive pulmonary disease, unspecified: Secondary | ICD-10-CM

## 2023-07-12 NOTE — Progress Notes (Signed)
Daily Session Note  Patient Details  Name: Larry [name] MRN: [id number] Date of Birth: March 09, 1951 Referring Provider:   Doristine Devoid Pulmonary Rehab Walk Test from 04/18/2023 in [facility]  Referring Provider Mannam       Encounter Date: 07/12/2023  Check In:  Session Check In - 07/12/23 1031       Check-In   Supervising physician immediately available to respond to emergencies [facility] MD immediately available    Physician(s) Eligha Bridegroom, NP    [facility]    Staff Present Essie Hart, RN, Doris Cheadle, MS, ACSM-CEP, Exercise Physiologist;Kyannah Climer Erin Sons BS, ACSM-CEP, Exercise Physiologist    Virtual Visit No    Medication changes reported     No    Fall or balance concerns reported    No    Tobacco Cessation No Change    Warm-up and Cool-down Performed as group-led instruction    Resistance Training Performed Yes    VAD Patient? No    PAD/SET Patient? No      Pain Assessment   Currently in Pain? No/denies             Capillary Blood Glucose: No results found for this or any previous visit (from the past 24 hours).    Social History   Tobacco Use  Smoking Status Former   Current packs/day: 0.50   Average packs/day: 0.5 packs/day for 53.0 years (26.5 ttl pk-yrs)   Types: Cigarettes  Smokeless Tobacco Never  Tobacco Comments   Quit smoking : 04/20/2023    Goals Met:  Proper associated with RPD/PD & O2 Sat Independence with exercise equipment Exercise tolerated well No report of concerns or symptoms today Strength training completed today  Goals Unmet:  Not Applicable  Comments: Service time is from 1009 to 1136.    Dr. Mechele Collin is Medical Director for Pulmonary Rehab at [facility].

## 2023-07-14 ENCOUNTER — Encounter (HOSPITAL_COMMUNITY)
Admission: RE | Admit: 2023-07-14 | Discharge: 2023-07-14 | Disposition: A | Discharge status: Home / Self Care | Payer: 59 | Source: Ambulatory Visit | Attending: Pulmonary Disease | Admitting: Pulmonary Disease

## 2023-07-14 DIAGNOSIS — J449 Chronic obstructive pulmonary disease, unspecified: Secondary | ICD-10-CM

## 2023-07-14 NOTE — Progress Notes (Signed)
Daily Session Note  Patient Details  Name: Larry [name] MRN: [id number] Date of Birth: 1950/11/28 Referring Provider:   Doristine Devoid Pulmonary Rehab Walk Test from 04/18/2023 in [facility]  Referring Provider Mannam       Encounter Date: 07/14/2023  Check In:  Session Check In - 07/14/23 1024       Check-In   Supervising physician immediately available to respond to emergencies [facility] MD immediately available    Physician(s) Rise Paganini, NP    Location [facility]    Staff Present Essie Hart, RN, Doris Cheadle, MS, ACSM-CEP, Exercise Physiologist;Casey Katrinka Blazing, RT    Virtual Visit No    Medication changes reported     No    Fall or balance concerns reported    No    Tobacco Cessation No Change    Warm-up and Cool-down Performed as group-led instruction    Resistance Training Performed Yes    VAD Patient? No    PAD/SET Patient? No      Pain Assessment   Currently in Pain? No/denies    Multiple Pain Sites No             Capillary Blood Glucose: No results found for this or any previous visit (from the past 24 hours).    Social History   Tobacco Use  Smoking Status Former   Current packs/day: 0.50   Average packs/day: 0.5 packs/day for 53.0 years (26.5 ttl pk-yrs)   Types: Cigarettes  Smokeless Tobacco Never  Tobacco Comments   Quit smoking : 04/20/2023    Goals Met:  Proper associated with RPD/PD & O2 Sat Exercise tolerated well No report of concerns or symptoms today Strength training completed today  Goals Unmet:  Not Applicable  Comments: Service time is from 1006 to 1147.    Dr. Mechele Collin is Medical Director for Pulmonary Rehab at [facility].

## 2023-07-14 NOTE — Progress Notes (Signed)
   07/14/23 1535  6 Minute Walk  Phase Discharge  Distance 1050 feet  Distance % Change 20 %  Distance Feet Change 175 ft  Walk Time 6 minutes  # of Rest Breaks 1 (2:15-3:00)  MPH 1.99  METS 3.02  RPE 9  Perceived Dyspnea  1  VO2 Peak 10.58  Symptoms No  Resting HR 85 bpm  Resting BP 122/72  Resting Oxygen Saturation  98 %  Exercise Oxygen Saturation  during 6 min walk 87 %  Max Ex. HR 129 bpm  Max Ex. BP 180/70  2 Minute Post BP 134/68  Interval HR  1 Minute HR 111  2 Minute HR 123  3 Minute HR 120  4 Minute HR 120  5 Minute HR 131  6 Minute HR 129  2 Minute Post HR 105  Interval Heart Rate? Yes  Interval Oxygen  Interval Oxygen? Yes  Baseline Oxygen Saturation % 98 %  1 Minute Oxygen Saturation % 99 %  1 Minute Liters of Oxygen 0 L  2 Minute Oxygen Saturation % 95 %  2 Minute Liters of Oxygen 0 L  3 Minute Oxygen Saturation % 89 % (3:13- 87%)  3 Minute Liters of Oxygen 0 L (increased 2L)  4 Minute Oxygen Saturation % 98 %  4 Minute Liters of Oxygen 2 L  5 Minute Oxygen Saturation % 96 %  5 Minute Liters of Oxygen 2 L  6 Minute Oxygen Saturation % 96 %  6 Minute Liters of Oxygen 2 L  2 Minute Post Oxygen Saturation % 96 %  2 Minute Post Liters of Oxygen 2 L

## 2023-07-15 NOTE — Progress Notes (Signed)
Discharge Progress Report  Patient Details  Name: Larry [name] MRN: [id number] Date of Birth: 1951-05-29 Referring Provider:   Doristine Devoid Pulmonary Rehab Walk Test from 04/18/2023 in [facility]  Referring Provider Mannam        Number of Visits: 77  Reason for Discharge:  Patient has met program and personal goals.  Smoking History:  Social History   Tobacco Use  Smoking Status Former   Current packs/day: 0.50   Average packs/day: 0.5 packs/day for 53.0 years (26.5 ttl pk-yrs)   Types: Cigarettes  Smokeless Tobacco Never  Tobacco Comments   Quit smoking : 04/20/2023    Diagnosis:  Stage 3 severe COPD by GOLD classification (HCC)  ADL UCSD:  Pulmonary Assessment Scores     Row Name 04/18/23 1049 07/12/23 1523       ADL UCSD   ADL Phase Entry Exit    SOB Score total 29 21      CAT Score   CAT Score 24 12      mMRC Score   mMRC Score 3 --             Initial Exercise Prescription:  Initial Exercise Prescription - 04/18/23 1200       Date of Initial Exercise RX and Referring Provider   Date 04/18/23    Referring Provider Mannam    Expected Discharge Date 07/14/23      Recumbant Bike   Level 1    Minutes 15    METs 2      NuStep   Level 1    SPM 60    Minutes 15    METs 2      Prescription Details   Frequency (times per week) 2    Duration Progress to 30 minutes of continuous aerobic without signs/symptoms of physical distress      Intensity   THRR 40-80% of Max Heartrate 59-118    Ratings of Perceived Exertion 11-13    Perceived Dyspnea 0-4      Progression   Progression Continue to progress workloads to maintain intensity without signs/symptoms of physical distress.      Resistance Training   Training Prescription Yes    Weight blue bands    Reps 10-15             Discharge Exercise Prescription (Final Exercise Prescription Changes):  Exercise Prescription  Changes - 06/21/23 1100       Response to Exercise   Blood Pressure (Admit) 122/58    Blood Pressure (Exercise) 170/70    Blood Pressure (Exit) 112/70    Heart Rate (Admit) 78 bpm    Heart Rate (Exercise) 122 bpm    Heart Rate (Exit) 92 bpm    Oxygen Saturation (Admit) 99 %    Oxygen Saturation (Exercise) 116 %    Oxygen Saturation (Exit) 99 %    Rating of Perceived Exertion (Exercise) 11    Perceived Dyspnea (Exercise) 1    Duration Continue with 30 min of aerobic exercise without signs/symptoms of physical distress.    Intensity THRR unchanged      Progression   Progression Continue to progress workloads to maintain intensity without signs/symptoms of physical distress.      Resistance Training   Training Prescription Yes    Weight blue bands    Reps 10-15    Time 10 Minutes      Recumbant Bike   Level 5  RPM 47    Watts 37    Minutes 15    METs 3      NuStep   Level 4    Minutes 15    METs 2.6             Functional Capacity:  6 Minute Walk     Row Name 04/18/23 1208 07/14/23 1535       6 Minute Walk   Phase Initial Discharge    Distance 875 feet 1050 feet    Distance % Change -- 20 %    Distance Feet Change -- 175 ft    Walk Time 6 minutes 6 minutes    # of Rest Breaks 1  3:57-4:38 1  2:15-3:00    MPH 1.66 1.99    METS 2.46 3.02    RPE 13 9    Perceived Dyspnea  2 1    VO2 Peak 8.61 10.58    Symptoms No No    Resting HR 87 bpm 85 bpm    Resting BP 130/74 122/72    Resting Oxygen Saturation  96 % 98 %    Exercise Oxygen Saturation  during 6 min walk 92 % 87 %    Max Ex. HR 117 bpm 129 bpm    Max Ex. BP 156/74 180/70    2 Minute Post BP 144/68 134/68      Interval HR   1 Minute HR 105 111    2 Minute HR 113 123    3 Minute HR 117 120    4 Minute HR 116 120    5 Minute HR 109 131    6 Minute HR 116 129    2 Minute Post HR 99 105    Interval Heart Rate? Yes Yes      Interval Oxygen   Interval Oxygen? Yes Yes    Baseline Oxygen  Saturation % 96 % 98 %    1 Minute Oxygen Saturation % 98 % 99 %    1 Minute Liters of Oxygen 0 L 0 L    2 Minute Oxygen Saturation % 97 % 95 %    2 Minute Liters of Oxygen 0 L 0 L    3 Minute Oxygen Saturation % 94 % 89 %  3:13- 87%    3 Minute Liters of Oxygen 0 L 0 L  increased 2L    4 Minute Oxygen Saturation % 92 % 98 %    4 Minute Liters of Oxygen 0 L 2 L    5 Minute Oxygen Saturation % 92 % 96 %    5 Minute Liters of Oxygen 0 L 2 L    6 Minute Oxygen Saturation % 95 % 96 %    6 Minute Liters of Oxygen 0 L 2 L    2 Minute Post Oxygen Saturation % 97 % 96 %    2 Minute Post Liters of Oxygen 0 L 2 L             Psychological, QOL, Others - Outcomes: PHQ 2/9:    07/12/2023    3:21 PM 04/18/2023   11:06 AM 04/18/2015    9:58 AM 02/28/2015    9:52 AM 02/21/2015    9:59 AM  Depression screen PHQ 2/9  Decreased Interest 0 0 0 0 0  Down, Depressed, Hopeless 0 0 0 0 0  PHQ - 2 Score 0 0 0 0 0  Altered sleeping 0 1     Tired,  decreased energy 0 1     Change in appetite 0 0     Feeling bad or failure about yourself  0 0     Trouble concentrating 0 0     Moving slowly or fidgety/restless 0 0     Suicidal thoughts 0 0     PHQ-9 Score 0 2     Difficult doing work/chores Not difficult at all Not difficult at all       Quality of Life:   Personal Goals: Goals established at orientation with interventions provided to work toward goal.  Personal Goals and Risk Factors at Admission - 04/18/23 1052       Core Components/Risk Factors/Patient Goals on Admission   Tobacco Cessation Yes    Intervention Assist the participant in steps to quit. Provide individualized education and counseling about committing to Tobacco Cessation, relapse prevention, and pharmacological support that can be provided by physician.;Education officer, environmental, assist with locating and accessing local/national Quit Smoking programs, and support quit date choice.    Expected Outcomes Short Term: Will  demonstrate readiness to quit, by selecting a quit date.;Long Term: Complete abstinence from all tobacco products for at least 12 months from quit date.;Short Term: Will quit all tobacco product use, adhering to prevention of relapse plan.    Improve shortness of breath with ADL's Yes    Intervention Provide education, individualized exercise plan and daily activity instruction to help decrease symptoms of SOB with activities of daily living.    Expected Outcomes Short Term: Improve cardiorespiratory fitness to achieve a reduction of symptoms when performing ADLs;Long Term: Be able to perform more ADLs without symptoms or delay the onset of symptoms    Increase knowledge of respiratory medications and ability to use respiratory devices properly  Yes    Intervention Provide education and demonstration as needed of appropriate use of medications, inhalers, and oxygen therapy.    Expected Outcomes Short Term: Achieves understanding of medications use. Understands that oxygen is a medication prescribed by physician. Demonstrates appropriate use of inhaler and oxygen therapy.;Long Term: Maintain appropriate use of medications, inhalers, and oxygen therapy.    Diabetes Yes    Intervention Provide education about signs/symptoms and action to take for hypo/hyperglycemia.;Provide education about proper nutrition, including hydration, and aerobic/resistive exercise prescription along with prescribed medications to achieve blood glucose in normal ranges: Fasting glucose 65-99 mg/dL    Expected Outcomes Short Term: Participant verbalizes understanding of the signs/symptoms and immediate care of hyper/hypoglycemia, proper foot care and importance of medication, aerobic/resistive exercise and nutrition plan for blood glucose control.              Personal Goals Discharge:  Goals and Risk Factor Review     Row Name 04/22/23 1353 04/26/23 1150 05/16/23 1200 06/13/23 1528 06/15/23 1441     Core Components/Risk  Factors/Patient Goals Review   Personal Goals Review Tobacco Cessation;Improve shortness of breath with ADL's;Develop more efficient breathing techniques such as purse lipped breathing and diaphragmatic breathing and practicing self-pacing with activity.;Increase knowledge of respiratory medications and ability to use respiratory devices properly.;Diabetes Tobacco Cessation Tobacco Cessation;Improve shortness of breath with ADL's;Develop more efficient breathing techniques such as purse lipped breathing and diaphragmatic breathing and practicing self-pacing with activity.;Increase knowledge of respiratory medications and ability to use respiratory devices properly.;Diabetes Tobacco Cessation;Improve shortness of breath with ADL's Tobacco Cessation   Review Bern is scheduled to start PR on 11/26. Goal progressing for tobacco cessation. Goal progressing for improving shortness of breath with  ADL's. Goal progressing for developing more efficient breathing techniques such as purse lipped breathing and diaphragmatic breathing; and practicing self-pacing with activity. Goal progressing for increase knowledge or respiratory medications and ability to use respiratory devices properly. Goal progressing for diabetes. Discussed smoking cessation with pt today. He sts he is on chantix and nicotine patch currently and is losing his taste for smoking. He sts he is cutting down on his cigarettes and the last cigarette he had was 12 pm yesterday. Congratulated pt on not smoking for 24 hours. Discussed tips for distraction/substition. Gave pt Quitting Smoking resource sheet. Encouraged him to talk with [place] for questions or concerns. Goal progressing for tobacco cessation. Goal progressing for improving shortness of breath with ADL's. Pt is exercising on RA and keeping sats >88%. Goal progressing for developing more efficient breathing techniques such as purse lipped breathing and diaphragmatic breathing; and practicing  self-pacing with activity. Goal met for increase knowledge or respiratory medications and ability to use respiratory devices properly. Pt demonstrated proper use of MDI to staff respiratory therapist.  Goal met for diabetes. Pt met with his primary doctor and was placed on Metformin for diabetes. His blood sugars have been stable before and after exercise. Goal progressing for tobacco cessation. Staff have spoken with Fareed about his smoking and he states that he is currently using Chantix and nicotine patches. His craving for cigrettes has decreased and he has currently gone 24 hrs without smoking. Goal progressing for improving shortness of breath with ADL's. Pt is exercising on RA and keeping sats >88%. Goal met for developing more efficient breathing techniques such as purse lipped breathing and diaphragmatic breathing; and practicing self-pacing with activity. Junaid is able to demonstrate purse lip breathing when he gets short of breath. He also knows how to pace himself based on the RPE scale. We will continue to monitor Clarences progress throughout the program. Followed up with pt regarding his smoking status. He has not smoked since November and is doing well. Congratulated pt and encouraged him to talk with [place] if any struggles.   Expected Outcomes To quit smoking, improve shortness of breath with ADL's, develop more efficient breathing techniques such as purse lipped breathing and diaphragmatic breathing; and practicing self-pacing with activity, increase knowledge or respiratory medications and ability to use respiratory devices properly and have better control of his diabetes. To quit smoking completely. To quit smoking, improve shortness of breath with ADL's and develop more efficient breathing techniques such as purse lipped breathing and diaphragmatic breathing; and practicing self-pacing with activity. To quit smoking and  improve shortness of breath with ADL's. Met. Quit smoking 11/25     Row Name 07/15/23 0908             Core Components/Risk Factors/Patient Goals Review   Personal Goals Review Tobacco Cessation;Improve shortness of breath with ADL's       Review [place] graduated from the Pulmonary Rehab program on 07/14/23. Pt maintained good attendance and progressed during his participation in rehab as evidenced by increased METs and workload. Goal met for improving SOB with ADL's. His SOB scale decreased from 29 to 21. Goal met for tobacco cessation. Jeter quit smoking in November. Aycen was a pleasure to have in the program. We wish him the best.                Exercise Goals and Review:  Exercise Goals     Row Name 04/18/23 1045  Exercise Goals   Increase Physical Activity Yes       Intervention Provide advice, education, support and counseling about physical activity/exercise needs.;Develop an individualized exercise prescription for aerobic and resistive training based on initial evaluation findings, risk stratification, comorbidities and participant's personal goals.       Expected Outcomes Short Term: Attend rehab on a regular basis to increase amount of physical activity.;Long Term: Exercising regularly at least 3-5 days a week.;Long Term: Add in home exercise to make exercise part of routine and to increase amount of physical activity.       Increase Strength and Stamina Yes       Intervention Provide advice, education, support and counseling about physical activity/exercise needs.;Develop an individualized exercise prescription for aerobic and resistive training based on initial evaluation findings, risk stratification, comorbidities and participant's personal goals.       Expected Outcomes Short Term: Increase workloads from initial exercise prescription for resistance, speed, and METs.;Short Term: Perform resistance training exercises routinely during rehab and add in resistance training at home;Long Term: Improve cardiorespiratory  fitness, muscular endurance and strength as measured by increased METs and functional capacity ( )       Able to understand and use rate of perceived exertion (RPE) scale Yes       Intervention Provide education and explanation on how to use RPE scale       Expected Outcomes Short Term: Able to use RPE daily in rehab to express subjective intensity level;Long Term:  Able to use RPE to guide intensity level when exercising independently       Able to understand and use Dyspnea scale Yes       Intervention Provide education and explanation on how to use Dyspnea scale       Expected Outcomes Short Term: Able to use Dyspnea scale daily in rehab to express subjective sense of shortness of breath during exertion;Long Term: Able to use Dyspnea scale to guide intensity level when exercising independently       Knowledge and understanding of Target Heart Rate Range (THRR) Yes       Intervention Provide education and explanation of THRR including how the numbers were predicted and where they are located for reference       Expected Outcomes Short Term: Able to state/look up THRR;Short Term: Able to use daily as guideline for intensity in rehab;Long Term: Able to use THRR to govern intensity when exercising independently       Understanding of Exercise Prescription Yes       Intervention Provide education, explanation, and written materials on patient's individual exercise prescription       Expected Outcomes Short Term: Able to explain program exercise prescription;Long Term: Able to explain home exercise prescription to exercise independently                Exercise Goals Re-Evaluation:  Exercise Goals Re-Evaluation     Row Name 04/27/23 0823 05/16/23 0721 06/16/23 0800 07/14/23 1541       Exercise Goal Re-Evaluation   Exercise Goals Review Increase Physical Activity;Able to understand and use Dyspnea scale;Understanding of Exercise Prescription;Increase Strength and Stamina;Knowledge and  understanding of Target Heart Rate Range (THRR);Able to understand and use rate of perceived exertion (RPE) scale Increase Physical Activity;Able to understand and use Dyspnea scale;Understanding of Exercise Prescription;Increase Strength and Stamina;Knowledge and understanding of Target Heart Rate Range (THRR);Able to understand and use rate of perceived exertion (RPE) scale Increase Physical Activity;Able to understand and use Dyspnea scale;Understanding  of Exercise Prescription;Increase Strength and Stamina;Knowledge and understanding of Target Heart Rate Range (THRR);Able to understand and use rate of perceived exertion (RPE) scale Increase Physical Activity;Able to understand and use Dyspnea scale;Understanding of Exercise Prescription;Increase Strength and Stamina;Knowledge and understanding of Target Heart Rate Range (THRR);Able to understand and use rate of perceived exertion (RPE) scale    Comments [place] has completed 1 exercise sessions. He exercises for 15 min on the recumbent bike and Nustep. Herman averges 1.9 METs at level 1 on the recumbent bike and 2.8 METs at level 2 on the Nustep. He performed the warmup and cooldown standing without limitations. It is too soon to notate any discernable progressions. Will continue to monitor and progress as able. Rucker has completed 5 exercise sessions. He exercises for 15 min on the recumbent bike and Nustep. Cam averages 2.7 METs at level 3 on the recumbent bike and 2.9 METs at level 2 on the Nustep. He performs the warmup and cooldown standing without limitations. Drequan has increased his level for the recumbent bike but not Nustep. METs have also increased. Brenner tolerates the recumbent bike progressions well. He also needed a few verbal cues during the cooldown. Bubba is able to perform the cooldown independently now. Will increase Nustep level next time. Will continue to monitor and progress as able. Jasaun has completed 14 exercise  sessions. He exercises for 15 min on the recumbent bike and Nustep. Delane averages 3.1 METs at level 4 on the recumbent bike and 2.8 METs at level 3 on the Nustep. He performs the warmup and cooldown standing without limitations. He has increased his workload for both exercise modes as METs have also increased. Blaire has completed 23 exercise sessions. His peak METs were 3.6 on the recumbent bike and 2.9 on the Nustep. He plans to continue to exercise at home.    Expected Outcomes Through exercise at rehab and home, the patient will decrease shortness of breath with daily activities and feel confident in carrying out an exercise regimen at home. Through exercise at rehab and home, the patient will decrease shortness of breath with daily activities and feel confident in carrying out an exercise regimen at home. Through exercise at rehab and home, the patient will decrease shortness of breath with daily activities and feel confident in carrying out an exercise regimen at home. Through exercise at rehab and home, the patient will decrease shortness of breath with daily activities and feel confident in carrying out an exercise regimen at home.             Nutrition & Weight - Outcomes:    Nutrition:   Nutrition Discharge:   Education Questionnaire Score:  Knowledge Questionnaire Score - 07/12/23 1521       Knowledge Questionnaire Score   Post Score 13/18             Goals reviewed with patient; copy given to patient.

## 2023-09-14 ENCOUNTER — Other Ambulatory Visit: Payer: Self-pay | Admitting: Internal Medicine

## 2023-09-14 DIAGNOSIS — I714 Abdominal aortic aneurysm, without rupture, unspecified: Secondary | ICD-10-CM

## 2023-11-22 ENCOUNTER — Ambulatory Visit (INDEPENDENT_AMBULATORY_CARE_PROVIDER_SITE_OTHER): Admitting: Podiatry

## 2023-11-22 DIAGNOSIS — B351 Tinea unguium: Secondary | ICD-10-CM | POA: Diagnosis not present

## 2023-11-22 DIAGNOSIS — M79672 Pain in left foot: Secondary | ICD-10-CM

## 2023-11-22 DIAGNOSIS — M79671 Pain in right foot: Secondary | ICD-10-CM | POA: Diagnosis not present

## 2023-11-22 DIAGNOSIS — E1151 Type 2 diabetes mellitus with diabetic peripheral angiopathy without gangrene: Secondary | ICD-10-CM

## 2023-11-22 DIAGNOSIS — I70209 Unspecified atherosclerosis of native arteries of extremities, unspecified extremity: Secondary | ICD-10-CM

## 2023-11-22 NOTE — Progress Notes (Signed)
 Patient presents for evaluation and treatment of tenderness and some redness around nails feet.  Tenderness around toes with walking and wearing shoes.  Physical exam:  General appearance: Alert, pleasant, and in no acute distress.  Vascular: Pedal pulses: DP 1/4 B/L, PT 0/4 B/L.  Moderate edema lower legs bilaterally  Neurological:    Dermatologic:  Nails thickened, disfigured, discolored 1-5 BL with subungual debris.  Redness and hypertrophic nail folds along nail folds bilaterally but no signs of drainage or infection.  Musculoskeletal:  Hallux valgus bilaterally.  Hammertoe deformities 2 through 5 bilaterally   Diagnosis: 1. Painful onychomycotic nails 1 through 5 bilaterally. 2. Pain toes 1 through 5 bilaterally. 3.  Diabetes mellitus type 2 with PVD  Plan: Debrided onychomycotic nails 1 through 5 bilaterally.  Return 3 months RFC

## 2023-12-26 ENCOUNTER — Inpatient Hospital Stay: Payer: 59 | Attending: Internal Medicine

## 2023-12-26 ENCOUNTER — Ambulatory Visit (HOSPITAL_COMMUNITY)
Admission: RE | Admit: 2023-12-26 | Discharge: 2023-12-26 | Disposition: A | Discharge status: Home / Self Care | Source: Ambulatory Visit | Attending: Physician Assistant | Admitting: Physician Assistant

## 2023-12-26 DIAGNOSIS — C3492 Malignant neoplasm of unspecified part of left bronchus or lung: Secondary | ICD-10-CM | POA: Insufficient documentation

## 2023-12-26 DIAGNOSIS — Z9221 Personal history of antineoplastic chemotherapy: Secondary | ICD-10-CM | POA: Diagnosis not present

## 2023-12-26 DIAGNOSIS — Z85118 Personal history of other malignant neoplasm of bronchus and lung: Secondary | ICD-10-CM | POA: Diagnosis present

## 2023-12-26 DIAGNOSIS — Z923 Personal history of irradiation: Secondary | ICD-10-CM | POA: Insufficient documentation

## 2023-12-26 LAB — CBC WITH DIFFERENTIAL (CANCER CENTER ONLY)
Abs Immature Granulocytes: 0.02 K/uL (ref 0.00–0.07)
Basophils Absolute: 0.1 K/uL (ref 0.0–0.1)
Basophils Relative: 1 %
Eosinophils Absolute: 0.4 K/uL (ref 0.0–0.5)
Eosinophils Relative: 5 %
HCT: 35.4 % — ABNORMAL LOW (ref 39.0–52.0)
Hemoglobin: 12.1 g/dL — ABNORMAL LOW (ref 13.0–17.0)
Immature Granulocytes: 0 %
Lymphocytes Relative: 26 %
Lymphs Abs: 2.1 K/uL (ref 0.7–4.0)
MCH: 26 pg (ref 26.0–34.0)
MCHC: 34.2 g/dL (ref 30.0–36.0)
MCV: 76 fL — ABNORMAL LOW (ref 80.0–100.0)
Monocytes Absolute: 1 K/uL (ref 0.1–1.0)
Monocytes Relative: 12 %
Neutro Abs: 4.6 K/uL (ref 1.7–7.7)
Neutrophils Relative %: 56 %
Platelet Count: 225 K/uL (ref 150–400)
RBC: 4.66 MIL/uL (ref 4.22–5.81)
RDW: 15 % (ref 11.5–15.5)
WBC Count: 8.2 K/uL (ref 4.0–10.5)
nRBC: 0 % (ref 0.0–0.2)

## 2023-12-26 LAB — CMP (CANCER CENTER ONLY)
ALT: 15 U/L (ref 0–44)
AST: 18 U/L (ref 15–41)
Albumin: 4.1 g/dL (ref 3.5–5.0)
Alkaline Phosphatase: 70 U/L (ref 38–126)
Anion gap: 7 (ref 5–15)
BUN: 9 mg/dL (ref 8–23)
CO2: 28 mmol/L (ref 22–32)
Calcium: 9.3 mg/dL (ref 8.9–10.3)
Chloride: 102 mmol/L (ref 98–111)
Creatinine: 0.85 mg/dL (ref 0.61–1.24)
GFR, Estimated: >60 mL/min (ref >60)
Glucose, Bld: 134 mg/dL — ABNORMAL HIGH (ref 70–99)
Potassium: 4 mmol/L (ref 3.5–5.1)
Sodium: 137 mmol/L (ref 135–145)
Total Bilirubin: 0.3 mg/dL (ref 0.0–1.2)
Total Protein: 7.8 g/dL (ref 6.5–8.1)

## 2023-12-26 MED ORDER — IOHEXOL 300 MG/ML  SOLN
75.0000 mL | Freq: Once | INTRAMUSCULAR | Status: AC | PRN
  Administered 2023-12-26: 75 mL via INTRAVENOUS

## 2024-01-02 ENCOUNTER — Inpatient Hospital Stay: Payer: 59 | Attending: Internal Medicine | Admitting: Internal Medicine

## 2024-01-02 VITALS — BP 145/75 | HR 86 | Temp 97.8°F | Resp 17 | Ht 71.0 in | Wt 205.0 lb

## 2024-01-02 DIAGNOSIS — Z85118 Personal history of other malignant neoplasm of bronchus and lung: Secondary | ICD-10-CM | POA: Insufficient documentation

## 2024-01-02 DIAGNOSIS — Z902 Acquired absence of lung [part of]: Secondary | ICD-10-CM | POA: Diagnosis not present

## 2024-01-02 DIAGNOSIS — C3490 Malignant neoplasm of unspecified part of unspecified bronchus or lung: Secondary | ICD-10-CM | POA: Diagnosis not present

## 2024-01-02 DIAGNOSIS — Z08 Encounter for follow-up examination after completed treatment for malignant neoplasm: Secondary | ICD-10-CM | POA: Insufficient documentation

## 2024-01-02 NOTE — Progress Notes (Signed)
 [facility] [phone]   [phone]  OFFICE PROGRESS NOTE  Valentin Skates, DO [address] [place] [address]  DIAGNOSIS:  1) Stage IIA (T1a, N1, M0) non-small cell lung cancer, squamous cell carcinoma diagnosed in November 2015, presented with left suprahilar soft tissue mass. 2) history of prostate adenocarcinoma  PRIOR THERAPY:  1) Neoadjuvant systemic chemotherapy with carboplatin  for AUC of 5 and paclitaxel  175 MG/M2 every 3 weeks with Neulasta  support. First dose 06/13/2014. Status post 3 cycle. 2) Status post Left video-assisted thoracoscopy, Thoracoscopic left upper lobectomy, Mediastinal lymph node dissection under the care of Dr. Kerrin on 09/09/2014. 3) right lower lobe superior segmentectomy as well as right middle lobe wedge resection under the care of Dr. Kerrin in February 2021.  CURRENT THERAPY: Observation.  INTERVAL HISTORY: Larry [name] 73 y.o. male returns to the clinic today for annual follow-up visit.Discussed the use of AI scribe software for clinical note transcription with the patient, who gave verbal consent to proceed.  History of Present Illness Larry [name] is a 73 year old male with stage 2A non-small cell lung cancer who presents for evaluation with a repeat CT scan for restaging of his disease.  He was diagnosed with stage 2A non-small cell lung cancer in November 2015 and underwent neoadjuvant systemic chemotherapy followed by a left upper lobectomy. In April 2016, he had a lymph node dissection. In February 2021, he experienced disease recurrence in the right lung, leading to a right lower lobe superior segmentectomy and right middle lobe wedge dissection. Since then, he has been under observation.  He reports no new symptoms since his last visit. No chest pain, breathing issues, hemoptysis, recent weight loss, nausea, vomiting, or headaches. He mentions a previous swelling on the right side that  appeared about a year ago but has since decreased.     MEDICAL HISTORY: Past Medical History:  Diagnosis Date   Angiodysplasia of cecum 06/01/2018   Cataract    Chronic cough    COPD (chronic obstructive pulmonary disease) (HCC)    Dyspnea on exertion    Full dentures    Hx of adenomatous colonic polyps 06/07/2018   Hyperlipidemia    Non-small cell carcinoma of left lung [facility]) oncologist-- dr sherrod   dx Nov 2015left upper lobe, 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, N1, M0)  s/p  left upper lobectomy with node dissection and neoadjuvant systemic chemotherapy   Productive cough    intermittantly   Prostate cancer [facility]) urologist-- dr wrenn/  oncologsit-  dr patrcia   Stage T1c , Gleason 4+3, PSA 4.1, vol 26.73cc--  External RXT complete and Radiactive seed implants on 04-03-2015   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) 05/22/2014        ALLERGIES:  has no known allergies.  MEDICATIONS:  Current Outpatient Medications  Medication Sig Dispense Refill   albuterol  (VENTOLIN  HFA) 108 (90 Base) MCG/ACT inhaler Inhale 2 puffs into the lungs every 6 (six) hours as needed.     amLODipine  (NORVASC ) 10 MG tablet Take 1 tablet (10 mg total) by mouth daily. 90 tablet 1   aspirin  81 MG chewable tablet Chew 81 mg by mouth daily.     Budeson-Glycopyrrol-Formoterol  (BREZTRI  AEROSPHERE) 160-9-4.8 MCG/ACT AERO Inhale 2 puffs into the lungs in the morning and at bedtime. 3 each 3   buPROPion  (WELLBUTRIN  SR) 100 MG 12 hr tablet TAKE 1 TABLET BY MOUTH TWICE A DAY 180 tablet  2   ezetimibe  (ZETIA ) 10 MG tablet Take 1 tablet (10 mg total) by mouth daily. 90 tablet 0   ferrous sulfate 325 (65 FE) MG tablet Take 325 mg by mouth daily.     metFORMIN (GLUCOPHAGE) 500 MG tablet      metoprolol  succinate (TOPROL -XL) 50 MG 24 hr tablet Take 1 tablet (50 mg total) by mouth daily. 90 tablet 3   rosuvastatin  (CRESTOR ) 40 MG tablet Take 1 tablet (40 mg total) by mouth daily. 30 tablet 5    varenicline  (CHANTIX ) 0.5 MG tablet Take 1 tablet (0.5 mg total) by mouth 2 (two) times daily. 120 tablet 2   No current facility-administered medications for this visit.    SURGICAL HISTORY:  Past Surgical History:  Procedure Laterality Date   BRONCHOSCOPY Right 07/26/2019   VIDEO BRONCHOSCOPY WITH ENDOBRONCHIAL NAVIGATION (N/A )    COLONOSCOPY  12/ 2009   ENDOBRONCHIAL ULTRASOUND Bilateral 04/08/2014   Procedure: ENDOBRONCHIAL ULTRASOUND;  Surgeon: Francis CHRISTELLA Dresser, MD;  Location: [facility];  Service: Cardiopulmonary;  Laterality: Bilateral;   INTERCOSTAL NERVE BLOCK Right 07/26/2019   Procedure: Intercostal Nerve Block;  Surgeon: Kerrin Elspeth BROCKS, MD;  Location: [facility] OR;  Service: Thoracic;  Laterality: Right;   NODE DISSECTION Right 07/26/2019   Procedure: Node Dissection;  Surgeon: Kerrin Elspeth BROCKS, MD;  Location: [facility] OR;  Service: Thoracic;  Laterality: Right;   RADIOACTIVE SEED IMPLANT N/A 04/03/2015   Procedure: RADIOACTIVE SEED IMPLANT/BRACHYTHERAPY IMPLANT;  Surgeon: Norleen Seltzer, MD;  Location: [facility];  Service: Urology;  Laterality: N/A;   VIDEO ASSISTED THORACOSCOPY (VATS)/ LOBECTOMY Left 09/09/2014   Procedure: LEFT VIDEO ASSISTED THORACOSCOPY (VATS) with LEFT UPPER LOBECTOMY;  Surgeon: Elspeth BROCKS Kerrin, MD;  Location: [facility] OR;  Service: Thoracic;  Laterality: Left;   VIDEO BRONCHOSCOPY WITH ENDOBRONCHIAL NAVIGATION Right 04/24/2014   Procedure: VIDEO BRONCHOSCOPY WITH ENDOBRONCHIAL NAVIGATION;  Surgeon: Lamar GORMAN Chris, MD;  Location: [facility] OR;  Service: Thoracic;  Laterality: Right;   VIDEO BRONCHOSCOPY WITH ENDOBRONCHIAL NAVIGATION N/A 07/26/2019   Procedure: VIDEO BRONCHOSCOPY WITH ENDOBRONCHIAL NAVIGATION;  Surgeon: Kerrin Elspeth BROCKS, MD;  Location: [facility] OR;  Service: Thoracic;  Laterality: N/A;   VIDEO BRONCHOSCOPY WITH ENDOBRONCHIAL ULTRASOUND Right 04/24/2014   Procedure: VIDEO BRONCHOSCOPY WITH ENDOBRONCHIAL ULTRASOUND;  Surgeon: Lamar GORMAN Chris,  MD;  Location: [facility] OR;  Service: Thoracic;  Laterality: Right;   WEDGE RESECTION  07/26/2019    REVIEW OF SYSTEMS:  A comprehensive review of systems was negative except for: Respiratory: positive for dyspnea on exertion   PHYSICAL EXAMINATION: General appearance: alert, cooperative, fatigued and no distress Head: Normocephalic, without obvious abnormality, atraumatic Neck: no adenopathy, no JVD, supple, symmetrical, trachea midline and thyroid not enlarged, symmetric, no tenderness/mass/nodules Lymph nodes: Cervical, supraclavicular, and axillary nodes normal. Resp: wheezes bilaterally Back: symmetric, no curvature. ROM normal. No CVA tenderness. Cardio: regular rate and rhythm, S1, S2 normal, no murmur, click, rub or gallop GI: soft, non-tender; bowel sounds normal; no masses,  no organomegaly Extremities: extremities normal, atraumatic, no cyanosis or edema  ECOG PERFORMANCE STATUS: 1 - Symptomatic but completely ambulatory  Blood pressure (!) 145/75, pulse 86, temperature 97.8 F (36.6 C), temperature source Temporal, resp. rate 17, height 5' 11 (1.803 m), weight 205 lb (93 kg), SpO2 98%.  LABORATORY DATA: Lab Results  Component Value Date   WBC 8.2 12/26/2023   HGB 12.1 (L) 12/26/2023   HCT 35.4 (L) 12/26/2023   MCV 76.0 (L) 12/26/2023   PLT 225 12/26/2023  Chemistry      Component Value Date/Time   NA 137 12/26/2023 0829   NA 139 05/25/2017 0734   K 4.0 12/26/2023 0829   K 3.7 05/25/2017 0734   CL 102 12/26/2023 0829   CO2 28 12/26/2023 0829   CO2 23 05/25/2017 0734   BUN 9 12/26/2023 0829   BUN 10.2 05/25/2017 0734   CREATININE 0.85 12/26/2023 0829   CREATININE 0.7 05/25/2017 0734      Component Value Date/Time   CALCIUM  9.3 12/26/2023 0829   CALCIUM  9.5 05/25/2017 0734   ALKPHOS 70 12/26/2023 0829   ALKPHOS 80 05/25/2017 0734   AST 18 12/26/2023 0829   AST 18 05/25/2017 0734   ALT 15 12/26/2023 0829   ALT 17 05/25/2017 0734   BILITOT 0.3 12/26/2023  0829   BILITOT 0.29 05/25/2017 0734       RADIOGRAPHIC STUDIES: CT Chest W Contrast Result Date: 12/26/2023 CLINICAL DATA:  Non-small-cell lung cancer restaging, assess treatment response * Tracking Code: BO * EXAM: CT CHEST WITH CONTRAST TECHNIQUE: Multidetector CT imaging of the chest was performed during intravenous contrast administration. RADIATION DOSE REDUCTION: This exam was performed according to the departmental dose-optimization program which includes automated exposure control, adjustment of the mA and/or kV according to patient size and/or use of iterative reconstruction technique. CONTRAST:  75mL OMNIPAQUE  IOHEXOL  300 MG/ML  SOLN COMPARISON:  01/13/2023 FINDINGS: Cardiovascular: Aortic atherosclerosis. Normal heart size. Three-vessel coronary artery calcifications. No pericardial effusion. Mediastinum/Nodes: No enlarged mediastinal, hilar, or axillary lymph nodes. Thyroid gland, trachea, and esophagus demonstrate no significant findings. Lungs/Pleura: Unchanged postoperative appearance of the chest with wedge resection of the lateral segment right middle lobe and superior segment right lower lobe. Underlying severe, bullous emphysema. No pleural effusion or pneumothorax. Upper Abdomen: No acute abnormality. Musculoskeletal: No chest wall abnormality. No acute osseous findings. IMPRESSION: 1. Unchanged postoperative appearance of the chest with wedge resection of the lateral segment right middle lobe and superior segment right lower lobe. 2. No evidence of recurrent or metastatic disease in the chest. 3. Underlying severe, bullous emphysema. 4. Coronary artery disease. Aortic Atherosclerosis (ICD10-I70.0) and Emphysema (ICD10-J43.9). Electronically Signed   By: Marolyn JONETTA Jaksch M.D.   On: 12/26/2023 12:26     ASSESSMENT AND PLAN:  This is a very pleasant 73 years old African-American male with a stage IIA non-small cell lung cancer, squamous cell carcinoma status post neoadjuvant systemic  chemotherapy followed by left upper lobectomy and lymph node dissection in April 2016. The patient has been on observation since that time. Repeat restaging CT scan of the chest showed enlarging spiculated nodule in the superior segment of the right lower lobe highly suspicious for recurrent adenocarcinoma.  There was also a partially solid nodule in the right middle lobe with slowly increased size also suspicious for low-grade adenocarcinoma. His PET scan showed low hypermetabolic activity in the suspicious nodule but still suspicious for slowly growing adenocarcinoma. He underwent surgical resection of the right lower lobe superior segmentectomy as well as right middle lobe wedge resection under the care of Dr. Kerrin in February 2021. The patient had repeat CT scan of the chest performed recently.  I personally and independently reviewed the scan discussed the results with the patient today.  His scan showed no concerning findings for disease recurrence or metastasis. Assessment and Plan Assessment & Plan History of lung cancer, status post multiple pulmonary resections Stage 2A non-small cell lung cancer diagnosed in November 2015. Underwent neoadjuvant systemic chemotherapy followed by left upper  lobectomy and lymph node dissection in April 2016. Right lower lobe superior segmentectomy and right middle lobe wedge dissection for recurrence in February 2021. Recent CT scan shows no evidence of disease recurrence or metastasis. No new symptoms such as chest pain, hemoptysis, or weight loss. Condition is well-managed with no signs of active disease. - Continue observation - Schedule follow-up CT scan and blood work in one year He was advised to call immediately if he has any other concerning symptoms in the interval.  The patient voices understanding of current disease status and treatment options and is in agreement with the current care plan. All questions were answered. The patient knows to  call the clinic with any problems, questions or concerns. We can certainly see the patient much sooner if necessary.  Disclaimer: This note was dictated with voice recognition software. Similar sounding words can inadvertently be transcribed and may not be corrected upon review.

## 2024-02-08 ENCOUNTER — Ambulatory Visit
Admission: RE | Admit: 2024-02-08 | Discharge: 2024-02-08 | Disposition: A | Discharge status: Home / Self Care | Source: Ambulatory Visit | Attending: Internal Medicine | Admitting: Internal Medicine

## 2024-02-08 DIAGNOSIS — I714 Abdominal aortic aneurysm, without rupture, unspecified: Secondary | ICD-10-CM

## 2024-02-22 ENCOUNTER — Ambulatory Visit (INDEPENDENT_AMBULATORY_CARE_PROVIDER_SITE_OTHER): Admitting: Podiatry

## 2024-02-22 DIAGNOSIS — M79671 Pain in right foot: Secondary | ICD-10-CM

## 2024-02-22 DIAGNOSIS — B351 Tinea unguium: Secondary | ICD-10-CM | POA: Diagnosis not present

## 2024-02-22 DIAGNOSIS — M79672 Pain in left foot: Secondary | ICD-10-CM | POA: Diagnosis not present

## 2024-02-22 NOTE — Progress Notes (Signed)
 Patient presents for evaluation and treatment of tenderness and some redness around nails feet.  Tenderness around toes with walking and wearing shoes.  Physical exam:  General appearance: Alert, pleasant, and in no acute distress.  Vascular: Pedal pulses: DP 1/4 B/L, PT 0/4 B/L. Moderate edema lower legs bilaterally  Neu  Dermatologic:  Nails thickened, disfigured, discolored 1-5 BL with subungual debris.  Redness and hypertrophic nail folds along nail folds bilaterally but no signs of drainage or infection.  Musculoskeletal:     Diagnosis: 1. Painful onychomycotic nails 1 through 5 bilaterally. 2. Pain toes 1 through 5 bilaterally.  Plan: -Debrided onychomycotic nails 1 through 5 bilaterally.  Sharply debrided nails with nail clipper and reduced with a power bur.  Return 3 months

## 2024-05-22 ENCOUNTER — Ambulatory Visit: Admitting: Podiatry

## 2024-05-22 ENCOUNTER — Encounter: Payer: Self-pay | Admitting: Podiatry

## 2024-05-22 VITALS — Ht 71.0 in | Wt 205.0 lb

## 2024-05-22 DIAGNOSIS — M79671 Pain in right foot: Secondary | ICD-10-CM | POA: Diagnosis not present

## 2024-05-22 DIAGNOSIS — B351 Tinea unguium: Secondary | ICD-10-CM | POA: Diagnosis not present

## 2024-05-22 DIAGNOSIS — M79672 Pain in left foot: Secondary | ICD-10-CM

## 2024-05-22 NOTE — Progress Notes (Signed)
 Patient presents for evaluation and treatment of tenderness and some redness around nails feet.  Tenderness around toes with walking and wearing shoes.  Physical exam:  General appearance: Alert, pleasant, and in no acute distress.  Vascular: Pedal pulses: DP 1/4 B/L, PT 0/4 B/L.  Moderate edema lower legs bilaterally.  Capillary refill time immediate bilaterally  Neurologic:  Dermatologic:  Nails thickened, disfigured, discolored 1-5 BL with subungual debris.  Redness and hypertrophic nail folds along nail folds bilaterally but no signs of drainage or infection.  Musculoskeletal:     Diagnosis: 1. Painful onychomycotic nails 1 through 5 bilaterally. 2. Pain toes 1 through 5 bilaterally.  Plan: -Debrided onychomycotic nails 1 through 5 bilaterally.  Sharply debrided nails with nail clipper and reduced with a power bur.  Return 3 months RFC

## 2024-08-20 ENCOUNTER — Ambulatory Visit: Admitting: Podiatry

## 2024-12-24 ENCOUNTER — Other Ambulatory Visit

## 2025-01-03 ENCOUNTER — Ambulatory Visit: Admitting: Internal Medicine
